# Patient Record
Sex: Male | Born: 1970 | Race: White | Hispanic: No | Marital: Married | Smoking: Current every day smoker
Health system: Southern US, Community
[De-identification: ages and names within clinical notes are randomized; demographics above are authoritative.]

## PROBLEM LIST (undated history)

## (undated) DIAGNOSIS — E785 Hyperlipidemia, unspecified: Secondary | ICD-10-CM

## (undated) DIAGNOSIS — Z720 Tobacco use: Secondary | ICD-10-CM

## (undated) DIAGNOSIS — I2510 Atherosclerotic heart disease of native coronary artery without angina pectoris: Secondary | ICD-10-CM

## (undated) DIAGNOSIS — G8929 Other chronic pain: Secondary | ICD-10-CM

## (undated) DIAGNOSIS — I255 Ischemic cardiomyopathy: Secondary | ICD-10-CM

## (undated) DIAGNOSIS — M1990 Unspecified osteoarthritis, unspecified site: Secondary | ICD-10-CM

## (undated) DIAGNOSIS — I739 Peripheral vascular disease, unspecified: Secondary | ICD-10-CM

## (undated) DIAGNOSIS — I10 Essential (primary) hypertension: Secondary | ICD-10-CM

## (undated) DIAGNOSIS — F319 Bipolar disorder, unspecified: Secondary | ICD-10-CM

## (undated) DIAGNOSIS — K279 Peptic ulcer, site unspecified, unspecified as acute or chronic, without hemorrhage or perforation: Secondary | ICD-10-CM

## (undated) DIAGNOSIS — F209 Schizophrenia, unspecified: Secondary | ICD-10-CM

## (undated) DIAGNOSIS — K219 Gastro-esophageal reflux disease without esophagitis: Secondary | ICD-10-CM

## (undated) DIAGNOSIS — M549 Dorsalgia, unspecified: Secondary | ICD-10-CM

## (undated) HISTORY — PX: HERNIA REPAIR: SHX51

## (undated) HISTORY — PX: TONSILLECTOMY: SUR1361

---

## 2008-06-25 ENCOUNTER — Ambulatory Visit: Payer: Self-pay | Admitting: Cardiology

## 2009-03-23 ENCOUNTER — Ambulatory Visit (HOSPITAL_COMMUNITY): Admission: RE | Admit: 2009-03-23 | Discharge: 2009-03-23 | Payer: Self-pay | Admitting: Neurosurgery

## 2009-07-12 ENCOUNTER — Other Ambulatory Visit: Payer: Self-pay | Admitting: Emergency Medicine

## 2009-07-13 ENCOUNTER — Inpatient Hospital Stay (HOSPITAL_COMMUNITY): Admission: RE | Admit: 2009-07-13 | Discharge: 2009-07-14 | Payer: Self-pay | Admitting: Psychiatry

## 2009-07-13 ENCOUNTER — Ambulatory Visit: Payer: Self-pay | Admitting: Psychiatry

## 2010-04-25 LAB — COMPREHENSIVE METABOLIC PANEL
AST: 22 U/L (ref 0–37)
Albumin: 3.1 g/dL — ABNORMAL LOW (ref 3.5–5.2)
BUN: 13 mg/dL (ref 6–23)
Calcium: 8.6 mg/dL (ref 8.4–10.5)
Creatinine, Ser: 1.27 mg/dL (ref 0.4–1.5)
GFR calc Af Amer: >60 mL/min (ref >60)
Glucose, Bld: 84 mg/dL (ref 70–99)
Total Bilirubin: 0.2 mg/dL — ABNORMAL LOW (ref 0.3–1.2)

## 2010-04-25 LAB — CBC
Hemoglobin: 13.9 g/dL (ref 13.0–17.0)
MCHC: 34.9 g/dL (ref 30.0–36.0)
MCV: 89.6 fL (ref 78.0–100.0)
Platelets: 189 10*3/uL (ref 150–400)
RBC: 4.45 MIL/uL (ref 4.22–5.81)
WBC: 5.1 10*3/uL (ref 4.0–10.5)

## 2010-04-25 LAB — DIFFERENTIAL
Eosinophils Relative: 3 % (ref 0–5)
Lymphocytes Relative: 42 % (ref 12–46)
Lymphs Abs: 2.2 10*3/uL (ref 0.7–4.0)
Monocytes Absolute: 0.5 10*3/uL (ref 0.1–1.0)
Monocytes Relative: 10 % (ref 3–12)

## 2010-04-25 LAB — RAPID URINE DRUG SCREEN, HOSP PERFORMED
Barbiturates: NOT DETECTED
Opiates: NOT DETECTED
Tetrahydrocannabinol: POSITIVE — AB

## 2013-02-16 ENCOUNTER — Emergency Department (HOSPITAL_COMMUNITY): Payer: Medicaid Other

## 2013-02-16 ENCOUNTER — Emergency Department (HOSPITAL_COMMUNITY)
Admission: EM | Admit: 2013-02-16 | Discharge: 2013-02-17 | Disposition: A | Discharge status: Home / Self Care | Payer: Medicaid Other | Attending: Emergency Medicine | Admitting: Emergency Medicine

## 2013-02-16 ENCOUNTER — Encounter (HOSPITAL_COMMUNITY): Payer: Self-pay | Admitting: Emergency Medicine

## 2013-02-16 DIAGNOSIS — F17200 Nicotine dependence, unspecified, uncomplicated: Secondary | ICD-10-CM | POA: Insufficient documentation

## 2013-02-16 DIAGNOSIS — I10 Essential (primary) hypertension: Secondary | ICD-10-CM | POA: Insufficient documentation

## 2013-02-16 DIAGNOSIS — R109 Unspecified abdominal pain: Secondary | ICD-10-CM

## 2013-02-16 DIAGNOSIS — R112 Nausea with vomiting, unspecified: Secondary | ICD-10-CM

## 2013-02-16 DIAGNOSIS — K279 Peptic ulcer, site unspecified, unspecified as acute or chronic, without hemorrhage or perforation: Secondary | ICD-10-CM

## 2013-02-16 DIAGNOSIS — Z8659 Personal history of other mental and behavioral disorders: Secondary | ICD-10-CM | POA: Insufficient documentation

## 2013-02-16 HISTORY — DX: Other chronic pain: M54.9

## 2013-02-16 HISTORY — DX: Other chronic pain: G89.29

## 2013-02-16 HISTORY — DX: Schizophrenia, unspecified: F20.9

## 2013-02-16 HISTORY — DX: Essential (primary) hypertension: I10

## 2013-02-16 HISTORY — DX: Bipolar disorder, unspecified: F31.9

## 2013-02-16 LAB — COMPREHENSIVE METABOLIC PANEL
ALBUMIN: 3.4 g/dL — AB (ref 3.5–5.2)
ALT: 32 U/L (ref 0–53)
AST: 21 U/L (ref 0–37)
Alkaline Phosphatase: 106 U/L (ref 39–117)
BILIRUBIN TOTAL: 0.3 mg/dL (ref 0.3–1.2)
BUN: 11 mg/dL (ref 6–23)
CALCIUM: 8.9 mg/dL (ref 8.4–10.5)
CHLORIDE: 103 meq/L (ref 96–112)
CO2: 24 meq/L (ref 19–32)
Creatinine, Ser: 1.08 mg/dL (ref 0.50–1.35)
GFR, EST NON AFRICAN AMERICAN: 84 mL/min — AB (ref >90)
Glucose, Bld: 114 mg/dL — ABNORMAL HIGH (ref 70–99)
Potassium: 3.9 mEq/L (ref 3.7–5.3)
SODIUM: 139 meq/L (ref 137–147)
TOTAL PROTEIN: 7 g/dL (ref 6.0–8.3)

## 2013-02-16 LAB — CBC WITH DIFFERENTIAL/PLATELET
BASOS ABS: 0 10*3/uL (ref 0.0–0.1)
BASOS PCT: 0 % (ref 0–1)
EOS ABS: 0.2 10*3/uL (ref 0.0–0.7)
EOS PCT: 2 % (ref 0–5)
HCT: 44.8 % (ref 39.0–52.0)
Hemoglobin: 15.4 g/dL (ref 13.0–17.0)
LYMPHS PCT: 13 % (ref 12–46)
Lymphs Abs: 1.7 10*3/uL (ref 0.7–4.0)
MCH: 31.3 pg (ref 26.0–34.0)
MCHC: 34.4 g/dL (ref 30.0–36.0)
MCV: 91.1 fL (ref 78.0–100.0)
MONO ABS: 0.7 10*3/uL (ref 0.1–1.0)
Monocytes Relative: 5 % (ref 3–12)
Neutro Abs: 10.3 10*3/uL — ABNORMAL HIGH (ref 1.7–7.7)
Neutrophils Relative %: 79 % — ABNORMAL HIGH (ref 43–77)
Platelets: 246 10*3/uL (ref 150–400)
RBC: 4.92 MIL/uL (ref 4.22–5.81)
RDW: 12.9 % (ref 11.5–15.5)
WBC: 12.9 10*3/uL — AB (ref 4.0–10.5)

## 2013-02-16 LAB — LIPASE, BLOOD: Lipase: 23 U/L (ref 11–59)

## 2013-02-16 MED ORDER — ONDANSETRON HCL 4 MG/2ML IJ SOLN
4.0000 mg | INTRAMUSCULAR | Status: DC | PRN
  Administered 2013-02-16: 4 mg via INTRAVENOUS
  Filled 2013-02-16: qty 2

## 2013-02-16 MED ORDER — SODIUM CHLORIDE 0.9 % IV BOLUS (SEPSIS)
500.0000 mL | Freq: Once | INTRAVENOUS | Status: AC
  Administered 2013-02-16: 500 mL via INTRAVENOUS

## 2013-02-16 MED ORDER — PANTOPRAZOLE SODIUM 40 MG IV SOLR
40.0000 mg | Freq: Once | INTRAVENOUS | Status: AC
  Administered 2013-02-16: 40 mg via INTRAVENOUS
  Filled 2013-02-16: qty 40

## 2013-02-16 MED ORDER — SODIUM CHLORIDE 0.9 % IV SOLN
INTRAVENOUS | Status: DC
  Administered 2013-02-16: 23:00:00 via INTRAVENOUS

## 2013-02-16 MED ORDER — FAMOTIDINE IN NACL 20-0.9 MG/50ML-% IV SOLN
20.0000 mg | Freq: Once | INTRAVENOUS | Status: AC
  Administered 2013-02-16: 20 mg via INTRAVENOUS
  Filled 2013-02-16: qty 50

## 2013-02-16 NOTE — ED Notes (Signed)
Patient complaining of abdominal pain and vomiting x 2-3 days. States vomiting "black looking" emesis today.

## 2013-02-16 NOTE — ED Provider Notes (Signed)
CSN: [id number]     Arrival date & time 02/16/13  2149 History   First MD Initiated Contact with Patient 02/16/13 2205     Chief Complaint  Patient presents with  . Abdominal Pain  . Emesis    HPI Pt was seen at 2205.  Per pt, c/o gradual onset and persistence of multiple intermittent episodes of N/V for the past 2 to 3 days. Has been associated with upper abd "pain." Describes the emesis as "black." Describes the abd pain as "aching." States he was evaluated at [facility], [place] ED today for same, "but they didn't do anything or tell me what I had wrong with me," so he came to [facility] ED tonight. Denies CP/SOB, no back pain, no fevers, no black or blood in stools, no red blood in emesis.     Past Medical History  Diagnosis Date  . Bipolar 1 disorder   . Schizophrenic disorder   . Hypertension   . Chronic back pain    Past Surgical History  Procedure Laterality Date  . Hernia repair      History  Substance Use Topics  . Smoking status: Current Every Day Smoker  . Smokeless tobacco: Not on file  . Alcohol Use: No    Review of Systems ROS: Statement: All systems negative except as marked or noted in the HPI; Constitutional: Negative for fever and chills. ; ; Eyes: Negative for eye pain, redness and discharge. ; ; ENMT: Negative for ear pain, hoarseness, nasal congestion, sinus pressure and sore throat. ; ; Cardiovascular: Negative for chest pain, palpitations, diaphoresis, dyspnea and peripheral edema. ; ; Respiratory: Negative for cough, wheezing and stridor. ; ; Gastrointestinal: +N/V, abd pain, "black" emesis. Negative for diarrhea, blood in stool, hematemesis, jaundice and rectal bleeding. . ; ; Genitourinary: Negative for dysuria, flank pain and hematuria. ; ; Musculoskeletal: Negative for back pain and neck pain. Negative for swelling and trauma.; ; Skin: Negative for pruritus, rash, abrasions, blisters, bruising and skin lesion.; ; Neuro: Negative for headache, lightheadedness and  neck stiffness. Negative for weakness, altered level of consciousness , altered mental status, extremity weakness, paresthesias, involuntary movement, seizure and syncope.      Allergies  Seroquel and Tylenol pm extra  Home Medications  No current outpatient prescriptions on file. BP 154/97  Pulse 119  Temp(Src) 97.5 F (36.4 C) (Oral)  Resp 20  Ht 5\' 7"  (1.702 m)  Wt 200 lb (90.719 kg)  BMI 31.32 kg/m2  SpO2 97% Physical Exam 2210: Physical examination:  Nursing notes reviewed; Vital signs and O2 SAT reviewed;  Constitutional: Well developed, Well nourished, Well hydrated, In no acute distress; Head:  Normocephalic, atraumatic; Eyes: EOMI, PERRL, No scleral icterus; ENMT: Mouth and pharynx normal, Mucous membranes moist; Neck: Supple, Full range of motion, No lymphadenopathy; Cardiovascular: Regular rate and rhythm, No murmur, rub, or gallop; Respiratory: Breath sounds clear & equal bilaterally, No wheezes. Speaking full sentences with ease, Normal respiratory effort/excursion; Chest: Nontender, Movement normal; Abdomen: Soft, +mild mid-epigastric tenderness to palp. No rebound or guarding. Nondistended, Normal bowel sounds. Rectal exam performed w/permission of pt and ED RN chaperone present.  Anal tone normal.  Non-tender, soft brown stool in rectal vault, heme neg.  No fissures, no external hemorrhoids, no palp masses.; Genitourinary: No CVA tenderness; Extremities: Pulses normal, No tenderness, No edema, No calf edema or asymmetry.; Neuro: AA&Ox3, Major CN grossly intact.  Speech clear. No gross focal motor or sensory deficits in extremities.; Skin: Color normal, Warm, Dry.  ED Course  Procedures  EKG Interpretation   None       MDM  MDM Reviewed: previous chart, nursing note and vitals Reviewed previous: labs Interpretation: labs and x-ray     Results for orders placed during the hospital encounter of 02/16/13  CBC WITH DIFFERENTIAL      Result Value Range   WBC 12.9  (*) 4.0 - 10.5 K/uL   RBC 4.92  4.22 - 5.81 MIL/uL   Hemoglobin 15.4  13.0 - 17.0 g/dL   HCT 04.5  40.9 - 81.1 %   MCV 91.1  78.0 - 100.0 fL   MCH 31.3  26.0 - 34.0 pg   MCHC 34.4  30.0 - 36.0 g/dL   RDW 91.4  78.2 - 95.6 %   Platelets 246  150 - 400 K/uL   Neutrophils Relative % 79 (*) 43 - 77 %   Neutro Abs 10.3 (*) 1.7 - 7.7 K/uL   Lymphocytes Relative 13  12 - 46 %   Lymphs Abs 1.7  0.7 - 4.0 K/uL   Monocytes Relative 5  3 - 12 %   Monocytes Absolute 0.7  0.1 - 1.0 K/uL   Eosinophils Relative 2  0 - 5 %   Eosinophils Absolute 0.2  0.0 - 0.7 K/uL   Basophils Relative 0  0 - 1 %   Basophils Absolute 0.0  0.0 - 0.1 K/uL  COMPREHENSIVE METABOLIC PANEL      Result Value Range   Sodium 139  137 - 147 mEq/L   Potassium 3.9  3.7 - 5.3 mEq/L   Chloride 103  96 - 112 mEq/L   CO2 24  19 - 32 mEq/L   Glucose, Bld 114 (*) 70 - 99 mg/dL   BUN 11  6 - 23 mg/dL   Creatinine, Ser 2.13  0.50 - 1.35 mg/dL   Calcium 8.9  8.4 - 08.6 mg/dL   Total Protein 7.0  6.0 - 8.3 g/dL   Albumin 3.4 (*) 3.5 - 5.2 g/dL   AST 21  0 - 37 U/L   ALT 32  0 - 53 U/L   Alkaline Phosphatase 106  39 - 117 U/L   Total Bilirubin 0.3  0.3 - 1.2 mg/dL   GFR calc non Af Amer 84 (*) >90 mL/min   GFR calc Af Amer >90  >90 mL/min  LIPASE, BLOOD      Result Value Range   Lipase 23  11 - 59 U/L   Dg Abd Acute W/chest 02/16/2013   CLINICAL DATA:  2-3 day history of abdominal pain and vomiting.  EXAM: ACUTE ABDOMEN SERIES (ABDOMEN 2 VIEW & CHEST 1 VIEW)  COMPARISON:  Two-view chest x-ray earlier same date [facility]. Acute abdomen series 09/19/2012 [facility].  FINDINGS: Bowel gas pattern unremarkable without evidence of obstruction or significant ileus. No evidence of free air or significant air-fluid levels on the erect image. Moderate stool burden. Phleboliths in the right side of the pelvis. No visible opaque urinary tract calculi. Visible psoas margins. Regional skeleton intact.   Suboptimal inspiration accounts for crowded bronchovascular markings diffusely and atelectasis in the bases, and accentuates the cardiac silhouette. Taking this into account, cardiomediastinal silhouette unremarkable. Lungs otherwise clear.  IMPRESSION: 1. No acute abdominal abnormality. 2. Suboptimal inspiration accounts for bibasilar atelectasis. No acute cardiopulmonary disease otherwise.   Electronically Signed   By: Hulan Saas M.D.   On: 02/16/2013 23:21    0015:  N/V x1 on arrival  to ED, dark emesis was heme positive. Stool in rectal vault was heme negative. IV pepcid and protonix given. IV zofran given for nausea. AXR without acute findings. H/H stable. Awaiting [facility] ED records from today for comparison. Will attempt PO fluid challenge. Sign out to Dr. Hyacinth MeekerMiller.    Laray AngerKathleen M Coumba Kellison, DO 02/17/13 0025

## 2013-02-17 ENCOUNTER — Emergency Department (HOSPITAL_COMMUNITY): Payer: Medicaid Other

## 2013-02-17 LAB — OCCULT BLOOD, POC DEVICE: Fecal Occult Bld: NEGATIVE

## 2013-02-17 MED ORDER — ONDANSETRON 4 MG PO TBDP: 4.0000 mg | ORAL_TABLET | Freq: Three times a day (TID) | ORAL | Status: DC | PRN

## 2013-02-17 MED ORDER — IOHEXOL 300 MG/ML  SOLN
50.0000 mL | Freq: Once | INTRAMUSCULAR | Status: AC | PRN
  Administered 2013-02-17: 50 mL via ORAL

## 2013-02-17 MED ORDER — PROMETHAZINE HCL 25 MG/ML IJ SOLN
25.0000 mg | Freq: Once | INTRAMUSCULAR | Status: AC
  Administered 2013-02-17: 25 mg via INTRAVENOUS
  Filled 2013-02-17: qty 1

## 2013-02-17 MED ORDER — RANITIDINE HCL 150 MG PO CAPS: 150.0000 mg | ORAL_CAPSULE | Freq: Two times a day (BID) | ORAL | Status: DC

## 2013-02-17 MED ORDER — IOHEXOL 300 MG/ML  SOLN
100.0000 mL | Freq: Once | INTRAMUSCULAR | Status: AC | PRN
  Administered 2013-02-17: 100 mL via INTRAVENOUS

## 2013-02-17 MED ORDER — MORPHINE SULFATE 4 MG/ML IJ SOLN
4.0000 mg | Freq: Once | INTRAMUSCULAR | Status: AC
  Administered 2013-02-17: 4 mg via INTRAVENOUS
  Filled 2013-02-17: qty 1

## 2013-02-17 MED ORDER — OMEPRAZOLE 20 MG PO CPDR: 20.0000 mg | DELAYED_RELEASE_CAPSULE | Freq: Every day | ORAL | Status: DC

## 2013-02-17 MED ORDER — SUCRALFATE 1 G PO TABS: 1.0000 g | ORAL_TABLET | Freq: Three times a day (TID) | ORAL | Status: DC

## 2013-02-17 NOTE — ED Provider Notes (Signed)
1:00 AM  Patient evaluated, change of shift, care accepted from Dr. Clarene DukeMcManus,  43 year old male, states that he has a history of peptic ulcer disease that was diagnosed clinically, he takes Zantac twice a day and avoid anti-inflammatory medications including aspirin. He also reports a distant history of blood clots both in his leg and his stomach, he is unsure of the circumstances surrounding this but notes that he was in the hospital for 2 weeks in [place] getting treatment for this. He no longer takes any anticoagulants. He has had several days of nausea and vomiting and has noticed a dark colored emesis, this appeared to be coffee grounds on arrival, the patient was given antiemetics and improved significantly and now has no nausea or vomiting. He has persistent epigastric pain and on my exam has tenderness in the epigastrium as well as mild less intense tenderness in the lower abdomen bilaterally. He has no guarding and no peritoneal signs. Laboratory workup shows a leukocytosis, LFTs and lipase are normal, CT scan of the abdomen and pelvis is pending to evaluate for other sources of this abdominal pain with a leukocytosis though it could just be peptic ulcer disease. At this time the patient appears hemodynamically stable and does not have a surgical abdomen.  1:30 AM  Medical records have now been accessed from the outside hospital. They have been faxed, I have reviewed the records which show that the patient was seen several times this week, initially for bronchitis type symptoms, given Zithromax, was having difficulty tolerating this medication because of vomiting. He was seen again earlier approximately 12 hours ago at that hospital and treated for nausea and vomiting.  Lab work at that time showed that he had a normal urinalysis, normal white blood cell count, normal metabolic panel without anion gap acidosis.  03:00 -   CT negative for acute findings - pt given phenergan for some recurrent  nausea - he has become very sleepy with the medications - gradually waking up - denies nausea or pain when asked.  Appears stable for d/c - will maximize antacid meds.  Records from OSH show that he has been taking a PPI, not zantac, will cover with PPI, H2 and carafate.  GI f/u - he states he doesn't have a GI specialist - states family doctor is Dr. Loney HeringBluth  6 AM, patient is awake, ambulatory, symptoms improved, home the following medications   Meds given in ED:    New Prescriptions   OMEPRAZOLE (PRILOSEC) 20 MG CAPSULE    Take 1 capsule (20 mg total) by mouth daily.   ONDANSETRON (ZOFRAN ODT) 4 MG DISINTEGRATING TABLET    Take 1 tablet (4 mg total) by mouth every 8 (eight) hours as needed for nausea.   RANITIDINE (ZANTAC) 150 MG CAPSULE    Take 1 capsule (150 mg total) by mouth 2 (two) times daily.   SUCRALFATE (CARAFATE) 1 G TABLET    Take 1 tablet (1 g total) by mouth 4 (four) times daily -  with meals and at bedtime.      Vida RollerBrian D Kenidy Crossland, MD 02/17/13 [phone]

## 2013-02-17 NOTE — Discharge Instructions (Signed)
Please call your doctor for a followup appointment within 24-48 hours. When you talk to your doctor please let them know that you were seen in the emergency department and have them acquire all of your records so that they can discuss the findings with you and formulate a treatment plan to fully care for your new and ongoing problems. ° °

## 2013-05-04 ENCOUNTER — Encounter (HOSPITAL_COMMUNITY): Payer: Self-pay | Admitting: Emergency Medicine

## 2013-05-04 ENCOUNTER — Emergency Department (HOSPITAL_COMMUNITY)
Admission: EM | Admit: 2013-05-04 | Discharge: 2013-05-04 | Disposition: A | Discharge status: Home / Self Care | Payer: Medicaid Other | Attending: Emergency Medicine | Admitting: Emergency Medicine

## 2013-05-04 DIAGNOSIS — F17200 Nicotine dependence, unspecified, uncomplicated: Secondary | ICD-10-CM | POA: Insufficient documentation

## 2013-05-04 DIAGNOSIS — S00251A Superficial foreign body of right eyelid and periocular area, initial encounter: Secondary | ICD-10-CM

## 2013-05-04 DIAGNOSIS — S0501XA Injury of conjunctiva and corneal abrasion without foreign body, right eye, initial encounter: Secondary | ICD-10-CM

## 2013-05-04 DIAGNOSIS — Y9289 Other specified places as the place of occurrence of the external cause: Secondary | ICD-10-CM | POA: Insufficient documentation

## 2013-05-04 DIAGNOSIS — IMO0002 Reserved for concepts with insufficient information to code with codable children: Secondary | ICD-10-CM | POA: Insufficient documentation

## 2013-05-04 DIAGNOSIS — I10 Essential (primary) hypertension: Secondary | ICD-10-CM | POA: Insufficient documentation

## 2013-05-04 DIAGNOSIS — G8929 Other chronic pain: Secondary | ICD-10-CM | POA: Insufficient documentation

## 2013-05-04 DIAGNOSIS — Y939 Activity, unspecified: Secondary | ICD-10-CM | POA: Insufficient documentation

## 2013-05-04 DIAGNOSIS — T1510XA Foreign body in conjunctival sac, unspecified eye, initial encounter: Secondary | ICD-10-CM | POA: Insufficient documentation

## 2013-05-04 DIAGNOSIS — S058X9A Other injuries of unspecified eye and orbit, initial encounter: Secondary | ICD-10-CM | POA: Insufficient documentation

## 2013-05-04 DIAGNOSIS — Z8659 Personal history of other mental and behavioral disorders: Secondary | ICD-10-CM | POA: Insufficient documentation

## 2013-05-04 DIAGNOSIS — Z79899 Other long term (current) drug therapy: Secondary | ICD-10-CM | POA: Insufficient documentation

## 2013-05-04 DIAGNOSIS — R51 Headache: Secondary | ICD-10-CM | POA: Insufficient documentation

## 2013-05-04 MED ORDER — HYDROCODONE-ACETAMINOPHEN 5-325 MG PO TABS: ORAL_TABLET | ORAL | Status: DC

## 2013-05-04 MED ORDER — FLUORESCEIN SODIUM 1 MG OP STRP
1.0000 | ORAL_STRIP | Freq: Once | OPHTHALMIC | Status: AC
  Administered 2013-05-04: 1 via OPHTHALMIC

## 2013-05-04 MED ORDER — TOBRAMYCIN 0.3 % OP SOLN
1.0000 [drp] | Freq: Once | OPHTHALMIC | Status: AC
  Administered 2013-05-04: 1 [drp] via OPHTHALMIC
  Filled 2013-05-04: qty 5

## 2013-05-04 MED ORDER — FLUORESCEIN SODIUM 1 MG OP STRP
ORAL_STRIP | OPHTHALMIC | Status: AC
  Administered 2013-05-04: 1 via OPHTHALMIC
  Filled 2013-05-04: qty 1

## 2013-05-04 MED ORDER — HYDROCODONE-ACETAMINOPHEN 5-325 MG PO TABS
1.0000 | ORAL_TABLET | Freq: Once | ORAL | Status: AC
  Administered 2013-05-04: 1 via ORAL
  Filled 2013-05-04: qty 1

## 2013-05-04 MED ORDER — TETRACAINE HCL 0.5 % OP SOLN
OPHTHALMIC | Status: AC
  Administered 2013-05-04: 4 [drp] via OPHTHALMIC
  Filled 2013-05-04: qty 2

## 2013-05-04 MED ORDER — KETOROLAC TROMETHAMINE 0.5 % OP SOLN
1.0000 [drp] | Freq: Once | OPHTHALMIC | Status: AC
  Administered 2013-05-04: 1 [drp] via OPHTHALMIC
  Filled 2013-05-04: qty 5

## 2013-05-04 MED ORDER — TETRACAINE HCL 0.5 % OP SOLN
4.0000 [drp] | Freq: Once | OPHTHALMIC | Status: AC
  Administered 2013-05-04: 4 [drp] via OPHTHALMIC

## 2013-05-04 NOTE — Discharge Instructions (Signed)
Corneal Abrasion The cornea is the clear covering at the front and center of the eye. When looking at the colored portion of the eye (iris), you are looking through the cornea. This very thin tissue is made up of many layers. The surface layer is a single layer of cells (corneal epithelium) and is one of the most sensitive tissues in the body. If a scratch or injury causes the corneal epithelium to come off, it is called a corneal abrasion. If the injury extends to the tissues below the epithelium, the condition is called a corneal ulcer. CAUSES   Scratches.  Trauma.  Foreign body in the eye. Some people have recurrences of abrasions in the area of the original injury even after it has healed (recurrent erosion syndrome). Recurrent erosion syndrome generally improves and goes away with time. SYMPTOMS   Eye pain.  Difficulty or inability to keep the injured eye open.  The eye becomes very sensitive to light.  Recurrent erosions tend to happen suddenly, first thing in the morning, usually after waking up and opening the eye. DIAGNOSIS  Your health care provider can diagnose a corneal abrasion during an eye exam. Dye is usually placed in the eye using a drop or a small paper strip moistened by your tears. When the eye is examined with a special light, the abrasion shows up clearly because of the dye. TREATMENT   Small abrasions may be treated with antibiotic drops or ointment alone.  Usually a pressure patch is specially applied. Pressure patches prevent the eye from blinking, allowing the corneal epithelium to heal. A pressure patch also reduces the amount of pain present in the eye during healing. Most corneal abrasions heal within 2 3 days with no effect on vision. If the abrasion becomes infected and spreads to the deeper tissues of the cornea, a corneal ulcer can result. This is serious because it can cause corneal scarring. Corneal scars interfere with light passing through the cornea  and cause a loss of vision in the involved eye. HOME CARE INSTRUCTIONS  Use medicine or ointment as directed. Only take over-the-counter or prescription medicines for pain, discomfort, or fever as directed by your health care provider.  Do not drive or operate machinery while your eye is patched. Your ability to judge distances is impaired.  If your health care provider has given you a follow-up appointment, it is very important to keep that appointment. Not keeping the appointment could result in a severe eye infection or permanent loss of vision. If there is any problem keeping the appointment, let your health care provider know. SEEK MEDICAL CARE IF:   You have pain, light sensitivity, and a scratchy feeling in one eye or both eyes.  Your pressure patch keeps loosening up, and you can blink your eye under the patch after treatment.  Any kind of discharge develops from the eye after treatment or if the lids stick together in the morning.  You have the same symptoms in the morning as you did with the original abrasion days, weeks, or months after the abrasion healed. MAKE SURE YOU:   Understand these instructions.  Will watch your condition.  Will get help right away if you are not doing well or get worse. Document Released: 01/21/2000 Document Revised: 11/13/2012 Document Reviewed: 09/30/2012 [facility] Patient Information 2014 [employer] [place].  Eye, Foreign Body A foreign body is an object that should not be there. The object could be near, on, or in the eye. HOME CARE If your  2014 [employer].  Eye, Foreign Body  A foreign body is an object that should not be there. The object could be near, on, or in the eye.  HOME CARE  If your doctor prescribes an eye patch:   Keep the eye patch on. Do this until you see your doctor again.   Do not remove the patch to put in medicine unless your doctor tells you.   Retape it as it was before:   When replacing the patch.   If the patch comes loose.   Do not drive or use machinery.   Only take medicine as told by your doctor.  If your doctor does not prescribe an eye patch:   Keep the eye closed as  much as possible.   Do not rub the eye.   Wear dark glasses in bright light.   Do not wear contact lenses until the eye feels normal, or as told by your doctor.   Wear protective eye covering, especially when using high speed tools.   Only take medicine as told by your doctor.  GET HELP RIGHT AWAY IF:    Your pain gets worse.   Your vision changes.   You have problems with the eye patch.   The injury gets larger.   There is fluid (discharge) coming from the eye.   You get puffiness (swelling) and soreness.   You have an oral temperature above 102 F (38.9 C), not controlled by medicine.   Your baby is older than 3 months with a rectal temperature of 102 F (38.9 C) or higher.   Your baby is 3 months old or younger with a rectal temperature of 100.4 F (38 C) or higher.  MAKE SURE YOU:    Understand these instructions.   Will watch your condition.   Will get help right away if you are not doing well or get worse.  Document Released: 07/13/2009 Document Revised: 04/17/2011 Document Reviewed: 07/13/2009  [employer] Patient Information 2014 [employer].

## 2013-05-04 NOTE — ED Notes (Signed)
Given pt eye pad and secured in place with paper tape per nursing communication order. nad noted. Pt tolerated well.

## 2013-05-04 NOTE — ED Notes (Signed)
Pt arrives with c/o of R eye pain and feeling as if eye is being "cut into" every time he blinks. Pain 8/10. Vision in R eye blurry. Pt states he was out in the woods on windy day. Washing out eye frequently, ineffective.

## 2013-05-04 NOTE — ED Provider Notes (Signed)
CSN: [id number]     Arrival date & time 05/04/13  [id number] History   This chart was scribed for non-physician practitioner Evolette Pendell L. Trisha Mangleriplett, PA-C working with Raeford RazorStephen Kohut, MD by Donne Anonayla Curran, ED Scribe. This patient was seen in room APA19/APA19 and the patient's care was started at 1017.   First MD Initiated Contact with Patient 05/04/13 1017     Chief Complaint  Patient presents with  . Eye Pain    The history is provided by the patient. No language interpreter was used.   HPI Comments: Don Fletcher is a 43 y.o. male who presents to the Emergency Department complaining of 1 day of a foreign body sensation to the center of his right upper eye described as a scratching sensation. He reports associated blurred vision and HA. He reports he was out int he woods on a windy day yesterday and it is possible that he got something in his eye, although he has not seen anything in it. He tried flushing his eye out with saline solution and water with no relief. He denies dizziness or any other symptoms. He does not wear glasses or contact lenses. He reports he is otherwise healthy and denies a hx of DM.   Past Medical History  Diagnosis Date  . Bipolar 1 disorder   . Schizophrenic disorder   . Hypertension   . Chronic back pain    Past Surgical History  Procedure Laterality Date  . Hernia repair     History reviewed. No pertinent family history. History  Substance Use Topics  . Smoking status: Current Every Day Smoker -- 1.00 packs/day    Types: Cigarettes  . Smokeless tobacco: Not on file  . Alcohol Use: No    Review of Systems  Constitutional: Negative for fever, chills and fatigue.  HENT: Negative for sore throat and trouble swallowing.   Eyes: Positive for pain, redness, itching and visual disturbance. Negative for photophobia and discharge.  Respiratory: Negative for cough and wheezing.   Cardiovascular: Negative for palpitations.  Gastrointestinal: Negative for nausea, vomiting  and blood in stool.  Genitourinary: Negative for dysuria, hematuria and flank pain.  Musculoskeletal: Negative for arthralgias, back pain, myalgias, neck pain and neck stiffness.  Skin: Negative for rash.  Neurological: Positive for headaches. Negative for dizziness, weakness and numbness.  Hematological: Does not bruise/bleed easily.  All other systems reviewed and are negative.      Allergies  Seroquel and Tylenol pm extra  Home Medications   Current Outpatient Rx  Name  Route  Sig  Dispense  Refill  . omeprazole (PRILOSEC) 20 MG capsule   Oral   Take 1 capsule (20 mg total) by mouth daily.   60 capsule   0   . ondansetron (ZOFRAN ODT) 4 MG disintegrating tablet   Oral   Take 1 tablet (4 mg total) by mouth every 8 (eight) hours as needed for nausea.   10 tablet   0   . ranitidine (ZANTAC) 150 MG capsule   Oral   Take 1 capsule (150 mg total) by mouth 2 (two) times daily.   60 capsule   0   . sucralfate (CARAFATE) 1 G tablet   Oral   Take 1 tablet (1 g total) by mouth 4 (four) times daily -  with meals and at bedtime.   60 tablet   0    BP 163/86  Pulse 84  Temp(Src) 97.8 F (36.6 C) (Oral)  Resp 18  Ht 5\' 8"  (  1.727 m)  Wt 190 lb (86.183 kg)  BMI 28.90 kg/m2  SpO2 98%  Physical Exam  Nursing note and vitals reviewed. Constitutional: He is oriented to person, place, and time. He appears well-developed and well-nourished. No distress.  HENT:  Head: Normocephalic and atraumatic.  Mouth/Throat: Oropharynx is clear and moist.  Eyes: EOM are normal. Pupils are equal, round, and reactive to light. Right eye exhibits no chemosis. Foreign body present in the right eye. Right conjunctiva is injected. Right conjunctiva has no hemorrhage.  Slit lamp exam:      The right eye shows corneal abrasion, foreign body and fluorescein uptake. The right eye shows no corneal ulcer.  Foreign body right upper eye lid.  Neck: Normal range of motion. Neck supple.   Cardiovascular: Normal rate, regular rhythm and normal heart sounds.   Pulmonary/Chest: Effort normal and breath sounds normal. No respiratory distress. He exhibits no tenderness.  Abdominal: Soft. He exhibits no distension. There is no tenderness.  Musculoskeletal: Normal range of motion. He exhibits no tenderness.  Lymphadenopathy:    He has no cervical adenopathy.  Neurological: He is alert and oriented to person, place, and time. He exhibits normal muscle tone. Coordination normal.  Skin: Skin is warm and dry.    ED Course  Procedures (including critical care time) DIAGNOSTIC STUDIES: Oxygen Saturation is 98% on RA, normal by my interpretation.    COORDINATION OF CARE: 10:18 AM Discussed treatment plan which includes fluorescein eye exam with pt at bedside and pt agreed to plan.   10:26 AM Fluorescein slit lamp eye exam performed. Patient has an embedded foreign body to the right upper eyelid that is removed by me with end of a cotton swab.    Labs Review Labs Reviewed - No data to display Imaging Review No results found.   EKG Interpretation None      MDM   Final diagnoses:  Foreign body of eyelid, right  Corneal abrasion, right   Visual Acuity EO Visual Acuity - Bilateral Distance: 20/40 ; R Distance: 20/30 ; L Distance: 20/25   Right eye is patched for comfort.  Pt advised to wear for 1-2 days.  Tobramycin and ketorolac drops dispensed.  Pt agrees to referral to Dr. Kennith Center for recheck.  Pt is feeling better and agrees to plan.    I personally performed the services described in this documentation, which was scribed in my presence. The recorded information has been reviewed and is accurate.    Lezly Rumpf L. Trisha Mangle, PA-C 05/05/13 2247

## 2013-05-04 NOTE — ED Notes (Signed)
Slit lamp, tetracaine, and fluorescein strip at bedside. PA aware.

## 2013-05-08 NOTE — ED Provider Notes (Signed)
Medical screening examination/treatment/procedure(s) were performed by non-physician practitioner and as supervising physician I was immediately available for consultation/collaboration.   EKG Interpretation None       Sean Malinowski, MD 05/08/13 0639 

## 2013-06-24 DIAGNOSIS — R079 Chest pain, unspecified: Secondary | ICD-10-CM | POA: Insufficient documentation

## 2013-07-19 ENCOUNTER — Emergency Department (HOSPITAL_COMMUNITY)
Admission: EM | Admit: 2013-07-19 | Discharge: 2013-07-19 | Disposition: A | Discharge status: Home / Self Care | Payer: Medicaid Other | Attending: Emergency Medicine | Admitting: Emergency Medicine

## 2013-07-19 ENCOUNTER — Encounter (HOSPITAL_COMMUNITY): Payer: Self-pay | Admitting: Emergency Medicine

## 2013-07-19 DIAGNOSIS — Z8659 Personal history of other mental and behavioral disorders: Secondary | ICD-10-CM | POA: Insufficient documentation

## 2013-07-19 DIAGNOSIS — F17200 Nicotine dependence, unspecified, uncomplicated: Secondary | ICD-10-CM | POA: Insufficient documentation

## 2013-07-19 DIAGNOSIS — M79672 Pain in left foot: Secondary | ICD-10-CM

## 2013-07-19 DIAGNOSIS — Z8669 Personal history of other diseases of the nervous system and sense organs: Secondary | ICD-10-CM | POA: Insufficient documentation

## 2013-07-19 DIAGNOSIS — I10 Essential (primary) hypertension: Secondary | ICD-10-CM | POA: Insufficient documentation

## 2013-07-19 DIAGNOSIS — M79609 Pain in unspecified limb: Secondary | ICD-10-CM | POA: Insufficient documentation

## 2013-07-19 DIAGNOSIS — IMO0001 Reserved for inherently not codable concepts without codable children: Secondary | ICD-10-CM | POA: Insufficient documentation

## 2013-07-19 DIAGNOSIS — Z79899 Other long term (current) drug therapy: Secondary | ICD-10-CM | POA: Insufficient documentation

## 2013-07-19 DIAGNOSIS — G8929 Other chronic pain: Secondary | ICD-10-CM | POA: Insufficient documentation

## 2013-07-19 DIAGNOSIS — R269 Unspecified abnormalities of gait and mobility: Secondary | ICD-10-CM | POA: Insufficient documentation

## 2013-07-19 HISTORY — DX: Peptic ulcer, site unspecified, unspecified as acute or chronic, without hemorrhage or perforation: K27.9

## 2013-07-19 NOTE — ED Provider Notes (Signed)
Pt tachycardia likely due to anxiety/pain, otherwise well appearing without other complaints  Joya Gaskinsonald W Meldon Hanzlik, MD 07/19/13 1645

## 2013-07-19 NOTE — ED Provider Notes (Signed)
CSN: [id number]     Arrival date & time 07/19/13  1454 History  This chart was scribed for Joya Gaskinsonald W Dontario Evetts, MD,  by Ashley JacobsBrittany Andrews, ED Scribe. The patient was seen in room APFT24/APFT24 and the patient's care was started at 3:10 PM.  First MD Initiated Contact with Patient 07/19/13 1509     Chief Complaint  Patient presents with  . Foot Pain      Patient is a 43 y.o. male presenting with lower extremity pain. The history is provided by the patient and medical records. No language interpreter was used.  Foot Pain   HPI Comments: Don Fletcher is a 43 y.o. male who presents to the Emergency Department complaining of constant, moderate, mid dorsal, left foot pain for the past 3-4 months and was much worse today. He reports the pain was unbearable with walking today while in a flea market. The pain is described as "deep". Pt is wearing flip flops. The pain is much worse with bearing weight or walking. Denies fever. Denies injury, fall, or trauma.  Pt was seen in [facility] but they were unable to find anything abnormal. Denies hx of plantar fascitis. Denies hx of DM.    Past Medical History  Diagnosis Date  . Bipolar 1 disorder   . Schizophrenic disorder   . Hypertension   . Chronic back pain   . Peptic ulcer disease    Past Surgical History  Procedure Laterality Date  . Hernia repair     History reviewed. No pertinent family history. History  Substance Use Topics  . Smoking status: Current Every Day Smoker -- 1.00 packs/day    Types: Cigarettes  . Smokeless tobacco: Not on file  . Alcohol Use: No    Review of Systems  Constitutional: Negative for fever.  Musculoskeletal: Positive for arthralgias, gait problem and myalgias.      Allergies  Seroquel and Tylenol pm extra  Home Medications   Prior to Admission medications   Medication Sig Start Date End Date Taking? Authorizing Provider  HYDROcodone-acetaminophen (NORCO/VICODIN) 5-325 MG per tablet Take one-two tabs  po q 4-6 hrs prn pain 05/04/13   Tammy L. Triplett, PA-C  omeprazole (PRILOSEC) 20 MG capsule Take 1 capsule (20 mg total) by mouth daily. 02/17/13   Vida RollerBrian D Miller, MD  ondansetron (ZOFRAN ODT) 4 MG disintegrating tablet Take 1 tablet (4 mg total) by mouth every 8 (eight) hours as needed for nausea. 02/17/13   Vida RollerBrian D Miller, MD  ranitidine (ZANTAC) 150 MG capsule Take 1 capsule (150 mg total) by mouth 2 (two) times daily. 02/17/13   Vida RollerBrian D Miller, MD  sucralfate (CARAFATE) 1 G tablet Take 1 tablet (1 g total) by mouth 4 (four) times daily -  with meals and at bedtime. 02/17/13   Vida RollerBrian D Miller, MD   BP 144/100  Pulse 124  Temp(Src) 97.8 F (36.6 C) (Oral)  Resp 18  Ht 5\' 8"  (1.727 m)  Wt 190 lb (86.183 kg)  BMI 28.90 kg/m2  SpO2 95% Physical Exam CONSTITUTIONAL: Well developed/well nourished HEAD: Normocephalic/atraumatic ENMT: Mucous membranes moist NECK: supple no meningeal signs CV: S1/S2 noted, no murmurs/rubs/gallops noted LUNGS: Lungs are clear to auscultation bilaterally, no apparent distress NEURO: Pt is awake/alert, moves all extremitiesx4 EXTREMITIES: pulses normal, full ROM, tenderness to heel of left foot, no bruising,erythema or puncture wound.  SKIN: warm, color normal PSYCH: no abnormalities of mood noted  ED Course  Procedures  DIAGNOSTIC STUDIES: Oxygen Saturation is 95% on room  air, normal by my interpretation.    COORDINATION OF CARE:  3:14 PM Discussed course of care with pt which includes post-op shoe. Pt understands and agrees.   Pt with foot pain for months, already had negative imagine at outside hospital No signs of infection etiology.  No puncture wounds noted.  There is no tenderness to dorsal aspect of foot.  Advised f/u with ortho  MDM   Final diagnoses:  Left foot pain    Nursing notes including past medical history and social history reviewed and considered in documentation   I personally performed the services described in this  documentation, which was scribed in my presence. The recorded information has been reviewed and is accurate.       Joya Gaskinsonald W Tychelle Purkey, MD 07/19/13 [phone]

## 2013-07-19 NOTE — ED Notes (Signed)
Pt c/o left heel pain; pt states he has been to his PCP and [facility] and has not received a diagnosis; pt states he is unable to put any pressure on his left heel

## 2013-12-16 ENCOUNTER — Encounter (HOSPITAL_COMMUNITY)
Admission: EM | Disposition: A | Discharge status: Home / Self Care | Payer: Medicaid Other | Source: Home / Self Care | Attending: Interventional Cardiology

## 2013-12-16 ENCOUNTER — Inpatient Hospital Stay (HOSPITAL_COMMUNITY)
Admission: EM | Admit: 2013-12-16 | Discharge: 2013-12-18 | DRG: 249 | Disposition: A | Discharge status: Home / Self Care | Payer: Medicaid Other | Attending: Interventional Cardiology | Admitting: Interventional Cardiology

## 2013-12-16 ENCOUNTER — Encounter (HOSPITAL_COMMUNITY): Payer: Self-pay | Admitting: *Deleted

## 2013-12-16 DIAGNOSIS — F17210 Nicotine dependence, cigarettes, uncomplicated: Secondary | ICD-10-CM | POA: Diagnosis present

## 2013-12-16 DIAGNOSIS — I213 ST elevation (STEMI) myocardial infarction of unspecified site: Secondary | ICD-10-CM | POA: Diagnosis present

## 2013-12-16 DIAGNOSIS — E785 Hyperlipidemia, unspecified: Secondary | ICD-10-CM | POA: Diagnosis present

## 2013-12-16 DIAGNOSIS — I2111 ST elevation (STEMI) myocardial infarction involving right coronary artery: Secondary | ICD-10-CM

## 2013-12-16 DIAGNOSIS — I2119 ST elevation (STEMI) myocardial infarction involving other coronary artery of inferior wall: Principal | ICD-10-CM | POA: Diagnosis present

## 2013-12-16 DIAGNOSIS — Z8249 Family history of ischemic heart disease and other diseases of the circulatory system: Secondary | ICD-10-CM | POA: Diagnosis not present

## 2013-12-16 DIAGNOSIS — F209 Schizophrenia, unspecified: Secondary | ICD-10-CM | POA: Diagnosis present

## 2013-12-16 DIAGNOSIS — I10 Essential (primary) hypertension: Secondary | ICD-10-CM | POA: Diagnosis present

## 2013-12-16 DIAGNOSIS — I2510 Atherosclerotic heart disease of native coronary artery without angina pectoris: Secondary | ICD-10-CM | POA: Diagnosis present

## 2013-12-16 DIAGNOSIS — F250 Schizoaffective disorder, bipolar type: Secondary | ICD-10-CM | POA: Diagnosis present

## 2013-12-16 DIAGNOSIS — Z9861 Coronary angioplasty status: Secondary | ICD-10-CM

## 2013-12-16 DIAGNOSIS — I2582 Chronic total occlusion of coronary artery: Secondary | ICD-10-CM | POA: Diagnosis present

## 2013-12-16 DIAGNOSIS — F319 Bipolar disorder, unspecified: Secondary | ICD-10-CM | POA: Diagnosis present

## 2013-12-16 DIAGNOSIS — Z720 Tobacco use: Secondary | ICD-10-CM | POA: Diagnosis present

## 2013-12-16 HISTORY — PX: LEFT HEART CATHETERIZATION WITH CORONARY ANGIOGRAM: SHX5451

## 2013-12-16 HISTORY — DX: Atherosclerotic heart disease of native coronary artery without angina pectoris: I25.10

## 2013-12-16 HISTORY — DX: Tobacco use: Z72.0

## 2013-12-16 HISTORY — DX: Hyperlipidemia, unspecified: E78.5

## 2013-12-16 HISTORY — PX: PERCUTANEOUS CORONARY STENT INTERVENTION (PCI-S): SHX5485

## 2013-12-16 LAB — CBC
HCT: 39.1 % (ref 39.0–52.0)
HEMATOCRIT: 42.6 % (ref 39.0–52.0)
HEMOGLOBIN: 13.1 g/dL (ref 13.0–17.0)
Hemoglobin: 14.8 g/dL (ref 13.0–17.0)
MCH: 30.8 pg (ref 26.0–34.0)
MCH: 30.5 pg (ref 26.0–34.0)
MCHC: 34.7 g/dL (ref 30.0–36.0)
MCHC: 33.5 g/dL (ref 30.0–36.0)
MCV: 88.6 fL (ref 78.0–100.0)
MCV: 91.1 fL (ref 78.0–100.0)
PLATELETS: 237 10*3/uL (ref 150–400)
Platelets: 248 10*3/uL (ref 150–400)
RBC: 4.81 MIL/uL (ref 4.22–5.81)
RBC: 4.29 MIL/uL (ref 4.22–5.81)
RDW: 12.7 % (ref 11.5–15.5)
RDW: 13 % (ref 11.5–15.5)
WBC: 9.8 10*3/uL (ref 4.0–10.5)
WBC: 9.5 10*3/uL (ref 4.0–10.5)

## 2013-12-16 LAB — BASIC METABOLIC PANEL
ANION GAP: 14 (ref 5–15)
BUN: 10 mg/dL (ref 6–23)
CALCIUM: 9.1 mg/dL (ref 8.4–10.5)
CO2: 24 mEq/L (ref 19–32)
Chloride: 101 mEq/L (ref 96–112)
Creatinine, Ser: 1.11 mg/dL (ref 0.50–1.35)
GFR, EST NON AFRICAN AMERICAN: 80 mL/min — AB (ref >90)
Glucose, Bld: 175 mg/dL — ABNORMAL HIGH (ref 70–99)
Potassium: 4.1 mEq/L (ref 3.7–5.3)
SODIUM: 139 meq/L (ref 137–147)

## 2013-12-16 LAB — DIFFERENTIAL
BASOS ABS: 0 10*3/uL (ref 0.0–0.1)
Basophils Relative: 0 % (ref 0–1)
Eosinophils Absolute: 0.3 10*3/uL (ref 0.0–0.7)
Eosinophils Relative: 3 % (ref 0–5)
LYMPHS PCT: 36 % (ref 12–46)
Lymphs Abs: 3.5 10*3/uL (ref 0.7–4.0)
MONO ABS: 0.8 10*3/uL (ref 0.1–1.0)
Monocytes Relative: 8 % (ref 3–12)
NEUTROS ABS: 5.2 10*3/uL (ref 1.7–7.7)
NEUTROS PCT: 53 % (ref 43–77)

## 2013-12-16 LAB — POCT I-STAT, CHEM 8
BUN: 8 mg/dL (ref 6–23)
CHLORIDE: 105 meq/L (ref 96–112)
Calcium, Ion: 1.17 mmol/L (ref 1.12–1.23)
Creatinine, Ser: 0.9 mg/dL (ref 0.50–1.35)
Glucose, Bld: 160 mg/dL — ABNORMAL HIGH (ref 70–99)
HCT: 41 % (ref 39.0–52.0)
Hemoglobin: 13.9 g/dL (ref 13.0–17.0)
Potassium: 3.1 mEq/L — ABNORMAL LOW (ref 3.7–5.3)
SODIUM: 140 meq/L (ref 137–147)
TCO2: 19 mmol/L (ref 0–100)

## 2013-12-16 LAB — POCT ACTIVATED CLOTTING TIME: Activated Clotting Time: 394 seconds

## 2013-12-16 LAB — APTT: APTT: 24 s (ref 24–37)

## 2013-12-16 LAB — PROTIME-INR
INR: 0.99 (ref 0.00–1.49)
Prothrombin Time: 13.2 seconds (ref 11.6–15.2)

## 2013-12-16 LAB — MRSA PCR SCREENING: MRSA by PCR: NEGATIVE

## 2013-12-16 LAB — TROPONIN I
Troponin I: >20 ng/mL (ref <0.30)
Troponin I: >20 ng/mL (ref <0.30)

## 2013-12-16 SURGERY — LEFT HEART CATHETERIZATION WITH CORONARY ANGIOGRAM
Anesthesia: LOCAL

## 2013-12-16 MED ORDER — METOPROLOL TARTRATE 12.5 MG HALF TABLET
12.5000 mg | ORAL_TABLET | Freq: Two times a day (BID) | ORAL | Status: DC
  Administered 2013-12-16 (×2): 12.5 mg via ORAL
  Filled 2013-12-16 (×4): qty 1

## 2013-12-16 MED ORDER — VERAPAMIL HCL 2.5 MG/ML IV SOLN
INTRAVENOUS | Status: AC
  Filled 2013-12-16: qty 2

## 2013-12-16 MED ORDER — ASPIRIN 81 MG PO CHEW
81.0000 mg | CHEWABLE_TABLET | Freq: Every day | ORAL | Status: DC
  Administered 2013-12-16 – 2013-12-17 (×2): 81 mg via ORAL
  Filled 2013-12-16 (×2): qty 1

## 2013-12-16 MED ORDER — HEPARIN (PORCINE) IN NACL 2-0.9 UNIT/ML-% IJ SOLN
INTRAMUSCULAR | Status: AC
Start: 2013-12-16 — End: 2013-12-16
  Filled 2013-12-16: qty 1500

## 2013-12-16 MED ORDER — HEPARIN SODIUM (PORCINE) 5000 UNIT/ML IJ SOLN
INTRAMUSCULAR | Status: AC
  Filled 2013-12-16: qty 1

## 2013-12-16 MED ORDER — HEPARIN SODIUM (PORCINE) 5000 UNIT/ML IJ SOLN
4000.0000 [IU] | Freq: Once | INTRAMUSCULAR | Status: AC
  Administered 2013-12-16: 4000 [IU] via INTRAVENOUS

## 2013-12-16 MED ORDER — DIAZEPAM 5 MG PO TABS
5.0000 mg | ORAL_TABLET | Freq: Two times a day (BID) | ORAL | Status: DC | PRN
  Administered 2013-12-17 (×2): 5 mg via ORAL
  Filled 2013-12-16 (×2): qty 1

## 2013-12-16 MED ORDER — ONDANSETRON HCL 4 MG/2ML IJ SOLN
4.0000 mg | Freq: Four times a day (QID) | INTRAMUSCULAR | Status: DC | PRN
  Administered 2013-12-16: 4 mg via INTRAVENOUS
  Filled 2013-12-16: qty 2

## 2013-12-16 MED ORDER — TICAGRELOR 90 MG PO TABS
ORAL_TABLET | ORAL | Status: AC
  Filled 2013-12-16: qty 2

## 2013-12-16 MED ORDER — LIDOCAINE HCL (PF) 1 % IJ SOLN
INTRAMUSCULAR | Status: AC
  Filled 2013-12-16: qty 30

## 2013-12-16 MED ORDER — HEPARIN SODIUM (PORCINE) 5000 UNIT/ML IJ SOLN
5000.0000 [IU] | Freq: Three times a day (TID) | INTRAMUSCULAR | Status: DC
  Administered 2013-12-16 – 2013-12-17 (×4): 5000 [IU] via SUBCUTANEOUS
  Filled 2013-12-16 (×9): qty 1

## 2013-12-16 MED ORDER — NICOTINE 21 MG/24HR TD PT24
21.0000 mg | MEDICATED_PATCH | Freq: Every day | TRANSDERMAL | Status: DC
  Administered 2013-12-16 – 2013-12-17 (×2): 21 mg via TRANSDERMAL
  Filled 2013-12-16 (×3): qty 1

## 2013-12-16 MED ORDER — MIDAZOLAM HCL 2 MG/2ML IJ SOLN
INTRAMUSCULAR | Status: AC
  Filled 2013-12-16: qty 2

## 2013-12-16 MED ORDER — NITROGLYCERIN 1 MG/10 ML FOR IR/CATH LAB
INTRA_ARTERIAL | Status: AC
  Filled 2013-12-16: qty 10

## 2013-12-16 MED ORDER — BIVALIRUDIN 250 MG IV SOLR
INTRAVENOUS | Status: AC
  Filled 2013-12-16: qty 250

## 2013-12-16 MED ORDER — TICAGRELOR 90 MG PO TABS
90.0000 mg | ORAL_TABLET | Freq: Two times a day (BID) | ORAL | Status: DC
  Administered 2013-12-16: 90 mg via ORAL
  Filled 2013-12-16 (×3): qty 1

## 2013-12-16 MED ORDER — ATORVASTATIN CALCIUM 80 MG PO TABS
80.0000 mg | ORAL_TABLET | Freq: Every day | ORAL | Status: DC
  Administered 2013-12-16 – 2013-12-17 (×2): 80 mg via ORAL
  Filled 2013-12-16 (×4): qty 1

## 2013-12-16 MED ORDER — SUCRALFATE 1 G PO TABS
1.0000 g | ORAL_TABLET | Freq: Three times a day (TID) | ORAL | Status: DC
  Administered 2013-12-16 – 2013-12-18 (×5): 1 g via ORAL
  Filled 2013-12-16 (×13): qty 1

## 2013-12-16 MED ORDER — TICAGRELOR 90 MG PO TABS
90.0000 mg | ORAL_TABLET | Freq: Two times a day (BID) | ORAL | Status: DC
  Filled 2013-12-16 (×2): qty 1

## 2013-12-16 MED ORDER — SODIUM CHLORIDE 0.9 % IV SOLN
1.0000 mL/kg/h | INTRAVENOUS | Status: AC
Start: 2013-12-16 — End: 2013-12-16
  Administered 2013-12-16: 1 mL/kg/h via INTRAVENOUS

## 2013-12-16 MED ORDER — ACETAMINOPHEN 325 MG PO TABS
650.0000 mg | ORAL_TABLET | ORAL | Status: DC | PRN
Start: 2013-12-16 — End: 2013-12-18
  Administered 2013-12-16: 650 mg via ORAL
  Filled 2013-12-16: qty 2

## 2013-12-16 MED ORDER — FAMOTIDINE 10 MG PO TABS
10.0000 mg | ORAL_TABLET | Freq: Two times a day (BID) | ORAL | Status: DC
  Administered 2013-12-16 – 2013-12-17 (×4): 10 mg via ORAL
  Filled 2013-12-16 (×6): qty 1

## 2013-12-16 NOTE — ED Notes (Signed)
Pt transported to cath lab.  

## 2013-12-16 NOTE — Plan of Care (Signed)
Problem: Consults Goal: Tobacco Cessation referral if indicated Outcome: Completed/Met Date Met:  12/16/13  Problem: Phase I Progression Outcomes Goal: Anginal pain relieved Outcome: Completed/Met Date Met:  12/16/13 Goal: Aspirin unless contraindicated Outcome: Completed/Met Date Met:  12/16/13 Goal: Voiding-avoid urinary catheter unless indicated Outcome: Completed/Met Date Met:  12/16/13 Goal: Hemodynamically stable Outcome: Completed/Met Date Met:  12/16/13 Goal: Vascular site scale level 0 - I Vascular Site Scale Level 0: No bruising/bleeding/hematoma Level I (Mild): Bruising/Ecchymosis, minimal bleeding/ooozing, palpable hematoma < 3 cm Level II (Moderate): Bleeding not affecting hemodynamic parameters, pseudoaneurysm, palpable hematoma > 3 cm Level III (Severe) Bleeding which affects hemodynamic parameters or retroperitoneal hemorrhage  Outcome: Completed/Met Date Met:  12/16/13

## 2013-12-16 NOTE — ED Notes (Signed)
Cardiology MD at bedside.

## 2013-12-16 NOTE — ED Provider Notes (Signed)
CSN: [id number]     Arrival date & time 12/16/13  0319 History   First MD Initiated Contact with Patient 12/16/13 [phone]     Chief Complaint  Patient presents with  . Code STEMI     (Consider location/radiation/quality/duration/timing/severity/associated sxs/prior Treatment) HPI 43 yo male presents to the ER via EMS as a code STEMI.  Pt reports about an hour ago he had onset of heavy chest pressure, nausea, diaphoresis.  EMS reports pt had gray color upon their arrival.  Pt received NTG x 1, dropped his pressure and then improved with 250 ns bolus.  Pt also received 325 mg aspirin.  Pt denies prior h/o heart problems.  History of HTN.  Strong family history of MI in 6940's among brothers, father.  Pt is a pack a day smoker.  Pt recently drove 19 hours straight, reports feeling wiped out.   Past Medical History  Diagnosis Date  . Bipolar 1 disorder   . Schizophrenic disorder   . Hypertension   . Chronic back pain   . Peptic ulcer disease    Past Surgical History  Procedure Laterality Date  . Hernia repair     No family history on file. History  Substance Use Topics  . Smoking status: Current Every Day Smoker -- 1.00 packs/day    Types: Cigarettes  . Smokeless tobacco: Not on file  . Alcohol Use: No    Review of Systems  All other systems reviewed and are negative.     Allergies  Seroquel and Tylenol pm extra  Home Medications   Prior to Admission medications   Medication Sig Start Date End Date Taking? Authorizing Provider  omeprazole (PRILOSEC) 20 MG capsule Take 1 capsule (20 mg total) by mouth daily. 02/17/13   Vida RollerBrian D Miller, MD  ranitidine (ZANTAC) 150 MG capsule Take 1 capsule (150 mg total) by mouth 2 (two) times daily. 02/17/13   Vida RollerBrian D Miller, MD  sucralfate (CARAFATE) 1 G tablet Take 1 tablet (1 g total) by mouth 4 (four) times daily -  with meals and at bedtime. 02/17/13   Vida RollerBrian D Miller, MD   There were no vitals taken for this visit. Physical Exam   Constitutional: He is oriented to person, place, and time. He appears well-developed and well-nourished. He appears distressed.  HENT:  Head: Normocephalic and atraumatic.  Nose: Nose normal.  Mouth/Throat: Oropharynx is clear and moist.  Eyes: Conjunctivae and EOM are normal. Pupils are equal, round, and reactive to light.  Neck: Normal range of motion. Neck supple. No JVD present. No tracheal deviation present. No thyromegaly present.  Cardiovascular: Normal rate, regular rhythm, normal heart sounds and intact distal pulses.  Exam reveals no gallop and no friction rub.   No murmur heard. Pulmonary/Chest: Effort normal and breath sounds normal. No stridor. No respiratory distress. He has no wheezes. He has no rales. He exhibits no tenderness.  Abdominal: Soft. Bowel sounds are normal. He exhibits no distension and no mass. There is no tenderness. There is no rebound and no guarding.  Musculoskeletal: Normal range of motion. He exhibits no edema or tenderness.  Lymphadenopathy:    He has no cervical adenopathy.  Neurological: He is alert and oriented to person, place, and time. He displays normal reflexes. He exhibits normal muscle tone. Coordination normal.  Skin: Skin is warm. No rash noted. He is diaphoretic. No erythema. No pallor.  Psychiatric: He has a normal mood and affect. His behavior is normal. Judgment and thought content normal.  Nursing note and vitals reviewed.  CRITICAL CARE Performed by: Olivia MackieTTER,Blondine Hottel M Total critical care time: 30 min Critical care time was exclusive of separately billable procedures and treating other patients. Critical care was necessary to treat or prevent imminent or life-threatening deterioration. Critical care was time spent personally by me on the following activities: development of treatment plan with patient and/or surrogate as well as nursing, discussions with consultants, evaluation of patient's response to treatment, examination of patient,  obtaining history from patient or surrogate, ordering and performing treatments and interventions, ordering and review of laboratory studies, ordering and review of radiographic studies, pulse oximetry and re-evaluation of patient's condition.   ED Course  Procedures (including critical care time) Labs Review Labs Reviewed - No data to display  Imaging Review No results found.   EKG Interpretation None      MDM   Final diagnoses:  ST elevation myocardial infarction (STEMI), unspecified artery    43 yo male with acute STEMI.  To go emergently to the cath lab.    Olivia Mackielga M Emanuella Nickle, MD 12/16/13 [phone]

## 2013-12-16 NOTE — Progress Notes (Signed)
Dr Herbie BaltimoreHarding aware of elevated troponin

## 2013-12-16 NOTE — Progress Notes (Signed)
   43 year old young man with family history of coronary disease as well as hypertension and hyperlipidemia and long-term smoker presented with an inferolateral MI found to have an occluded proximal RPL branch treated with a bare metal stent.  Subjective:  Resting comfortably. Joint is stable.  Objective:  Vital Signs in the last 24 hours: Temp:  [97.5 F (36.4 C)-98 F (36.7 C)] 98 F (36.7 C) (11/10 1200) Pulse Rate:  [72-89] 89 (11/10 1300) Resp:  [14-24] 18 (11/10 1300) BP: (93-142)/(59-96) 119/86 mmHg (11/10 1300) SpO2:  [96 %-100 %] 100 % (11/10 1300) Weight:  [198 lb 4.8 oz (89.948 kg)-200 lb (90.719 kg)] 198 lb 4.8 oz (89.948 kg) (11/10 0533)  Intake/Output from previous day: 11/09 0701 - 11/10 0700 In: 140.9 [I.V.:140.9] Out: 350 [Urine:350] Intake/Output from this shift: Total I/O In: 1026 [P.O.:480; I.V.:546] Out: 1100 [Urine:1100]  Physical Exam: General appearance: alert, cooperative, appears stated age and no distress Neck: no adenopathy, no carotid bruit, no JVD and supple, symmetrical, trachea midline Lungs: clear to auscultation bilaterally and normal percussion bilaterally Heart: regular rate and rhythm, S1, S2 normal, no murmur, click, rub or gallop Abdomen: soft, non-tender; bowel sounds normal; no masses,  no organomegaly Extremities: extremities normal, atraumatic, no cyanosis or edema and groin cath site C/D/I Pulses: 2+ and symmetric Neurologic: Grossly normal  Lab Results:  Recent Labs  12/16/13 0330 12/16/13 1040  WBC 9.8 9.5  HGB 14.8 13.1  PLT 248 237    Recent Labs  12/16/13 0330  NA 139  K 4.1  CL 101  CO2 24  GLUCOSE 175*  BUN 10  CREATININE 1.11    Recent Labs  12/16/13 0330 12/16/13 1040  TROPONINI <0.30 >20.00*   Hepatic Function Panel No results for input(s): PROT, ALBUMIN, AST, ALT, ALKPHOS, BILITOT, BILIDIR, IBILI in the last 72 hours. No results for input(s): CHOL in the last 72 hours. No results for input(s):  PROTIME in the last 72 hours.  Imaging:  Chest x-ray shows suboptimal inspiration with mild bibasilar atelectasis but no acute cardio pulmonary process.  Cardiac Studies:  Cardiac Cath: occluded RPL - treated with vision BMS 2.0 mm x 23 mm (2.25 mm); EF roughly 55%, LVEDP 17 mmHg.  Telemetry with occasional PVCs  Assessment/Plan:  Principal Problem:   Acute MI, inferior wall, initial episode of care Active Problems:   CAD S/P PCI:  2.0 x 23 vision bare metal stent (2.25 mm) RPL   Smoker   Essential hypertension   Hyperlipidemia with target LDL less than 70   Schizophrenic disorder   Bipolar 1 disorder   Family history of premature CAD    Status post bare metal stent PCI. Currently on aspirin plus Brilinta --> With BMS, Brilinta is not mandatory for the first year, however with ACS presentation, and antiplatelet agent would be recommended for at least one year.  Smoking cessation counseling provided - on NicoDerm patch  Statin added  Blood pressure stable on low-dose beta blocker. Would need to monitor pressures for 24 hours to determine if we can add an ARB or ACE inhibitor. He is only on low-dose beta blocker for now.  We need to get his home medication regimen to understand his antipsychotic and other psychotropic medications    LOS: 0 days    Dorlene Footman W 12/16/2013, 1:43 PM

## 2013-12-16 NOTE — ED Notes (Signed)
Cath lab ready. Cardiology MD aware

## 2013-12-16 NOTE — H&P (Signed)
Patient ID: Don Fletcher MRN: [id number], DOB/AGE: [birth year]   Admit date: 12/16/2013   Primary Physician: Ernestine ConradBLUTH, KIRK, MD Primary Cardiologist: None  Pt. Profile:  [name] smoker (1PPD) with bipolar disorder, HTN, HLD, "leg clots," GI bleed who presents with inferior STEMI.   Problem List  Past Medical History  Diagnosis Date  . Bipolar 1 disorder   . Schizophrenic disorder   . Hypertension   . Chronic back pain   . Peptic ulcer disease     Past Surgical History  Procedure Laterality Date  . Hernia repair       Allergies  Allergies  Allergen Reactions  . Seroquel [Quetiapine] Other (See Comments)    "makes my legs cramp"  . Tylenol Pm Extra [Diphenhydramine-Apap (Sleep)] Other (See Comments)    "makes my leg cramp"    HPI  [name] smoker (1PPD) with bipolar disorder, HTN, HLD, "leg clots," GI bleed who presents with inferior STEMI.   The patient states that this morning while playing a video game he developed severe SSCP with associated nausea and shortness of breath. 10/10 in severity. Had never experienced these symptoms before. EMS was called and he was found to have 1-32mm STE in II, III, and aVF with subtle elevation in V4-V6 and reciprocal depressions in aVL and V2. He was given 250cc NS, aspirin, and SL NTG en route. The cath lab was activated prior to arrival to the ED.  On arrival to the ED he was hemodynamically stable and continued to report 10/10 pain. ECG demonstrated persistent inferolateral STE and he was given 4,000u of heparin and emergently brought to the cath lab.  He was found to have a totally occluded distal RCA which was treated with a BMS.  V-gram demonstrated normal EF with HK of the basal inferior wall. He was loaded with ticagrelor in the lab.     Of note, he reports that he often forgets to take his medications. No upcoming surgeries. Not currently on anticoagulation. No history of stroke.   Home Medications The patient cannot remember his  medications. States he is on valium, an antihypertensive, and something for stomach ulcers.   Family History  The patient reports he has brothers and a father who have had MIs in their 2040s.   Social History  History   Social History  . Marital Status: Married    Spouse Name: N/A    Number of Children: N/A  . Years of Education: N/A   Occupational History  . Not on file.   Social History Main Topics  . Smoking status: Current Every Day Smoker -- 1.00 packs/day    Types: Cigarettes  . Smokeless tobacco: Not on file  . Alcohol Use: No  . Drug Use: Yes    Special: Marijuana  . Sexual Activity: Not on file   Other Topics Concern  . Not on file   Social History Narrative     Review of Systems General:  No chills, fever, night sweats or weight changes.  Cardiovascular:  See HPI Dermatological: No rash, lesions/masses Respiratory: No cough, dyspnea Urologic: No hematuria, dysuria Abdominal:   No vomiting, diarrhea, bright red blood per rectum, melena, or hematemesis. He does have a distant history of GIB ~ 3 years ago.  Neurologic:  No visual changes, wkns, changes in mental status. All other systems reviewed and are otherwise negative except as noted above.  Physical Exam  Blood pressure 138/77, pulse 87, temperature 97.8 F (36.6 C), temperature source Temporal, resp.  rate 16, height 5\' 8"  (1.727 m), weight 90.719 kg (200 lb), SpO2 100 %.  General: Moderate distress Psych: Normal affect. Neuro: Alert and oriented X 3. Moves all extremities spontaneously. HEENT: Normal  Neck: Supple without bruits or JVD. Lungs:  Resp regular and unlabored, CTA. Heart: RRR no s3, s4, or murmurs. Abdomen: Soft, non-tender, non-distended, BS + x 4.  Extremities: No clubbing, cyanosis or edema. DP/PT/Radials 2+ and equal bilaterally.  Labs  Troponin (Point of Care Test) No results for input(s): TROPIPOC in the last 72 hours. No results for input(s): CKTOTAL, CKMB, TROPONINI in the  last 72 hours. Lab Results  Component Value Date   WBC 12.9* 02/16/2013   HGB 15.4 02/16/2013   HCT 44.8 02/16/2013   MCV 91.1 02/16/2013   PLT 246 02/16/2013   No results for input(s): NA, K, CL, CO2, BUN, CREATININE, CALCIUM, PROT, BILITOT, ALKPHOS, ALT, AST, GLUCOSE in the last 168 hours.  Invalid input(s): LABALBU No results found for: CHOL, HDL, LDLCALC, TRIG No results found for: DDIMER   Radiology/Studies  No results found.  ECG  Inferolateral STE with reciprocal changes in aVL and V2.   ASSESSMENT AND PLAN  89M smoker (1PPD) with bipolar disorder, HTN, HLD, "leg clots," GI bleed who presents with inferior STEMI. He is s/p cardiac cath with BMS to distal RCA. V-gram demonstrated normal EF with HK of the basal inferior wall. He was loaded with ticagrelor in the lab.   1. Ticagrelor 90mg  BID 2. ASA 81mg  3. Metoprolol 12.5mg  BID 4. Atorvastatin 80mg   5. Nicotine patch 6. Smoking cessation counseling    Signed, Glori LuisFRIEDMAN, Quita Mcgrory, MD 12/16/2013, 3:35 AM

## 2013-12-16 NOTE — Care Management Note (Addendum)
    Page 1 of 1   12/17/2013     9:54:15 AM CARE MANAGEMENT NOTE 12/17/2013  Patient:  Tawni CarnesGRACE,Srinivas D   Account Number:  [id number]  Date Initiated:  12/16/2013  Documentation initiated by:  Junius CreamerWELL,DEBBIE  Subjective/Objective Assessment:   adm w stemi     Action/Plan:   lives w wife, pcp dr Kirtland Bouchardk bluth   Anticipated DC Date:     Anticipated DC Plan:  HOME/SELF CARE      DC Planning Services  CM consult  Medication Assistance      Choice offered to / List presented to:             Status of service:   Medicare Important Message given?   (If response is "NO", the following Medicare IM given date fields will be blank) Date Medicare IM given:   Medicare IM given by:   Date Additional Medicare IM given:   Additional Medicare IM given by:    Discharge Disposition:  HOME/SELF CARE  Per UR Regulation:  Reviewed for med. necessity/level of care/duration of stay  If discussed at Long Length of Stay Meetings, dates discussed:    Comments:  11/11  0953 debbie Emanuelle Hammerstrom rn.bsn gave pt 30day free effient card and pt has medicaid for meds so copay aound 3.00 per month.  11/10 1047a debbie Jeanet Lupe rn,bsn left pt 30day free brilinta card. pt has medicaid for meds so will have low copay of 3.00 per month.

## 2013-12-16 NOTE — Progress Notes (Signed)
eLink Physician-Brief Progress Note Patient Name: Don CarnesSteven D Fletcher DOB: 1970/04/24 MRN: [id number]   Date of Service  12/16/2013  HPI/Events of Note  [age] M PPD smoker/HTN/HLD presenting to ED with Code STEMI - inferior now s/p cath lab with bare metal stent to proximal RCA.  Patient is HD stable with sats of 97%  eICU Interventions  Plan: Care per Cardiology Will continue to monitor     Intervention Category Evaluation Type: New Patient Evaluation  DETERDING,ELIZABETH 12/16/2013, 5:14 AM

## 2013-12-16 NOTE — CV Procedure (Signed)
PROCEDURE:  Left heart catheterization with selective coronary angiography, left ventriculogram. Aspiration thrombectomy PLA; PCI PLA.  INDICATIONS:  Inferior STEMI  The risks, benefits, and details of the procedure were explained to the patient.  The patient verbalized understanding and wanted to proceed.  Emergency consent was obtained.  PROCEDURE TECHNIQUE:  After Xylocaine anesthesia, right radial access was attempted.  The wire could not be passed with two attempts. After Xylocaine anesthesia, a 14F sheath was placed in the right femoral artery with a single anterior needle wall stick.   Left coronary angiography was done using a Judkins L4 guide catheter.  Right coronary angiography was done using a Judkins R4 guide catheter.  Left ventriculography was done using a pigtail catheter. Angioseal was deployed for hemostasis.    CONTRAST:  Total of 145 cc.  COMPLICATIONS:  None.    HEMODYNAMICS:  Aortic pressure was 91/60; LV pressure was 91/9; LVEDP 17.  There was no gradient between the left ventricle and aorta.    ANGIOGRAPHIC DATA:   The left main coronary artery has mild disease.  The left anterior descending artery is a large vessel proximally. In the mid vessel, there is mild, diffuse disease. There are several medium-size diagonals which are widely patent.  The left circumflex artery is large vessel. There is mild disease in the proximal portion. The OM1 is a large vessel. There is mild to moderate disease in the mid vessel, up to 40%.  The right coronary artery is a large, dominant vessel. In the proximal portion, there is a moderate stenosis of up to 50%. The posterior descending artery is a large vessel and reaches the apex and is widely patent. The posterior lateral artery is occluded proximally, just past the ostium.  After opening the vessel of the balloon, it was noted that the disease extended into the mid posterior lateral artery. The posterior lateral artery was small  caliber.  LEFT VENTRICULOGRAM:  Left ventricular angiogram was done in the 30 RAO projection and revealed mild, basal inferior hypokinesis with normal systolic function with an estimated ejection fraction of 55%.  LVEDP was 17 mmHg.  PCI NARRATIVE: A JR4 guiding catheter used to engage the RCA. Angiomax was used for anticoagulation. ACT was used to check that the Angiomax was therapeutic. A pro-water wire was placed across the area disease in the posterior lateral artery. An aspiration catheter was advanced to the ostium of the posterior lateral artery. Aspiration thrombectomy was performed with some improvement in flow. A 2.0 x 12 balloon was then used to predilate patient. The vessel was fairly small in caliber. Since the patient admits to prior bleeding issues as well as frequently forgetting to take his medications, we opted for a bare-metal stent. A 2.0 x 23 vision bare metal stent was deployed. The proximalstent was postdilated  with a 2.25 by 12 noncompliant balloon. There was an excellent angiographic result of the proximal stent and the bifurcation with the posterior descending artery. There was a stepdown at the distal edge of the stent. TIMI-3 flow was restored. The patient's pain was significantly improved.  IMPRESSIONS:  1. Widely patent left main coronary artery. 2. Mild disease in the left anterior descending artery and its branches. 3. Mild disease in the left circumflex artery and its branches. 4. Moderate disease in the proximal right coronary artery.occluded posterior lateral artery which was the culprit for the patient's presentation. This was successfully treated with a 2.0 x 23 vision bare metal stent, postdilated to 2.25 mm  in diameter. 5. Normal left ventricular systolic function.  LVEDP 17 mmHg.  Ejection fraction 55%.  RECOMMENDATION:  Continue dual antiplatelet therapy for at least 30 days and likely longer. A bare-metal stent was chosen because the patient admits to  forgetting to take his medication fairly frequently. He also stated that his wife does not remember to take medicines as well. Continue aggressive secondary prevention. He'll be watched in the ICU. Continue Angiomax at the reduced rate until the bag runs out.  He needs to stop smoking.  Significant family h/o CAD.  Start beta blocker.   Start statin. Nicotine patch.  [place] for [place] to be compliant.

## 2013-12-16 NOTE — ED Notes (Signed)
Per EMS: pt coming from home with chest pain starting a hour ago. Pt reports mid sternal pain 10/10. Pt was watching TV when onset of pain occurred. Pt has family hx of MI's. Pt reported shortness of breath, diaphoretic, pale, weak, dizzy. A&Ox4, repirations equal and unlabored.

## 2013-12-17 ENCOUNTER — Encounter (HOSPITAL_COMMUNITY): Payer: Self-pay

## 2013-12-17 DIAGNOSIS — Z9861 Coronary angioplasty status: Secondary | ICD-10-CM

## 2013-12-17 DIAGNOSIS — F209 Schizophrenia, unspecified: Secondary | ICD-10-CM

## 2013-12-17 DIAGNOSIS — I2119 ST elevation (STEMI) myocardial infarction involving other coronary artery of inferior wall: Principal | ICD-10-CM

## 2013-12-17 LAB — BASIC METABOLIC PANEL
Anion gap: 13 (ref 5–15)
BUN: 7 mg/dL (ref 6–23)
CO2: 21 meq/L (ref 19–32)
Calcium: 8.6 mg/dL (ref 8.4–10.5)
Chloride: 108 mEq/L (ref 96–112)
Creatinine, Ser: 0.97 mg/dL (ref 0.50–1.35)
GFR calc Af Amer: >90 mL/min (ref >90)
GFR calc non Af Amer: >90 mL/min (ref >90)
GLUCOSE: 93 mg/dL (ref 70–99)
POTASSIUM: 4.2 meq/L (ref 3.7–5.3)
SODIUM: 142 meq/L (ref 137–147)

## 2013-12-17 MED ORDER — ATENOLOL 25 MG PO TABS
25.0000 mg | ORAL_TABLET | Freq: Every day | ORAL | Status: DC
  Administered 2013-12-17: 25 mg via ORAL
  Filled 2013-12-17 (×2): qty 1

## 2013-12-17 MED ORDER — PRASUGREL HCL 10 MG PO TABS
10.0000 mg | ORAL_TABLET | Freq: Every day | ORAL | Status: DC
  Administered 2013-12-17: 10 mg via ORAL
  Filled 2013-12-17 (×2): qty 1

## 2013-12-17 MED FILL — Sodium Chloride IV Soln 0.9%: INTRAVENOUS | Qty: 50 | Status: AC

## 2013-12-17 NOTE — Plan of Care (Signed)
Problem: Phase III Progression Outcomes Goal: Anginal pain absent Outcome: Completed/Met Date Met:  12/17/13 Goal: Up to chair & ambulate with assist (TID) Outcome: Completed/Met Date Met:  12/17/13 Goal: VS Stable with increased activity Outcome: Completed/Met Date Met:  12/17/13 Goal: Vascular site scale level 0 - I Vascular Site Scale Level 0: No bruising/bleeding/hematoma Level I (Mild): Bruising/Ecchymosis, minimal bleeding/ooozing, palpable hematoma < 3 cm Level II (Moderate): Bleeding not affecting hemodynamic parameters, pseudoaneurysm, palpable hematoma > 3 cm Level III (Severe) Bleeding which affects hemodynamic parameters or retroperitoneal hemorrhage  Outcome: Completed/Met Date Met:  12/17/13 Goal: Tolerating diet Outcome: Completed/Met Date Met:  12/17/13 Goal: Discharge plan remains appropriate-arrangements made Outcome: Completed/Met Date Met:  12/17/13

## 2013-12-17 NOTE — Plan of Care (Signed)
Problem: Phase III Progression Outcomes Goal: ACE I or ARB if EF < 40% Outcome: Not Applicable Date Met:  12/06/11  Problem: Discharge Progression Outcomes Goal: Discharge plan in place and appropriate Outcome: Completed/Met Date Met:  12/17/13 Goal: Anginal pain absent Outcome: Completed/Met Date Met:  12/17/13

## 2013-12-17 NOTE — Progress Notes (Signed)
CARDIAC REHAB PHASE I   PRE:  Rate/Rhythm: 78 SR    BP: sitting 133/81    SaO2:   MODE:  Ambulation: 420 ft   POST:  Rate/Rhythm: 89 SR    BP: sitting 138/83     SaO2:   Pt denied problems except for leg pain, which is normal for him. Sts he cannot walk far due to nerve damage. Limping by end of walk. Pt sts he mostly plays video games and watches tv. Ed completed reinforcing need for meds and smoking cessation. Pt is thinking of quitting smoking. Had 2 month success previously with patches. Long discussion of this and resources available. Pt is unable to ex due to nerve pain. [id number]  Elissa LovettReeve, Kai Calico WestfordKristan CES, ACSM 12/17/2013 2:24 PM

## 2013-12-17 NOTE — Progress Notes (Addendum)
43 year old young man with family history of coronary disease as well as hypertension and hyperlipidemia and long-term smoker presented with an inferolateral MI found to have an occluded proximal RPL branch treated with a bare metal stent.  Subjective:  Slept well with no further CP or dyspnea - leg & wrist hurt "a bit" He is getting anxious about going home - concerned about financial issues & ability to pay for gas in order for his wife to drive back & forth. Wants to go home   Objective:  Vital Signs in the last 24 hours: Temp:  [97.4 F (36.3 C)-98.1 F (36.7 C)] 97.8 F (36.6 C) (11/11 1130) Pulse Rate:  [71-90] 71 (11/11 1130) Resp:  [9-21] 20 (11/11 1130) BP: (88-132)/(44-86) 132/82 mmHg (11/11 1130) SpO2:  [94 %-100 %] 98 % (11/11 1130)  Intake/Output from previous day: 11/10 0701 - 11/11 0700 In: 1566 [P.O.:1020; I.V.:546] Out: 1651 [Urine:1650; Stool:1] Intake/Output from this shift:    Physical Exam: General appearance: alert, cooperative, appears stated age and no distress - anxious & stressed Neck: no adenopathy, no carotid bruit, no JVD and supple, symmetrical, trachea midline Lungs: CTAB and normal percussion bilaterally Heart: RRR (borderline brady), S1, S2 normal, no murmur, click, rub or gallop Abdomen: soft, non-tender; bowel sounds normal; no masses,  no organomegaly Extremities: extremities normal, atraumatic, no cyanosis or edema and groin cath site C/D/I Pulses: 2+ and symmetric Neurologic: Grossly normal  Lab Results:  Recent Labs  12/16/13 0330 12/16/13 0354 12/16/13 1040  WBC 9.8  --  9.5  HGB 14.8 13.9 13.1  PLT 248  --  237    Recent Labs  12/16/13 0330 12/16/13 0354 12/17/13 0652  NA 139 140 142  K 4.1 3.1* 4.2  CL 101 105 108  CO2 24  --  21  GLUCOSE 175* 160* 93  BUN 10 8 7   CREATININE 1.11 0.90 0.97    Recent Labs  12/16/13 1610 12/16/13 2135  TROPONINI >20.00* >20.00*   Hepatic Function Panel No results for  input(s): PROT, ALBUMIN, AST, ALT, ALKPHOS, BILITOT, BILIDIR, IBILI in the last 72 hours. No results for input(s): CHOL in the last 72 hours. No results for input(s): PROTIME in the last 72 hours.  Imaging:  Chest x-ray shows suboptimal inspiration with mild bibasilar atelectasis but no acute cardio pulmonary process.  Cardiac Studies:  Cardiac Cath: occluded RPL - treated with vision BMS 2.0 mm x 23 mm (2.25 mm); EF roughly 55%, LVEDP 17 mmHg.  Telemetry with occasional PVCs  Assessment/Plan:  Principal Problem:   Acute MI, inferior wall, initial episode of care Active Problems:   CAD S/P PCI:  2.0 x 23 vision bare metal stent (2.25 mm) RPL   Smoker   Essential hypertension   Hyperlipidemia with target LDL less than 70   Schizophrenic disorder   Bipolar 1 disorder   Family history of premature CAD    Status post bare metal stent PCI. Currently on aspirin  - convert to Effient--> With BMS, Effient is not mandatory for the first year , however with ACS presentation, and antiplatelet agent would be recommended for at least one year. (could change to Plavix if needed for $$ issues.  Will convert to Effient (he mentioned some "gasping" Sx & b/c more effective med assistance.  CM consulted for Med assistance .  Smoking cessation counseling provided - on NicoDerm patch  Statin added  Blood pressure stable on low-dose beta blocker. But no BP room for ARB/ACE-I  Has overnight Bradycardia with rates as low as 38 bpm = convert to Atenolol in AM for long acting coverage that wanes over night  Would need to monitor pressures for 24 hours to determine if we can add an ARB or ACE inhibitor. He is only on low-dose beta blocker for now. We need to get his home medication regimen to understand his antipsychotic and other psychotropic medications  PRN Ativan Will need SW consult to assist with possibly needing a ride home. I was able to convince him to stay today with plan for AM d/c  home.  Expected d/c order written.  Transfer to Tele today.    LOS: 1 day    HARDING,DAVID W 12/17/2013, 12:19 PM

## 2013-12-17 NOTE — Plan of Care (Signed)
Problem: Phase I Progression Outcomes Goal: Initial discharge plan identified Outcome: Completed/Met Date Met:  12/17/13 Goal: Other Phase I Outcomes/Goals Outcome: Not Applicable Date Met:  43/14/27  Problem: Phase II Progression Outcomes Goal: Anginal pain absent Outcome: Completed/Met Date Met:  12/17/13 Goal: Hemodynamically stable Outcome: Completed/Met Date Met:  12/17/13 Goal: Vascular site scale level 0 - I Vascular Site Scale Level 0: No bruising/bleeding/hematoma Level I (Mild): Bruising/Ecchymosis, minimal bleeding/ooozing, palpable hematoma < 3 cm Level II (Moderate): Bleeding not affecting hemodynamic parameters, pseudoaneurysm, palpable hematoma > 3 cm Level III (Severe) Bleeding which affects hemodynamic parameters or retroperitoneal hemorrhage  Outcome: Completed/Met Date Met:  12/17/13 Goal: Cardiac Rehab referral Outcome: Completed/Met Date Met:  12/17/13 Goal: Wean IV nitroglycerin to PO or topical Outcome: Completed/Met Date Met:  12/17/13 Goal: Discharge plan established Outcome: Completed/Met Date Met:  12/17/13 Goal: Tolerating diet Outcome: Completed/Met Date Met:  12/17/13 Goal: Other Phase II Outcomes/Goals Outcome: Not Applicable Date Met:  67/01/10

## 2013-12-18 ENCOUNTER — Telehealth: Payer: Self-pay | Admitting: Cardiology

## 2013-12-18 ENCOUNTER — Encounter (HOSPITAL_COMMUNITY): Payer: Self-pay | Admitting: Physician Assistant

## 2013-12-18 DIAGNOSIS — F319 Bipolar disorder, unspecified: Secondary | ICD-10-CM

## 2013-12-18 DIAGNOSIS — Z720 Tobacco use: Secondary | ICD-10-CM | POA: Diagnosis present

## 2013-12-18 DIAGNOSIS — E785 Hyperlipidemia, unspecified: Secondary | ICD-10-CM | POA: Diagnosis present

## 2013-12-18 DIAGNOSIS — I2111 ST elevation (STEMI) myocardial infarction involving right coronary artery: Secondary | ICD-10-CM

## 2013-12-18 DIAGNOSIS — I2510 Atherosclerotic heart disease of native coronary artery without angina pectoris: Secondary | ICD-10-CM | POA: Diagnosis present

## 2013-12-18 DIAGNOSIS — I10 Essential (primary) hypertension: Secondary | ICD-10-CM | POA: Diagnosis present

## 2013-12-18 LAB — BASIC METABOLIC PANEL
Anion gap: 12 (ref 5–15)
BUN: 7 mg/dL (ref 6–23)
CHLORIDE: 105 meq/L (ref 96–112)
CO2: 22 meq/L (ref 19–32)
Calcium: 8.9 mg/dL (ref 8.4–10.5)
Creatinine, Ser: 0.98 mg/dL (ref 0.50–1.35)
GFR calc Af Amer: >90 mL/min (ref >90)
GFR calc non Af Amer: >90 mL/min (ref >90)
GLUCOSE: 88 mg/dL (ref 70–99)
Potassium: 3.9 mEq/L (ref 3.7–5.3)
SODIUM: 139 meq/L (ref 137–147)

## 2013-12-18 LAB — TROPONIN I: TROPONIN I: 9.88 ng/mL — AB (ref <0.30)

## 2013-12-18 MED ORDER — ATORVASTATIN CALCIUM 80 MG PO TABS: 80.0000 mg | ORAL_TABLET | Freq: Every day | ORAL | Status: DC

## 2013-12-18 MED ORDER — ATENOLOL 25 MG PO TABS: 25.0000 mg | ORAL_TABLET | Freq: Every day | ORAL | Status: DC

## 2013-12-18 MED ORDER — NICOTINE 21 MG/24HR TD PT24: 21.0000 mg | MEDICATED_PATCH | Freq: Every day | TRANSDERMAL | Status: DC

## 2013-12-18 MED ORDER — ASPIRIN 81 MG PO CHEW: 81.0000 mg | CHEWABLE_TABLET | Freq: Every day | ORAL | Status: DC

## 2013-12-18 MED ORDER — PRASUGREL HCL 10 MG PO TABS: 10.0000 mg | ORAL_TABLET | Freq: Every day | ORAL | Status: DC

## 2013-12-18 NOTE — Progress Notes (Signed)
CRITICAL VALUE ALERT  Critical value received:  Troponin level-9.88  Date of notification:  12/18/13  Time of notification:  0924  Critical value read back:Yes.    Nurse who received alert:  Theodoro KalataLibby Melodi Happel  MD notified (1st page):  Carlean JewsKatie Thompson, [place]. Spoke with her in person on 2W. She is aware of results. Pt has been discharged.  Time of first page:  [phone]

## 2013-12-18 NOTE — Progress Notes (Signed)
Patient is being discharged home. Pt has been provided with discharge instructions. RN went over instructions with the patient and answered all questions that the patient had.

## 2013-12-18 NOTE — Telephone Encounter (Signed)
[facility] 12/18/13

## 2013-12-18 NOTE — Discharge Summary (Signed)
Discharge Summary   Patient ID: Don Fletcher MRN: [id number], DOB/AGE: September 01, 1970 43 y.o. Admit date: 12/16/2013 D/C date:     12/18/2013  Primary Cardiologist: Dr. Wyline MoodBranch ([place])  Principal Problem:   ST elevation myocardial infarction involving right coronary artery Active Problems:   Family history of premature CAD   Schizophrenic disorder   Bipolar 1 disorder   CAD (coronary artery disease)   Tobacco abuse   HLD (hyperlipidemia)   Hypertension    Admission Dates: 12/16/13-12/18/13 Discharge Diagnosis: inferolateral STEMI s/p BMS to proximal RPL  HPI: Don Fletcher is a 43 y.o. male with a history of tobacco abuse, bipolar disorder, schizophrenic disorder, HTN, HLD, PUD and no past cardiac history who [facility] with an inferolateral MI and was taken back for emergent cardiac catheterization.   He has a strong family history of CAD, but no prior cardiac history. The patient reported developing severe SSCP with associated nausea and shortness of breath while playing video games on the morning of admission. It was 10/10 in severity. Had never experienced these symptoms before. EMS was called and he was found to have 1-452mm STE in II, III, and aVF with subtle elevation in V4-V6 and reciprocal depressions in aVL and V2. He was given 250cc NS, aspirin, and SL NTG en route. The cath lab was activated prior to arrival to the ED.   Hospital Course  CAD/inferolateral STEMI- s/p BMS to proximal RPL. V-gram demonstrated normal EF with HK of the basal inferior wall. -- Troponin >20 -- He was initially placed on DAPT with ASA and Brilinta, but this was converted to Effient due to some SOB and possibly better coverage with insurance. -- With BMS, Effient is not mandatory for the first year; however, with ACS presentation an antiplatelet agent would be recommended for at least one year. (could change to Plavix if needed for $$ issues.) --  Continue ASA, Effient, atorvastatin 80mg  and atenolol 25mg  -- Blood pressure stable on low-dose beta blocker. But no BP room for ARB/ACE-I. This can be addressed at follow up appointment next week  Tobacco abuse- Smoking cessation counseling provided - provided NicoDerm patch  Bradycardia- had overnight bradycardia with rates as low as 38 bpm so he was converted to Atenolol in AM for long acting coverage that wanes over night  Psychiatric disorders- continue Valium -- Follow up with PCP and psychiatric specialists   The patient has had an uncomplicated hospital course and is recovering well. The radial catheter site is stable. He has been seen by Dr. Herbie BaltimoreHarding today and deemed ready for discharge home. All follow-up appointments have been scheduled. Smoking cessation was disscussed in length. Discharge medications are listed below.   Discharge Vitals: Blood pressure 122/78, pulse 74, temperature 98 F (36.7 C), temperature source Oral, resp. rate 18, height 5\' 8"  (1.727 m), weight 198 lb 4.8 oz (89.948 kg), SpO2 98 %.  Labs: Lab Results  Component Value Date   WBC 9.5 12/16/2013   HGB 13.1 12/16/2013   HCT 39.1 12/16/2013   MCV 91.1 12/16/2013   PLT 237 12/16/2013     Recent Labs Lab 12/18/13 0242  NA 139  K 3.9  CL 105  CO2 22  BUN 7  CREATININE 0.98  CALCIUM 8.9  GLUCOSE 88    Recent Labs  12/16/13 0330 12/16/13 1040 12/16/13 1610 12/16/13 2135  TROPONINI <0.30 >20.00* >20.00* >20.00*    Physical Exam: General appearance: alert,  cooperative, appears stated age and no distress - anxious & stressed Neck: no adenopathy, no carotid bruit, no JVD and supple, symmetrical, trachea midline Lungs: CTAB and normal percussion bilaterally Heart: RRR (borderline brady), S1, S2 normal, no murmur, click, rub or gallop Abdomen: soft, non-tender; bowel sounds normal; no masses, no organomegaly Extremities: extremities normal, atraumatic, no cyanosis or edema and groin cath site  C/D/I Pulses: 2+ and symmetric Neurologic: Grossly normal   Diagnostic Studies/Procedures     PROCEDURE: Left heart catheterization with selective coronary angiography, left ventriculogram. Aspiration thrombectomy PLA; PCI PLA. INDICATIONS: Inferior STEMI The risks, benefits, and details of the procedure were explained to the patient. The patient verbalized understanding and wanted to proceed. Emergency consent was obtained. PROCEDURE TECHNIQUE: After Xylocaine anesthesia, right radial access was attempted. The wire could not be passed with two attempts. After Xylocaine anesthesia, a 45F sheath was placed in the right femoral artery with a single anterior needle wall stick. Left coronary angiography was done using a Judkins L4 guide catheter. Right coronary angiography was done using a Judkins R4 guide catheter. Left ventriculography was done using a pigtail catheter. Angioseal was deployed for hemostasis.  CONTRAST: Total of 145 cc. COMPLICATIONS: None.  HEMODYNAMICS: Aortic pressure was 91/60; LV pressure was 91/9; LVEDP 17. There was no gradient between the left ventricle and aorta.  ANGIOGRAPHIC DATA: The left main coronary artery has mild disease. The left anterior descending artery is a large vessel proximally. In the mid vessel, there is mild, diffuse disease. There are several medium-size diagonals which are widely patent. The left circumflex artery is large vessel. There is mild disease in the proximal portion. The OM1 is a large vessel. There is mild to moderate disease in the mid vessel, up to 40%. The right coronary artery is a large, dominant vessel. In the proximal portion, there is a moderate stenosis of up to 50%. The posterior descending artery is a large vessel and reaches the apex and is widely patent. The posterior lateral artery is occluded proximally, just past the ostium. After opening the vessel of the balloon, it was noted that the disease extended into  the mid posterior lateral artery. The posterior lateral artery was small caliber. LEFT VENTRICULOGRAM: Left ventricular angiogram was done in the 30 RAO projection and revealed mild, basal inferior hypokinesis with normal systolic function with an estimated ejection fraction of 55%. LVEDP was 17 mmHg. PCI NARRATIVE: A JR4 guiding catheter used to engage the RCA. Angiomax was used for anticoagulation. ACT was used to check that the Angiomax was therapeutic. A pro-water wire was placed across the area disease in the posterior lateral artery. An aspiration catheter was advanced to the ostium of the posterior lateral artery. Aspiration thrombectomy was performed with some improvement in flow. A 2.0 x 12 balloon was then used to predilate patient. The vessel was fairly small in caliber. Since the patient admits to prior bleeding issues as well as frequently forgetting to take his medications, we opted for a bare-metal stent. A 2.0 x 23 vision bare metal stent was deployed. The proximalstent was postdilated with a 2.25 by 12 noncompliant balloon. There was an excellent angiographic result of the proximal stent and the bifurcation with the posterior descending artery. There was a stepdown at the distal edge of the stent. TIMI-3 flow was restored. The patient's pain was significantly improved. IMPRESSIONS: 1. Widely patent left main coronary artery. 2. Mild disease in the left anterior descending artery and its branches. 3. Mild disease in the  left circumflex artery and its branches. 4. Moderate disease in the proximal right coronary artery.occluded posterior lateral artery which was the culprit for the patient's presentation. This was successfully treated with a 2.0 x 23 vision bare metal stent, postdilated to 2.25 mm in diameter. 5. Normal left ventricular systolic function. LVEDP 17 mmHg. Ejection fraction 55%. RECOMMENDATION: Continue dual antiplatelet therapy for at least 30 days and likely longer. A  bare-metal stent was chosen because the patient admits to forgetting to take his medication fairly frequently. He also stated that his wife does not remember to take medicines as well. Continue aggressive secondary prevention. He'll be watched in the ICU. Continue Angiomax at the reduced rate until the bag runs out. He needs to stop smoking. Significant family h/o CAD. Start beta blocker. Start statin. Nicotine patch. [place] if [place] is easier for him to be compliant.   Discharge Medications     Medication List    STOP taking these medications        ibuprofen 800 MG tablet  Commonly known as:  ADVIL,MOTRIN     ranitidine 150 MG capsule  Commonly known as:  ZANTAC     sucralfate 1 G tablet  Commonly known as:  CARAFATE      TAKE these medications        aspirin 81 MG chewable tablet  Chew 1 tablet (81 mg total) by mouth daily.     atenolol 25 MG tablet  Commonly known as:  TENORMIN  Take 1 tablet (25 mg total) by mouth daily.     atorvastatin 80 MG tablet  Commonly known as:  LIPITOR  Take 1 tablet (80 mg total) by mouth daily at 6 PM.     diazepam 10 MG tablet  Commonly known as:  VALIUM  Take 10 mg by mouth 2 (two) times daily as needed for anxiety.     nicotine 21 mg/24hr patch  Commonly known as:  NICODERM CQ - dosed in mg/24 hours  Place 1 patch (21 mg total) onto the skin daily.     omeprazole 20 MG capsule  Commonly known as:  PRILOSEC  Take 1 capsule (20 mg total) by mouth daily.     prasugrel 10 MG Tabs tablet  Commonly known as:  EFFIENT  Take 1 tablet (10 mg total) by mouth daily.        Disposition   The patient will be discharged in stable condition to home.  Follow-up Information    Follow up with Antoine PocheBRANCH, JONATHAN, F, MD On 12/25/2013.   Specialty:  Cardiology   Why:  8:40am    Contact information:   [address] [place] [address] [phone]         Duration of Discharge Encounter:  Greater than 30 minutes including physician and PA time.  Byrd HesselbachSigned, Koni Kannan R PA-C 12/18/2013, 8:37 AM

## 2013-12-18 NOTE — Discharge Instructions (Signed)

## 2013-12-19 NOTE — Telephone Encounter (Signed)
Patient contacted regarding [facility] on 12/18/13.  Patient understands to follow up with provider Dr. Wyline MoodBranch on Thursday 12/25/13 at 8:40 am at [facility]. Patient does understand his discharge instructions. Patient does understand his medications and regimen. Patient does understand to bring all of his medications to this visit.  Patient asked if he could drive a vehicle. Nurse advised patient that he should discuss that with the doctor when he comes for his visit.

## 2013-12-25 ENCOUNTER — Ambulatory Visit (INDEPENDENT_AMBULATORY_CARE_PROVIDER_SITE_OTHER): Payer: Medicaid Other | Admitting: Cardiology

## 2013-12-25 ENCOUNTER — Encounter: Payer: Self-pay | Admitting: Cardiology

## 2013-12-25 VITALS — BP 109/74 | HR 76 | Ht 68.0 in | Wt 193.0 lb

## 2013-12-25 DIAGNOSIS — E785 Hyperlipidemia, unspecified: Secondary | ICD-10-CM

## 2013-12-25 DIAGNOSIS — Z9861 Coronary angioplasty status: Secondary | ICD-10-CM

## 2013-12-25 DIAGNOSIS — R0602 Shortness of breath: Secondary | ICD-10-CM

## 2013-12-25 DIAGNOSIS — I10 Essential (primary) hypertension: Secondary | ICD-10-CM

## 2013-12-25 DIAGNOSIS — I2510 Atherosclerotic heart disease of native coronary artery without angina pectoris: Secondary | ICD-10-CM

## 2013-12-25 MED ORDER — LISINOPRIL 5 MG PO TABS: 5.0000 mg | ORAL_TABLET | Freq: Every day | ORAL | Status: DC

## 2013-12-25 NOTE — Progress Notes (Signed)
Clinical Summary [name] is a 43 y.o.male seen today as a hospital follow up appointment, this is our first visit together.  1. CAD - pt admitted 12/16/13 with inferolateral STEMI, received BMS to proximal RPL. Full cath report below. LVEF 55% by LVgram, does not have an echo in our records.  - since discharge notes some generalized fatigue. Has had some SOB. No chest pain.  - on DAPT with ASA and effient, initially on brillinta but had some SOB and was changed while in the hospital  - had some noted bradycardia on beta blocker, kept on low dose  2. HTN - compliant with meds - does not check bp at home  3. Hyperlipidemia - on high dose statin, compliant  4. Tobacco abuse - down to 1/2 ppd, approx 25 years. Has nicotine patches at home but has not started yet  Past Medical History  Diagnosis Date  . Bipolar 1 disorder   . Schizophrenic disorder   . Hypertension   . Chronic back pain   . Peptic ulcer disease   . CAD (coronary artery disease)     a. 12/16/13 inferolat STEMI s/p BMS to RPL  . HLD (hyperlipidemia)   . Tobacco abuse      Allergies  Allergen Reactions  . Seroquel [Quetiapine] Other (See Comments)    "makes my legs cramp"  . Tylenol Pm Extra [Diphenhydramine-Apap (Sleep)] Other (See Comments)    "makes my leg cramp"     Current Outpatient Prescriptions  Medication Sig Dispense Refill  . aspirin 81 MG chewable tablet Chew 1 tablet (81 mg total) by mouth daily.    [name]. atenolol (TENORMIN) 25 MG tablet Take 1 tablet (25 mg total) by mouth daily. 30 tablet 11  . atorvastatin (LIPITOR) 80 MG tablet Take 1 tablet (80 mg total) by mouth daily at 6 PM. 30 tablet 11  . diazepam (VALIUM) 10 MG tablet Take 10 mg by mouth 2 (two) times daily as needed for anxiety.    . nicotine (NICODERM CQ - DOSED IN MG/24 HOURS) 21 mg/24hr patch Place 1 patch (21 mg total) onto the skin daily. 28 patch 12  . omeprazole (PRILOSEC) 20 MG capsule Take 1 capsule (20 mg total) by  mouth daily. (Patient taking differently: Take 40 mg by mouth daily. ) 60 capsule 0  . prasugrel (EFFIENT) 10 MG TABS tablet Take 1 tablet (10 mg total) by mouth daily. 30 tablet 11   No current facility-administered medications for this visit.     Past Surgical History  Procedure Laterality Date  . Hernia repair       Allergies  Allergen Reactions  . Seroquel [Quetiapine] Other (See Comments)    "makes my legs cramp"  . Tylenol Pm Extra [Diphenhydramine-Apap (Sleep)] Other (See Comments)    "makes my leg cramp"      No family history on file.   Social History [name] Cigarettes.  He has been smoking about 1.00 pack per day. He does not have any smokeless tobacco history on file. [name].   Review of Systems CONSTITUTIONAL: No weight loss, fever, chills, weakness or fatigue.  HEENT: Eyes: No visual loss, blurred vision, double vision or yellow sclerae.No hearing loss, sneezing, congestion, runny nose or sore throat.  SKIN: No rash or itching.  CARDIOVASCULAR: per HPI RESPIRATORY: No cough or sputum.  GASTROINTESTINAL: No anorexia, nausea, vomiting or diarrhea. No abdominal pain  or blood.  GENITOURINARY: No burning on urination, no polyuria NEUROLOGICAL: No headache, dizziness, syncope, paralysis, ataxia, numbness or tingling in the extremities. No change in bowel or bladder control.  MUSCULOSKELETAL: No muscle, back pain, joint pain or stiffness.  LYMPHATICS: No enlarged nodes. No history of splenectomy.  PSYCHIATRIC: No history of depression or anxiety.  ENDOCRINOLOGIC: No reports of sweating, cold or heat intolerance. No polyuria or polydipsia.  [name]   Physical Examination p 76 bp 109/74 Wt 193 lbs BMI 29 Gen: resting comfortably, no acute distress HEENT: no scleral icterus, pupils equal round and reactive, no palptable cervical adenopathy,  CV: RRR, no m/r/g, no JVD, no carotid bruits Resp:  Clear to auscultation bilaterally GI: abdomen is soft, non-tender, non-distended, normal bowel sounds, no hepatosplenomegaly MSK: extremities are warm, no edema.  Skin: warm, no rash Neuro:  no focal deficits Psych: appropriate affect   Diagnostic Studies 12/16/13 Cath HEMODYNAMICS: Aortic pressure was 91/60; LV pressure was 91/9; LVEDP 17. There was no gradient between the left ventricle and aorta.  ANGIOGRAPHIC DATA: The left main coronary artery has mild disease. The left anterior descending artery is a large vessel proximally. In the mid vessel, there is mild, diffuse disease. There are several medium-size diagonals which are widely patent. The left circumflex artery is large vessel. There is mild disease in the proximal portion. The OM1 is a large vessel. There is mild to moderate disease in the mid vessel, up to 40%. The right coronary artery is a large, dominant vessel. In the proximal portion, there is a moderate stenosis of up to 50%. The posterior descending artery is a large vessel and reaches the apex and is widely patent. The posterior lateral artery is occluded proximally, just past the ostium. After opening the vessel of the balloon, it was noted that the disease extended into the mid posterior lateral artery. The posterior lateral artery was small caliber. LEFT VENTRICULOGRAM: Left ventricular angiogram was done in the 30 RAO projection and revealed mild, basal inferior hypokinesis with normal systolic function with an estimated ejection fraction of 55%. LVEDP was 17 mmHg. PCI NARRATIVE: A JR4 guiding catheter used to engage the RCA. Angiomax was used for anticoagulation. ACT was used to check that the Angiomax was therapeutic. A pro-water wire was placed across the area disease in the posterior lateral artery. An aspiration catheter was advanced to the ostium of the posterior lateral artery. Aspiration thrombectomy was performed with some improvement in flow. A 2.0 x 12  balloon was then used to predilate patient. The vessel was fairly small in caliber. Since the patient admits to prior bleeding issues as well as frequently forgetting to take his medications, we opted for a bare-metal stent. A 2.0 x 23 vision bare metal stent was deployed. The proximalstent was postdilated with a 2.25 by 12 noncompliant balloon. There was an excellent angiographic result of the proximal stent and the bifurcation with the posterior descending artery. There was a stepdown at the distal edge of the stent. TIMI-3 flow was restored. The patient's pain was significantly improved. IMPRESSIONS: 1. Widely patent left main coronary artery. 2. Mild disease in the left anterior descending artery and its branches. 3. Mild disease in the left circumflex artery and its branches. 4. Moderate disease in the proximal right coronary artery.occluded posterior lateral artery which was the culprit for the patient's presentation. This was successfully treated with a 2.0 x 23 vision bare metal stent, postdilated to 2.25 mm in diameter. 5. Normal left ventricular systolic function.  LVEDP 17 mmHg. Ejection fraction 55%. RECOMMENDATION: Continue dual antiplatelet therapy for at least 30 days and likely longer. A bare-metal stent was chosen because the patient admits to forgetting to take his medication fairly frequently. He also stated that his wife does not remember to take medicines as well. Continue aggressive secondary prevention. He'll be watched in the ICU. Continue Angiomax at the reduced rate until the bag runs out. He needs to stop smoking. Significant family h/o CAD. Start beta blocker. Start statin. Nicotine patch. [place] if that is easier for him to be compliant.     Assessment and Plan  1. CAD - recent STEMI, received BMS to proximal RPL. Denies any recent symptoms - continue DAPT until 12/2014 - describes some SOB ever since MI, was changed from brillinta to  effient without any improvement. Given his long history of tobacco he may be having some bronchospasm from atenolol which was also newly started, will try holding and order PFTs - start lisionpril 5mg  daily in patient with known CAD, check BMET in 2 weeks - obtain echo s/p MI to establish function  2. HTN - at goal, med changes as described above  3. Hyperliidemia - continue high dose statin in setting of CAD and recent MI  4. Tobacco abuse - counseled on cesstation, advised to use nicotine patches he has at home - check PFTs due to long tobacco history and +SOB    F/u 6 weeks  Antoine PocheJonathan F. Branch, M.D.

## 2013-12-25 NOTE — Patient Instructions (Signed)
   Stop Atenolol  Begin Lisinopril 5mg  daily - new sent to pharm Continue all other medications.   Lab for BMET - due in 2 weeks, around 01/08/2014.   Your physician has requested that you have an echocardiogram. Echocardiography is a painless test that uses sound waves to create images of your heart. It provides your doctor with information about the size and shape of your heart and how well your heart's chambers and valves are working. This procedure takes approximately one hour. There are no restrictions for this procedure. Your physician has recommended that you have a pulmonary function test. Pulmonary Function Tests are a group of tests that measure how well air moves in and out of your lungs Office will contact with results via phone or letter.   Follow up in  6 weeks

## 2013-12-30 ENCOUNTER — Other Ambulatory Visit (INDEPENDENT_AMBULATORY_CARE_PROVIDER_SITE_OTHER): Payer: Medicaid Other

## 2013-12-30 ENCOUNTER — Other Ambulatory Visit: Payer: Self-pay

## 2013-12-30 DIAGNOSIS — Z9861 Coronary angioplasty status: Secondary | ICD-10-CM

## 2013-12-30 DIAGNOSIS — R0602 Shortness of breath: Secondary | ICD-10-CM

## 2013-12-30 DIAGNOSIS — I2510 Atherosclerotic heart disease of native coronary artery without angina pectoris: Secondary | ICD-10-CM

## 2014-01-05 ENCOUNTER — Ambulatory Visit (HOSPITAL_COMMUNITY)
Admission: RE | Admit: 2014-01-05 | Discharge: 2014-01-05 | Disposition: A | Discharge status: Home / Self Care | Payer: Medicaid Other | Source: Ambulatory Visit | Attending: Cardiology | Admitting: Cardiology

## 2014-01-05 DIAGNOSIS — R0602 Shortness of breath: Secondary | ICD-10-CM | POA: Diagnosis present

## 2014-01-05 DIAGNOSIS — F17210 Nicotine dependence, cigarettes, uncomplicated: Secondary | ICD-10-CM | POA: Diagnosis not present

## 2014-01-05 LAB — PULMONARY FUNCTION TEST
DL/VA % pred: 98 %
DL/VA: 4.48 ml/min/mmHg/L
DLCO cor % pred: 52 %
DLCO cor: 15.52 ml/min/mmHg
DLCO unc % pred: 52 %
DLCO unc: 15.52 ml/min/mmHg
FEF 25-75 PRE: 2.3 L/s
FEF 25-75 Post: 1.89 L/sec
FEF2575-%CHANGE-POST: -17 %
FEF2575-%Pred-Post: 51 %
FEF2575-%Pred-Pre: 62 %
FEV1-%CHANGE-POST: -4 %
FEV1-%PRED-PRE: 68 %
FEV1-%Pred-Post: 66 %
FEV1-POST: 2.57 L
FEV1-Pre: 2.68 L
FEV1FVC-%CHANGE-POST: 2 %
FEV1FVC-%Pred-Pre: 93 %
FEV6-%Change-Post: -10 %
FEV6-%PRED-PRE: 76 %
FEV6-%Pred-Post: 68 %
FEV6-PRE: 3.63 L
FEV6-Post: 3.25 L
FEV6FVC-%PRED-PRE: 103 %
FEV6FVC-%Pred-Post: 103 %
FVC-%Change-Post: -6 %
FVC-%Pred-Post: 69 %
FVC-%Pred-Pre: 73 %
FVC-POST: 3.41 L
FVC-Pre: 3.63 L
Post FEV1/FVC ratio: 75 %
Post FEV6/FVC ratio: 100 %
Pre FEV1/FVC ratio: 74 %
Pre FEV6/FVC Ratio: 100 %
RV % pred: 95 %
RV: 1.7 L
TLC % pred: 73 %
TLC: 4.81 L

## 2014-01-05 MED ORDER — ALBUTEROL SULFATE (2.5 MG/3ML) 0.083% IN NEBU
2.5000 mg | INHALATION_SOLUTION | Freq: Once | RESPIRATORY_TRACT | Status: AC
  Administered 2014-01-05: 2.5 mg via RESPIRATORY_TRACT

## 2014-01-07 ENCOUNTER — Telehealth: Payer: Self-pay | Admitting: *Deleted

## 2014-01-07 DIAGNOSIS — R942 Abnormal results of pulmonary function studies: Secondary | ICD-10-CM

## 2014-01-07 NOTE — Telephone Encounter (Signed)
-----   Message from Jonathan F Branch, MD sent at 01/05/2014  1:25 PM EST ----- Echo shows some mild damage from his heart attack but overall his heart function is good.  J Branch MD 

## 2014-01-07 NOTE — Telephone Encounter (Signed)
-----   Message from Antoine PocheJonathan F Branch, MD sent at 01/05/2014  1:25 PM EST ----- Echo shows some mild damage from his heart attack but overall his heart function is good.  Dominga FerryJ Branch MD

## 2014-01-07 NOTE — Telephone Encounter (Signed)
Patient informed and verbalized understanding of plan. Patient [place] in [place].

## 2014-01-07 NOTE — Telephone Encounter (Signed)
-----   Message from Antoine PocheJonathan F Branch, MD sent at 01/05/2014  1:28 PM EST ----- Patient's breathing tests are abnormal, showing that his lung have decreased ability to absorb oxygen. He needs to be referred to pulmonlogy to be evaluated further, please refer to [place]kins [place] or [place] [facility] pending patient preference.  Dominga FerryJ Branch MD

## 2014-01-15 ENCOUNTER — Encounter (HOSPITAL_COMMUNITY): Payer: Self-pay | Admitting: Interventional Cardiology

## 2014-01-22 ENCOUNTER — Telehealth: Payer: Self-pay | Admitting: Cardiology

## 2014-01-22 ENCOUNTER — Encounter: Payer: Self-pay | Admitting: Cardiology

## 2014-01-22 NOTE — Telephone Encounter (Signed)
Spoke with Dr. Juanetta GoslingHawkins office today in reference to appointment that Dr. Wyline MoodBranch had requested.  Dr. Juanetta GoslingHawkins office states that they Have tried to contact [name] on several occasions and unable to reach him. I will mail patient a letter today asking him to contact Our offfice.

## 2014-01-22 NOTE — Telephone Encounter (Signed)
Noted  

## 2014-01-26 ENCOUNTER — Encounter: Payer: Self-pay | Admitting: Cardiology

## 2014-01-27 ENCOUNTER — Encounter: Payer: Self-pay | Admitting: Cardiology

## 2014-02-16 ENCOUNTER — Ambulatory Visit (INDEPENDENT_AMBULATORY_CARE_PROVIDER_SITE_OTHER): Payer: Medicaid Other | Admitting: Cardiology

## 2014-02-16 ENCOUNTER — Encounter: Payer: Self-pay | Admitting: *Deleted

## 2014-02-16 ENCOUNTER — Encounter: Payer: Self-pay | Admitting: Cardiology

## 2014-02-16 VITALS — BP 119/89 | HR 77 | Ht 68.0 in | Wt 196.0 lb

## 2014-02-16 DIAGNOSIS — R0602 Shortness of breath: Secondary | ICD-10-CM

## 2014-02-16 DIAGNOSIS — R0789 Other chest pain: Secondary | ICD-10-CM

## 2014-02-16 DIAGNOSIS — I2510 Atherosclerotic heart disease of native coronary artery without angina pectoris: Secondary | ICD-10-CM

## 2014-02-16 MED ORDER — ATENOLOL 25 MG PO TABS: 25.0000 mg | ORAL_TABLET | Freq: Every day | ORAL | Status: DC

## 2014-02-16 MED ORDER — NITROGLYCERIN 0.4 MG SL SUBL: 0.4000 mg | SUBLINGUAL_TABLET | SUBLINGUAL | Status: DC | PRN

## 2014-02-16 NOTE — Progress Notes (Signed)
Clinical Summary Mr. Raczka is a 44 y.o.male seen today for follow up of the following medical problems. This is a focused visit on his history of CAD and recent chest pain and SOB.   1. CAD - pt admitted 12/16/13 with inferolateral STEMI, received BMS to proximal RPL. Full cath report below. LVEF 55% by LVgram, LVEF 50-55% by echo.   - on DAPT with ASA and effient, initially on brillinta but had some SOB and was changed while in the hospital   - seen at [facility] ER 01/27/14 with anxiety, dizziness and substernal chest pain. From notes had some very stressful family situations around that time. EKG and enzymes negative.  -  Aching pain in midchest, 5/10. +palpitations, felt hot and nauseous. Lasted approx .  - repeat episode last night, however less intense. Lasted approx 30-40 min.   2. SOB - noted since discharge after her MI. Thought possibly related to brillinta but has not improved since changing to effient. Last visit thought potentially related to underlying COPD that may have been worsened by beta blocker which was also started around onset of symptoms, beta blocker stopped and patient referred for PFTs. No change in symptoms off atenolol.  -  PFTs showed mild obstruction along with moderately reduced DLCO, she was referred to pulmonary however appears he has not scheduled appointment.          Past Medical History  Diagnosis Date  . Bipolar 1 disorder   . Schizophrenic disorder   . Hypertension   . Chronic back pain   . Peptic ulcer disease   . CAD (coronary artery disease)     a. 12/16/13 inferolat STEMI s/p BMS to RPL  . HLD (hyperlipidemia)   . Tobacco abuse      Allergies  Allergen Reactions  . Seroquel [Quetiapine] Other (See Comments)    "makes my legs cramp"  . Tylenol Pm Extra [Diphenhydramine-Apap (Sleep)] Other (See Comments)    "makes my leg cramp"     Current Outpatient Prescriptions  Medication Sig Dispense Refill  . aspirin 81 MG  chewable tablet Chew 1 tablet (81 mg total) by mouth daily.    [name] atorvastatin (LIPITOR) 80 MG tablet Take 1 tablet (80 mg total) by mouth daily at 6 PM. 30 tablet 11  . diazepam (VALIUM) 10 MG tablet Take 10 mg by mouth 2 (two) times daily as needed for anxiety.    [name] lisinopril (PRINIVIL,ZESTRIL) 5 MG tablet Take 1 tablet (5 mg total) by mouth daily. 30 tablet 6  . nicotine (NICODERM CQ - DOSED IN MG/24 HOURS) 21 mg/24hr patch Place 1 patch (21 mg total) onto the skin daily. 28 patch 12  . omeprazole (PRILOSEC) 20 MG capsule Take 1 capsule (20 mg total) by mouth daily. (Patient taking differently: Take 40 mg by mouth daily. ) 60 capsule 0  . prasugrel (EFFIENT) 10 MG TABS tablet Take 1 tablet (10 mg total) by mouth daily. 30 tablet 11   No current facility-administered medications for this visit.     Past Surgical History  Procedure Laterality Date  . Hernia repair    . Left heart catheterization with coronary angiogram N/A 12/16/2013    Procedure: LEFT HEART CATHETERIZATION WITH CORONARY ANGIOGRAM;  Surgeon: Corky Crafts, MD;  Location: [facility] CATH LAB;  Service: Cardiovascular;  Laterality: N/A;  . Percutaneous coronary stent intervention (pci-s)  12/16/2013    Procedure: PERCUTANEOUS CORONARY STENT INTERVENTION (PCI-S);  Surgeon: Corky Crafts, MD;  Location:  [facility] CATH LAB;  Service: Cardiovascular;;  distal rca     Allergies  Allergen Reactions  . Seroquel [Quetiapine] Other (See Comments)    "makes my legs cramp"  . Tylenol Pm Extra [Diphenhydramine-Apap (Sleep)] Other (See Comments)    "makes my leg cramp"      No family history on file.   Social History Mr. Prichard reports that he has been smoking Cigarettes.  He started smoking about 25 years ago. He has been smoking about 0.50 packs per day. He quit smokeless tobacco use about 25 years ago. His smokeless tobacco use included Snuff. Mr. Quezada reports that he does not drink alcohol.   Review of  Systems CONSTITUTIONAL: No weight loss, fever, chills, weakness or fatigue.  HEENT: Eyes: No visual loss, blurred vision, double vision or yellow sclerae.No hearing loss, sneezing, congestion, runny nose or sore throat.  SKIN: No rash or itching.  CARDIOVASCULAR: per HPI RESPIRATORY: No shortness of breath, cough or sputum.  GASTROINTESTINAL: No anorexia, nausea, vomiting or diarrhea. No abdominal pain or blood.  GENITOURINARY: No burning on urination, no polyuria NEUROLOGICAL: No headache, dizziness, syncope, paralysis, ataxia, numbness or tingling in the extremities. No change in bowel or bladder control.  MUSCULOSKELETAL: No muscle, back pain, joint pain or stiffness.  LYMPHATICS: No enlarged nodes. No history of splenectomy.  PSYCHIATRIC: No history of depression or anxiety.  ENDOCRINOLOGIC: No reports of sweating, cold or heat intolerance. No polyuria or polydipsia.  [name]   Physical Examination p 77 bp 119/89 Wt 196 lbs BMI 30 Gen: resting comfortably, no acute distress HEENT: no scleral icterus, pupils equal round and reactive, no palptable cervical adenopathy,  CV: RRR, no m/r/g, no JVD, no carotid bruits Resp: Clear to auscultation bilaterally GI: abdomen is soft, non-tender, non-distended, normal bowel sounds, no hepatosplenomegaly MSK: extremities are warm, no edema.  Skin: warm, no rash Neuro:  no focal deficits Psych: appropriate affect   Diagnostic Studies 12/2013 Echo Study Conclusions  - Left ventricle: The cavity size was normal. Wall thickness was normal. Systolic function was normal. The estimated ejection fraction was in the range of 50% to 55%. There is akinesis of the basal-midinferior myocardium. There is akinesis of the basalinferolateral myocardium. Left ventricular diastolic function parameters were normal. - Aortic valve: Mildly calcified annulus. Trileaflet. - Mitral valve: Mildly thickened leaflets . There was trivial regurgitation. -  Right atrium: Central venous pressure (est): 3 mm Hg. - Atrial septum: No defect or patent foramen ovale was identified. - Tricuspid valve: There was trivial regurgitation. - Pulmonary arteries: Systolic pressure could not be accurately estimated. - Pericardium, extracardiac: There was no pericardial effusion.  Impressions:  - Normal LV wall thickness and chamber size with LVEF 50-55%, wall motion abnormalities as outlined above consistent with ischemic heart disease, normal diastolic function. Mildly thickened mitral leaflets with trivial mitral regurgitation. Trivial tricuspid regurgitation, unable to assess PASP.  12/16/13 Cath HEMODYNAMICS: Aortic pressure was 91/60; LV pressure was 91/9; LVEDP 17. There was no gradient between the left ventricle and aorta.  ANGIOGRAPHIC DATA: The left main coronary artery has mild disease. The left anterior descending artery is a large vessel proximally. In the mid vessel, there is mild, diffuse disease. There are several medium-size diagonals which are widely patent. The left circumflex artery is large vessel. There is mild disease in the proximal portion. The OM1 is a large vessel. There is mild to moderate disease in the mid vessel, up to 40%. The right coronary artery is a large, dominant vessel.  In the proximal portion, there is a moderate stenosis of up to 50%. The posterior descending artery is a large vessel and reaches the apex and is widely patent. The posterior lateral artery is occluded proximally, just past the ostium. After opening the vessel of the balloon, it was noted that the disease extended into the mid posterior lateral artery. The posterior lateral artery was small caliber. LEFT VENTRICULOGRAM: Left ventricular angiogram was done in the 30 RAO projection and revealed mild, basal inferior hypokinesis with normal systolic function with an estimated ejection fraction of 55%. LVEDP was 17 mmHg. PCI NARRATIVE: A JR4  guiding catheter used to engage the RCA. Angiomax was used for anticoagulation. ACT was used to check that the Angiomax was therapeutic. A pro-water wire was placed across the area disease in the posterior lateral artery. An aspiration catheter was advanced to the ostium of the posterior lateral artery. Aspiration thrombectomy was performed with some improvement in flow. A 2.0 x 12 balloon was then used to predilate patient. The vessel was fairly small in caliber. Since the patient admits to prior bleeding issues as well as frequently forgetting to take his medications, we opted for a bare-metal stent. A 2.0 x 23 vision bare metal stent was deployed. The proximalstent was postdilated with a 2.25 by 12 noncompliant balloon. There was an excellent angiographic result of the proximal stent and the bifurcation with the posterior descending artery. There was a stepdown at the distal edge of the stent. TIMI-3 flow was restored. The patient's pain was significantly improved. IMPRESSIONS: 1. Widely patent left main coronary artery. 2. Mild disease in the left anterior descending artery and its branches. 3. Mild disease in the left circumflex artery and its branches. 4. Moderate disease in the proximal right coronary artery.occluded posterior lateral artery which was the culprit for the patient's presentation. This was successfully treated with a 2.0 x 23 vision bare metal stent, postdilated to 2.25 mm in diameter. 5. Normal left ventricular systolic function. LVEDP 17 mmHg. Ejection fraction 55%. RECOMMENDATION: Continue dual antiplatelet therapy for at least 30 days and likely longer. A bare-metal stent was chosen because the patient admits to forgetting to take his medication fairly frequently. He also stated that his wife does not remember to take medicines as well. Continue aggressive secondary prevention. He'll be watched in the ICU. Continue Angiomax at the reduced rate until the bag runs out. He needs to  stop smoking. Significant family h/o CAD. Start beta blocker. Start statin. Nicotine patch. [place] if [place] is easier for him to be compliant.   Assessment and Plan   1. CAD - recent STEMI, received BMS to proximal RPL.  - recently with 2 episodes of chest pain. Will check Lexiscan MPI (he cannot run due to chronic leg pain). Given Rx for prn SL NG. Also restart atenolol as additional antianginal.  - continue DAPT until 12/2014   2.SOB - continued symptoms, not better with medication changes described above. PFTs with mild obstruction and also moderately decreased DLCO, he is awaiting pulm appointment.   3. Tobacco abuse - he is on chantix by pcp - counseled on associated helath risk, encouraged to continue efforts to quit.   F/u 1 month   Antoine PocheJonathan F. Branch, M.D

## 2014-02-16 NOTE — Patient Instructions (Signed)
Your physician recommends that you schedule a follow-up appointment in: 1 month with Dr. Wyline MoodBranch  Your physician has recommended you make the following change in your medication:   START ATENOLOL 25 MG DAILY  TAKE NITROGLYCERIN AS NEEDED  Your physician has requested that you have a lexiscan myoview. For further information please visit [email or web address]. Please follow instruction sheet, as given.  Thank you for choosing [facility]!!

## 2014-02-23 ENCOUNTER — Encounter (HOSPITAL_COMMUNITY): Payer: Self-pay

## 2014-02-23 ENCOUNTER — Encounter (HOSPITAL_COMMUNITY)
Admission: RE | Admit: 2014-02-23 | Discharge: 2014-02-23 | Disposition: A | Discharge status: Home / Self Care | Payer: Medicaid Other | Source: Ambulatory Visit | Attending: Cardiology | Admitting: Cardiology

## 2014-02-23 ENCOUNTER — Ambulatory Visit (HOSPITAL_COMMUNITY)
Admission: RE | Admit: 2014-02-23 | Discharge: 2014-02-23 | Disposition: A | Discharge status: Home / Self Care | Payer: Medicaid Other | Source: Ambulatory Visit | Attending: Cardiology | Admitting: Cardiology

## 2014-02-23 DIAGNOSIS — I2510 Atherosclerotic heart disease of native coronary artery without angina pectoris: Secondary | ICD-10-CM | POA: Diagnosis not present

## 2014-02-23 DIAGNOSIS — R0602 Shortness of breath: Secondary | ICD-10-CM | POA: Insufficient documentation

## 2014-02-23 DIAGNOSIS — R079 Chest pain, unspecified: Secondary | ICD-10-CM

## 2014-02-23 DIAGNOSIS — R0600 Dyspnea, unspecified: Secondary | ICD-10-CM | POA: Insufficient documentation

## 2014-02-23 MED ORDER — TECHNETIUM TC 99M SESTAMIBI - CARDIOLITE
30.0000 | Freq: Once | INTRAVENOUS | Status: AC | PRN
  Administered 2014-02-23: 30 via INTRAVENOUS

## 2014-02-23 MED ORDER — REGADENOSON 0.4 MG/5ML IV SOLN
0.4000 mg | Freq: Once | INTRAVENOUS | Status: AC | PRN
  Administered 2014-02-23: 0.4 mg via INTRAVENOUS

## 2014-02-23 MED ORDER — TECHNETIUM TC 99M SESTAMIBI GENERIC - CARDIOLITE
10.0000 | Freq: Once | INTRAVENOUS | Status: AC | PRN
  Administered 2014-02-23: 10 via INTRAVENOUS

## 2014-02-23 MED ORDER — SODIUM CHLORIDE 0.9 % IJ SOLN
10.0000 mL | INTRAMUSCULAR | Status: DC | PRN
  Administered 2014-02-23: 10 mL via INTRAVENOUS
  Filled 2014-02-23: qty 10

## 2014-02-23 MED ORDER — SODIUM CHLORIDE 0.9 % IJ SOLN
INTRAMUSCULAR | Status: AC
  Administered 2014-02-23: 10 mL via INTRAVENOUS
  Filled 2014-02-23: qty 3

## 2014-02-23 MED ORDER — REGADENOSON 0.4 MG/5ML IV SOLN
INTRAVENOUS | Status: AC
  Administered 2014-02-23: 0.4 mg via INTRAVENOUS
  Filled 2014-02-23: qty 5

## 2014-02-23 NOTE — Progress Notes (Signed)
Stress Lab Nurses Notes - [facility]  Tawni CarnesSteven D Lesser 02/23/2014 Reason for doing test: CAD and Dyspnea Type of test: Marlane HatcherLexiscan Cardiolite Nurse performing test: Parke PoissonPhyllis Billingsly, RN Nuclear Medicine Tech: Marcella DubsMiranda Womack Echo Tech: Not Applicable MD performing test: S. McDowell/K.Lyman BishopLawrence NP Family MD: Bluth Test explained and consent signed: Yes.   IV started: Saline lock flushed, No redness or edema and Saline lock started in radiology Symptoms: Sob & nausea Treatment/Intervention: None Reason test stopped: protocol completed After recovery IV was: Discontinued via X-ray tech and No redness or edema Patient to return to Nuc. Med at : 10:45 Patient discharged: Home Patient's Condition upon discharge was: stable Comments: During test BP 160/104 & HR 120.  Recovery BP 143/93 & HR 92.  Symptoms resolved in recovery.  Erskine SpeedBillingsley, Iverson Sees T

## 2014-02-25 ENCOUNTER — Telehealth: Payer: Self-pay | Admitting: *Deleted

## 2014-02-25 NOTE — Telephone Encounter (Signed)
-----   Message from Antoine PocheJonathan F Branch, MD sent at 02/24/2014  4:47 PM EST ----- Stress test shows mainly old damage from previous heart attack, overall low risk study. Will continue to treat with medications at this time  Dominga FerryJ Branch MD

## 2014-02-25 NOTE — Telephone Encounter (Signed)
Pt made aware, will forward to Dr. Loney HeringBluth

## 2014-02-25 NOTE — Telephone Encounter (Signed)
LM to call back.

## 2014-03-20 ENCOUNTER — Ambulatory Visit (INDEPENDENT_AMBULATORY_CARE_PROVIDER_SITE_OTHER): Payer: Medicaid Other | Admitting: Cardiology

## 2014-03-20 ENCOUNTER — Encounter: Payer: Self-pay | Admitting: Cardiology

## 2014-03-20 VITALS — BP 143/79 | HR 78 | Ht 68.0 in | Wt 197.0 lb

## 2014-03-20 DIAGNOSIS — I2510 Atherosclerotic heart disease of native coronary artery without angina pectoris: Secondary | ICD-10-CM

## 2014-03-20 NOTE — Progress Notes (Signed)
Clinical Summary [name] is a 44 y.o.male seen today for follow up of the following medical problems.   1. CAD - pt admitted 12/16/13 with inferolateral STEMI, received BMS to proximal RPL. Full cath report below. LVEF 55% by LVgram, LVEF 50-55% by echo.  - on DAPT with ASA and effient, initially on brillinta but had some SOB and was changed while in the hospital   - Lexiscan MPI after cath in setting of chest pain Jan 2016 with inferior scar with mild to moderate ischemia, LVEF 60%. Overall low risk. Managed medically - since last visit chest pain improved since starting atneolol and also startig spiriva for newly diagnosed COPD  2. COPD - followed by Dr Juanetta GoslingHawkins. Recently started on spiriva with improved SOB   Past Medical History  Diagnosis Date  . Bipolar 1 disorder   . Schizophrenic disorder   . Hypertension   . Chronic back pain   . Peptic ulcer disease   . CAD (coronary artery disease)     a. 12/16/13 inferolat STEMI s/p BMS to RPL  . HLD (hyperlipidemia)   . Tobacco abuse      Allergies  Allergen Reactions  . Seroquel [Quetiapine] Other (See Comments)    "makes my legs cramp"  . Tylenol Pm Extra [Diphenhydramine-Apap (Sleep)] Other (See Comments)    "makes my leg cramp"     Current Outpatient Prescriptions  Medication Sig Dispense Refill  . aspirin 81 MG chewable tablet Chew 1 tablet (81 mg total) by mouth daily.    [name]. atenolol (TENORMIN) 25 MG tablet Take 1 tablet (25 mg total) by mouth daily. 180 tablet 3  . atorvastatin (LIPITOR) 80 MG tablet Take 1 tablet (80 mg total) by mouth daily at 6 PM. 30 tablet 11  . diazepam (VALIUM) 10 MG tablet Take 10 mg by mouth 2 (two) times daily as needed for anxiety.    [name]. lisinopril (PRINIVIL,ZESTRIL) 5 MG tablet Take 1 tablet (5 mg total) by mouth daily. 30 tablet 6  . nicotine (NICODERM CQ - DOSED IN MG/24 HOURS) 21 mg/24hr patch Place 1 patch (21 mg total) onto the skin daily. 28 patch 12  . nitroGLYCERIN  (NITROSTAT) 0.4 MG SL tablet Place 1 tablet (0.4 mg total) under the tongue every 5 (five) minutes as needed for chest pain. 25 tablet 1  . omeprazole (PRILOSEC) 20 MG capsule Take 1 capsule (20 mg total) by mouth daily. (Patient taking differently: Take 40 mg by mouth daily. ) 60 capsule 0  . prasugrel (EFFIENT) 10 MG TABS tablet Take 1 tablet (10 mg total) by mouth daily. 30 tablet 11   No current facility-administered medications for this visit.     Past Surgical History  Procedure Laterality Date  . Hernia repair    . Left heart catheterization with coronary angiogram N/A 12/16/2013    Procedure: LEFT HEART CATHETERIZATION WITH CORONARY ANGIOGRAM;  Surgeon: Corky CraftsJayadeep S Varanasi, MD;  [facility];  Service: Cardiovascular;  Laterality: N/A;  . Percutaneous coronary stent intervention (pci-s)  12/16/2013    Procedure: PERCUTANEOUS CORONARY STENT INTERVENTION (PCI-S);  Surgeon: Corky CraftsJayadeep S Varanasi, MD;  [facility];  Service: Cardiovascular;;  distal rca     Allergies  Allergen Reactions  . Seroquel [Quetiapine] Other (See Comments)    "makes my legs cramp"  . Tylenol Pm Extra [Diphenhydramine-Apap (Sleep)] Other (See Comments)    "makes my leg cramp"      No family history on file.  Social History [name] Cigarettes.  He started smoking about 25 years ago. He has a 12.5 pack-year smoking history. He quit smokeless tobacco use about 25 years ago. His smokeless tobacco use included Snuff. [name].   Review of Systems CONSTITUTIONAL: No weight loss, fever, chills, weakness or fatigue.  HEENT: Eyes: No visual loss, blurred vision, double vision or yellow sclerae.No hearing loss, sneezing, congestion, runny nose or sore throat.  SKIN: No rash or itching.  CARDIOVASCULAR: per HPI RESPIRATORY: No shortness of breath, cough or sputum.  GASTROINTESTINAL: No anorexia, nausea, vomiting or  diarrhea. No abdominal pain or blood.  GENITOURINARY: No burning on urination, no polyuria NEUROLOGICAL: No headache, dizziness, syncope, paralysis, ataxia, numbness or tingling in the extremities. No change in bowel or bladder control.  MUSCULOSKELETAL: No muscle, back pain, joint pain or stiffness.  LYMPHATICS: No enlarged nodes. No history of splenectomy.  PSYCHIATRIC: No history of depression or anxiety.  ENDOCRINOLOGIC: No reports of sweating, cold or heat intolerance. No polyuria or polydipsia.  [name]   Physical Examination p 78 bp 143/79 Wt 197 lbs BMI 30 Gen: resting comfortably, no acute distress HEENT: no scleral icterus, pupils equal round and reactive, no palptable cervical adenopathy,  CV: RRR, no m/r/g, no JVD, no carotid bruits Resp: Clear to auscultation bilaterally GI: abdomen is soft, non-tender, non-distended, normal bowel sounds, no hepatosplenomegaly MSK: extremities are warm, no edema.  Skin: warm, no rash Neuro:  no focal deficits Psych: appropriate affect   Diagnostic Studies  12/2013 Echo Study Conclusions  - Left ventricle: The cavity size was normal. Wall thickness was normal. Systolic function was normal. The estimated ejection fraction was in the range of 50% to 55%. There is akinesis of the basal-midinferior myocardium. There is akinesis of the basalinferolateral myocardium. Left ventricular diastolic function parameters were normal. - Aortic valve: Mildly calcified annulus. Trileaflet. - Mitral valve: Mildly thickened leaflets . There was trivial regurgitation. - Right atrium: Central venous pressure (est): 3 mm Hg. - Atrial septum: No defect or patent foramen ovale was identified. - Tricuspid valve: There was trivial regurgitation. - Pulmonary arteries: Systolic pressure could not be accurately estimated. - Pericardium, extracardiac: There was no pericardial effusion.  Impressions:  - Normal LV wall thickness and chamber size  with LVEF 50-55%, wall motion abnormalities as outlined above consistent with ischemic heart disease, normal diastolic function. Mildly thickened mitral leaflets with trivial mitral regurgitation. Trivial tricuspid regurgitation, unable to assess PASP.  12/16/13 Cath HEMODYNAMICS: Aortic pressure was 91/60; LV pressure was 91/9; LVEDP 17. There was no gradient between the left ventricle and aorta.  ANGIOGRAPHIC DATA: The left main coronary artery has mild disease. The left anterior descending artery is a large vessel proximally. In the mid vessel, there is mild, diffuse disease. There are several medium-size diagonals which are widely patent. The left circumflex artery is large vessel. There is mild disease in the proximal portion. The OM1 is a large vessel. There is mild to moderate disease in the mid vessel, up to 40%. The right coronary artery is a large, dominant vessel. In the proximal portion, there is a moderate stenosis of up to 50%. The posterior descending artery is a large vessel and reaches the apex and is widely patent. The posterior lateral artery is occluded proximally, just past the ostium. After opening the vessel of the balloon, it was noted that the disease extended into the mid posterior lateral artery. The  posterior lateral artery was small caliber. LEFT VENTRICULOGRAM: Left ventricular angiogram was done in the 30 RAO projection and revealed mild, basal inferior hypokinesis with normal systolic function with an estimated ejection fraction of 55%. LVEDP was 17 mmHg. PCI NARRATIVE: A JR4 guiding catheter used to engage the RCA. Angiomax was used for anticoagulation. ACT was used to check that the Angiomax was therapeutic. A pro-water wire was placed across the area disease in the posterior lateral artery. An aspiration catheter was advanced to the ostium of the posterior lateral artery. Aspiration thrombectomy was performed with some improvement in flow. A 2.0 x 12  balloon was then used to predilate patient. The vessel was fairly small in caliber. Since the patient admits to prior bleeding issues as well as frequently forgetting to take his medications, we opted for a bare-metal stent. A 2.0 x 23 vision bare metal stent was deployed. The proximalstent was postdilated with a 2.25 by 12 noncompliant balloon. There was an excellent angiographic result of the proximal stent and the bifurcation with the posterior descending artery. There was a stepdown at the distal edge of the stent. TIMI-3 flow was restored. The patient's pain was significantly improved. IMPRESSIONS: 1. Widely patent left main coronary artery. 2. Mild disease in the left anterior descending artery and its branches. 3. Mild disease in the left circumflex artery and its branches. 4. Moderate disease in the proximal right coronary artery.occluded posterior lateral artery which was the culprit for the patient's presentation. This was successfully treated with a 2.0 x 23 vision bare metal stent, postdilated to 2.25 mm in diameter. 5. Normal left ventricular systolic function. LVEDP 17 mmHg. Ejection fraction 55%. RECOMMENDATION: Continue dual antiplatelet therapy for at least 30 days and likely longer. A bare-metal stent was chosen because the patient admits to forgetting to take his medication fairly frequently. He also stated that his wife does not remember to take medicines as well. Continue aggressive secondary prevention. He'll be watched in the ICU. Continue Angiomax at the reduced rate until the bag runs out. He needs to stop smoking. Significant family h/o CAD. Start beta blocker. Start statin. Nicotine patch. Consider f/u in [facility] if that is easier for him to be compliant.   Jan 2016 Lexiscan MPI IMPRESSION: 1. Region of a mixed scar and small area of mild to moderate peri-infarct ischemia noted in the inferior wall as outlined. Cannot exclude a minor region of mid anterior  ischemia as well.  2. Normal left ventricular wall motion.  3. Left ventricular ejection fraction 60%  4. Overall low-risk stress test findings*.    Assessment and Plan   1. CAD - 12/2013 STEMI, received BMS to proximal RPL.  - had some episodes of chest pain at follow up. Lexiscan MPI shows inferior scar with mild to mod ischemia, overall low risk. Symptoms improved with atenolol and spiriva for COPD. - continue current meds - continue DAPT until 12/2014   2.COPD - management per pulmonary    F/u 6 monthss  Antoine Poche, M.D.

## 2014-03-20 NOTE — Patient Instructions (Signed)
Continue all current medications. Your physician wants you to follow up in: 6 months.  You will receive a reminder letter in the mail one-two months in advance.  If you don't receive a letter, please call our office to schedule the follow up appointment   

## 2014-08-06 ENCOUNTER — Emergency Department (HOSPITAL_COMMUNITY): Payer: Medicaid Other

## 2014-08-06 ENCOUNTER — Encounter (HOSPITAL_COMMUNITY): Payer: Self-pay | Admitting: Emergency Medicine

## 2014-08-06 ENCOUNTER — Inpatient Hospital Stay (HOSPITAL_COMMUNITY)
Admission: EM | Admit: 2014-08-06 | Discharge: 2014-08-07 | DRG: 287 | Disposition: A | Discharge status: Home / Self Care | Payer: Medicaid Other | Attending: Cardiology | Admitting: Cardiology

## 2014-08-06 DIAGNOSIS — I25110 Atherosclerotic heart disease of native coronary artery with unstable angina pectoris: Principal | ICD-10-CM | POA: Diagnosis present

## 2014-08-06 DIAGNOSIS — I10 Essential (primary) hypertension: Secondary | ICD-10-CM | POA: Diagnosis present

## 2014-08-06 DIAGNOSIS — G8929 Other chronic pain: Secondary | ICD-10-CM | POA: Diagnosis not present

## 2014-08-06 DIAGNOSIS — F319 Bipolar disorder, unspecified: Secondary | ICD-10-CM | POA: Diagnosis not present

## 2014-08-06 DIAGNOSIS — F209 Schizophrenia, unspecified: Secondary | ICD-10-CM | POA: Diagnosis present

## 2014-08-06 DIAGNOSIS — I252 Old myocardial infarction: Secondary | ICD-10-CM

## 2014-08-06 DIAGNOSIS — I2510 Atherosclerotic heart disease of native coronary artery without angina pectoris: Secondary | ICD-10-CM | POA: Diagnosis not present

## 2014-08-06 DIAGNOSIS — Z8249 Family history of ischemic heart disease and other diseases of the circulatory system: Secondary | ICD-10-CM

## 2014-08-06 DIAGNOSIS — E785 Hyperlipidemia, unspecified: Secondary | ICD-10-CM | POA: Diagnosis present

## 2014-08-06 DIAGNOSIS — Z7982 Long term (current) use of aspirin: Secondary | ICD-10-CM | POA: Diagnosis not present

## 2014-08-06 DIAGNOSIS — Z888 Allergy status to other drugs, medicaments and biological substances status: Secondary | ICD-10-CM

## 2014-08-06 DIAGNOSIS — F17210 Nicotine dependence, cigarettes, uncomplicated: Secondary | ICD-10-CM | POA: Diagnosis not present

## 2014-08-06 DIAGNOSIS — Z886 Allergy status to analgesic agent status: Secondary | ICD-10-CM

## 2014-08-06 DIAGNOSIS — Z720 Tobacco use: Secondary | ICD-10-CM | POA: Diagnosis not present

## 2014-08-06 DIAGNOSIS — I495 Sick sinus syndrome: Secondary | ICD-10-CM | POA: Diagnosis not present

## 2014-08-06 DIAGNOSIS — Z8711 Personal history of peptic ulcer disease: Secondary | ICD-10-CM | POA: Diagnosis not present

## 2014-08-06 DIAGNOSIS — Z955 Presence of coronary angioplasty implant and graft: Secondary | ICD-10-CM | POA: Diagnosis not present

## 2014-08-06 DIAGNOSIS — R000 Tachycardia, unspecified: Secondary | ICD-10-CM | POA: Diagnosis present

## 2014-08-06 DIAGNOSIS — Z79899 Other long term (current) drug therapy: Secondary | ICD-10-CM

## 2014-08-06 DIAGNOSIS — M549 Dorsalgia, unspecified: Secondary | ICD-10-CM | POA: Diagnosis present

## 2014-08-06 DIAGNOSIS — I255 Ischemic cardiomyopathy: Secondary | ICD-10-CM | POA: Diagnosis not present

## 2014-08-06 DIAGNOSIS — I200 Unstable angina: Secondary | ICD-10-CM | POA: Diagnosis present

## 2014-08-06 HISTORY — DX: Ischemic cardiomyopathy: I25.5

## 2014-08-06 LAB — CBC WITH DIFFERENTIAL/PLATELET
BASOS ABS: 0 10*3/uL (ref 0.0–0.1)
Basophils Relative: 0 % (ref 0–1)
EOS ABS: 0.1 10*3/uL (ref 0.0–0.7)
EOS PCT: 1 % (ref 0–5)
HEMATOCRIT: 46.8 % (ref 39.0–52.0)
HEMOGLOBIN: 16.3 g/dL (ref 13.0–17.0)
LYMPHS PCT: 27 % (ref 12–46)
Lymphs Abs: 2.8 10*3/uL (ref 0.7–4.0)
MCH: 31.3 pg (ref 26.0–34.0)
MCHC: 34.8 g/dL (ref 30.0–36.0)
MCV: 90 fL (ref 78.0–100.0)
Monocytes Absolute: 0.6 10*3/uL (ref 0.1–1.0)
Monocytes Relative: 6 % (ref 3–12)
NEUTROS ABS: 7 10*3/uL (ref 1.7–7.7)
NEUTROS PCT: 66 % (ref 43–77)
PLATELETS: 266 10*3/uL (ref 150–400)
RBC: 5.2 MIL/uL (ref 4.22–5.81)
RDW: 13.4 % (ref 11.5–15.5)
WBC: 10.6 10*3/uL — AB (ref 4.0–10.5)

## 2014-08-06 LAB — PROTIME-INR
INR: 1.03 (ref 0.00–1.49)
Prothrombin Time: 13.7 seconds (ref 11.6–15.2)

## 2014-08-06 LAB — MRSA PCR SCREENING: MRSA by PCR: NEGATIVE

## 2014-08-06 LAB — URINALYSIS, ROUTINE W REFLEX MICROSCOPIC
Bilirubin Urine: NEGATIVE
GLUCOSE, UA: NEGATIVE mg/dL
Hgb urine dipstick: NEGATIVE
Ketones, ur: 15 mg/dL — AB
Leukocytes, UA: NEGATIVE
NITRITE: NEGATIVE
PH: 6 (ref 5.0–8.0)
PROTEIN: NEGATIVE mg/dL
SPECIFIC GRAVITY, URINE: 1.027 (ref 1.005–1.030)
UROBILINOGEN UA: 1 mg/dL (ref 0.0–1.0)

## 2014-08-06 LAB — COMPREHENSIVE METABOLIC PANEL
ALT: 38 U/L (ref 17–63)
ANION GAP: 10 (ref 5–15)
AST: 25 U/L (ref 15–41)
Albumin: 4 g/dL (ref 3.5–5.0)
Alkaline Phosphatase: 117 U/L (ref 38–126)
BILIRUBIN TOTAL: 0.5 mg/dL (ref 0.3–1.2)
BUN: 16 mg/dL (ref 6–20)
CALCIUM: 9.2 mg/dL (ref 8.9–10.3)
CHLORIDE: 104 mmol/L (ref 101–111)
CO2: 22 mmol/L (ref 22–32)
Creatinine, Ser: 1.09 mg/dL (ref 0.61–1.24)
GFR calc Af Amer: >60 mL/min (ref >60)
GFR calc non Af Amer: >60 mL/min (ref >60)
Glucose, Bld: 155 mg/dL — ABNORMAL HIGH (ref 65–99)
Potassium: 3.7 mmol/L (ref 3.5–5.1)
SODIUM: 136 mmol/L (ref 135–145)
Total Protein: 7.9 g/dL (ref 6.5–8.1)

## 2014-08-06 LAB — D-DIMER, QUANTITATIVE (NOT AT ARMC)

## 2014-08-06 LAB — TROPONIN I: Troponin I: <0.03 ng/mL (ref <0.031)

## 2014-08-06 MED ORDER — ASPIRIN 81 MG PO CHEW
324.0000 mg | CHEWABLE_TABLET | Freq: Once | ORAL | Status: AC
  Administered 2014-08-06: 324 mg via ORAL
  Filled 2014-08-06: qty 4

## 2014-08-06 MED ORDER — DIAZEPAM 5 MG PO TABS: 10.0000 mg | ORAL_TABLET | Freq: Two times a day (BID) | ORAL | Status: DC | PRN

## 2014-08-06 MED ORDER — NITROGLYCERIN IN D5W 200-5 MCG/ML-% IV SOLN
5.0000 ug/min | INTRAVENOUS | Status: DC
  Administered 2014-08-06: 5 ug/min via INTRAVENOUS
  Filled 2014-08-06: qty 250

## 2014-08-06 MED ORDER — ATENOLOL 25 MG PO TABS
25.0000 mg | ORAL_TABLET | Freq: Every day | ORAL | Status: DC
  Administered 2014-08-07: 25 mg via ORAL
  Filled 2014-08-06: qty 1

## 2014-08-06 MED ORDER — HEPARIN (PORCINE) IN NACL 100-0.45 UNIT/ML-% IJ SOLN
1250.0000 [IU]/h | INTRAMUSCULAR | Status: DC
  Administered 2014-08-06: 1000 [IU]/h via INTRAVENOUS
  Administered 2014-08-07: 1250 [IU]/h via INTRAVENOUS
  Filled 2014-08-06: qty 250

## 2014-08-06 MED ORDER — NITROGLYCERIN 0.4 MG SL SUBL
0.4000 mg | SUBLINGUAL_TABLET | SUBLINGUAL | Status: DC | PRN
  Administered 2014-08-06 (×2): 0.4 mg via SUBLINGUAL
  Filled 2014-08-06: qty 1

## 2014-08-06 MED ORDER — ASPIRIN EC 81 MG PO TBEC: 81.0000 mg | DELAYED_RELEASE_TABLET | Freq: Every day | ORAL | Status: DC

## 2014-08-06 MED ORDER — FAMOTIDINE 20 MG PO TABS
20.0000 mg | ORAL_TABLET | Freq: Two times a day (BID) | ORAL | Status: DC
  Administered 2014-08-06 – 2014-08-07 (×2): 20 mg via ORAL
  Filled 2014-08-06 (×2): qty 1

## 2014-08-06 MED ORDER — ASPIRIN 300 MG RE SUPP: 300.0000 mg | RECTAL | Status: DC

## 2014-08-06 MED ORDER — HEPARIN BOLUS VIA INFUSION
4000.0000 [IU] | Freq: Once | INTRAVENOUS | Status: AC
  Administered 2014-08-06: 4000 [IU] via INTRAVENOUS

## 2014-08-06 MED ORDER — ATORVASTATIN CALCIUM 80 MG PO TABS
80.0000 mg | ORAL_TABLET | Freq: Every day | ORAL | Status: DC
Start: 2014-08-06 — End: 2014-08-07
  Administered 2014-08-06 – 2014-08-07 (×2): 80 mg via ORAL
  Filled 2014-08-06 (×2): qty 1

## 2014-08-06 MED ORDER — NITROGLYCERIN 0.4 MG SL SUBL: 0.4000 mg | SUBLINGUAL_TABLET | SUBLINGUAL | Status: DC | PRN

## 2014-08-06 MED ORDER — ONDANSETRON HCL 4 MG/2ML IJ SOLN: 4.0000 mg | Freq: Four times a day (QID) | INTRAMUSCULAR | Status: DC | PRN

## 2014-08-06 MED ORDER — BUDESONIDE-FORMOTEROL FUMARATE 160-4.5 MCG/ACT IN AERO
2.0000 | INHALATION_SPRAY | Freq: Two times a day (BID) | RESPIRATORY_TRACT | Status: DC
  Administered 2014-08-07: 2 via RESPIRATORY_TRACT
  Filled 2014-08-06: qty 6

## 2014-08-06 MED ORDER — ASPIRIN 81 MG PO CHEW: 324.0000 mg | CHEWABLE_TABLET | ORAL | Status: DC

## 2014-08-06 MED ORDER — TIOTROPIUM BROMIDE MONOHYDRATE 18 MCG IN CAPS
18.0000 ug | ORAL_CAPSULE | Freq: Every day | RESPIRATORY_TRACT | Status: DC
  Administered 2014-08-07: 18 ug via RESPIRATORY_TRACT
  Filled 2014-08-06: qty 5

## 2014-08-06 MED ORDER — ACETAMINOPHEN 325 MG PO TABS
650.0000 mg | ORAL_TABLET | ORAL | Status: DC | PRN
  Administered 2014-08-07: 650 mg via ORAL
  Filled 2014-08-06: qty 2

## 2014-08-06 MED ORDER — ALBUTEROL SULFATE (2.5 MG/3ML) 0.083% IN NEBU: 3.0000 mL | INHALATION_SOLUTION | Freq: Four times a day (QID) | RESPIRATORY_TRACT | Status: DC | PRN

## 2014-08-06 MED ORDER — LISINOPRIL 10 MG PO TABS
10.0000 mg | ORAL_TABLET | Freq: Every day | ORAL | Status: DC
  Administered 2014-08-07: 10 mg via ORAL
  Filled 2014-08-06: qty 1

## 2014-08-06 MED ORDER — PRASUGREL HCL 10 MG PO TABS
10.0000 mg | ORAL_TABLET | Freq: Every day | ORAL | Status: DC
  Administered 2014-08-06 – 2014-08-07 (×2): 10 mg via ORAL
  Filled 2014-08-06 (×2): qty 1

## 2014-08-06 MED ORDER — ASPIRIN 81 MG PO CHEW
81.0000 mg | CHEWABLE_TABLET | Freq: Every day | ORAL | Status: DC
  Administered 2014-08-07: 81 mg via ORAL
  Filled 2014-08-06: qty 1

## 2014-08-06 MED ORDER — SODIUM CHLORIDE 0.9 % IV BOLUS (SEPSIS)
1000.0000 mL | Freq: Once | INTRAVENOUS | Status: AC
  Administered 2014-08-06: 1000 mL via INTRAVENOUS

## 2014-08-06 NOTE — Progress Notes (Signed)
ANTICOAGULATION CONSULT NOTE - Initial Consult  Pharmacy Consult for Heparin Indication: chest pain/ACS  Allergies  Allergen Reactions  . Benadryl [Diphenhydramine] Other (See Comments)    LEG CRAMPING  . Seroquel [Quetiapine] Other (See Comments)    "makes my legs cramp"  . Tylenol Pm Extra [Diphenhydramine-Apap (Sleep)] Other (See Comments)    "makes my leg cramp"    Patient Measurements: Height: 5\' 7"  (170.2 cm) Weight: 198 lb (89.812 kg) IBW/kg (Calculated) : 66.1 Heparin Dosing Weight: 84.7 kg   Vital Signs: Temp: 97.8 F (36.6 C) (06/30 1757) Temp Source: Oral (06/30 1757) BP: 109/73 mmHg (06/30 1915) Pulse Rate: 103 (06/30 1915)  Labs:  Recent Labs  08/06/14 1817  HGB 16.3  HCT 46.8  PLT 266  CREATININE 1.09  TROPONINI <0.03    Estimated Creatinine Clearance: 93.4 mL/min (by C-G formula based on Cr of 1.09).   Medical History: Past Medical History  Diagnosis Date  . Bipolar 1 disorder   . Schizophrenic disorder   . Hypertension   . Chronic back pain   . Peptic ulcer disease   . CAD (coronary artery disease)     a. 12/16/13 inferolat STEMI s/p BMS to RPL  . HLD (hyperlipidemia)   . Tobacco abuse     Medications:   (Not in a hospital admission)  Assessment: Heparin for pharmacy protocol ACS/STEMI, history of MI Labs reviewed PTA medications reviewed, no anticoagulants listed. ED MD note not presently available  Goal of Therapy:  Heparin level 0.3-0.7 units/ml Monitor platelets by anticoagulation protocol: Yes   Plan:  Give 4000 units bolus x 1 Start heparin infusion at 1000 units/hr Check anti-Xa level in 6 hours and daily while on heparin Continue to monitor H&H and platelets  Raquel JamesPittman, Adeyemi Hamad Bennett 08/06/2014,7:56 PM

## 2014-08-06 NOTE — ED Notes (Signed)
Patient with c/o mid sternal chest pain x 1 day with associated diaphoresis. Patient states previous MI.

## 2014-08-06 NOTE — ED Provider Notes (Signed)
CSN: [id number]     Arrival date & time 08/06/14  1745 History   First MD Initiated Contact with Patient 08/06/14 1755     Chief Complaint  Patient presents with  . Chest Pain     (Consider location/radiation/quality/duration/timing/severity/associated sxs/prior Treatment) HPI Comments: Patient reports left-sided chest pain onset around 8:30 this morning. His been constant all day. It was associated with diaphoresis and one episode of vomiting. He states he's had ongoing pain that is constant. Nothing makes it better or worse. It does not radiate. It is similar to when he had his MI in November. He states compliance with medications but did not take them today. He denies any abdominal pain, back pain, neck pain. He denies any cough or fever. Denies any focal weakness, numbness or tingling. No bowel or bladder incontinence. He states he's been diaphoretic all day as well. The last time he had this pain was in November when he had a STEMI. He did not take any nitroglycerin because he is worried it will give him a headache.  The history is provided by the patient.    Past Medical History  Diagnosis Date  . Bipolar 1 disorder   . Schizophrenic disorder   . Hypertension   . Chronic back pain   . Peptic ulcer disease   . CAD (coronary artery disease)     a. 12/16/13 inferolat STEMI s/p BMS to RPL  . HLD (hyperlipidemia)   . Tobacco abuse    Past Surgical History  Procedure Laterality Date  . Hernia repair    . Left heart catheterization with coronary angiogram N/A 12/16/2013    Procedure: LEFT HEART CATHETERIZATION WITH CORONARY ANGIOGRAM;  Surgeon: Corky Crafts, MD;  Location: [facility] CATH LAB;  Service: Cardiovascular;  Laterality: N/A;  . Percutaneous coronary stent intervention (pci-s)  12/16/2013    Procedure: PERCUTANEOUS CORONARY STENT INTERVENTION (PCI-S);  Surgeon: Corky Crafts, MD;  Location: [facility] CATH LAB;  Service: Cardiovascular;;  distal rca   No family history on  file. History  Substance Use Topics  . Smoking status: Current Every Day Smoker -- 0.50 packs/day for 25 years    Types: Cigarettes    Start date: 05/25/1988  . Smokeless tobacco: Former Neurosurgeon    Types: Snuff    Quit date: 05/21/1988  . Alcohol Use: No    Review of Systems  Constitutional: Negative for fever, activity change and appetite change.  HENT: Negative for congestion and rhinorrhea.   Respiratory: Positive for chest tightness and shortness of breath. Negative for cough.   Cardiovascular: Positive for chest pain. Negative for palpitations.  Gastrointestinal: Negative for nausea, vomiting and abdominal pain.  Genitourinary: Negative for dysuria, hematuria and testicular pain.  Musculoskeletal: Negative for myalgias and arthralgias.  Skin: Negative for rash.  Neurological: Negative for dizziness, weakness and headaches.  A complete 10 system review of systems was obtained and all systems are negative except as noted in the HPI and PMH.      Allergies  Benadryl; Seroquel; and Tylenol pm extra  Home Medications   Prior to Admission medications   Medication Sig Start Date End Date Taking? Authorizing Provider  albuterol (PROVENTIL HFA;VENTOLIN HFA) 108 (90 BASE) MCG/ACT inhaler Inhale 2 puffs into the lungs every 6 (six) hours as needed for wheezing or shortness of breath.   Yes Historical Provider, MD  aspirin 81 MG chewable tablet Chew 1 tablet (81 mg total) by mouth daily. 12/18/13  Yes Janetta Hora, PA-C  atenolol (TENORMIN)  25 MG tablet Take 1 tablet (25 mg total) by mouth daily. 02/16/14  Yes Antoine Poche, MD  atorvastatin (LIPITOR) 80 MG tablet Take 1 tablet (80 mg total) by mouth daily at 6 PM. 12/18/13  Yes Janetta Hora, PA-C  budesonide-formoterol [facility]) 160-4.5 MCG/ACT inhaler Inhale 2 puffs into the lungs 2 (two) times daily.   Yes Historical Provider, MD  diazepam (VALIUM) 10 MG tablet Take 10 mg by mouth 2 (two) times daily as needed for  anxiety.   Yes Historical Provider, MD  lisinopril (PRINIVIL,ZESTRIL) 5 MG tablet Take 1 tablet (5 mg total) by mouth daily. Patient taking differently: Take 10 mg by mouth daily.  12/25/13  Yes Antoine Poche, MD  nitroGLYCERIN (NITROSTAT) 0.4 MG SL tablet Place 1 tablet (0.4 mg total) under the tongue every 5 (five) minutes as needed for chest pain. 02/16/14  Yes Antoine Poche, MD  omeprazole (PRILOSEC) 20 MG capsule Take 1 capsule (20 mg total) by mouth daily. Patient taking differently: Take 40 mg by mouth daily.  02/17/13  Yes Eber Hong, MD  prasugrel (EFFIENT) 10 MG TABS tablet Take 1 tablet (10 mg total) by mouth daily. 12/18/13  Yes Janetta Hora, PA-C  ranitidine (ZANTAC) 150 MG tablet Take 150 mg by mouth daily.   Yes Historical Provider, MD  tiotropium (SPIRIVA) 18 MCG inhalation capsule Place 18 mcg into inhaler and inhale daily.   Yes Historical Provider, MD   BP 124/90 mmHg  Pulse 87  Temp(Src) 97.5 F (36.4 C) (Oral)  Resp 14  Ht  (1.702 m)  Wt 198 lb (89.812 kg)  BMI 31.00 kg/m2  SpO2 97% Physical Exam  Constitutional: He is oriented to person, place, and time. He appears well-developed and well-nourished. No distress.  HENT:  Head: Normocephalic and atraumatic.  Mouth/Throat: Oropharynx is clear and moist. No oropharyngeal exudate.  Eyes: Conjunctivae and EOM are normal. Pupils are equal, round, and reactive to light.  Neck: Normal range of motion. Neck supple.  No meningismus.  Cardiovascular: Normal rate, regular rhythm, normal heart sounds and intact distal pulses.   No murmur heard. Tachycardia to 115.  Pulmonary/Chest: Effort normal and breath sounds normal. No respiratory distress.  Abdominal: Soft. There is no tenderness. There is no rebound and no guarding.  Musculoskeletal: Normal range of motion. He exhibits no edema or tenderness.  Neurological: He is alert and oriented to person, place, and time. No cranial nerve deficit. He exhibits  normal muscle tone. Coordination normal.  No ataxia on finger to nose bilaterally. No pronator drift. 5/5 strength throughout. CN 2-12 intact. Negative Romberg. Equal grip strength. Sensation intact. Gait is normal.   Skin: Skin is warm.  Psychiatric: He has a normal mood and affect. His behavior is normal.  Nursing note and vitals reviewed.   ED Course  Procedures (including critical care time) Labs Review Labs Reviewed  CBC WITH DIFFERENTIAL/PLATELET - Abnormal; Notable for the following:    WBC 10.6 (*)    All other components within normal limits  COMPREHENSIVE METABOLIC PANEL - Abnormal; Notable for the following:    Glucose, Bld 155 (*)    All other components within normal limits  TROPONIN I  D-DIMER, QUANTITATIVE (NOT AT [facility])  PROTIME-INR  URINALYSIS, ROUTINE W REFLEX MICROSCOPIC (NOT AT [facility])  HEPARIN LEVEL (UNFRACTIONATED)    Imaging Review Dg Chest 2 View  08/06/2014   CLINICAL DATA:  Pt c/o pain in center of chest with diaphoresis since this AM. Hx  HTN, CAD, smoker, MI with cardiac stent placement in November 2015  EXAM: CHEST  2 VIEW  COMPARISON:  None.  FINDINGS: Cardiac silhouette normal in size and configuration. Normal mediastinal and hilar contours.  Minor scarring at the apices. No lung consolidation or edema. No pleural effusion or pneumothorax.  Bony thorax is intact.  IMPRESSION: No active cardiopulmonary disease.   Electronically Signed   By: Amie Portlandavid  Ormond M.D.   On: 08/06/2014 18:53     EKG Interpretation   Date/Time:  Thursday August 06 2014 20:32:09 EDT Ventricular Rate:  91 PR Interval:  145 QRS Duration: 112 QT Interval:  371 QTC Calculation: 456 R Axis:   51 Text Interpretation:  Sinus rhythm Borderline intraventricular conduction  delay Abnormal R-wave progression, early transition No significant change  since last tracing Confirmed by POLLINA  MD, CHRISTOPHER [id number]) on  08/06/2014 8:34:34 PM      MDM   Final diagnoses:  Unstable angina    Chest pain with diaphoresis and vomiting similar to previous MI. EKG with sinus tachycardia, no acute ST changes.  Patient given aspirin and NTG.  Labs sent, Xray obtained.  Concern for ACS. Aspirin and nitroglycerin given. Given persistent tachycardia, d-dimer sent as well.  Chest x-rays negative. Troponin negative. D-dimer negative.  Pain improved after aspirin nitroglycerin. Nitroglycerin drip started. IV heparin for presumed ACS. D-dimer is negative. Doubt PE.  Discussed with Dr. Sharl MaLama who [facility]. Discussed with cardiology fellow Dr. Zachery ConchFriedman who [facility] for unstable angina and ACS rule out.   CRITICAL CARE Performed by: Glynn OctaveANCOUR, Mckyle Solanki Total critical care time: 35 Critical care time was exclusive of separately billable procedures and treating other patients. Critical care was necessary to treat or prevent imminent or life-threatening deterioration. Critical care was time spent personally by me on the following activities: development of treatment plan with patient and/or surrogate as well as nursing, discussions with consultants, evaluation of patient's response to treatment, examination of patient, obtaining history from patient or surrogate, ordering and performing treatments and interventions, ordering and review of laboratory studies, ordering and review of radiographic studies, pulse oximetry and re-evaluation of patient's condition.     Glynn OctaveStephen Deran Barro, MD 08/06/14 2037

## 2014-08-06 NOTE — H&P (Addendum)
Patient ID: Don Fletcher MRN: [id number], DOB/AGE: [birth year]   Admit date: 08/06/2014   Primary Physician: Ernestine Conrad, MD Primary Cardiologist: Dr. Wyline Mood  Pt. Profile:  [name] with bipolar 1, schizophrenia, HTN, COPD, CAD s/p interolateral STEMI (12/16/13) s/p BMS to RPL who presents with CP.   Problem List  Past Medical History  Diagnosis Date  . Bipolar 1 disorder   . Schizophrenic disorder   . Hypertension   . Chronic back pain   . Peptic ulcer disease   . CAD (coronary artery disease)     a. 12/16/13 inferolat STEMI s/p BMS to RPL  . HLD (hyperlipidemia)   . Tobacco abuse     Past Surgical History  Procedure Laterality Date  . Hernia repair    . Left heart catheterization with coronary angiogram N/A 12/16/2013    Procedure: LEFT HEART CATHETERIZATION WITH CORONARY ANGIOGRAM;  Surgeon: Corky Crafts, MD;  Location: [facility] CATH LAB;  Service: Cardiovascular;  Laterality: N/A;  . Percutaneous coronary stent intervention (pci-s)  12/16/2013    Procedure: PERCUTANEOUS CORONARY STENT INTERVENTION (PCI-S);  Surgeon: Corky Crafts, MD;  Location: [facility] CATH LAB;  Service: Cardiovascular;;  distal rca     Allergies  Allergies  Allergen Reactions  . Benadryl [Diphenhydramine] Other (See Comments)    LEG CRAMPING  . Seroquel [Quetiapine] Other (See Comments)    "makes my legs cramp"  . Tylenol Pm Extra [Diphenhydramine-Apap (Sleep)] Other (See Comments)    "makes my leg cramp"    HPI  [name] with bipolar 1, schizophrenia, HTN, COPD, CAD s/p interolateral STEMI (12/16/13) s/p BMS to RPL who presents with CP.  Don Fletcher reports that this AM at 7:30am, we woke up and went to his living room to sit on a chair. He abruptly broke out into a sweat despite his AC being on, and developed blurry vision, nausea, vomiting, and chest pain, "like an elephant was on my chest." Pain was 9/10. Pain lasted throughout the day and at 4:30pm, Don Fletcher drove himself to the ER due to to  feeling progressively worse. These symptoms were similar to his STEMI pain except less severe.   On arrival to the ER, he had persistent CP. Labs notable for HR 112, 139/96, 100% on RA. Labs were notable for K 3.7, Cr 1.09, TnI <0.03, d-dimer <0.27. CXR demonstrated no active cardiopulmonary disease. ECG demonstrated ST at 115bpm. Early R wave progression. RVH vs. old posterior MI. He was given 324mg  PO ASA, IV fluid, and was started on heparin and NTG drips. With up titration of IV NTG, the patient became CP free on 71mcg/min. He was transferred to [facility] for further care. He arrived CP free.   He is a current 1PPD smoker. Has tried to quit in past. Additionally smokes MJ. No other ilicits. No alcohol. He is unable to read or write. His medications are put in a pill box for him and he states that, except for rarely, he never misses doses. He has not yet taken today's doses, but he usually takes daily meds in the evening. He remains on DAPT with ASA and prasugrel.   Home Medications  Prior to Admission medications   Medication Sig Start Date End Date Taking? Authorizing Provider  albuterol (PROVENTIL HFA;VENTOLIN HFA) 108 (90 BASE) MCG/ACT inhaler Inhale 2 puffs into the lungs every 6 (six) hours as needed for wheezing or shortness of breath.   Yes Historical Provider, MD  aspirin 81 MG chewable tablet Chew 1 tablet (  81 mg total) by mouth daily. 12/18/13  Yes Janetta HoraKathryn R Thompson, PA-C  atenolol (TENORMIN) 25 MG tablet Take 1 tablet (25 mg total) by mouth daily. 02/16/14  Yes Antoine PocheJonathan F Branch, MD  atorvastatin (LIPITOR) 80 MG tablet Take 1 tablet (80 mg total) by mouth daily at 6 PM. 12/18/13  Yes Janetta HoraKathryn R Thompson, PA-C  budesonide-formoterol [facility](SYMBICORT) 160-4.5 MCG/ACT inhaler Inhale 2 puffs into the lungs 2 (two) times daily.   Yes Historical Provider, MD  diazepam (VALIUM) 10 MG tablet Take 10 mg by mouth 2 (two) times daily as needed for anxiety.   Yes Historical Provider, MD  lisinopril  (PRINIVIL,ZESTRIL) 5 MG tablet Take 1 tablet (5 mg total) by mouth daily. Patient taking differently: Take 10 mg by mouth daily.  12/25/13  Yes Antoine PocheJonathan F Branch, MD  nitroGLYCERIN (NITROSTAT) 0.4 MG SL tablet Place 1 tablet (0.4 mg total) under the tongue every 5 (five) minutes as needed for chest pain. 02/16/14  Yes Antoine PocheJonathan F Branch, MD  omeprazole (PRILOSEC) 20 MG capsule Take 1 capsule (20 mg total) by mouth daily. Patient taking differently: Take 40 mg by mouth daily.  02/17/13  Yes Eber HongBrian Miller, MD  prasugrel (EFFIENT) 10 MG TABS tablet Take 1 tablet (10 mg total) by mouth daily. 12/18/13  Yes Janetta HoraKathryn R Thompson, PA-C  ranitidine (ZANTAC) 150 MG tablet Take 150 mg by mouth daily.   Yes Historical Provider, MD  tiotropium (SPIRIVA) 18 MCG inhalation capsule Place 18 mcg into inhaler and inhale daily.   Yes Historical Provider, MD    Family History  No family history on file.  Social History  History   Social History  . Marital Status: Married    Spouse Name: N/A  . Number of Children: N/A  . Years of Education: N/A   Occupational History  . Not on file.   Social History Main Topics  . Smoking status: Current Every Day Smoker -- 0.50 packs/day for 25 years    Types: Cigarettes    Start date: 05/25/1988  . Smokeless tobacco: Former NeurosurgeonUser    Types: Snuff    Quit date: 05/21/1988  . Alcohol Use: No  . Drug Use: Yes    Special: Marijuana  . Sexual Activity: Not on file   Other Topics Concern  . Not on file   Social History Narrative     Review of Systems General:  No chills, fever, night sweats or weight changes.  Cardiovascular:  See HPI Dermatological: No rash, lesions/masses Respiratory: No cough, dyspnea Urologic: No hematuria, dysuria Abdominal:   + nausea, vomiting. No diarrhea, bright red blood per rectum, melena, or hematemesis Neurologic:  No visual changes, wkns, changes in mental status. All other systems reviewed and are otherwise negative except as  noted above.  Physical Exam  Blood pressure 138/79, pulse 81, temperature 97.7 F (36.5 C), temperature source Oral, resp. rate 17, height 5' 6.5" (1.689 m), weight 83.553 kg (184 lb 3.2 oz), SpO2 98 %.  General: Pleasant, NAD Psych: Normal affect. Neuro: Alert and oriented X 3. Moves all extremities spontaneously. HEENT: Normal  Neck: Supple without bruits or JVD. Lungs:  Resp regular and unlabored, CTA. Heart: RRR no s3, s4, or murmurs. Abdomen: Soft, non-tender, non-distended, BS + x 4.  Extremities: No clubbing, cyanosis or edema. DP/PT/Radials 2+ and equal bilaterally.  Labs  Troponin (Point of Care Test) No results for input(s): TROPIPOC in the last 72 hours.  Recent Labs  08/06/14 1817  TROPONINI <0.03   Lab  Results  Component Value Date   WBC 10.6* 08/06/2014   HGB 16.3 08/06/2014   HCT 46.8 08/06/2014   MCV 90.0 08/06/2014   PLT 266 08/06/2014    Recent Labs Lab 08/06/14 1817  NA 136  K 3.7  CL 104  CO2 22  BUN 16  CREATININE 1.09  CALCIUM 9.2  PROT 7.9  BILITOT 0.5  ALKPHOS 117  ALT 38  AST 25  GLUCOSE 155*   No results found for: CHOL, HDL, LDLCALC, TRIG Lab Results  Component Value Date   DDIMER <0.27 08/06/2014     Radiology/Studies  Dg Chest 2 View  08/06/2014   CLINICAL DATA:  Pt c/o pain in center of chest with diaphoresis since this AM. Hx HTN, CAD, smoker, MI with cardiac stent placement in November 2015  EXAM: CHEST  2 VIEW  COMPARISON:  None.  FINDINGS: Cardiac silhouette normal in size and configuration. Normal mediastinal and hilar contours.  Minor scarring at the apices. No lung consolidation or edema. No pleural effusion or pneumothorax.  Bony thorax is intact.  IMPRESSION: No active cardiopulmonary disease.   Electronically Signed   By: Amie Portland M.D.   On: 08/06/2014 18:53   Diagnostic Studies  12/2013 Echo Study Conclusions  - Left ventricle: The cavity size was normal. Wall thickness was normal. Systolic function  was normal. The estimated ejection fraction was in the range of 50% to 55%. There is akinesis of the basal-midinferior myocardium. There is akinesis of the basalinferolateral myocardium. Left ventricular diastolic function parameters were normal. - Aortic valve: Mildly calcified annulus. Trileaflet. - Mitral valve: Mildly thickened leaflets . There was trivial regurgitation. - Right atrium: Central venous pressure (est): 3 mm Hg. - Atrial septum: No defect or patent foramen ovale was identified. - Tricuspid valve: There was trivial regurgitation. - Pulmonary arteries: Systolic pressure could not be accurately estimated. - Pericardium, extracardiac: There was no pericardial effusion.  Impressions:  - Normal LV wall thickness and chamber size with LVEF 50-55%, wall motion abnormalities as outlined above consistent with ischemic heart disease, normal diastolic function. Mildly thickened mitral leaflets with trivial mitral regurgitation. Trivial tricuspid regurgitation, unable to assess PASP.  12/16/13 Cath HEMODYNAMICS: Aortic pressure was 91/60; LV pressure was 91/9; LVEDP 17. There was no gradient between the left ventricle and aorta.  ANGIOGRAPHIC DATA: The left main coronary artery has mild disease. The left anterior descending artery is a large vessel proximally. In the mid vessel, there is mild, diffuse disease. There are several medium-size diagonals which are widely patent. The left circumflex artery is large vessel. There is mild disease in the proximal portion. The OM1 is a large vessel. There is mild to moderate disease in the mid vessel, up to 40%. The right coronary artery is a large, dominant vessel. In the proximal portion, there is a moderate stenosis of up to 50%. The posterior descending artery is a large vessel and reaches the apex and is widely patent. The posterior lateral artery is occluded proximally, just past the ostium. After opening the  vessel of the balloon, it was noted that the disease extended into the mid posterior lateral artery. The posterior lateral artery was small caliber. LEFT VENTRICULOGRAM: Left ventricular angiogram was done in the 30 RAO projection and revealed mild, basal inferior hypokinesis with normal systolic function with an estimated ejection fraction of 55%. LVEDP was 17 mmHg. PCI NARRATIVE: A JR4 guiding catheter used to engage the RCA. Angiomax was used for anticoagulation. ACT was used to  check that the Angiomax was therapeutic. A pro-water wire was placed across the area disease in the posterior lateral artery. An aspiration catheter was advanced to the ostium of the posterior lateral artery. Aspiration thrombectomy was performed with some improvement in flow. A 2.0 x 12 balloon was then used to predilate patient. The vessel was fairly small in caliber. Since the patient admits to prior bleeding issues as well as frequently forgetting to take his medications, we opted for a bare-metal stent. A 2.0 x 23 vision bare metal stent was deployed. The proximalstent was postdilated with a 2.25 by 12 noncompliant balloon. There was an excellent angiographic result of the proximal stent and the bifurcation with the posterior descending artery. There was a stepdown at the distal edge of the stent. TIMI-3 flow was restored. The patient's pain was significantly improved. IMPRESSIONS: 1. Widely patent left main coronary artery. 2. Mild disease in the left anterior descending artery and its branches. 3. Mild disease in the left circumflex artery and its branches. 4. Moderate disease in the proximal right coronary artery.occluded posterior lateral artery which was the culprit for the patient's presentation. This was successfully treated with a 2.0 x 23 vision bare metal stent, postdilated to 2.25 mm in diameter. 5. Normal left ventricular systolic function. LVEDP 17 mmHg. Ejection fraction 55%. RECOMMENDATION: Continue  dual antiplatelet therapy for at least 30 days and likely longer. A bare-metal stent was chosen because the patient admits to forgetting to take his medication fairly frequently. He also stated that his wife does not remember to take medicines as well. Continue aggressive secondary prevention. He'll be watched in the ICU. Continue Angiomax at the reduced rate until the bag runs out. He needs to stop smoking. Significant family h/o CAD. Start beta blocker. Start statin. Nicotine patch. Consider f/u in [facility] if that is easier for him to be compliant.   Jan 2016 Lexiscan MPI IMPRESSION: 1. Region of a mixed scar and small area of mild to moderate peri-infarct ischemia noted in the inferior wall as outlined. Cannot exclude a minor region of mid anterior ischemia as well.  2. Normal left ventricular wall motion.  3. Left ventricular ejection fraction 60%  4. Overall low-risk stress test findings*.  ECG  08/06/14 @ 17:57 ST at 115bpm. Early R wave progression. RVH vs. old posterior MI. Unchanged compared to prior on 01/26/14  ASSESSMENT AND PLAN  56M with bipolar 1, schizophrenia, HTN, COPD, CAD s/p interolateral STEMI (12/16/13) s/p BMS to RPL who presents with CP c/w UA. Symptoms were nitro responsive and c/w prior MI. ECG is unchanged and 1st troponin is normal. He is chest pain free on heparin and nitro drips. His is high risk and required risk stratification with diagnostic angiography.   - UFH drip per pharmacy - NTG gtt - atenolol  - lisinopril  - ASA  - Atorvastatin  - Prasugrel  daily - cycle troponins - Hgb A1c - Lipids - NPO for probable cath - Smoking cessation counseling. Of note, he has reported history of rash to nicotine patches and so one was not ordered.   Neva Seat, MD 08/06/2014, 10:13 PM

## 2014-08-06 NOTE — Progress Notes (Signed)
Dr. Zachery ConchFriedman Cards on call paged to make aware that [facility] A[facility] Kilker [employer]

## 2014-08-07 ENCOUNTER — Encounter (HOSPITAL_COMMUNITY): Payer: Self-pay | Admitting: Cardiovascular Disease

## 2014-08-07 ENCOUNTER — Encounter (HOSPITAL_COMMUNITY)
Admission: EM | Disposition: A | Discharge status: Home / Self Care | Payer: Medicaid Other | Source: Home / Self Care | Attending: Cardiology

## 2014-08-07 DIAGNOSIS — I495 Sick sinus syndrome: Secondary | ICD-10-CM

## 2014-08-07 DIAGNOSIS — I10 Essential (primary) hypertension: Secondary | ICD-10-CM

## 2014-08-07 DIAGNOSIS — E785 Hyperlipidemia, unspecified: Secondary | ICD-10-CM

## 2014-08-07 HISTORY — PX: CARDIAC CATHETERIZATION: SHX172

## 2014-08-07 LAB — CBC
HCT: 42.1 % (ref 39.0–52.0)
HEMOGLOBIN: 14.1 g/dL (ref 13.0–17.0)
MCH: 30.3 pg (ref 26.0–34.0)
MCHC: 33.5 g/dL (ref 30.0–36.0)
MCV: 90.3 fL (ref 78.0–100.0)
Platelets: 226 10*3/uL (ref 150–400)
RBC: 4.66 MIL/uL (ref 4.22–5.81)
RDW: 13.8 % (ref 11.5–15.5)
WBC: 7.2 10*3/uL (ref 4.0–10.5)

## 2014-08-07 LAB — TROPONIN I
Troponin I: <0.03 ng/mL (ref <0.031)
Troponin I: <0.03 ng/mL (ref <0.031)

## 2014-08-07 LAB — LIPID PANEL
CHOL/HDL RATIO: 6.6 ratio
Cholesterol: 159 mg/dL (ref 0–200)
HDL: 24 mg/dL — ABNORMAL LOW (ref >40)
LDL Cholesterol: 114 mg/dL — ABNORMAL HIGH (ref 0–99)
TRIGLYCERIDES: 103 mg/dL (ref <150)
VLDL: 21 mg/dL (ref 0–40)

## 2014-08-07 LAB — BASIC METABOLIC PANEL
Anion gap: 4 — ABNORMAL LOW (ref 5–15)
BUN: 14 mg/dL (ref 6–20)
CO2: 25 mmol/L (ref 22–32)
CREATININE: 0.96 mg/dL (ref 0.61–1.24)
Calcium: 8.7 mg/dL — ABNORMAL LOW (ref 8.9–10.3)
Chloride: 110 mmol/L (ref 101–111)
Glucose, Bld: 113 mg/dL — ABNORMAL HIGH (ref 65–99)
Potassium: 4.4 mmol/L (ref 3.5–5.1)
SODIUM: 139 mmol/L (ref 135–145)

## 2014-08-07 LAB — PROTIME-INR
INR: 1.16 (ref 0.00–1.49)
Prothrombin Time: 15 seconds (ref 11.6–15.2)

## 2014-08-07 LAB — RAPID URINE DRUG SCREEN, HOSP PERFORMED
Amphetamines: NOT DETECTED
BARBITURATES: NOT DETECTED
BENZODIAZEPINES: NOT DETECTED
Cocaine: NOT DETECTED
Opiates: NOT DETECTED
Tetrahydrocannabinol: POSITIVE — AB

## 2014-08-07 LAB — HEPARIN LEVEL (UNFRACTIONATED): Heparin Unfractionated: 0.15 IU/mL — ABNORMAL LOW (ref 0.30–0.70)

## 2014-08-07 SURGERY — LEFT HEART CATH AND CORONARY ANGIOGRAPHY
Anesthesia: LOCAL

## 2014-08-07 MED ORDER — IOHEXOL 350 MG/ML SOLN
INTRAVENOUS | Status: DC | PRN
  Administered 2014-08-07: 65 mL via INTRA_ARTERIAL

## 2014-08-07 MED ORDER — LISINOPRIL 5 MG PO TABS: 10.0000 mg | ORAL_TABLET | Freq: Every day | ORAL | Status: DC

## 2014-08-07 MED ORDER — HEPARIN (PORCINE) IN NACL 2-0.9 UNIT/ML-% IJ SOLN
INTRAMUSCULAR | Status: AC
  Filled 2014-08-07: qty 1000

## 2014-08-07 MED ORDER — SODIUM CHLORIDE 0.9 % WEIGHT BASED INFUSION: 3.0000 mL/kg/h | INTRAVENOUS | Status: AC

## 2014-08-07 MED ORDER — HEPARIN SODIUM (PORCINE) 1000 UNIT/ML IJ SOLN
INTRAMUSCULAR | Status: DC | PRN
  Administered 2014-08-07: 4000 [IU] via INTRAVENOUS

## 2014-08-07 MED ORDER — LIDOCAINE HCL (PF) 1 % IJ SOLN
INTRAMUSCULAR | Status: AC
  Filled 2014-08-07: qty 30

## 2014-08-07 MED ORDER — LISINOPRIL 5 MG PO TABS: ORAL_TABLET | ORAL | Status: DC

## 2014-08-07 MED ORDER — HEPARIN SODIUM (PORCINE) 1000 UNIT/ML IJ SOLN
INTRAMUSCULAR | Status: AC
  Filled 2014-08-07: qty 1

## 2014-08-07 MED ORDER — MIDAZOLAM HCL 2 MG/2ML IJ SOLN
INTRAMUSCULAR | Status: DC | PRN
  Administered 2014-08-07: 2 mg via INTRAVENOUS

## 2014-08-07 MED ORDER — ASPIRIN 81 MG PO CHEW
81.0000 mg | CHEWABLE_TABLET | ORAL | Status: AC
  Administered 2014-08-07: 81 mg via ORAL
  Filled 2014-08-07: qty 1

## 2014-08-07 MED ORDER — SODIUM CHLORIDE 0.9 % IJ SOLN: 3.0000 mL | Freq: Two times a day (BID) | INTRAMUSCULAR | Status: DC

## 2014-08-07 MED ORDER — SODIUM CHLORIDE 0.9 % IV SOLN
INTRAVENOUS | Status: DC
  Administered 2014-08-07: 100 mL/h via INTRAVENOUS

## 2014-08-07 MED ORDER — VERAPAMIL HCL 2.5 MG/ML IV SOLN
INTRAVENOUS | Status: AC
  Filled 2014-08-07: qty 2

## 2014-08-07 MED ORDER — NITROGLYCERIN 1 MG/10 ML FOR IR/CATH LAB
INTRA_ARTERIAL | Status: AC
  Filled 2014-08-07: qty 10

## 2014-08-07 MED ORDER — SODIUM CHLORIDE 0.9 % IV SOLN: 250.0000 mL | INTRAVENOUS | Status: DC | PRN

## 2014-08-07 MED ORDER — SODIUM CHLORIDE 0.9 % IJ SOLN
INTRAMUSCULAR | Status: DC | PRN
  Administered 2014-08-07: 14:00:00 via INTRA_ARTERIAL

## 2014-08-07 MED ORDER — HEPARIN BOLUS VIA INFUSION
3000.0000 [IU] | Freq: Once | INTRAVENOUS | Status: AC
  Administered 2014-08-07: 3000 [IU] via INTRAVENOUS
  Filled 2014-08-07: qty 3000

## 2014-08-07 MED ORDER — SODIUM CHLORIDE 0.9 % IJ SOLN: 3.0000 mL | INTRAMUSCULAR | Status: DC | PRN

## 2014-08-07 MED ORDER — SODIUM CHLORIDE 0.9 % WEIGHT BASED INFUSION
1.0000 mL/kg/h | INTRAVENOUS | Status: DC
  Administered 2014-08-07: 1 mL/kg/h via INTRAVENOUS

## 2014-08-07 MED ORDER — MIDAZOLAM HCL 2 MG/2ML IJ SOLN
INTRAMUSCULAR | Status: AC
  Filled 2014-08-07: qty 2

## 2014-08-07 MED ORDER — FENTANYL CITRATE (PF) 100 MCG/2ML IJ SOLN
INTRAMUSCULAR | Status: AC
  Filled 2014-08-07: qty 2

## 2014-08-07 MED ORDER — LIDOCAINE HCL (PF) 1 % IJ SOLN
INTRAMUSCULAR | Status: DC | PRN
  Administered 2014-08-07: 5 mL via INTRADERMAL

## 2014-08-07 MED ORDER — OMEPRAZOLE 20 MG PO CPDR: 40.0000 mg | DELAYED_RELEASE_CAPSULE | Freq: Every day | ORAL | Status: DC

## 2014-08-07 MED ORDER — FENTANYL CITRATE (PF) 100 MCG/2ML IJ SOLN
INTRAMUSCULAR | Status: DC | PRN
  Administered 2014-08-07: 25 ug via INTRAVENOUS

## 2014-08-07 SURGICAL SUPPLY — 12 items
CATH INFINITI 5 FR JL3.5 (CATHETERS) IMPLANT
CATH INFINITI 5FR MULTPACK ANG (CATHETERS) ×2 IMPLANT
CATH INFINITI JR4 5F (CATHETERS) IMPLANT
DEVICE RAD COMP TR BAND LRG (VASCULAR PRODUCTS) ×2 IMPLANT
GLIDESHEATH SLEND SS 6F .021 (SHEATH) ×2 IMPLANT
KIT HEART LEFT (KITS) ×2 IMPLANT
PACK CARDIAC CATHETERIZATION (CUSTOM PROCEDURE TRAY) ×2 IMPLANT
SHEATH PINNACLE 5F 10CM (SHEATH) IMPLANT
SYR MEDRAD MARK V 150ML (SYRINGE) ×2 IMPLANT
TRANSDUCER W/STOPCOCK (MISCELLANEOUS) ×2 IMPLANT
TUBING CIL FLEX 10 FLL-RA (TUBING) ×2 IMPLANT
WIRE SAFE-T 1.5MM-J .035X260CM (WIRE) ×2 IMPLANT

## 2014-08-07 NOTE — Progress Notes (Signed)
Patient: Don CarnesSteven D Newport / Admit Date: 08/06/2014 / Date of Encounter: 08/07/2014, 8:18 AM   Subjective: Pain free overnight. Chest pain, sweating, vomiting was just like symptoms he had with STEMI in November. Troponins negative.  EKG nonspecific changes in lead III.  Objective: Telemetry: sinus tach last night; NSR since that time Physical Exam: Blood pressure 130/80, pulse 72, temperature 97.5 F (36.4 C), temperature source Oral, resp. rate 12, height 5' 6.5" (1.689 m), weight 184 lb 3.2 oz (83.553 kg), SpO2 96 %. General: Well developed, well nourished WM, in no acute distress. Head: Normocephalic, atraumatic, sclera non-icteric, no xanthomas, nares are without discharge. Neck: Negative for carotid bruits. JVP not elevated. Lungs: Diminished BS bilaterally to auscultation without wheezes, rales, or rhonchi. Breathing is unlabored. Heart: RRR S1 S2 without murmurs, rubs, or gallops.  Abdomen: Soft, non-tender, non-distended with normoactive bowel sounds. No rebound/guarding. Extremities: No clubbing or cyanosis. No edema. Distal pedal pulses are 2+ and equal bilaterally. Neuro: Alert and oriented X 3. Moves all extremities spontaneously. Psych:  Responds to questions appropriately with a normal affect.   Intake/Output Summary (Last 24 hours) at 08/07/14 0818 Last data filed at 08/07/14 0811  Gross per 24 hour  Intake 386.45 ml  Output    500 ml  Net -113.55 ml    Inpatient Medications:  . aspirin  81 mg Oral Daily  . atenolol  25 mg Oral Daily  . atorvastatin  80 mg Oral q1800  . budesonide-formoterol  2 puff Inhalation BID  . famotidine  20 mg Oral BID  . lisinopril  10 mg Oral Daily  . prasugrel  10 mg Oral Daily  . tiotropium  18 mcg Inhalation Daily   Infusions:  . heparin 1,250 Units/hr (08/07/14 0410)  . nitroGLYCERIN 15 mcg/min (08/06/14 2043)    Labs:  Recent Labs  08/06/14 1817 08/07/14 0507  NA 136 139  K 3.7 4.4  CL 104 110  CO2 22 25  GLUCOSE 155*  113*  BUN 16 14  CREATININE 1.09 0.96  CALCIUM 9.2 8.7*    Recent Labs  08/06/14 1817  AST 25  ALT 38  ALKPHOS 117  BILITOT 0.5  PROT 7.9  ALBUMIN 4.0    Recent Labs  08/06/14 1817 08/07/14 0501  WBC 10.6* 7.2  NEUTROABS 7.0  --   HGB 16.3 14.1  HCT 46.8 42.1  MCV 90.0 90.3  PLT 266 226    Recent Labs  08/06/14 1817 08/06/14 2323 08/07/14 0507  TROPONINI <0.03 <0.03 <0.03   Radiology/Studies:  Dg Chest 2 View  08/06/2014   CLINICAL DATA:  Pt c/o pain in center of chest with diaphoresis since this AM. Hx HTN, CAD, smoker, MI with cardiac stent placement in November 2015  EXAM: CHEST  2 VIEW  COMPARISON:  None.  FINDINGS: Cardiac silhouette normal in size and configuration. Normal mediastinal and hilar contours.  Minor scarring at the apices. No lung consolidation or edema. No pleural effusion or pneumothorax.  Bony thorax is intact.  IMPRESSION: No active cardiopulmonary disease.   Electronically Signed   By: Amie Portlandavid  Ormond M.D.   On: 08/06/2014 18:53     Assessment and Plan   1. Chest pain concerning for unstable angina with history of CAD s/p inferolateral STEMI 12/2013 s/p BMS to RPL - per fellow note, plan cath. Dr. SwazilandJordan also reviewed chart and has posted patient for procedure. Risks and benefits of cardiac catheterization have been discussed with the patient.  These include bleeding, infection,  kidney damage, stroke, heart attack, death.  The patient understands these risks and is willing to proceed. Contiue ASA, BB, statin, Effient, heparin. 2. Essential HTN - currently controlled.  3. Hyperlipidemia, uncontrolled - continue Lipitor. Consider addition of Zetia.  4. Ongoing tobacco abuse (longstanding - including snuff since age 36), THC - counseled regarding cessation.  5. Bipolar 1/schizophrenia - currently with normal affect. 6. Sinus tachycardia, transient on admission, normalized - question due to anxiety on admission. D-dimer negative. Resolved. Check UDS to  exclude other illicits. Check TSH.  Signed, Ronie Spies PA-C Pager: [phone]   Patient seen and examined and history reviewed. Agree with above findings and plan. Patient with known CAD s/p BMS of PL branch in November with acute MI. Yesterday developed profuse diaphoresis, N/V x 1 and chest pain similar to MI pain. Now pain free. Enzymes are negative. Ecg is normal. He reports compliance with meds. Symptoms concerning for USAP. Plan repeat cardiac cath today. Of note radial access attempted during acute event before but wire could not be passed.   Peter Swazil[place], MDFACC 08/07/2014 10:38 AM

## 2014-08-07 NOTE — Discharge Summary (Signed)
Discharge Summary   Patient ID: Don Fletcher MRN: [id number], DOB/AGE: May 24, 1970 44 y.o. Admit date: 08/06/2014 D/C date:     08/07/2014  Primary Care Provider: Ernestine ConradBLUTH, KIRK, MD Primary Cardiologist: Dr. Wyline [place]anch in [place]  Primary Discharge Diagnoses:  1. Chest pain with moderate risk of cardiac etiology 2. CAD s/p inferolateral STEMI 12/2013 s/p BMS to RPL - cath this admission showing occluded PLA stent of the right coronary, collateralized from the left coronary artery with diffuse nonobstructive CAD 2. Essential HTN 3. Hyperlipidemia 4. 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  5. Bipolar 1/schizophrenia 6. Sinus tachycardia, transient on admission, normalized, question due to anxiety on admission, d-dimer negative 7. Ischemic cardiomyopathy EF 45% by cath this admission  Secondary Discharge Diagnoses:  1. Chronic back pain 2. H/o peptic ulcer disease  Hospital Course: [name] is a 44 y/o M with history of bipolar 1, schizophrenia, HTN, COPD, CAD s/p interolateral STEMI (12/16/13) s/p BMS to RPL (EF 50-55% by echo at that time with +WMA) who presented to [facility] last night with CP.  Prior nuclear stress testing 02/2014 showed region of mixed scar and small area of mild to moderate peri-infarct ischemia noted in the inferior wall, cannot exclude a minor region of mid anterior ischemia as well, normal left ventricular wall motion, EF 60% - this was felt reflective of his prior MI.   Yesterday morning around 7:30 AM he went to his living room to sit on a chair. He abruptly broke out into a sweat despite his AC being on, and developed blurry vision, nausea, vomiting, and chest pain, "like an elephant was on my chest." Pain was 9/10. Pain lasted throughout the day and at 4:30pm, [name] drove himself to the ER due to to feeling progressively worse. These symptoms were similar to his STEMI pain except less severe. On  arrival to the ER, he had persistent CP. Labs notable for HR 112, 139/96, 100% on RA. Labs were notable for K 3.7, Cr 1.09, TnI <0.03, d-dimer <0.27. Initial WBC was 10.6 but he was afebrile. CMET showed elevated glucose at 155 but CBC was normal. CXR demonstrated no active cardiopulmonary disease. ECG demonstrated ST at 115bpm. Early R wave progression. RVH vs. old posterior MI. He was given 324mg  PO ASA, IV fluid, and was started on heparin and NTG drips. With uptitration of IV NTG, the patient became CP free on 115mcg/min. [facility]. He arrived CP free. He is a current 1PPD smoker and has tried to quit in past. Additionally smokes MJ. No other ilicits. No alcohol. He is unable to read or write. His medications are put in a pill box for him and he states that, except for rarely, he never misses doses. He remains on DAPT with ASA and prasugrel. He reported 2 changes to his med rec since the last OV with Dr. Wyline MoodBranch - he reported he is now taking 10mg  daily of lisinopril and 40mg  daily of omeprazole.  He remained chest pain free overnight. He requested to d/c nitro due to headache. Due to concern for symptoms of [place], cardiac cath was recommended. Troponins remained negative. (Of note radial access was attempted during prior acute event before but wire could not be passed.) Cardiac cath showed interim occlusion of the PLA stent of the right coronary, collateralized from the left coronary artery with diffuse nonobstructive CAD. He had mild segmental LV dysfunction with  inferior akinesis and LVEF 45%. Continued medical therapy was recommended. No med changes were made. F/u WBC was normal today. UDS +THC only. Lipid profile notable for HDL 24, LDL 114 on Lipitor . Dr. Swaziland elected to defer further adjustment of this to the patient's primary cardiologist as it's not clear exactly how compliant he was with the Lipitor. Can consider addition of Zetia as outpatient. He was advised to  f/u PCP for elevated blood sugar. He continues on PPI. Dr. Excell Seltzer has seen and examined the patient today and feels he is stable for discharge.    Discharge Vitals: Blood pressure 122/86, pulse 73, temperature 97.5 F (36.4 C), temperature source Oral, resp. rate 12, height 5' 6.5" (1.689 m), weight 184 lb 3.2 oz (83.553 kg), SpO2 98 %.  Labs: Lab Results  Component Value Date   WBC 7.2 08/07/2014   HGB 14.1 08/07/2014   HCT 42.1 08/07/2014   MCV 90.3 08/07/2014   PLT 226 08/07/2014    Recent Labs Lab 08/06/14 1817 08/07/14 0507  NA 136 139  K 3.7 4.4  CL 104 110  CO2 22 25  BUN 16 14  CREATININE 1.09 0.96  CALCIUM 9.2 8.7*  PROT 7.9  --   BILITOT 0.5  --   ALKPHOS 117  --   ALT 38  --   AST 25  --   GLUCOSE 155* 113*    Recent Labs  08/06/14 1817 08/06/14 2323 08/07/14 0507  TROPONINI <0.03 <0.03 <0.03   Lab Results  Component Value Date   CHOL 159 08/07/2014   HDL 24* 08/07/2014   LDLCALC 114* 08/07/2014   TRIG 103 08/07/2014   Lab Results  Component Value Date   DDIMER <0.27 08/06/2014    Diagnostic Studies/Procedures   Dg Chest 2 View  08/06/2014   CLINICAL DATA:  Pt c/o pain in center of chest with diaphoresis since this AM. Hx HTN, CAD, smoker, MI with cardiac stent placement in November 2015  EXAM: CHEST  2 VIEW  COMPARISON:  None.  FINDINGS: Cardiac silhouette normal in size and configuration. Normal mediastinal and hilar contours.  Minor scarring at the apices. No lung consolidation or edema. No pleural effusion or pneumothorax.  Bony thorax is intact.  IMPRESSION: No active cardiopulmonary disease.   Electronically Signed   By: Amie Portland M.D.   On: 08/06/2014 18:53   Cardiac catheterization this admission, please see full report and above for summary.   Discharge Medications   Current Discharge Medication List    CONTINUE these medications which have CHANGED   Details  lisinopril (PRINIVIL,ZESTRIL) 5 MG tablet Take  by mouth daily.  - med is not changed. This is what patient indicated he was taking prior to admission.    omeprazole (PRILOSEC) 20 MG capsule Take 2 capsules (40 mg total) by mouth daily. . - med is not changed. This is what patient indicated he was taking prior to admission.      CONTINUE these medications which have NOT CHANGED   Details  albuterol (PROVENTIL HFA;VENTOLIN HFA) 108 (90 BASE) MCG/ACT inhaler Inhale 2 puffs into the lungs every 6 (six) hours as needed for wheezing or shortness of breath.    aspirin 81 MG chewable tablet Chew 1 tablet (81 mg total) by mouth daily.    atenolol (TENORMIN) 25 MG tablet Take 1 tablet (25 mg total) by mouth daily.     atorvastatin (LIPITOR) 80 MG tablet Take 1 tablet (80 mg total) by mouth daily at  6 PM.     budesonide-formoterol (SYMBICORT) 160-4.5 MCG/ACT inhaler Inhale 2 puffs into the lungs 2 (two) times daily.    diazepam (VALIUM) 10 MG tablet Take 10 mg by mouth 2 (two) times daily as needed for anxiety.    nitroGLYCERIN (NITROSTAT) 0.4 MG SL tablet Place 1 tablet (0.4 mg total) under the tongue every 5 (five) minutes as needed for chest pain.     prasugrel (EFFIENT) 10 MG TABS tablet Take 1 tablet (10 mg total) by mouth daily.     ranitidine (ZANTAC) 150 MG tablet Take 150 mg by mouth daily.    tiotropium (SPIRIVA) 18 MCG inhalation capsule Place 18 mcg into inhaler and inhale daily.        Disposition   The patient will be discharged in stable condition to home. Discharge Instructions    Diet - low sodium heart healthy    Complete by:  As directed      Increase activity slowly    Complete by:  As directed   No driving for 2 days. No lifting over 5 lbs for 1 week. No sexual activity for 1 week. Keep procedure site clean & dry. If you notice increased pain, swelling, bleeding or pus, call/return!  You may shower, but no soaking baths/hot tubs/pools for 1 week.  BRING YOUR MEDICINES AND YOUR PILLBOX TO YOUR FOLLOW-UP APPOINTMENT.            Follow-up Information    Follow up with Dina Rich, MD.   Specialty:  Cardiology   Why:  09/10/14 at [facility] information:   [address] [place] [address] [phone]       Follow up with Ernestine Conrad, MD.   Specialty:  Family Medicine   Why:  Your blood sugar was elevated this admission. Please follow up with primary doctor for monitoring.    Contact information:   [address] [place] [address] [phone]         Duration of Discharge Encounter: Greater than 30 minutes including physician and PA time.  Maeola Harman, Lacora Folmer PA-C 08/07/2014, 3:03 PM

## 2014-08-07 NOTE — Progress Notes (Signed)
ANTICOAGULATION CONSULT NOTE - Follow Up Consult  Pharmacy Consult for heparin Indication: chest pain/ACS   Labs:  Recent Labs  08/06/14 1817 08/06/14 2323 08/07/14 0230  HGB 16.3  --   --   HCT 46.8  --   --   PLT 266  --   --   LABPROT 13.7  --   --   INR 1.03  --   --   HEPARINUNFRC  --   --  0.15*  CREATININE 1.09  --   --   TROPONINI <0.03 <0.03  --      Assessment: 44yo male subtherapeutic on heparin with initial dosing for CP.  Goal of Therapy:  Heparin level 0.3-0.7 units/ml   Plan:  Will rebolus with heparin 3000 units and increase gtt by 3 units/kg/hr to 1250 units/hr and check level in 6hr.  Vernard GamblesVeronda Louvinia Cumbo, PharmD, BCPS  08/07/2014,3:47 AM

## 2014-08-07 NOTE — Interval H&P Note (Signed)
History and Physical Interval Note:  08/07/2014 1:47 PM  Tawni CarnesSteven D Ching  has presented today for surgery, with the diagnosis of cp  The various methods of treatment have been discussed with the patient and family. After consideration of risks, benefits and other options for treatment, the patient has consented to  Procedure(s): Left Heart Cath and Coronary Angiography (N/A) as a surgical intervention .  The patient's history has been reviewed, patient examined, no change in status, stable for surgery.  I have reviewed the patient's chart and labs.  Questions were answered to the patient's satisfaction.    Cath Lab Visit (complete for each Cath Lab visit)  Clinical Evaluation Leading to the Procedure:   ACS: Yes.    Non-ACS:    Anginal Classification: CCS IV  Anti-ischemic medical therapy: Minimal Therapy (1 class of medications)  Non-Invasive Test Results: No non-invasive testing performed  Prior CABG: No previous CABG       Tonny Bollmanooper, Adrick Kestler

## 2014-08-07 NOTE — Progress Notes (Signed)
UR Completed Retina Bernardy Graves-Bigelow, RN,BSN [phone]  

## 2014-08-07 NOTE — Care Management Note (Signed)
Case Management Note  Patient Details  Name: Don CarnesSteven D Fletcher MRN: [id number] Date of Birth: 1970-03-11  Subjective/Objective: Pt admitted for cp- transfer from [facility]. Initiated on IV heparin and nitro gtt. Plan for cardiac cath today.                    Action/Plan: No needs identified by CM at this time.    Expected Discharge Date:  08/07/14               Expected Discharge Plan:  Home/Self Care  In-House Referral:     Discharge planning Services  CM Consult  Post Acute Care Choice:    Choice offered to:     DME Arranged:    DME Agency:     HH Arranged:    HH Agency:     Status of Service:  Completed, signed off  Medicare Important Message Given:    Date Medicare IM Given:    Medicare IM give by:    Date Additional Medicare IM Given:    Additional Medicare Important Message give by:     If discussed at Long Length of Stay Meetings, dates discussed:    Additional Comments:  Gala LewandowskyGraves-Bigelow, Karisma Meiser Kaye, RN 08/07/2014, 12:01 PM

## 2014-08-07 NOTE — H&P (View-Only) (Signed)
Patient: Don CarnesSteven D Newport / Admit Date: 08/06/2014 / Date of Encounter: 08/07/2014, 8:18 AM   Subjective: Pain free overnight. Chest pain, sweating, vomiting was just like symptoms he had with STEMI in November. Troponins negative.  EKG nonspecific changes in lead III.  Objective: Telemetry: sinus tach last night; NSR since that time Physical Exam: Blood pressure 130/80, pulse 72, temperature 97.5 F (36.4 C), temperature source Oral, resp. rate 12, height 5' 6.5" (1.689 m), weight 184 lb 3.2 oz (83.553 kg), SpO2 96 %. General: Well developed, well nourished WM, in no acute distress. Head: Normocephalic, atraumatic, sclera non-icteric, no xanthomas, nares are without discharge. Neck: Negative for carotid bruits. JVP not elevated. Lungs: Diminished BS bilaterally to auscultation without wheezes, rales, or rhonchi. Breathing is unlabored. Heart: RRR S1 S2 without murmurs, rubs, or gallops.  Abdomen: Soft, non-tender, non-distended with normoactive bowel sounds. No rebound/guarding. Extremities: No clubbing or cyanosis. No edema. Distal pedal pulses are 2+ and equal bilaterally. Neuro: Alert and oriented X 3. Moves all extremities spontaneously. Psych:  Responds to questions appropriately with a normal affect.   Intake/Output Summary (Last 24 hours) at 08/07/14 0818 Last data filed at 08/07/14 0811  Gross per 24 hour  Intake 386.45 ml  Output    500 ml  Net -113.55 ml    Inpatient Medications:  . aspirin  81 mg Oral Daily  . atenolol  25 mg Oral Daily  . atorvastatin  80 mg Oral q1800  . budesonide-formoterol  2 puff Inhalation BID  . famotidine  20 mg Oral BID  . lisinopril  10 mg Oral Daily  . prasugrel  10 mg Oral Daily  . tiotropium  18 mcg Inhalation Daily   Infusions:  . heparin 1,250 Units/hr (08/07/14 0410)  . nitroGLYCERIN 15 mcg/min (08/06/14 2043)    Labs:  Recent Labs  08/06/14 1817 08/07/14 0507  NA 136 139  K 3.7 4.4  CL 104 110  CO2 22 25  GLUCOSE 155*  113*  BUN 16 14  CREATININE 1.09 0.96  CALCIUM 9.2 8.7*    Recent Labs  08/06/14 1817  AST 25  ALT 38  ALKPHOS 117  BILITOT 0.5  PROT 7.9  ALBUMIN 4.0    Recent Labs  08/06/14 1817 08/07/14 0501  WBC 10.6* 7.2  NEUTROABS 7.0  --   HGB 16.3 14.1  HCT 46.8 42.1  MCV 90.0 90.3  PLT 266 226    Recent Labs  08/06/14 1817 08/06/14 2323 08/07/14 0507  TROPONINI <0.03 <0.03 <0.03   Radiology/Studies:  Dg Chest 2 View  08/06/2014   CLINICAL DATA:  Pt c/o pain in center of chest with diaphoresis since this AM. Hx HTN, CAD, smoker, MI with cardiac stent placement in November 2015  EXAM: CHEST  2 VIEW  COMPARISON:  None.  FINDINGS: Cardiac silhouette normal in size and configuration. Normal mediastinal and hilar contours.  Minor scarring at the apices. No lung consolidation or edema. No pleural effusion or pneumothorax.  Bony thorax is intact.  IMPRESSION: No active cardiopulmonary disease.   Electronically Signed   By: Amie Portlandavid  Ormond M.D.   On: 08/06/2014 18:53     Assessment and Plan   1. Chest pain concerning for unstable angina with history of CAD s/p inferolateral STEMI 12/2013 s/p BMS to RPL - per fellow note, plan cath. Dr. SwazilandJordan also reviewed chart and has posted patient for procedure. Risks and benefits of cardiac catheterization have been discussed with the patient.  These include bleeding, infection,  kidney damage, stroke, heart attack, death.  The patient understands these risks and is willing to proceed. Contiue ASA, BB, statin, Effient, heparin. 2. Essential HTN - currently controlled.  3. Hyperlipidemia, uncontrolled - continue Lipitor. Consider addition of Zetia.  4. Ongoing tobacco abuse (longstanding - including snuff since age 6), THC - counseled regarding cessation.  5. Bipolar 1/schizophrenia - currently with normal affect. 6. Sinus tachycardia, transient on admission, normalized - question due to anxiety on admission. D-dimer negative. Resolved. Check UDS to  exclude other illicits. Check TSH.  Signed, Dayna Dunn PA-C Pager: [phone]   Patient seen and examined and history reviewed. Agree with above findings and plan. Patient with known CAD s/p BMS of PL branch in November with acute MI. Yesterday developed profuse diaphoresis, N/V x 1 and chest pain similar to MI pain. Now pain free. Enzymes are negative. Ecg is normal. He reports compliance with meds. Symptoms concerning for USAP. Plan repeat cardiac cath today. Of note radial access attempted during acute event before but wire could not be passed.   Eulalie Speights, MDFACC 08/07/2014 10:38 AM    

## 2014-08-07 NOTE — Progress Notes (Signed)
ANTICOAGULATION CONSULT NOTE - Follow Up Consult  Pharmacy Consult for heparin Indication: chest pain/ACS   Labs:  Recent Labs  08/06/14 1817 08/06/14 2323 08/07/14 0230 08/07/14 0501 08/07/14 0507  HGB 16.3  --   --  14.1  --   HCT 46.8  --   --  42.1  --   PLT 266  --   --  226  --   LABPROT 13.7  --   --   --  15.0  INR 1.03  --   --   --  1.16  HEPARINUNFRC  --   --  0.15*  --   --   CREATININE 1.09  --   --   --  0.96  TROPONINI <0.03 <0.03  --   --  <0.03     Assessment: 44yo male subtherapeutic on heparin with initial dosing for CP.  HL ordered for 10am, patient now refusing lab draws according to nursing. Will continue heparin at current rate as unlikely to be supratherapeutic given previous rate and change this am. Follow up after cath.  Goal of Therapy:  Heparin level 0.3-0.7 units/ml   Plan:  Continue heparin at current rate Follow up after cath  Sheppard CoilFrank Wilson PharmD., BCPS Clinical Pharmacist Pager [phone] 08/07/2014 12:36 PM

## 2014-08-08 LAB — HEMOGLOBIN A1C
Hgb A1c MFr Bld: 5.8 % — ABNORMAL HIGH (ref 4.8–5.6)
MEAN PLASMA GLUCOSE: 120 mg/dL

## 2014-08-12 MED FILL — Heparin Sodium (Porcine) 2 Unit/ML in Sodium Chloride 0.9%: INTRAMUSCULAR | Qty: 1000 | Status: AC

## 2014-09-10 ENCOUNTER — Encounter: Payer: Self-pay | Admitting: Cardiology

## 2014-09-10 ENCOUNTER — Ambulatory Visit (INDEPENDENT_AMBULATORY_CARE_PROVIDER_SITE_OTHER): Payer: Medicaid Other | Admitting: Cardiology

## 2014-09-10 VITALS — BP 137/87 | HR 78 | Ht 68.0 in | Wt 188.0 lb

## 2014-09-10 DIAGNOSIS — I2510 Atherosclerotic heart disease of native coronary artery without angina pectoris: Secondary | ICD-10-CM | POA: Diagnosis not present

## 2014-09-10 NOTE — Progress Notes (Signed)
Patient ID: Don Fletcher, male   DOB: 11-13-1970, 44 y.o.   MRN: [id number]     Clinical Summary Don Fletcher is a 44 y.o.male seen today for follow up of the following medical problems.   1. CAD - pt admitted 12/16/13 with inferolateral STEMI, received BMS to proximal RPL. Full cath report below. LVEF 55% by LVgram, LVEF 50-55% by echo.  - on DAPT with ASA and effient, initially on brillinta but had some SOB and was changed while in the hospital  - Lexiscan MPI after cath in setting of chest pain Jan 2016 with inferior scar with mild to moderate ischemia, LVEF 60%. Overall low risk. Managed medically  - admit 07/2014 with chest pain, cath showed occluded in the PLA with left to right collateralization, managed medically. - denies any recent chest pain.   2. COPD - followed by Don Fletcher.    Past Medical History  Diagnosis Date  . Bipolar 1 disorder   . Schizophrenic disorder   . Hypertension   . Chronic back pain   . Peptic ulcer disease   . CAD (coronary artery disease)     a. 12/16/13 inferolat STEMI s/p BMS to RPL. b. Cath 08/2014: interim occlusion of the PLA stent of the RCA, collateralized from the left coronary artery with diffuse nonobstructive CAD.  [name] HLD (hyperlipidemia)   . Tobacco abuse   . Ischemic cardiomyopathy     a. 12/2013: EF 50-55% by echo with +WMA. b. EF 45% by cath in 08/2014.     Allergies  Allergen Reactions  . Benadryl [Diphenhydramine] Other (See Comments)    LEG CRAMPING  . Seroquel [Quetiapine] Other (See Comments)    "makes my legs cramp"  . Tylenol Pm Extra [Diphenhydramine-Apap (Sleep)] Other (See Comments)    "makes my leg cramp"     Current Outpatient Prescriptions  Medication Sig Dispense Refill  . albuterol (PROVENTIL HFA;VENTOLIN HFA) 108 (90 BASE) MCG/ACT inhaler Inhale 2 puffs into the lungs every 6 (six) hours as needed for wheezing or shortness of breath.    [name] aspirin 81 MG chewable tablet Chew 1 tablet (81 mg total) by mouth  daily.    [name] atenolol (TENORMIN) 25 MG tablet Take 1 tablet (25 mg total) by mouth daily. 180 tablet 3  . atorvastatin (LIPITOR) 80 MG tablet Take 1 tablet (80 mg total) by mouth daily at 6 PM. 30 tablet 11  . budesonide-formoterol (SYMBICORT) 160-4.5 MCG/ACT inhaler Inhale 2 puffs into the lungs 2 (two) times daily.    . diazepam (VALIUM) 10 MG tablet Take 10 mg by mouth 2 (two) times daily as needed for anxiety.    [name] lisinopril (PRINIVIL,ZESTRIL) 5 MG tablet Take 10mg  by mouth daily.    . nitroGLYCERIN (NITROSTAT) 0.4 MG SL tablet Place 1 tablet (0.4 mg total) under the tongue every 5 (five) minutes as needed for chest pain. 25 tablet 1  . omeprazole (PRILOSEC) 20 MG capsule Take 2 capsules (40 mg total) by mouth daily.    . prasugrel (EFFIENT) 10 MG TABS tablet Take 1 tablet (10 mg total) by mouth daily. 30 tablet 11  . ranitidine (ZANTAC) 150 MG tablet Take 150 mg by mouth daily.    [name] tiotropium (SPIRIVA) 18 MCG inhalation capsule Place 18 mcg into inhaler and inhale daily.     No current facility-administered medications for this visit.     Past Surgical History  Procedure Laterality Date  . Hernia repair    . Left heart catheterization  with coronary angiogram N/A 12/16/2013    Procedure: LEFT HEART CATHETERIZATION WITH CORONARY ANGIOGRAM;  Surgeon: Don Fletcher;  Location: [facility] CATH LAB;  Service: Cardiovascular;  Laterality: N/A;  . Percutaneous coronary stent intervention (pci-s)  12/16/2013    Procedure: PERCUTANEOUS CORONARY STENT INTERVENTION (PCI-S);  Surgeon: Don Fletcher;  Location: [facility] CATH LAB;  Service: Cardiovascular;;  distal rca  . Cardiac catheterization N/A 08/07/2014    Procedure: Left Heart Cath and Coronary Angiography;  Surgeon: Don Fletcher;  Location: [facility] INVASIVE CV LAB;  Service: Cardiovascular;  Laterality: N/A;     Allergies  Allergen Reactions  . Benadryl [Diphenhydramine] Other (See Comments)    LEG CRAMPING  . Seroquel  [Quetiapine] Other (See Comments)    "makes my legs cramp"  . Tylenol Pm Extra [Diphenhydramine-Apap (Sleep)] Other (See Comments)    "makes my leg cramp"      No family history on file.   Social History Don Fletcher reports that he has been smoking Cigarettes.  He started smoking about 26 years ago. He has a 12.5 pack-year smoking history. He quit smokeless tobacco use about 26 years ago. His smokeless tobacco use included Snuff. Don Fletcher reports that he does not drink alcohol.   Review of Systems CONSTITUTIONAL: No weight loss, fever, chills, weakness or fatigue.  HEENT: Eyes: No visual loss, blurred vision, double vision or yellow sclerae.No hearing loss, sneezing, congestion, runny nose or sore throat.  SKIN: No rash or itching.  CARDIOVASCULAR: per HPI  RESPIRATORY: No shortness of breath, cough or sputum.  GASTROINTESTINAL: No anorexia, nausea, vomiting or diarrhea. No abdominal pain or blood.  GENITOURINARY: No burning on urination, no polyuria NEUROLOGICAL: No headache, dizziness, syncope, paralysis, ataxia, numbness or tingling in the extremities. No change in bowel or bladder control.  MUSCULOSKELETAL: No muscle, back pain, joint pain or stiffness.  LYMPHATICS: No enlarged nodes. No history of splenectomy.  PSYCHIATRIC: No history of depression or anxiety.  ENDOCRINOLOGIC: No reports of sweating, cold or heat intolerance. No polyuria or polydipsia.  [name]   Physical Examination Filed Vitals:   09/10/14 1323  BP: 137/87  Pulse: 78   Filed Vitals:   09/10/14 1323  Height: 5\' 8"  (1.727 m)  Weight: 188 lb (85.276 kg)    Gen: resting comfortably, no acute distress HEENT: no scleral icterus, pupils equal round and reactive, no palptable cervical adenopathy,  CV: RRR, no m/r/g, no jvd Resp: Clear to auscultation bilaterally GI: abdomen is soft, non-tender, non-distended, normal bowel sounds, no hepatosplenomegaly MSK: extremities are warm, no edema.  Skin: warm, no  rash Neuro:  no focal deficits Psych: appropriate affect   Diagnostic Studies 12/2013 Echo Study Conclusions  - Left ventricle: The cavity size was normal. Wall thickness was normal. Systolic function was normal. The estimated ejection fraction was in the range of 50% to 55%. There is akinesis of the basal-midinferior myocardium. There is akinesis of the basalinferolateral myocardium. Left ventricular diastolic function parameters were normal. - Aortic valve: Mildly calcified annulus. Trileaflet. - Mitral valve: Mildly thickened leaflets . There was trivial regurgitation. - Right atrium: Central venous pressure (est): 3 mm Hg. - Atrial septum: No defect or patent foramen ovale was identified. - Tricuspid valve: There was trivial regurgitation. - Pulmonary arteries: Systolic pressure could not be accurately estimated. - Pericardium, extracardiac: There was no pericardial effusion.  Impressions:  - Normal LV wall thickness and chamber size with LVEF 50-55%, wall motion abnormalities as outlined above consistent with ischemic heart  disease, normal diastolic function. Mildly thickened mitral leaflets with trivial mitral regurgitation. Trivial tricuspid regurgitation, unable to assess PASP.  12/16/13 Cath HEMODYNAMICS: Aortic pressure was 91/60; LV pressure was 91/9; LVEDP 17. There was no gradient between the left ventricle and aorta.  ANGIOGRAPHIC DATA: The left main coronary artery has mild disease. The left anterior descending artery is a large vessel proximally. In the mid vessel, there is mild, diffuse disease. There are several medium-size diagonals which are widely patent. The left circumflex artery is large vessel. There is mild disease in the proximal portion. The OM1 is a large vessel. There is mild to moderate disease in the mid vessel, up to 40%. The right coronary artery is a large, dominant vessel. In the proximal portion, there is a  moderate stenosis of up to 50%. The posterior descending artery is a large vessel and reaches the apex and is widely patent. The posterior lateral artery is occluded proximally, just past the ostium. After opening the vessel of the balloon, it was noted that the disease extended into the mid posterior lateral artery. The posterior lateral artery was small caliber. LEFT VENTRICULOGRAM: Left ventricular angiogram was done in the 30 RAO projection and revealed mild, basal inferior hypokinesis with normal systolic function with an estimated ejection fraction of 55%. LVEDP was 17 mmHg. PCI NARRATIVE: A JR4 guiding catheter used to engage the RCA. Angiomax was used for anticoagulation. ACT was used to check that the Angiomax was therapeutic. A pro-water wire was placed across the area disease in the posterior lateral artery. An aspiration catheter was advanced to the ostium of the posterior lateral artery. Aspiration thrombectomy was performed with some improvement in flow. A 2.0 x 12 balloon was then used to predilate patient. The vessel was fairly small in caliber. Since the patient admits to prior bleeding issues as well as frequently forgetting to take his medications, we opted for a bare-metal stent. A 2.0 x 23 vision bare metal stent was deployed. The proximalstent was postdilated with a 2.25 by 12 noncompliant balloon. There was an excellent angiographic result of the proximal stent and the bifurcation with the posterior descending artery. There was a stepdown at the distal edge of the stent. TIMI-3 flow was restored. The patient's pain was significantly improved. IMPRESSIONS:  Widely patent left main coronary artery.  Mild disease in the left anterior descending artery and its branches.  Mild disease in the left circumflex artery and its branches.  Moderate disease in the proximal right coronary artery.occluded posterior lateral artery which was the culprit for the patient's presentation. This was  successfully treated with a 2.0 x 23 vision bare metal stent, postdilated to 2.25 mm in diameter.  Normal left ventricular systolic function. LVEDP 17 mmHg. Ejection fraction 55%. RECOMMENDATION: Continue dual antiplatelet therapy for at least 30 days and likely longer. A bare-metal stent was chosen because the patient admits to forgetting to take his medication fairly frequently. He also stated that his wife does not remember to take medicines as well. Continue aggressive secondary prevention. He'll be watched in the ICU. Continue Angiomax at the reduced rate until the bag runs out. He needs to stop smoking. Significant family h/o CAD. Start beta blocker. Start statin. Nicotine patch. Consider f/u in [facility] if that is easier for him to be compliant.   Jan 2016 Lexiscan MPI IMPRESSION: 1. Region of a mixed scar and small area of mild to moderate peri-infarct ischemia noted in the inferior wall as outlined. Cannot exclude a minor  region of mid anterior ischemia as well.  2. Normal left ventricular wall motion.  3. Left ventricular ejection fraction 60%  4. Overall low-risk stress test findings*.  08/2014 cath FINAL CONCLUSIONS:  SINGLE VESSEL CAD WITH OCCLUSION OF THE PLA STENT IN THE RIGHT CORONARY, COLLATERALIZED FROM THE LEFT CORONARY ARTERY  DIFFUSE NONOBSTRUCTIVE CAD  MILD SEGMENTAL LV DYSFUNCTION WITH INFERIOR AKINESIS AND ESTIMATED LVEF 45%  RECOMMENDATIONS:  MEDICAL THERAPY  OK FOR DISCHARGE HOME TODAY  Assessment and Plan  1. CAD -repeat cath show occluded RPL stent, vessel filled by collaterals and managed medically. No recent chest pain - continue current meds   F/u 4 months      Antoine Poche, M.D

## 2014-09-10 NOTE — Patient Instructions (Signed)
Continue all current medications. Your physician wants you to follow up in:  4 months.  You will receive a reminder letter in the mail one-two months in advance.  If you don't receive a letter, please call our office to schedule the follow up appointment   

## 2014-09-17 ENCOUNTER — Ambulatory Visit: Payer: Medicaid Other | Admitting: Cardiology

## 2014-10-16 ENCOUNTER — Telehealth: Payer: Self-pay | Admitting: Cardiology

## 2014-10-16 MED ORDER — PRASUGREL HCL 10 MG PO TABS: 10.0000 mg | ORAL_TABLET | Freq: Every day | ORAL | Status: DC

## 2014-10-16 NOTE — Telephone Encounter (Signed)
Spoke with patient and he said that he lost his effient. Nurse advised patient that samples of effient would be left up front for pick up.

## 2014-10-16 NOTE — Telephone Encounter (Signed)
Patient lost medication he just got filled.  Pharmacy told him it would be over $400 to get another bottle. He can not afford to do this and was wanting to know if we had samples to give him for the month

## 2014-11-05 DIAGNOSIS — Z8711 Personal history of peptic ulcer disease: Secondary | ICD-10-CM | POA: Insufficient documentation

## 2014-11-05 DIAGNOSIS — F319 Bipolar disorder, unspecified: Secondary | ICD-10-CM | POA: Insufficient documentation

## 2014-11-05 DIAGNOSIS — Z720 Tobacco use: Secondary | ICD-10-CM | POA: Insufficient documentation

## 2014-11-05 DIAGNOSIS — Z9889 Other specified postprocedural states: Secondary | ICD-10-CM | POA: Insufficient documentation

## 2014-11-05 DIAGNOSIS — R1030 Lower abdominal pain, unspecified: Secondary | ICD-10-CM | POA: Insufficient documentation

## 2014-11-05 DIAGNOSIS — R339 Retention of urine, unspecified: Secondary | ICD-10-CM | POA: Diagnosis present

## 2014-11-05 DIAGNOSIS — M549 Dorsalgia, unspecified: Secondary | ICD-10-CM | POA: Diagnosis not present

## 2014-11-05 DIAGNOSIS — E785 Hyperlipidemia, unspecified: Secondary | ICD-10-CM | POA: Diagnosis not present

## 2014-11-05 DIAGNOSIS — Z79899 Other long term (current) drug therapy: Secondary | ICD-10-CM | POA: Diagnosis not present

## 2014-11-05 DIAGNOSIS — I10 Essential (primary) hypertension: Secondary | ICD-10-CM | POA: Diagnosis not present

## 2014-11-05 DIAGNOSIS — Z7982 Long term (current) use of aspirin: Secondary | ICD-10-CM | POA: Insufficient documentation

## 2014-11-05 DIAGNOSIS — R1110 Vomiting, unspecified: Secondary | ICD-10-CM | POA: Diagnosis not present

## 2014-11-05 DIAGNOSIS — Z7951 Long term (current) use of inhaled steroids: Secondary | ICD-10-CM | POA: Insufficient documentation

## 2014-11-05 DIAGNOSIS — Z955 Presence of coronary angioplasty implant and graft: Secondary | ICD-10-CM | POA: Insufficient documentation

## 2014-11-05 DIAGNOSIS — I2510 Atherosclerotic heart disease of native coronary artery without angina pectoris: Secondary | ICD-10-CM | POA: Diagnosis not present

## 2014-11-05 DIAGNOSIS — R3919 Other difficulties with micturition: Secondary | ICD-10-CM | POA: Insufficient documentation

## 2014-11-05 DIAGNOSIS — R140 Abdominal distension (gaseous): Secondary | ICD-10-CM | POA: Insufficient documentation

## 2014-11-05 DIAGNOSIS — G8929 Other chronic pain: Secondary | ICD-10-CM | POA: Insufficient documentation

## 2014-11-06 ENCOUNTER — Encounter (HOSPITAL_COMMUNITY): Payer: Self-pay

## 2014-11-06 ENCOUNTER — Emergency Department (HOSPITAL_COMMUNITY)
Admission: EM | Admit: 2014-11-06 | Discharge: 2014-11-06 | Disposition: A | Discharge status: Home / Self Care | Payer: Medicaid Other | Attending: Emergency Medicine | Admitting: Emergency Medicine

## 2014-11-06 DIAGNOSIS — R339 Retention of urine, unspecified: Secondary | ICD-10-CM

## 2014-11-06 LAB — URINALYSIS, ROUTINE W REFLEX MICROSCOPIC
BILIRUBIN URINE: NEGATIVE
GLUCOSE, UA: NEGATIVE mg/dL
HGB URINE DIPSTICK: NEGATIVE
Ketones, ur: NEGATIVE mg/dL
Leukocytes, UA: NEGATIVE
Nitrite: NEGATIVE
PROTEIN: NEGATIVE mg/dL
Specific Gravity, Urine: 1.01 (ref 1.005–1.030)
UROBILINOGEN UA: 0.2 mg/dL (ref 0.0–1.0)
pH: 7 (ref 5.0–8.0)

## 2014-11-06 LAB — BASIC METABOLIC PANEL
Anion gap: 8 (ref 5–15)
BUN: 13 mg/dL (ref 6–20)
CO2: 26 mmol/L (ref 22–32)
CREATININE: 1.14 mg/dL (ref 0.61–1.24)
Calcium: 8.5 mg/dL — ABNORMAL LOW (ref 8.9–10.3)
Chloride: 102 mmol/L (ref 101–111)
GFR calc Af Amer: >60 mL/min (ref >60)
Glucose, Bld: 93 mg/dL (ref 65–99)
Potassium: 3.8 mmol/L (ref 3.5–5.1)
Sodium: 136 mmol/L (ref 135–145)

## 2014-11-06 NOTE — Discharge Instructions (Signed)
Your difficulty urinating is most likely from your suboxone. Contact your pain management doctor to discuss this medication, you will probably have to have it changed. Call [facility] to be rechecked next week.  Return to the ED if the bag stops draining urine.

## 2014-11-06 NOTE — ED Notes (Signed)
Bladder scan performed, results showed 87ml in bladder, pt reports that he had urinated prior to arrival in er but is was only a small amount,

## 2014-11-06 NOTE — ED Notes (Signed)
Pt states he has been having difficulty urinating for 3 days, states he has to "push the urine out"   Pt states he saw Dr Loney Hering yesterday and had some tests done but does not know the results.

## 2014-11-06 NOTE — ED Notes (Signed)
Foley changed to leg bag,

## 2014-11-06 NOTE — ED Notes (Signed)
Dr.Knapp at bedside  

## 2014-11-06 NOTE — ED Provider Notes (Signed)
CSN: [id number]     Arrival date & time 11/05/14  2356 History  By signing my name below, I, Tanda Rockers, attest that this documentation has been prepared under the direction and in the presence of Devoria Albe, MD. Started at 12:50 AM Electronically Signed: Tanda Rockers, ED Scribe. 11/06/2014. 12:59 AM.  Chief Complaint  Patient presents with  . Urinary Retention   The history is provided by the patient. No language interpreter was used.     HPI Comments: Don Fletcher is a 44 y.o. male who presents to the Emergency Department complaining of difficulty urinating x 3-4 days. He states he has to strain to urinate and notes only a small amount of urine coming out. When pt strains it causes lower abdominal pain. Pt also complains of abdominal bloating and vomiting 2 days ago with 2 episodes. Pt was seen by PCP, Dr. Loney Hering, yesterday and had blood work done but has not gotten the results yet. Denies hematuria, fever, chills, or any other associated symptoms. Pt has never had symptoms like this in the past. No recent surgery. Pt recently started taking Suboxone 2 weeks ago for his chronic back pain. States they changed his dose recently.  Pt is current every day smoker who smokes 0.5 ppd.   PCP Dr Loney Hering Pain Management Dr Wyline Beady   Past Medical History  Diagnosis Date  . Bipolar 1 disorder   . Schizophrenic disorder   . Hypertension   . Chronic back pain   . Peptic ulcer disease   . CAD (coronary artery disease)     a. 12/16/13 inferolat STEMI s/p BMS to RPL. b. Cath 08/2014: interim occlusion of the PLA stent of the RCA, collateralized from the left coronary artery with diffuse nonobstructive CAD.  [name] HLD (hyperlipidemia)   . Tobacco abuse   . Ischemic cardiomyopathy     a. 12/2013: EF 50-55% by echo with +WMA. b. EF 45% by cath in 08/2014.   Past Surgical History  Procedure Laterality Date  . Hernia repair    . Left heart catheterization with coronary angiogram N/A 12/16/2013     Procedure: LEFT HEART CATHETERIZATION WITH CORONARY ANGIOGRAM;  Surgeon: Corky Crafts, MD;  Location: [facility] CATH LAB;  Service: Cardiovascular;  Laterality: N/A;  . Percutaneous coronary stent intervention (pci-s)  12/16/2013    Procedure: PERCUTANEOUS CORONARY STENT INTERVENTION (PCI-S);  Surgeon: Corky Crafts, MD;  Location: [facility] CATH LAB;  Service: Cardiovascular;;  distal rca  . Cardiac catheterization N/A 08/07/2014    Procedure: Left Heart Cath and Coronary Angiography;  Surgeon: Tonny Bollman, MD;  Location: [facility] INVASIVE CV LAB;  Service: Cardiovascular;  Laterality: N/A;   No family history on file. Social History  Substance Use Topics  . Smoking status: Current Every Day Smoker -- 0.50 packs/day for 25 years    Types: Cigarettes    Start date: 05/25/1988  . Smokeless tobacco: Former Neurosurgeon    Types: Snuff    Quit date: 05/21/1988  . Alcohol Use: No  on disability because he can't read or write  Review of Systems  Constitutional: Negative for fever.  Gastrointestinal: Positive for vomiting, abdominal pain and abdominal distention.  Genitourinary: Positive for decreased urine volume and difficulty urinating. Negative for hematuria.  All other systems reviewed and are negative.     Allergies  Benadryl; Seroquel; and Tylenol pm extra  Home Medications   Prior to Admission medications   Medication Sig Start Date End Date Taking? Authorizing Provider  amitriptyline (ELAVIL)  25 MG tablet Take 25 mg by mouth at bedtime.   Yes Historical Provider, MD  buprenorphine-naloxone (SUBOXONE) 2-0.5 MG SUBL SL tablet Place 1 tablet under the tongue daily.   Yes Historical Provider, MD  DULoxetine (CYMBALTA) 60 MG capsule Take 60 mg by mouth daily.   Yes Historical Provider, MD  albuterol (PROVENTIL HFA;VENTOLIN HFA) 108 (90 BASE) MCG/ACT inhaler Inhale 2 puffs into the lungs every 6 (six) hours as needed for wheezing or shortness of breath.    Historical Provider, MD  aspirin 81 MG  chewable tablet Chew 1 tablet (81 mg total) by mouth daily. 12/18/13   Janetta Hora, PA-C  atenolol (TENORMIN) 25 MG tablet Take 1 tablet (25 mg total) by mouth daily. 02/16/14   Antoine Poche, MD  atorvastatin (LIPITOR) 80 MG tablet Take 1 tablet (80 mg total) by mouth daily at 6 PM. 12/18/13   Janetta Hora, PA-C  budesonide-formoterol [employer]) 160-4.5 MCG/ACT inhaler Inhale 2 puffs into the lungs 2 (two) times daily.    Historical Provider, MD  diazepam (VALIUM) 10 MG tablet Take 10 mg by mouth every 8 (eight) hours as needed for anxiety.     Historical Provider, MD  lisinopril (PRINIVIL,ZESTRIL) 5 MG tablet Take  by mouth daily. Patient taking differently: 10 mg daily. Take  by mouth daily. 08/07/14   Dayna N Dunn, PA-C  nitroGLYCERIN (NITROSTAT) 0.4 MG SL tablet Place 1 tablet (0.4 mg total) under the tongue every 5 (five) minutes as needed for chest pain. 02/16/14   Antoine Poche, MD  omeprazole (PRILOSEC) 20 MG capsule Take 2 capsules (40 mg total) by mouth daily. 08/07/14   Dayna N Dunn, PA-C  prasugrel (EFFIENT) 10 MG TABS tablet Take 1 tablet (10 mg total) by mouth daily. 10/16/14   Antoine Poche, MD  ranitidine (ZANTAC) 150 MG tablet Take 150 mg by mouth daily.    Historical Provider, MD  tiotropium (SPIRIVA) 18 MCG inhalation capsule Place 18 mcg into inhaler and inhale daily.    Historical Provider, MD   Triage Vitals: BP 161/92 mmHg  Pulse 92  Temp(Src) 97.7 F (36.5 C) (Oral)  Resp 16  Ht  (1.676 m)  Wt 180 lb (81.647 kg)  BMI 29.07 kg/m2  SpO2 100%   Vital signs normal    Physical Exam  Constitutional: He is oriented to person, place, and time. He appears well-developed and well-nourished.  Non-toxic appearance. He does not appear ill. No distress.  HENT:  Head: Normocephalic and atraumatic.  Right Ear: External ear normal.  Left Ear: External ear normal.  Nose: Nose normal. No mucosal edema or rhinorrhea.  Mouth/Throat: Oropharynx is  clear and moist and mucous membranes are normal. No dental abscesses or uvula swelling.  Eyes: Conjunctivae and EOM are normal. Pupils are equal, round, and reactive to light.  Neck: Normal range of motion and full passive range of motion without pain. Neck supple.  Cardiovascular: Normal rate, regular rhythm and normal heart sounds.  Exam reveals no gallop and no friction rub.   No murmur heard. Pulmonary/Chest: Effort normal and breath sounds normal. No respiratory distress. He has no wheezes. He has no rhonchi. He has no rales. He exhibits no tenderness and no crepitus.  Abdominal: Soft. Normal appearance and bowel sounds are normal. He exhibits distension. There is tenderness. There is no rebound and no guarding.  Tender diffusely in lower abdomen Mild distension  Musculoskeletal: Normal range of motion. He exhibits no edema or tenderness.  Moves all extremities well.   Neurological: He is alert and oriented to person, place, and time. He has normal strength. No cranial nerve deficit.  Skin: Skin is warm, dry and intact. No rash noted. No erythema. No pallor.  Psychiatric: He has a normal mood and affect. His speech is normal and behavior is normal. His mood appears not anxious.  Nursing note and vitals reviewed.   ED Course  Procedures (including critical care time)  DIAGNOSTIC STUDIES: Oxygen Saturation is 100% on RA, normal by my interpretation.    COORDINATION OF CARE: 12:58 AM-Discussed treatment plan which includes in and out cath with pt at bedside and pt agreed to plan.   Bladder scan was done and only showed 87 mL in the bladder. However patient's lower abdomen appeared to be distended. Foley catheter was inserted and he had 400 mL of urine. Reviewing Suboxone shows urinary retention is a common side effect of the medication. This is the most likely etiology for his urinary retention. He will be referred back to his pain management doctor and to a urologist.  Labs  Review Results for orders placed or performed during the hospital encounter of 11/06/14  Urinalysis, Routine w reflex microscopic (not at [facility])  Result Value Ref Range   Color, Urine YELLOW YELLOW   APPearance CLEAR CLEAR   Specific Gravity, Urine 1.010 1.005 - 1.030   pH 7.0 5.0 - 8.0   Glucose, UA NEGATIVE NEGATIVE mg/dL   Hgb urine dipstick NEGATIVE NEGATIVE   Bilirubin Urine NEGATIVE NEGATIVE   Ketones, ur NEGATIVE NEGATIVE mg/dL   Protein, ur NEGATIVE NEGATIVE mg/dL   Urobilinogen, UA 0.2 0.0 - 1.0 mg/dL   Nitrite NEGATIVE NEGATIVE   Leukocytes, UA NEGATIVE NEGATIVE  Basic metabolic panel  Result Value Ref Range   Sodium 136 135 - 145 mmol/L   Potassium 3.8 3.5 - 5.1 mmol/L   Chloride 102 101 - 111 mmol/L   CO2 26 22 - 32 mmol/L   Glucose, Bld 93 65 - 99 mg/dL   BUN 13 6 - 20 mg/dL   Creatinine, Ser 1.61 0.61 - 1.24 mg/dL   Calcium 8.5 (L) 8.9 - 10.3 mg/dL   GFR calc non Af Amer >60 >60 mL/min   GFR calc Af Amer >60 >60 mL/min   Anion gap 8 5 - 15   Laboratory interpretation all normal     Imaging Review No results found.   EKG Interpretation None      MDM   Final diagnoses:  Urinary retention    Plan discharged with foley catheter  Devoria Albe, MD, FACEP   I personally performed the services described in this documentation, which was scribed in my presence. The recorded information has been reviewed and considered.  Devoria Albe, MD, Concha Pyo, MD 11/06/14 [id number]

## 2014-11-08 ENCOUNTER — Encounter (HOSPITAL_COMMUNITY): Payer: Self-pay | Admitting: Cardiology

## 2014-11-08 ENCOUNTER — Emergency Department (HOSPITAL_COMMUNITY)
Admission: EM | Admit: 2014-11-08 | Discharge: 2014-11-08 | Disposition: A | Discharge status: Home / Self Care | Payer: Medicaid Other | Attending: Emergency Medicine | Admitting: Emergency Medicine

## 2014-11-08 ENCOUNTER — Emergency Department (HOSPITAL_COMMUNITY): Payer: Medicaid Other

## 2014-11-08 DIAGNOSIS — I10 Essential (primary) hypertension: Secondary | ICD-10-CM | POA: Insufficient documentation

## 2014-11-08 DIAGNOSIS — I2510 Atherosclerotic heart disease of native coronary artery without angina pectoris: Secondary | ICD-10-CM | POA: Insufficient documentation

## 2014-11-08 DIAGNOSIS — E785 Hyperlipidemia, unspecified: Secondary | ICD-10-CM | POA: Diagnosis not present

## 2014-11-08 DIAGNOSIS — Z7982 Long term (current) use of aspirin: Secondary | ICD-10-CM | POA: Insufficient documentation

## 2014-11-08 DIAGNOSIS — Z7902 Long term (current) use of antithrombotics/antiplatelets: Secondary | ICD-10-CM | POA: Insufficient documentation

## 2014-11-08 DIAGNOSIS — R05 Cough: Secondary | ICD-10-CM | POA: Insufficient documentation

## 2014-11-08 DIAGNOSIS — Z7951 Long term (current) use of inhaled steroids: Secondary | ICD-10-CM | POA: Insufficient documentation

## 2014-11-08 DIAGNOSIS — Z720 Tobacco use: Secondary | ICD-10-CM | POA: Diagnosis not present

## 2014-11-08 DIAGNOSIS — Z8711 Personal history of peptic ulcer disease: Secondary | ICD-10-CM | POA: Diagnosis not present

## 2014-11-08 DIAGNOSIS — R109 Unspecified abdominal pain: Secondary | ICD-10-CM

## 2014-11-08 DIAGNOSIS — R1012 Left upper quadrant pain: Secondary | ICD-10-CM | POA: Insufficient documentation

## 2014-11-08 DIAGNOSIS — R1084 Generalized abdominal pain: Secondary | ICD-10-CM | POA: Diagnosis present

## 2014-11-08 DIAGNOSIS — M545 Low back pain: Secondary | ICD-10-CM | POA: Insufficient documentation

## 2014-11-08 DIAGNOSIS — K5900 Constipation, unspecified: Secondary | ICD-10-CM | POA: Insufficient documentation

## 2014-11-08 DIAGNOSIS — G8929 Other chronic pain: Secondary | ICD-10-CM | POA: Diagnosis not present

## 2014-11-08 DIAGNOSIS — Z79899 Other long term (current) drug therapy: Secondary | ICD-10-CM | POA: Insufficient documentation

## 2014-11-08 DIAGNOSIS — R1031 Right lower quadrant pain: Secondary | ICD-10-CM | POA: Diagnosis not present

## 2014-11-08 DIAGNOSIS — Z9889 Other specified postprocedural states: Secondary | ICD-10-CM | POA: Diagnosis not present

## 2014-11-08 DIAGNOSIS — R52 Pain, unspecified: Secondary | ICD-10-CM

## 2014-11-08 DIAGNOSIS — F319 Bipolar disorder, unspecified: Secondary | ICD-10-CM | POA: Insufficient documentation

## 2014-11-08 LAB — COMPREHENSIVE METABOLIC PANEL
ALT: 38 U/L (ref 17–63)
ANION GAP: 7 (ref 5–15)
AST: 28 U/L (ref 15–41)
Albumin: 3.4 g/dL — ABNORMAL LOW (ref 3.5–5.0)
Alkaline Phosphatase: 122 U/L (ref 38–126)
BILIRUBIN TOTAL: 0.3 mg/dL (ref 0.3–1.2)
BUN: 9 mg/dL (ref 6–20)
CHLORIDE: 104 mmol/L (ref 101–111)
CO2: 26 mmol/L (ref 22–32)
Calcium: 8.3 mg/dL — ABNORMAL LOW (ref 8.9–10.3)
Creatinine, Ser: 1.06 mg/dL (ref 0.61–1.24)
GFR calc non Af Amer: >60 mL/min (ref >60)
Glucose, Bld: 113 mg/dL — ABNORMAL HIGH (ref 65–99)
POTASSIUM: 4 mmol/L (ref 3.5–5.1)
Sodium: 137 mmol/L (ref 135–145)
Total Protein: 7.1 g/dL (ref 6.5–8.1)

## 2014-11-08 LAB — URINALYSIS, ROUTINE W REFLEX MICROSCOPIC
Bilirubin Urine: NEGATIVE
GLUCOSE, UA: NEGATIVE mg/dL
Ketones, ur: NEGATIVE mg/dL
Leukocytes, UA: NEGATIVE
Nitrite: NEGATIVE
PH: 6 (ref 5.0–8.0)
Protein, ur: NEGATIVE mg/dL
Specific Gravity, Urine: 1.025 (ref 1.005–1.030)
Urobilinogen, UA: 1 mg/dL (ref 0.0–1.0)

## 2014-11-08 LAB — URINE MICROSCOPIC-ADD ON

## 2014-11-08 LAB — CBC WITH DIFFERENTIAL/PLATELET
BASOS ABS: 0.1 10*3/uL (ref 0.0–0.1)
Basophils Relative: 1 %
EOS PCT: 5 %
Eosinophils Absolute: 0.4 10*3/uL (ref 0.0–0.7)
HEMATOCRIT: 44.9 % (ref 39.0–52.0)
Hemoglobin: 15.8 g/dL (ref 13.0–17.0)
LYMPHS PCT: 37 %
Lymphs Abs: 3 10*3/uL (ref 0.7–4.0)
MCH: 32.8 pg (ref 26.0–34.0)
MCHC: 35.2 g/dL (ref 30.0–36.0)
MCV: 93.3 fL (ref 78.0–100.0)
MONO ABS: 0.6 10*3/uL (ref 0.1–1.0)
MONOS PCT: 8 %
NEUTROS ABS: 4 10*3/uL (ref 1.7–7.7)
Neutrophils Relative %: 49 %
PLATELETS: 208 10*3/uL (ref 150–400)
RBC: 4.81 MIL/uL (ref 4.22–5.81)
RDW: 13.4 % (ref 11.5–15.5)
WBC: 8 10*3/uL (ref 4.0–10.5)

## 2014-11-08 MED ORDER — SODIUM CHLORIDE 0.9 % IV BOLUS (SEPSIS)
1000.0000 mL | Freq: Once | INTRAVENOUS | Status: AC
  Administered 2014-11-08: 1000 mL via INTRAVENOUS

## 2014-11-08 MED ORDER — KETOROLAC TROMETHAMINE 30 MG/ML IJ SOLN
30.0000 mg | Freq: Once | INTRAMUSCULAR | Status: AC
  Administered 2014-11-08: 30 mg via INTRAVENOUS
  Filled 2014-11-08: qty 1

## 2014-11-08 NOTE — ED Notes (Addendum)
Pt reports he was in the ED 2 days ago and had foley catheter placed because he was on Subaxone and it caused urinary retention. Pt reports he hasn't had a BM in 3-4 days. Pt denies fever and chills. Pt started having bilateral sharp flank pain that radiates around to the lower abdomen last night.

## 2014-11-08 NOTE — ED Notes (Signed)
MD at bedside. 

## 2014-11-08 NOTE — ED Provider Notes (Signed)
CSN: [id number]     Arrival date & time 11/08/14  1042 History  By signing my name below, I, Murriel Hopper, attest that this documentation has been prepared under the direction and in the presence of Bethann Berkshire, MD. Electronically Signed: Murriel Hopper, ED Scribe. 11/08/2014. 11:13 AM.    Chief Complaint  Patient presents with  . Abdominal Pain     Patient is a 44 y.o. male presenting with abdominal pain. The history is provided by the patient. No language interpreter was used.  Abdominal Pain Pain location:  Generalized Pain severity:  Moderate Onset quality:  Gradual Duration:  4 days Timing:  Constant Progression:  Worsening Chronicity:  New Associated symptoms: constipation and cough   Associated symptoms: no chest pain, no chills, no diarrhea, no fatigue, no fever and no hematuria    HPI Comments: Don Fletcher is a 44 y.o. male who presents to the Emergency Department complaining of constant generalized abdominal pain with associated bilateral lower back pain and constipation that has been present for 3-4 days. Pt states he was seen here two days ago and had a foley catheter placed. Pt states his appetite is normal. Pt also reports having a mild cough over the past couple of days, but denies chills, wheezing, or fever.     Past Medical History  Diagnosis Date  . Bipolar 1 disorder (HCC)   . Schizophrenic disorder (HCC)   . Hypertension   . Chronic back pain   . Peptic ulcer disease   . CAD (coronary artery disease)     a. 12/16/13 inferolat STEMI s/p BMS to RPL. b. Cath 08/2014: interim occlusion of the PLA stent of the RCA, collateralized from the left coronary artery with diffuse nonobstructive CAD.  [name] HLD (hyperlipidemia)   . Tobacco abuse   . Ischemic cardiomyopathy     a. 12/2013: EF 50-55% by echo with +WMA. b. EF 45% by cath in 08/2014.   Past Surgical History  Procedure Laterality Date  . Hernia repair    . Left heart catheterization with coronary angiogram  N/A 12/16/2013    Procedure: LEFT HEART CATHETERIZATION WITH CORONARY ANGIOGRAM;  Surgeon: Corky Crafts, MD;  Location: [facility] CATH LAB;  Service: Cardiovascular;  Laterality: N/A;  . Percutaneous coronary stent intervention (pci-s)  12/16/2013    Procedure: PERCUTANEOUS CORONARY STENT INTERVENTION (PCI-S);  Surgeon: Corky Crafts, MD;  Location: [facility] CATH LAB;  Service: Cardiovascular;;  distal rca  . Cardiac catheterization N/A 08/07/2014    Procedure: Left Heart Cath and Coronary Angiography;  Surgeon: Tonny Bollman, MD;  Location: [facility] INVASIVE CV LAB;  Service: Cardiovascular;  Laterality: N/A;   History reviewed. No pertinent family history. Social History  Substance Use Topics  . Smoking status: Current Every Day Smoker -- 0.50 packs/day for 25 years    Types: Cigarettes    Start date: 05/25/1988  . Smokeless tobacco: Former Neurosurgeon    Types: Snuff    Quit date: 05/21/1988  . Alcohol Use: No    Review of Systems  Constitutional: Negative for fever, chills, appetite change and fatigue.  HENT: Negative for congestion, ear discharge and sinus pressure.   Eyes: Negative for discharge.  Respiratory: Positive for cough. Negative for wheezing.   Cardiovascular: Negative for chest pain.  Gastrointestinal: Positive for abdominal pain and constipation. Negative for diarrhea.  Genitourinary: Negative for frequency and hematuria.  Musculoskeletal: Positive for back pain.  Skin: Negative for rash.  Neurological: Negative for seizures and headaches.  Psychiatric/Behavioral: Negative  for hallucinations.      Allergies  Benadryl; Seroquel; and Tylenol pm extra  Home Medications   Prior to Admission medications   Medication Sig Start Date End Date Taking? Authorizing Provider  albuterol (PROVENTIL HFA;VENTOLIN HFA) 108 (90 BASE) MCG/ACT inhaler Inhale 2 puffs into the lungs every 6 (six) hours as needed for wheezing or shortness of breath.    Historical Provider, MD  amitriptyline  (ELAVIL) 25 MG tablet Take 25 mg by mouth at bedtime.    Historical Provider, MD  aspirin 81 MG chewable tablet Chew 1 tablet (81 mg total) by mouth daily. 12/18/13   Janetta Hora, PA-C  atenolol (TENORMIN) 25 MG tablet Take 1 tablet (25 mg total) by mouth daily. 02/16/14   Antoine Poche, MD  atorvastatin (LIPITOR) 80 MG tablet Take 1 tablet (80 mg total) by mouth daily at 6 PM. 12/18/13   Janetta Hora, PA-C  budesonide-formoterol [facility]) 160-4.5 MCG/ACT inhaler Inhale 2 puffs into the lungs 2 (two) times daily.    Historical Provider, MD  buprenorphine-naloxone (SUBOXONE) 2-0.5 MG SUBL SL tablet Place 1 tablet under the tongue daily.    Historical Provider, MD  diazepam (VALIUM) 10 MG tablet Take 10 mg by mouth every 8 (eight) hours as needed for anxiety.     Historical Provider, MD  DULoxetine (CYMBALTA) 60 MG capsule Take 60 mg by mouth daily.    Historical Provider, MD  lisinopril (PRINIVIL,ZESTRIL) 5 MG tablet Take  by mouth daily. Patient taking differently: 10 mg daily. Take  by mouth daily. 08/07/14   Dayna N Dunn, PA-C  nitroGLYCERIN (NITROSTAT) 0.4 MG SL tablet Place 1 tablet (0.4 mg total) under the tongue every 5 (five) minutes as needed for chest pain. 02/16/14   Antoine Poche, MD  omeprazole (PRILOSEC) 20 MG capsule Take 2 capsules (40 mg total) by mouth daily. 08/07/14   Dayna N Dunn, PA-C  prasugrel (EFFIENT) 10 MG TABS tablet Take 1 tablet (10 mg total) by mouth daily. 10/16/14   Antoine Poche, MD  ranitidine (ZANTAC) 150 MG tablet Take 150 mg by mouth daily.    Historical Provider, MD  tiotropium (SPIRIVA) 18 MCG inhalation capsule Place 18 mcg into inhaler and inhale daily.    Historical Provider, MD   BP 138/99 mmHg  Pulse 114  Temp(Src) 97.6 F (36.4 C) (Oral)  Resp 18  Ht  (1.676 m)  Wt 180 lb (81.647 kg)  BMI 29.07 kg/m2  SpO2 98% Physical Exam  Constitutional: He is oriented to person, place, and time. He appears well-developed.   HENT:  Head: Normocephalic.  Eyes: Conjunctivae and EOM are normal. No scleral icterus.  Neck: Neck supple. No thyromegaly present.  Cardiovascular: Normal rate and regular rhythm.  Exam reveals no gallop and no friction rub.   No murmur heard. Pulmonary/Chest: No stridor. He has no wheezes. He has no rales. He exhibits no tenderness.  Abdominal: He exhibits no distension. There is tenderness. There is no rebound.  Mild to moderate left and right lower quadrant tenderness  Musculoskeletal: Normal range of motion. He exhibits no edema.  Lymphadenopathy:    He has no cervical adenopathy.  Neurological: He is oriented to person, place, and time. He exhibits normal muscle tone. Coordination normal.  Skin: No rash noted. No erythema.  Psychiatric: He has a normal mood and affect. His behavior is normal.    ED Course  Procedures (including critical care time)  DIAGNOSTIC STUDIES: Oxygen Saturation is 98% on  room air, normal by my interpretation.    COORDINATION OF CARE: 11:08 AM Discussed treatment plan with pt at bedside and pt agreed to plan.   Labs Review Labs Reviewed  URINALYSIS, ROUTINE W REFLEX MICROSCOPIC (NOT AT [facility])    Imaging Review No results found. I have personally reviewed and evaluated these images and lab results as part of my medical decision-making.   EKG Interpretation None      MDM   Final diagnoses:  None     Abdominal pain labs unremarkable CT scan shows mild constipation. Will have patient take a laxative and follow-up with her family doctor this week.The chart was scribed for me under my direct supervision.  I personally performed the history, physical, and medical decision making and all procedures in the evaluation of this patient.Bethann Berkshire, MD 11/09/14 1255

## 2014-11-08 NOTE — Discharge Instructions (Signed)
Use a laxative like milk of magnesia and follow up with your md next week

## 2014-11-08 NOTE — ED Notes (Signed)
Seen here 2 days ago and had a foley catheter placed.  Abdominal pain since last night.  States he has not had a bm in 3-4days.

## 2014-11-18 ENCOUNTER — Ambulatory Visit (INDEPENDENT_AMBULATORY_CARE_PROVIDER_SITE_OTHER): Payer: Medicaid Other | Admitting: Urology

## 2014-11-18 DIAGNOSIS — R339 Retention of urine, unspecified: Secondary | ICD-10-CM

## 2014-11-18 DIAGNOSIS — R109 Unspecified abdominal pain: Secondary | ICD-10-CM

## 2014-12-23 ENCOUNTER — Ambulatory Visit (INDEPENDENT_AMBULATORY_CARE_PROVIDER_SITE_OTHER): Payer: Medicaid Other | Admitting: Urology

## 2014-12-23 DIAGNOSIS — R350 Frequency of micturition: Secondary | ICD-10-CM | POA: Diagnosis not present

## 2015-01-06 ENCOUNTER — Other Ambulatory Visit: Payer: Self-pay | Admitting: Physician Assistant

## 2015-01-14 ENCOUNTER — Encounter: Payer: Self-pay | Admitting: Cardiology

## 2015-01-14 ENCOUNTER — Ambulatory Visit (INDEPENDENT_AMBULATORY_CARE_PROVIDER_SITE_OTHER): Payer: Medicaid Other | Admitting: Cardiology

## 2015-01-14 VITALS — BP 122/76 | HR 77 | Ht 68.0 in | Wt 195.4 lb

## 2015-01-14 DIAGNOSIS — I2510 Atherosclerotic heart disease of native coronary artery without angina pectoris: Secondary | ICD-10-CM | POA: Diagnosis not present

## 2015-01-14 NOTE — Patient Instructions (Signed)
Your physician wants you to follow-up in: 6 MONTHS WITH DR. BRANCH You will receive a reminder letter in the mail two months in advance. If you don't receive a letter, please call our office to schedule the follow-up appointment.  Your physician has recommended you make the following change in your medication:   STOP EFFIENT  Thank you for choosing [facility]!!

## 2015-01-14 NOTE — Progress Notes (Signed)
Patient ID: JERMANE BRAYBOY, male   DOB: Jun 14, 1970, 44 y.o.   MRN: [id number]     Clinical Summary Mr. Ciavarella is a 44 y.o.male seen today for follow up of the following medical problems.   1. CAD - pt admitted 12/16/13 with inferolateral STEMI, received BMS to proximal RPL. Full cath report below. LVEF 55% by LVgram, LVEF 50-55% by echo.  - on DAPT with ASA and effient, initially on brillinta but had some SOB and was changed while in the hospital  - Lexiscan MPI after cath in setting of chest pain Jan 2016 with inferior scar with mild to moderate ischemia, LVEF 60%. Overall low risk. Managed medically - admit 07/2014 with chest pain, cath showed occluded in the PLA with left to right collateralization, managed medically. - denies any recent chest pain.   - denies any chest pain. Stable SOB  2. COPD - followed by Dr Juanetta Gosling.   3. Chronic back pain - followed in pain clinic     Past Medical History  Diagnosis Date  . Bipolar 1 disorder (HCC)   . Schizophrenic disorder (HCC)   . Hypertension   . Chronic back pain   . Peptic ulcer disease   . CAD (coronary artery disease)     a. 12/16/13 inferolat STEMI s/p BMS to RPL. b. Cath 08/2014: interim occlusion of the PLA stent of the RCA, collateralized from the left coronary artery with diffuse nonobstructive CAD.  [name] HLD (hyperlipidemia)   . Tobacco abuse   . Ischemic cardiomyopathy     a. 12/2013: EF 50-55% by echo with +WMA. b. EF 45% by cath in 08/2014.     Allergies  Allergen Reactions  . Benadryl [Diphenhydramine] Other (See Comments)    LEG CRAMPING  . Seroquel [Quetiapine] Other (See Comments)    "makes my legs cramp"  . Tylenol Pm Extra [Diphenhydramine-Apap (Sleep)] Other (See Comments)    "makes my leg cramp"     Current Outpatient Prescriptions  Medication Sig Dispense Refill  . albuterol (PROVENTIL HFA;VENTOLIN HFA) 108 (90 BASE) MCG/ACT inhaler Inhale 2 puffs into the lungs every 6 (six) hours as needed for  wheezing or shortness of breath.    [name] amitriptyline (ELAVIL) 25 MG tablet Take 25 mg by mouth at bedtime.    [name] aspirin 81 MG chewable tablet Chew 1 tablet (81 mg total) by mouth daily. (Patient not taking: Reported on 11/08/2014)    . atenolol (TENORMIN) 25 MG tablet Take 1 tablet (25 mg total) by mouth daily. 180 tablet 3  . atorvastatin (LIPITOR) 80 MG tablet TAKE 1 TABLET BY MOUTH EVERY DAY AT 6PM 30 tablet 11  . budesonide-formoterol (SYMBICORT) 160-4.5 MCG/ACT inhaler Inhale 2 puffs into the lungs 2 (two) times daily.    . diazepam (VALIUM) 10 MG tablet Take 10 mg by mouth every 8 (eight) hours as needed for anxiety.     . DULoxetine (CYMBALTA) 60 MG capsule Take 60 mg by mouth daily.    [name] EFFIENT 10 MG TABS tablet TAKE 1 TABLET BY MOUTH EVERY DAY 30 tablet 11  . lisinopril (PRINIVIL,ZESTRIL) 5 MG tablet Take  by mouth daily. (Patient taking differently: 10 mg daily. Take  by mouth daily.)    . nitroGLYCERIN (NITROSTAT) 0.4 MG SL tablet Place 1 tablet (0.4 mg total) under the tongue every 5 (five) minutes as needed for chest pain. 25 tablet 1  . omeprazole (PRILOSEC) 20 MG capsule Take 2 capsules (40 mg total) by mouth daily.    [name]  prasugrel (EFFIENT) 10 MG TABS tablet Take 1 tablet (10 mg total) by mouth daily. 30 tablet 0  . ranitidine (ZANTAC) 150 MG tablet Take 150 mg by mouth daily.    [name] tiotropium (SPIRIVA) 18 MCG inhalation capsule Place 18 mcg into inhaler and inhale daily.     No current facility-administered medications for this visit.     Past Surgical History  Procedure Laterality Date  . Hernia repair    . Left heart catheterization with coronary angiogram N/A 12/16/2013    Procedure: LEFT HEART CATHETERIZATION WITH CORONARY ANGIOGRAM;  Surgeon: Corky Crafts, MD;  Location: [facility] CATH LAB;  Service: Cardiovascular;  Laterality: N/A;  . Percutaneous coronary stent intervention (pci-s)  12/16/2013    Procedure: PERCUTANEOUS CORONARY STENT INTERVENTION (PCI-S);   Surgeon: Corky Crafts, MD;  Location: [facility] CATH LAB;  Service: Cardiovascular;;  distal rca  . Cardiac catheterization N/A 08/07/2014    Procedure: Left Heart Cath and Coronary Angiography;  Surgeon: Tonny Bollman, MD;  Location: [facility] INVASIVE CV LAB;  Service: Cardiovascular;  Laterality: N/A;     Allergies  Allergen Reactions  . Benadryl [Diphenhydramine] Other (See Comments)    LEG CRAMPING  . Seroquel [Quetiapine] Other (See Comments)    "makes my legs cramp"  . Tylenol Pm Extra [Diphenhydramine-Apap (Sleep)] Other (See Comments)    "makes my leg cramp"      No family history on file.   Social History Mr. Kann reports that he has been smoking Cigarettes.  He started smoking about 26 years ago. He has a 12.5 pack-year smoking history. He quit smokeless tobacco use about 26 years ago. His smokeless tobacco use included Snuff. Mr. Peggs reports that he does not drink alcohol.   Review of Systems CONSTITUTIONAL: No weight loss, fever, chills, weakness or fatigue.  HEENT: Eyes: No visual loss, blurred vision, double vision or yellow sclerae.No hearing loss, sneezing, congestion, runny nose or sore throat.  SKIN: No rash or itching.  CARDIOVASCULAR: per hpi RESPIRATORY: No shortness of breath, cough or sputum.  GASTROINTESTINAL: No anorexia, nausea, vomiting or diarrhea. No abdominal pain or blood.  GENITOURINARY: No burning on urination, no polyuria NEUROLOGICAL: No headache, dizziness, syncope, paralysis, ataxia, numbness or tingling in the extremities. No change in bowel or bladder control.  MUSCULOSKELETAL: No muscle, back pain, joint pain or stiffness.  LYMPHATICS: No enlarged nodes. No history of splenectomy.  PSYCHIATRIC: No history of depression or anxiety.  ENDOCRINOLOGIC: No reports of sweating, cold or heat intolerance. No polyuria or polydipsia.  [name]   Physical Examination Filed Vitals:   01/14/15 1454  BP: 122/76  Pulse: 77   Filed Vitals:   01/14/15 1454    Height:  (1.727 m)  Weight: 195 lb 6.4 oz (88.633 kg)    Gen: resting comfortably, no acute distress HEENT: no scleral icterus, pupils equal round and reactive, no palptable cervical adenopathy,  CV: RRR, no m/r/g, no jvd Resp: Clear to auscultation bilaterally GI: abdomen is soft, non-tender, non-distended, normal bowel sounds, no hepatosplenomegaly MSK: extremities are warm, no edema.  Skin: warm, no rash Neuro:  no focal deficits Psych: appropriate affect   Diagnostic Studies 12/2013 Echo Study Conclusions  - Left ventricle: The cavity size was normal. Wall thickness was normal. Systolic function was normal. The estimated ejection fraction was in the range of 50% to 55%. There is akinesis of the basal-midinferior myocardium. There is akinesis of the basalinferolateral myocardium. Left ventricular diastolic function parameters were normal. - Aortic valve: Mildly  calcified annulus. Trileaflet. - Mitral valve: Mildly thickened leaflets . There was trivial regurgitation. - Right atrium: Central venous pressure (est): 3 mm Hg. - Atrial septum: No defect or patent foramen ovale was identified. - Tricuspid valve: There was trivial regurgitation. - Pulmonary arteries: Systolic pressure could not be accurately estimated. - Pericardium, extracardiac: There was no pericardial effusion.  Impressions:  - Normal LV wall thickness and chamber size with LVEF 50-55%, wall motion abnormalities as outlined above consistent with ischemic heart disease, normal diastolic function. Mildly thickened mitral leaflets with trivial mitral regurgitation. Trivial tricuspid regurgitation, unable to assess PASP.  12/16/13 Cath HEMODYNAMICS: Aortic pressure was 91/60; LV pressure was 91/9; LVEDP 17. There was no gradient between the left ventricle and aorta.  ANGIOGRAPHIC DATA: The left main coronary artery has mild disease. The left anterior descending artery is a  large vessel proximally. In the mid vessel, there is mild, diffuse disease. There are several medium-size diagonals which are widely patent. The left circumflex artery is large vessel. There is mild disease in the proximal portion. The OM1 is a large vessel. There is mild to moderate disease in the mid vessel, up to 40%. The right coronary artery is a large, dominant vessel. In the proximal portion, there is a moderate stenosis of up to 50%. The posterior descending artery is a large vessel and reaches the apex and is widely patent. The posterior lateral artery is occluded proximally, just past the ostium. After opening the vessel of the balloon, it was noted that the disease extended into the mid posterior lateral artery. The posterior lateral artery was small caliber. LEFT VENTRICULOGRAM: Left ventricular angiogram was done in the 30 RAO projection and revealed mild, basal inferior hypokinesis with normal systolic function with an estimated ejection fraction of 55%. LVEDP was 17 mmHg. PCI NARRATIVE: A JR4 guiding catheter used to engage the RCA. Angiomax was used for anticoagulation. ACT was used to check that the Angiomax was therapeutic. A pro-water wire was placed across the area disease in the posterior lateral artery. An aspiration catheter was advanced to the ostium of the posterior lateral artery. Aspiration thrombectomy was performed with some improvement in flow. A 2.0 x 12 balloon was then used to predilate patient. The vessel was fairly small in caliber. Since the patient admits to prior bleeding issues as well as frequently forgetting to take his medications, we opted for a bare-metal stent. A 2.0 x 23 vision bare metal stent was deployed. The proximalstent was postdilated with a 2.25 by 12 noncompliant balloon. There was an excellent angiographic result of the proximal stent and the bifurcation with the posterior descending artery. There was a stepdown at the distal edge of the stent. TIMI-3  flow was restored. The patient's pain was significantly improved. IMPRESSIONS:  Widely patent left main coronary artery.  Mild disease in the left anterior descending artery and its branches.  Mild disease in the left circumflex artery and its branches.  Moderate disease in the proximal right coronary artery.occluded posterior lateral artery which was the culprit for the patient's presentation. This was successfully treated with a 2.0 x 23 vision bare metal stent, postdilated to 2.25 mm in diameter.  Normal left ventricular systolic function. LVEDP 17 mmHg. Ejection fraction 55%. RECOMMENDATION: Continue dual antiplatelet therapy for at least 30 days and likely longer. A bare-metal stent was chosen because the patient admits to forgetting to take his medication fairly frequently. He also stated that his wife does not remember to take medicines as well.  Continue aggressive secondary prevention. He'll be watched in the ICU. Continue Angiomax at the reduced rate until the bag runs out. He needs to stop smoking. Significant family h/o CAD. Start beta blocker. Start statin. Nicotine patch. Consider f/u in [facility] if that is easier for him to be compliant.   Jan 2016 Lexiscan MPI IMPRESSION: 1. Region of a mixed scar and small area of mild to moderate peri-infarct ischemia noted in the inferior wall as outlined. Cannot exclude a minor region of mid anterior ischemia as well.  2. Normal left ventricular wall motion.  3. Left ventricular ejection fraction 60%  4. Overall low-risk stress test findings*.  08/2014 cath FINAL CONCLUSIONS:  SINGLE VESSEL CAD WITH OCCLUSION OF THE PLA STENT IN THE RIGHT CORONARY, COLLATERALIZED FROM THE LEFT CORONARY ARTERY  DIFFUSE NONOBSTRUCTIVE CAD  MILD SEGMENTAL LV DYSFUNCTION WITH INFERIOR AKINESIS AND ESTIMATED LVEF 45%  RECOMMENDATIONS:  MEDICAL THERAPY  OK FOR DISCHARGE HOME TODAY    Assessment and Plan  1. CAD -no   Current symptoms - will d/c effient as he as completed a year        Antoine Poche, M.D.

## 2015-03-08 ENCOUNTER — Other Ambulatory Visit: Payer: Self-pay | Admitting: Cardiology

## 2016-05-16 ENCOUNTER — Telehealth: Payer: Self-pay | Admitting: Cardiology

## 2016-05-16 NOTE — Progress Notes (Signed)
Discussed patient with [facility] ER staff. 46 yo male history of prior STEMI with BMS to RPL. Repeat cath showed occlusion of this stent, with left collaterals managed medically. Presents with whats described as typical chest pain symptoms. EKG by verbal report nonacute, troponin 0.01 then 0.02. Given his history of CAD and description of symptoms I have recommended he be started on anticoagulation, and arranged for transfer to [facility] for additional medical management and perhaps cath pending evaluation.   Dina Rich MD

## 2016-05-19 ENCOUNTER — Ambulatory Visit (HOSPITAL_COMMUNITY): Admit: 2016-05-19 | Payer: Medicaid Other | Admitting: Cardiovascular Disease

## 2016-05-19 ENCOUNTER — Encounter (HOSPITAL_COMMUNITY): Payer: Self-pay | Admitting: Cardiology

## 2016-05-19 ENCOUNTER — Encounter (HOSPITAL_COMMUNITY)
Admission: RE | Discharge status: Against Medical Advice | Payer: Self-pay | Source: Home / Self Care | Attending: Cardiovascular Disease

## 2016-05-19 ENCOUNTER — Inpatient Hospital Stay (HOSPITAL_COMMUNITY): Payer: Medicaid Other

## 2016-05-19 ENCOUNTER — Inpatient Hospital Stay (HOSPITAL_COMMUNITY)
Admission: RE | Admit: 2016-05-19 | Discharge: 2016-05-21 | DRG: 247 | Discharge status: Against Medical Advice | Payer: Medicaid Other | Attending: Cardiovascular Disease | Admitting: Cardiovascular Disease

## 2016-05-19 DIAGNOSIS — I252 Old myocardial infarction: Secondary | ICD-10-CM | POA: Diagnosis not present

## 2016-05-19 DIAGNOSIS — T82855D Stenosis of coronary artery stent, subsequent encounter: Secondary | ICD-10-CM

## 2016-05-19 DIAGNOSIS — I2583 Coronary atherosclerosis due to lipid rich plaque: Secondary | ICD-10-CM | POA: Diagnosis present

## 2016-05-19 DIAGNOSIS — J449 Chronic obstructive pulmonary disease, unspecified: Secondary | ICD-10-CM | POA: Diagnosis present

## 2016-05-19 DIAGNOSIS — I255 Ischemic cardiomyopathy: Secondary | ICD-10-CM

## 2016-05-19 DIAGNOSIS — I213 ST elevation (STEMI) myocardial infarction of unspecified site: Secondary | ICD-10-CM | POA: Insufficient documentation

## 2016-05-19 DIAGNOSIS — F319 Bipolar disorder, unspecified: Secondary | ICD-10-CM | POA: Diagnosis present

## 2016-05-19 DIAGNOSIS — Z720 Tobacco use: Secondary | ICD-10-CM | POA: Diagnosis present

## 2016-05-19 DIAGNOSIS — Z886 Allergy status to analgesic agent status: Secondary | ICD-10-CM | POA: Diagnosis not present

## 2016-05-19 DIAGNOSIS — F17210 Nicotine dependence, cigarettes, uncomplicated: Secondary | ICD-10-CM | POA: Diagnosis present

## 2016-05-19 DIAGNOSIS — I2510 Atherosclerotic heart disease of native coronary artery without angina pectoris: Secondary | ICD-10-CM | POA: Diagnosis present

## 2016-05-19 DIAGNOSIS — I10 Essential (primary) hypertension: Secondary | ICD-10-CM | POA: Diagnosis present

## 2016-05-19 DIAGNOSIS — F209 Schizophrenia, unspecified: Secondary | ICD-10-CM | POA: Diagnosis present

## 2016-05-19 DIAGNOSIS — I25110 Atherosclerotic heart disease of native coronary artery with unstable angina pectoris: Secondary | ICD-10-CM | POA: Diagnosis not present

## 2016-05-19 DIAGNOSIS — Z8711 Personal history of peptic ulcer disease: Secondary | ICD-10-CM

## 2016-05-19 DIAGNOSIS — E785 Hyperlipidemia, unspecified: Secondary | ICD-10-CM | POA: Diagnosis present

## 2016-05-19 DIAGNOSIS — Z888 Allergy status to other drugs, medicaments and biological substances status: Secondary | ICD-10-CM | POA: Diagnosis not present

## 2016-05-19 DIAGNOSIS — E782 Mixed hyperlipidemia: Secondary | ICD-10-CM | POA: Diagnosis not present

## 2016-05-19 DIAGNOSIS — I2121 ST elevation (STEMI) myocardial infarction involving left circumflex coronary artery: Principal | ICD-10-CM | POA: Diagnosis present

## 2016-05-19 DIAGNOSIS — Z8249 Family history of ischemic heart disease and other diseases of the circulatory system: Secondary | ICD-10-CM | POA: Diagnosis not present

## 2016-05-19 HISTORY — PX: LEFT HEART CATH AND CORONARY ANGIOGRAPHY: CATH118249

## 2016-05-19 HISTORY — PX: CORONARY STENT INTERVENTION: CATH118234

## 2016-05-19 HISTORY — DX: Ischemic cardiomyopathy: I25.5

## 2016-05-19 LAB — TROPONIN I
Troponin I: <0.03 ng/mL (ref <0.03)
Troponin I: 2.63 ng/mL (ref <0.03)

## 2016-05-19 LAB — POCT I-STAT, CHEM 8
BUN: 6 mg/dL (ref 6–20)
CHLORIDE: 105 mmol/L (ref 101–111)
Calcium, Ion: 1.19 mmol/L (ref 1.15–1.40)
Creatinine, Ser: 0.8 mg/dL (ref 0.61–1.24)
GLUCOSE: 118 mg/dL — AB (ref 65–99)
HCT: 48 % (ref 39.0–52.0)
Hemoglobin: 16.3 g/dL (ref 13.0–17.0)
Potassium: 3.8 mmol/L (ref 3.5–5.1)
Sodium: 140 mmol/L (ref 135–145)
TCO2: 24 mmol/L (ref 0–100)

## 2016-05-19 LAB — CBC WITH DIFFERENTIAL/PLATELET
BASOS PCT: 0 %
Basophils Absolute: 0.1 10*3/uL (ref 0.0–0.1)
EOS ABS: 0.1 10*3/uL (ref 0.0–0.7)
EOS PCT: 1 %
HCT: 45.5 % (ref 39.0–52.0)
Hemoglobin: 16 g/dL (ref 13.0–17.0)
Lymphocytes Relative: 11 %
Lymphs Abs: 1.7 10*3/uL (ref 0.7–4.0)
MCH: 31.6 pg (ref 26.0–34.0)
MCHC: 35.2 g/dL (ref 30.0–36.0)
MCV: 89.9 fL (ref 78.0–100.0)
MONO ABS: 0.9 10*3/uL (ref 0.1–1.0)
MONOS PCT: 5 %
Neutro Abs: 13 10*3/uL — ABNORMAL HIGH (ref 1.7–7.7)
Neutrophils Relative %: 83 %
Platelets: 241 10*3/uL (ref 150–400)
RBC: 5.06 MIL/uL (ref 4.22–5.81)
RDW: 13.1 % (ref 11.5–15.5)
WBC: 15.8 10*3/uL — ABNORMAL HIGH (ref 4.0–10.5)

## 2016-05-19 LAB — CBC
HCT: 46.7 % (ref 39.0–52.0)
Hemoglobin: 16.4 g/dL (ref 13.0–17.0)
MCH: 31.5 pg (ref 26.0–34.0)
MCHC: 35.1 g/dL (ref 30.0–36.0)
MCV: 89.6 fL (ref 78.0–100.0)
PLATELETS: 250 10*3/uL (ref 150–400)
RBC: 5.21 MIL/uL (ref 4.22–5.81)
RDW: 13.2 % (ref 11.5–15.5)
WBC: 14 10*3/uL — AB (ref 4.0–10.5)

## 2016-05-19 LAB — HEPATIC FUNCTION PANEL
ALK PHOS: 95 U/L (ref 38–126)
ALT: 30 U/L (ref 17–63)
AST: 41 U/L (ref 15–41)
Albumin: 3.5 g/dL (ref 3.5–5.0)
BILIRUBIN DIRECT: 0.3 mg/dL (ref 0.1–0.5)
BILIRUBIN INDIRECT: 0.3 mg/dL (ref 0.3–0.9)
BILIRUBIN TOTAL: 0.6 mg/dL (ref 0.3–1.2)
TOTAL PROTEIN: 6.5 g/dL (ref 6.5–8.1)

## 2016-05-19 LAB — LIPID PANEL
CHOL/HDL RATIO: 9.1 ratio
CHOLESTEROL: 237 mg/dL — AB (ref 0–200)
HDL: 26 mg/dL — AB (ref >40)
LDL Cholesterol: 158 mg/dL — ABNORMAL HIGH (ref 0–99)
Triglycerides: 264 mg/dL — ABNORMAL HIGH (ref <150)
VLDL: 53 mg/dL — AB (ref 0–40)

## 2016-05-19 LAB — COMPREHENSIVE METABOLIC PANEL
ALBUMIN: 3.4 g/dL — AB (ref 3.5–5.0)
ALT: 30 U/L (ref 17–63)
ANION GAP: 10 (ref 5–15)
AST: 28 U/L (ref 15–41)
Alkaline Phosphatase: 94 U/L (ref 38–126)
BILIRUBIN TOTAL: 0.3 mg/dL (ref 0.3–1.2)
BUN: 5 mg/dL — AB (ref 6–20)
CHLORIDE: 107 mmol/L (ref 101–111)
CO2: 21 mmol/L — ABNORMAL LOW (ref 22–32)
Calcium: 8.8 mg/dL — ABNORMAL LOW (ref 8.9–10.3)
Creatinine, Ser: 0.98 mg/dL (ref 0.61–1.24)
GFR calc Af Amer: >60 mL/min (ref >60)
GFR calc non Af Amer: >60 mL/min (ref >60)
GLUCOSE: 117 mg/dL — AB (ref 65–99)
POTASSIUM: 3.6 mmol/L (ref 3.5–5.1)
Sodium: 138 mmol/L (ref 135–145)
TOTAL PROTEIN: 6.4 g/dL — AB (ref 6.5–8.1)

## 2016-05-19 LAB — POCT ACTIVATED CLOTTING TIME: ACTIVATED CLOTTING TIME: 439 s

## 2016-05-19 LAB — PROTIME-INR
INR: 1.04
INR: 4.4 — AB
PROTHROMBIN TIME: 13.6 s (ref 11.4–15.2)
PROTHROMBIN TIME: 41.9 s — AB (ref 11.4–15.2)

## 2016-05-19 LAB — MRSA PCR SCREENING: MRSA BY PCR: NEGATIVE

## 2016-05-19 LAB — APTT: aPTT: 29 seconds (ref 24–36)

## 2016-05-19 LAB — T4, FREE: FREE T4: 1.08 ng/dL (ref 0.61–1.12)

## 2016-05-19 LAB — MAGNESIUM: Magnesium: 1.8 mg/dL (ref 1.7–2.4)

## 2016-05-19 SURGERY — LEFT HEART CATH AND CORONARY ANGIOGRAPHY
Anesthesia: LOCAL

## 2016-05-19 MED ORDER — MORPHINE SULFATE (PF) 4 MG/ML IV SOLN: 2.0000 mg | INTRAVENOUS | Status: DC | PRN

## 2016-05-19 MED ORDER — METOPROLOL TARTRATE 25 MG PO TABS
25.0000 mg | ORAL_TABLET | Freq: Two times a day (BID) | ORAL | Status: DC
  Administered 2016-05-19 – 2016-05-21 (×4): 25 mg via ORAL
  Filled 2016-05-19 (×4): qty 1

## 2016-05-19 MED ORDER — ALPRAZOLAM 0.25 MG PO TABS: 0.2500 mg | ORAL_TABLET | Freq: Two times a day (BID) | ORAL | Status: DC | PRN

## 2016-05-19 MED ORDER — ATORVASTATIN CALCIUM 80 MG PO TABS
80.0000 mg | ORAL_TABLET | Freq: Every day | ORAL | Status: DC
  Administered 2016-05-20: 80 mg via ORAL
  Filled 2016-05-19: qty 1

## 2016-05-19 MED ORDER — SODIUM CHLORIDE 0.9% FLUSH: 3.0000 mL | INTRAVENOUS | Status: DC | PRN

## 2016-05-19 MED ORDER — ATORVASTATIN CALCIUM 80 MG PO TABS: 80.0000 mg | ORAL_TABLET | Freq: Every day | ORAL | Status: DC

## 2016-05-19 MED ORDER — HEPARIN (PORCINE) IN NACL 2-0.9 UNIT/ML-% IJ SOLN
INTRAMUSCULAR | Status: DC | PRN
  Administered 2016-05-19: 1000 mL

## 2016-05-19 MED ORDER — IOPAMIDOL (ISOVUE-370) INJECTION 76%
INTRAVENOUS | Status: DC | PRN
  Administered 2016-05-19: 200 mL via INTRA_ARTERIAL

## 2016-05-19 MED ORDER — SODIUM CHLORIDE 0.9 % IV SOLN
INTRAVENOUS | Status: AC
  Administered 2016-05-20: 05:00:00 via INTRAVENOUS

## 2016-05-19 MED ORDER — AMIODARONE HCL 150 MG/3ML IV SOLN
INTRAVENOUS | Status: AC
  Filled 2016-05-19: qty 6

## 2016-05-19 MED ORDER — TICAGRELOR 90 MG PO TABS
90.0000 mg | ORAL_TABLET | Freq: Two times a day (BID) | ORAL | Status: DC
  Administered 2016-05-20 – 2016-05-21 (×3): 90 mg via ORAL
  Filled 2016-05-19 (×3): qty 1

## 2016-05-19 MED ORDER — LABETALOL HCL 5 MG/ML IV SOLN: 10.0000 mg | INTRAVENOUS | Status: AC | PRN

## 2016-05-19 MED ORDER — NITROGLYCERIN 0.4 MG SL SUBL: 0.4000 mg | SUBLINGUAL_TABLET | SUBLINGUAL | Status: DC | PRN

## 2016-05-19 MED ORDER — BIVALIRUDIN 250 MG IV SOLR
INTRAVENOUS | Status: AC
  Filled 2016-05-19: qty 250

## 2016-05-19 MED ORDER — BIVALIRUDIN BOLUS VIA INFUSION - CUPID
INTRAVENOUS | Status: DC | PRN
  Administered 2016-05-19: 66.75 mg via INTRAVENOUS

## 2016-05-19 MED ORDER — SODIUM CHLORIDE 0.9 % IV SOLN: 250.0000 mL | INTRAVENOUS | Status: DC | PRN

## 2016-05-19 MED ORDER — SODIUM CHLORIDE 0.9% FLUSH
3.0000 mL | Freq: Two times a day (BID) | INTRAVENOUS | Status: DC
  Administered 2016-05-20: 3 mL via INTRAVENOUS

## 2016-05-19 MED ORDER — NITROGLYCERIN 1 MG/10 ML FOR IR/CATH LAB
INTRA_ARTERIAL | Status: AC
  Filled 2016-05-19: qty 10

## 2016-05-19 MED ORDER — LIDOCAINE HCL (PF) 1 % IJ SOLN
INTRAMUSCULAR | Status: DC | PRN
  Administered 2016-05-19: 2 mL

## 2016-05-19 MED ORDER — SODIUM CHLORIDE 0.9 % IV SOLN
INTRAVENOUS | Status: DC | PRN
  Administered 2016-05-19: 250 mL via INTRAVENOUS

## 2016-05-19 MED ORDER — SODIUM CHLORIDE 0.9 % IV SOLN
INTRAVENOUS | Status: DC | PRN
  Administered 2016-05-19: 1.75 mg/kg/h via INTRAVENOUS

## 2016-05-19 MED ORDER — ACETAMINOPHEN 325 MG PO TABS: 650.0000 mg | ORAL_TABLET | ORAL | Status: DC | PRN

## 2016-05-19 MED ORDER — ASPIRIN 81 MG PO CHEW
81.0000 mg | CHEWABLE_TABLET | Freq: Every day | ORAL | Status: DC
  Administered 2016-05-19 – 2016-05-21 (×3): 81 mg via ORAL
  Filled 2016-05-19 (×3): qty 1

## 2016-05-19 MED ORDER — VERAPAMIL HCL 2.5 MG/ML IV SOLN
INTRAVENOUS | Status: AC
  Filled 2016-05-19: qty 2

## 2016-05-19 MED ORDER — TICAGRELOR 90 MG PO TABS
ORAL_TABLET | ORAL | Status: DC | PRN
  Administered 2016-05-19: 180 mg via ORAL

## 2016-05-19 MED ORDER — HEPARIN (PORCINE) IN NACL 2-0.9 UNIT/ML-% IJ SOLN
INTRAMUSCULAR | Status: AC
  Filled 2016-05-19: qty 1000

## 2016-05-19 MED ORDER — SODIUM CHLORIDE 0.9 % IV SOLN
1.7500 mg/kg/h | INTRAVENOUS | Status: AC
  Administered 2016-05-19 (×2): 1.75 mg/kg/h via INTRAVENOUS
  Filled 2016-05-19 (×2): qty 250

## 2016-05-19 MED ORDER — TICAGRELOR 90 MG PO TABS
ORAL_TABLET | ORAL | Status: AC
  Filled 2016-05-19: qty 2

## 2016-05-19 MED ORDER — ASPIRIN EC 81 MG PO TBEC: 81.0000 mg | DELAYED_RELEASE_TABLET | Freq: Every day | ORAL | Status: DC

## 2016-05-19 MED ORDER — IOPAMIDOL (ISOVUE-370) INJECTION 76%
INTRAVENOUS | Status: AC
  Filled 2016-05-19: qty 50

## 2016-05-19 MED ORDER — AMIODARONE HCL 150 MG/3ML IV SOLN
INTRAVENOUS | Status: DC | PRN
  Administered 2016-05-19: 300 mg via INTRAVENOUS

## 2016-05-19 MED ORDER — LIDOCAINE HCL (PF) 1 % IJ SOLN
INTRAMUSCULAR | Status: AC
  Filled 2016-05-19: qty 30

## 2016-05-19 MED ORDER — HYDRALAZINE HCL 20 MG/ML IJ SOLN: 5.0000 mg | INTRAMUSCULAR | Status: AC | PRN

## 2016-05-19 MED ORDER — HEPARIN SODIUM (PORCINE) 1000 UNIT/ML IJ SOLN
INTRAMUSCULAR | Status: AC
  Filled 2016-05-19: qty 1

## 2016-05-19 MED ORDER — NITROGLYCERIN 1 MG/10 ML FOR IR/CATH LAB
INTRA_ARTERIAL | Status: DC | PRN
  Administered 2016-05-19: 200 ug via INTRACORONARY

## 2016-05-19 MED ORDER — TICAGRELOR 90 MG PO TABS: 90.0000 mg | ORAL_TABLET | Freq: Two times a day (BID) | ORAL | Status: DC

## 2016-05-19 MED ORDER — NICOTINE 14 MG/24HR TD PT24
14.0000 mg | MEDICATED_PATCH | Freq: Every day | TRANSDERMAL | Status: DC
  Administered 2016-05-19 – 2016-05-20 (×2): 14 mg via TRANSDERMAL
  Filled 2016-05-19 (×3): qty 1

## 2016-05-19 MED ORDER — IOPAMIDOL (ISOVUE-370) INJECTION 76%
INTRAVENOUS | Status: AC
  Filled 2016-05-19: qty 125

## 2016-05-19 MED ORDER — ONDANSETRON HCL 4 MG/2ML IJ SOLN: 4.0000 mg | Freq: Four times a day (QID) | INTRAMUSCULAR | Status: DC | PRN

## 2016-05-19 MED ORDER — SODIUM CHLORIDE 0.9% FLUSH: 3.0000 mL | Freq: Two times a day (BID) | INTRAVENOUS | Status: DC

## 2016-05-19 MED ORDER — VERAPAMIL HCL 2.5 MG/ML IV SOLN
INTRA_ARTERIAL | Status: DC | PRN
  Administered 2016-05-19: 10 mL via INTRA_ARTERIAL

## 2016-05-19 MED ORDER — HEPARIN SODIUM (PORCINE) 1000 UNIT/ML IJ SOLN
INTRAMUSCULAR | Status: DC | PRN
  Administered 2016-05-19: 4000 [IU] via INTRAVENOUS

## 2016-05-19 MED ORDER — ONDANSETRON HCL 4 MG/2ML IJ SOLN
4.0000 mg | Freq: Four times a day (QID) | INTRAMUSCULAR | Status: DC | PRN
  Administered 2016-05-20: 4 mg via INTRAVENOUS
  Filled 2016-05-19: qty 2

## 2016-05-19 MED ORDER — ZOLPIDEM TARTRATE 5 MG PO TABS: 5.0000 mg | ORAL_TABLET | Freq: Every evening | ORAL | Status: DC | PRN

## 2016-05-19 SURGICAL SUPPLY — 23 items
BALLN MOZEC 2.0X12 (BALLOONS) ×2
BALLOON MOZEC 2.0X12 (BALLOONS) ×1 IMPLANT
CATH INFINITI 5 FR JL3.5 (CATHETERS) ×2 IMPLANT
CATH INFINITI 5FR ANG PIGTAIL (CATHETERS) ×2 IMPLANT
CATH OPTITORQUE TIG 4.0 5F (CATHETERS) ×2 IMPLANT
CATH VISTA GUIDE 6FR XB3.5 (CATHETERS) ×2 IMPLANT
DEVICE RAD COMP TR BAND LRG (VASCULAR PRODUCTS) ×2 IMPLANT
ELECT DEFIB PAD ADLT CADENCE (PAD) ×2 IMPLANT
GLIDESHEATH SLEND A-KIT 6F 22G (SHEATH) ×2 IMPLANT
GUIDEWIRE INQWIRE 1.5J.035X260 (WIRE) ×1 IMPLANT
INQWIRE 1.5J .035X260CM (WIRE) ×2
KIT ENCORE 26 ADVANTAGE (KITS) ×2 IMPLANT
KIT HEART LEFT (KITS) ×2 IMPLANT
PACK CARDIAC CATHETERIZATION (CUSTOM PROCEDURE TRAY) ×2 IMPLANT
SHEATH PINNACLE 6F 10CM (SHEATH) ×2 IMPLANT
STENT SYNERGY DES 2.75X12 (Permanent Stent) ×2 IMPLANT
STENT SYNERGY DES 3X16 (Permanent Stent) ×2 IMPLANT
SYR MEDRAD MARK V 150ML (SYRINGE) ×2 IMPLANT
TRANSDUCER W/STOPCOCK (MISCELLANEOUS) ×2 IMPLANT
TUBING CIL FLEX 10 FLL-RA (TUBING) ×2 IMPLANT
WIRE ASAHI PROWATER 180CM (WIRE) ×2 IMPLANT
WIRE EMERALD 3MM-J .035X150CM (WIRE) ×2 IMPLANT
WIRE HI TORQ VERSACORE-J 145CM (WIRE) ×2 IMPLANT

## 2016-05-19 NOTE — H&P (Signed)
Don Fletcher is an 46 y.o. male.    Primary Cardiologist:Dr. Wyline Mood   PCP:  Ernestine Conrad, MD  Chief Complaint: chest pain, STEMI  HPI: 46 yo m with hx of STEMI in 12/16/2013 with BMS to RPLA.  Recath in 2016 with single vessel CAD with occlusion of the PLA stetn in there RCA, collateralized from Left coronary artery, diffuse non obst CAD.  EF 45%, medical therapy.   Pt now presents with STEMI, inf wall, st elevation, 2-3 days ago pt had chest paina nd was seen at [facility],  They contacted Dr. Wyline Mood and troponin of 0.01 and 0.02 and gave directions to transfer to [facility]-  but pt did not like the care and left - he did well at home until today and he developed significant mid sternal chest pain around 4 pm while watching TV,  stood up and became nauseated, diaphoretic and SOB also pale per his wife.  EMS was called and + ST elevation.  STEMI called, pt has rec'd 4 baby ASA, 4 SL NTG and 4 mg MSO4 but on arrival still with chest pain..  Dr. Allyson Sabal has seen and examined and taking to the cath lab emergently.    Pt has not been taking any medication, he cannot afford.  Pt does not read or write and does not understand the medications.  He does smoke 1 ppd down from 3ppd in 2015.     Past Medical History:  Diagnosis Date  . Bipolar 1 disorder (HCC)   . CAD (coronary artery disease)    a. 12/16/13 inferolat STEMI s/p BMS to RPL. b. Cath 08/2014: interim occlusion of the PLA stent of the RCA, collateralized from the left coronary artery with diffuse nonobstructive CAD.  [name] Chronic back pain   . HLD (hyperlipidemia)   . Hypertension   . Ischemic cardiomyopathy    a. 12/2013: EF 50-55% by echo with +WMA. b. EF 45% by cath in 08/2014.  [name] Peptic ulcer disease   . Schizophrenic disorder (HCC)   . Tobacco abuse     Past Surgical History:  Procedure Laterality Date  . CARDIAC CATHETERIZATION N/A 08/07/2014   Procedure: Left Heart Cath and Coronary Angiography;  Surgeon: Tonny Bollman, MD;  Location: [facility] INVASIVE CV LAB;  Service: Cardiovascular;  Laterality: N/A;  . HERNIA REPAIR    . LEFT HEART CATHETERIZATION WITH CORONARY ANGIOGRAM N/A 12/16/2013   Procedure: LEFT HEART CATHETERIZATION WITH CORONARY ANGIOGRAM;  Surgeon: Corky Crafts, MD;  Location: [facility] CATH LAB;  Service: Cardiovascular;  Laterality: N/A;  . PERCUTANEOUS CORONARY STENT INTERVENTION (PCI-S)  12/16/2013   Procedure: PERCUTANEOUS CORONARY STENT INTERVENTION (PCI-S);  Surgeon: Corky Crafts, MD;  Location: [facility] CATH LAB;  Service: Cardiovascular;;  distal rca    Family History  Problem Relation Age of Onset  . Heart attack Father 6  . Heart attack Brother 68   Social History:  reports that he has been smoking Cigarettes.  He started smoking about 28 years ago. He has a 12.50 pack-year smoking history. He quit smokeless tobacco use about 28 years ago. His smokeless tobacco use included Snuff. He reports that he uses drugs, including Marijuana. He reports that he does not drink alcohol.  Allergies:  Allergies  Allergen Reactions  . Benadryl [Diphenhydramine] Other (See Comments)    LEG CRAMPING  . Seroquel [Quetiapine] Other (See Comments)    "makes my legs cramp"  . Tylenol Pm Extra [Diphenhydramine-Apap (Sleep)]  Other (See Comments)    "makes my leg cramp"   OUTPATIENT MEDICATIONS: none  No results found for this or any previous visit (from the past 48 hour(s)). No results found.  ROS: General:no colds or fevers, no weight changes Skin:no rashes or ulcers HEENT:no blurred vision, no congestion CV:see HPI PUL:see HPI GI:no diarrhea constipation or melena, no indigestion GU:no hematuria, no dysuria MS:no joint pain, no claudication Neuro:no syncope, no lightheadedness Endo:no diabetes, no thyroid disease Unable to afford medications  VS: BP 126/77 P 110 R 20  PE: General:Pleasant affect, having pain Skin:Warm and dry, brisk capillary refill HEENT:normocephalic, sclera  clear, mucus membranes moist Neck:supple, no JVD, no bruits  Heart:S1S2 RRR without murmur, gallup, rub or click Lungs:clear without rales, rhonchi, or wheezes QMV:HQIO, non tender, + BS, do not palpate liver spleen or masses Ext:no lower ext edema, 2+ pedal pulses, 2+ radial pulses Neuro:alert and oriented X 3, MAE, follows commands, + facial symmetry    Assessment/Plan STEMI  Inf wall to cath laba emergently.   CAD with previous STEMI of inf. Wall in 2015 with BMS to PLA, re cath in 2016 with occluded stent.    HLD add high dose statin  BIpolar disease   Tobacco use will use nicoderm patch   COPD has been followed by Dr. Cassandria Santee Nurse Practitioner Certified [facility] Pager [phone] or after 5pm or weekends call [phone] 05/19/2016, 6:12 PM  Agree with note written by Don Fletcher RNP  46 y/o MWM with H/O HTN, HLD, tob and prior CAD. S/P BMS implanted by Dr Eldridge Dace 2015 in PLA branch . Relook by Dr Excell Seltzer 2016 revealed occluded stent with L----> R collat. Med Rx was recommended. Pt stopped taking all meds 3 months ago. Developed CP at 4 pm this afternoon. EMS was called. EKG showed IL ST elevation. He was transported to [facility] for cath and possible intervention.    Don Fletcher 05/19/2016 7:30 PM

## 2016-05-20 DIAGNOSIS — I25110 Atherosclerotic heart disease of native coronary artery with unstable angina pectoris: Secondary | ICD-10-CM

## 2016-05-20 DIAGNOSIS — E782 Mixed hyperlipidemia: Secondary | ICD-10-CM

## 2016-05-20 DIAGNOSIS — Z720 Tobacco use: Secondary | ICD-10-CM

## 2016-05-20 LAB — LIPID PANEL
CHOLESTEROL: 224 mg/dL — AB (ref 0–200)
HDL: 24 mg/dL — ABNORMAL LOW (ref >40)
LDL CALC: 158 mg/dL — AB (ref 0–99)
TRIGLYCERIDES: 210 mg/dL — AB (ref <150)
Total CHOL/HDL Ratio: 9.3 RATIO
VLDL: 42 mg/dL — ABNORMAL HIGH (ref 0–40)

## 2016-05-20 LAB — BASIC METABOLIC PANEL
Anion gap: 8 (ref 5–15)
BUN: 7 mg/dL (ref 6–20)
CALCIUM: 9 mg/dL (ref 8.9–10.3)
CHLORIDE: 108 mmol/L (ref 101–111)
CO2: 22 mmol/L (ref 22–32)
CREATININE: 0.94 mg/dL (ref 0.61–1.24)
GFR calc non Af Amer: >60 mL/min (ref >60)
Glucose, Bld: 104 mg/dL — ABNORMAL HIGH (ref 65–99)
Potassium: 4.2 mmol/L (ref 3.5–5.1)
SODIUM: 138 mmol/L (ref 135–145)

## 2016-05-20 LAB — CBC
HCT: 47 % (ref 39.0–52.0)
Hemoglobin: 16.2 g/dL (ref 13.0–17.0)
MCH: 31.2 pg (ref 26.0–34.0)
MCHC: 34.5 g/dL (ref 30.0–36.0)
MCV: 90.4 fL (ref 78.0–100.0)
PLATELETS: 220 10*3/uL (ref 150–400)
RBC: 5.2 MIL/uL (ref 4.22–5.81)
RDW: 13.3 % (ref 11.5–15.5)
WBC: 10.3 10*3/uL (ref 4.0–10.5)

## 2016-05-20 LAB — TROPONIN I
TROPONIN I: 14.9 ng/mL — AB (ref <0.03)
TROPONIN I: 20.69 ng/mL — AB (ref <0.03)
TROPONIN I: 13.25 ng/mL — AB (ref <0.03)

## 2016-05-20 LAB — HEMOGLOBIN A1C
HEMOGLOBIN A1C: 5.5 % (ref 4.8–5.6)
HEMOGLOBIN A1C: 5.4 % (ref 4.8–5.6)
Mean Plasma Glucose: 111 mg/dL
Mean Plasma Glucose: 108 mg/dL

## 2016-05-20 LAB — TSH: TSH: 2.779 u[IU]/mL (ref 0.350–4.500)

## 2016-05-20 LAB — HIV ANTIBODY (ROUTINE TESTING W REFLEX): HIV SCREEN 4TH GENERATION: NONREACTIVE

## 2016-05-20 MED ORDER — ALUM & MAG HYDROXIDE-SIMETH 200-200-20 MG/5ML PO SUSP
30.0000 mL | ORAL | Status: DC | PRN
  Administered 2016-05-20: 30 mL via ORAL
  Filled 2016-05-20: qty 30

## 2016-05-20 NOTE — Progress Notes (Signed)
CARDIAC REHAB PHASE I    [phone] Patient refusing to ambulate. MI discharge education completed and handouts reviewed however patient only able to rely on verbal instruction. Patient stated he is unable to read or write. Encouraged patient to have wife read all education when she comes later today so that primary RN can reinforce any education. Patient also states he will not be able to afford mediations. Spoke with Child psychotherapist and it is determined with medicaid patients "life saving medications will be free" and his other medications will be 4.00/month. Patient says he is not interested in quitting smoking. He also states he is bipolar and after his last MI 4 years ago he just threw all his medications in the trash. He does not drink alcohol. Medication compliance was strongly reinforced. Patient has allowed RN to place phase 2 cardiac referral to [facility] however he states he will not attend. He has been married for 4 years and his wife is also disabled. I have personally spoke with primary RN about all the patients social issues. Patient was encouraged to ambulate later today.  Zowie Lundahl English PayneRN, BSN 05/20/2016 12:39 PM

## 2016-05-20 NOTE — Progress Notes (Signed)
Pt seemed very agitated and stated, "I'm ready to leave this place now.  I don't like the food and I don't like anything here".  Pt was advised that the best thing for him was to stay and wait for the Cardiology MD in am so he could d/c him home and be able to get any Prescriptions needed as well as d/c instructions/paperwork.  Food was offered to him but he did not want anything.  Oncoming RN made aware that pt may be wishing to leave AMA.

## 2016-05-20 NOTE — Progress Notes (Signed)
Progress Note  Patient Name: Don Fletcher Date of Encounter: 05/20/2016  Primary Cardiologist: Branch  Subjective   46 yo with known CAD  Presented with Inf. STEMI,   Has occluded RCA with collateral filling from the LCx.  Presented yesterday with occluded LCX which was stented.  Smoker  Feeling better.  No ectopy   Inpatient Medications    Scheduled Meds: . aspirin  81 mg Oral Daily  . atorvastatin  80 mg Oral q1800  . metoprolol tartrate  25 mg Oral BID  . nicotine  14 mg Transdermal Daily  . sodium chloride flush  3 mL Intravenous Q12H  . sodium chloride flush  3 mL Intravenous Q12H  . ticagrelor  90 mg Oral BID   Continuous Infusions:  PRN Meds: sodium chloride, sodium chloride, acetaminophen, ALPRAZolam, alum & mag hydroxide-simeth, morphine injection, nitroGLYCERIN, ondansetron (ZOFRAN) IV, sodium chloride flush, sodium chloride flush, zolpidem   Vital Signs    Vitals:   05/20/16 0500 05/20/16 0600 05/20/16 0700 05/20/16 0742  BP: 111/67 (!) 156/99 (!) 124/91   Pulse: 64 74 65   Resp: 11 (!) 9 14   Temp:    97.5 F (36.4 C)  TempSrc:    Oral  SpO2: 96% 97% 96%   Weight: 185 lb 6.5 oz (84.1 kg)     Height:        Intake/Output Summary (Last 24 hours) at 05/20/16 0751 Last data filed at 05/20/16 0700  Gross per 24 hour  Intake          1258.95 ml  Output             1025 ml  Net           233.95 ml   Filed Weights   05/19/16 1855 05/20/16 0500  Weight: 180 lb 12.4 oz (82 kg) 185 lb 6.5 oz (84.1 kg)    Telemetry    NSR  - Personally Reviewed  ECG    NSR  - Personally Reviewed  Physical Exam   GEN: No acute distress.   No pain  Neck: No JVD, supple  Cardiac: RRR, no murmurs,  Respiratory: Clear to auscultation bilaterally. GI: Soft, nontender, non-distended  MS:  right radial cath site looks great  Neuro:  Nonfocal ,   Psych: affect is normal    Labs    Chemistry Recent Labs Lab 05/19/16 1800 05/19/16 1801 05/19/16 1900  NA  138 140  --   K 3.6 3.8  --   CL 107 105  --   CO2 21*  --   --   GLUCOSE 117* 118*  --   BUN 5* 6  --   CREATININE 0.98 0.80  --   CALCIUM 8.8*  --   --   PROT 6.4*  --  6.5  ALBUMIN 3.4*  --  3.5  AST 28  --  41  ALT 30  --  30  ALKPHOS 94  --  95  BILITOT 0.3  --  0.6  GFRNONAA >60  --   --   GFRAA >60  --   --   ANIONGAP 10  --   --      Hematology Recent Labs Lab 05/19/16 1800 05/19/16 1801 05/19/16 1900 05/20/16 0643  WBC 14.0*  --  15.8* 10.3  RBC 5.21  --  5.06 5.20  HGB 16.4 16.3 16.0 16.2  HCT 46.7 48.0 45.5 47.0  MCV 89.6  --  89.9 90.4  MCH  31.5  --  31.6 31.2  MCHC 35.1  --  35.2 34.5  RDW 13.2  --  13.1 13.3  PLT 250  --  241 220    Cardiac Enzymes Recent Labs Lab 05/19/16 1800 05/19/16 1900 05/20/16 0013  TROPONINI <0.03 2.63* 20.69*   No results for input(s): TROPIPOC in the last 168 hours.   BNPNo results for input(s): BNP, PROBNP in the last 168 hours.   DDimer No results for input(s): DDIMER in the last 168 hours.   Radiology    Portable Chest X-ray 1 View  Result Date: 05/19/2016 CLINICAL DATA:  Acute onset of myocardial infarction. Initial encounter. EXAM: PORTABLE CHEST 1 VIEW COMPARISON:  Chest radiograph performed 05/16/2016 FINDINGS: The lungs are well-aerated and clear. There is no evidence of focal opacification, pleural effusion or pneumothorax. The cardiomediastinal silhouette is within normal limits. No acute osseous abnormalities are seen. IMPRESSION: No acute cardiopulmonary process seen. Electronically Signed   By: Roanna Raider M.D.   On: 05/19/2016 21:31    Cardiac Studies     Patient Profile     46 y.o. male with CAD , ongoing cigarette smoking   Assessment & Plan    1. CAD / STEMI:   Due to an occluded LCX that supplied collaterals to an occluded RCA Feeling well. Has not ambulated yet. No arrhymias. Encouraged cigarette smoking cessation .  On ASA, Brilinta, metoprolol 25 BID,  Transfer to [facility].  2.   Hyperlipidemia :  LDL is 158 .  Was not taking his statin.   Has been started on Atorvastatin 80 mg     Signed, Kristeen Miss, MD  05/20/2016, 7:51 AM

## 2016-05-21 ENCOUNTER — Inpatient Hospital Stay (HOSPITAL_COMMUNITY): Payer: Medicaid Other

## 2016-05-21 NOTE — Progress Notes (Addendum)
Pt leaving AMA. Had attempted to convince pt to wait but is very adamant about going home. He states "  I can't afford the medications anyway", even after he was offered Brilinta assistance program, had spoken with him about his activities on his right arm, site of his cardiac cath access. To limit carrying greater than 5 lbs or to work on manually ( change spark plugs per pt) on his car prior to MD clearance. Pt had stated " I will talk to my doctor and see if he can help me. AMA form signed. Charge nurse had also spoken with pt about above.  Had also spoken to him if he cannot afford any of his medication to at least try to get baby aspirin and take it daily. Marvis Repress NP updated  Of above

## 2016-05-21 NOTE — Progress Notes (Signed)
Patient does not want 2d echo. Stated he would have one done when he saw Dr. Wyline Mood in the office. Informed patient if he did echo now Dr.Branch would already have it when he saw him. Still did not want test.   Jeryl Columbia, RDCS

## 2016-05-22 ENCOUNTER — Encounter (HOSPITAL_COMMUNITY): Payer: Self-pay | Admitting: Cardiovascular Disease

## 2016-05-22 ENCOUNTER — Telehealth: Payer: Self-pay | Admitting: Cardiology

## 2016-05-22 NOTE — Telephone Encounter (Signed)
Don Fletcher called the [facility] stating that he checked himself out of [facility] on  05/21/16.  States that he was not given any medications.  Please advise.

## 2016-05-22 NOTE — Telephone Encounter (Signed)
Scheduled patient an appointment with Dr. Wyline Mood for 05/24/16 at 10:20 in [place]

## 2016-05-22 NOTE — Telephone Encounter (Signed)
Patient stated he had a heart attack last week. [facility]. Patient stated he is bipolar and does not like people touching him. Patient stated that being in the hospital for 3 days was enough so he decided to sign himself out. Patient stated he does not want this to happen again, so would like to know what Dr. Wyline Mood recommends as far as medication. Best contact number is [phone]. Routing to Dr. Wyline Mood

## 2016-05-22 NOTE — Discharge Summary (Signed)
Discharge Summary    Patient ID: Don Fletcher,  MRN: [id number], DOB/AGE: July 08, 1970 46 y.o.  Admit date: 05/19/2016 Discharge date: 05/22/2016  Primary Care Provider: Ernestine Fletcher Primary Cardiologist: Don Ferry, MD   Discharge Diagnoses    Principal Problem:   Acute ST elevation myocardial infarction (STEMI) involving left circumflex coronary artery without development of Q waves (HCC) Active Problems:   CAD (coronary artery disease)   Ischemic cardiomyopathy   Bipolar 1 disorder (HCC)   HLD (hyperlipidemia)   Hypertension   Family history of premature CAD   Tobacco abuse  Allergies Allergies  Allergen Reactions  . Suboxone [Buprenorphine Hcl-Naloxone Hcl]     Caused kidney failure  . Benadryl [Diphenhydramine] Other (See Comments)    LEG CRAMPING  . Seroquel [Quetiapine] Other (See Comments)    "makes my legs cramp"  . Tylenol Pm Extra [Diphenhydramine-Apap (Sleep)] Other (See Comments)    "makes my leg cramp"    Diagnostic Studies/Procedures    Cardiac Catheterization and Percutaneous Coronary Intervention 4.13.2018  Dominance: Right  Left Circumflex  Mid Cx to Dist Cx lesion, 100% stenosed.  Angioplasty: 2.75 x 12 mm Synergy DES was successfully placed.  There is no residual stenosis post intervention.  Right Coronary Artery  Prox RCA lesion, 30% stenosed.  Right Posterior Descending Artery  Ost RPDA to RPDA lesion, 75% stenosed.  Right Posterior Atrioventricular Branch  Post Atrio filled by collaterals from Mid LAD.  Post Atrio lesion, 100% stenosed. The lesion was previously treated.  _____________   History of Present Illness     46 y/o ? with a h/o CAD s/p prior STEMI and BMS to the RPLA in 12/2013, HTN, HL, ICM (EF 45%), bipolar d/o, and schizophrenia.  He was in his usual state of health until about 2-3 days prior to admission, when he developed chest pain and presented to [facility]. Troponin was mildly elevated and recommendation was made  for transfer to [facility], however pt ended up leaving the ER AMA.  On 4/13 he had recurrent chest pain associated with nausea, dyspnea, and diaphoresis.  EMS was called and he was noted to have ST elevation.  A code STEMI was called and he was taken to [facility] for emergent evaluation.  Hospital Course     Consultants: None   Patient underwent emergent diagnostic catheterization revealing a total occlusion of the mid left circumflex.  He was known to have occlusion of the previously placed RPLA stent.  The LCX was successfully treated with a 2.75 x 12 mm synergy DES.  He tolerated the procedure well and was subsequently transferred to the coronary intensive care unit, where he eventually peaked his troponin at 20.69.  He was maintained on asa, statin,  blocker, and brilinta therapy.  He had no further c/p and was transferred out to the floor on 4/14.  Unfortunately, on the morning of 4/15, [name] left AMA prior to being seen by our team.  He told nursing staff that he did not want any prescriptions or information for medication assistance because he wasn't planning on getting his medications related to financial concerns.  We will have our office reach out to him. _____________  Discharge Vitals Blood pressure (!) 146/75, pulse 75, temperature 97.9 F (36.6 C), temperature source Oral, resp. rate 16, height  (1.727 m), weight 183 lb 11.2 oz (83.3 kg), SpO2 100 %.  Filed Weights   05/19/16 1855 05/20/16 0500 05/21/16 0500  Weight: 180 lb 12.4  oz (82 kg) 185 lb 6.5 oz (84.1 kg) 183 lb 11.2 oz (83.3 kg)    Labs & Radiologic Studies    CBC  Recent Labs  05/19/16 1900 05/20/16 0643  WBC 15.8* 10.3  NEUTROABS 13.0*  --   HGB 16.0 16.2  HCT 45.5 47.0  MCV 89.9 90.4  PLT 241 220   Basic Metabolic Panel  Recent Labs  05/19/16 1800 05/19/16 1801 05/19/16 1900 05/20/16 0643  NA 138 140  --  138  K 3.6 3.8  --  4.2  CL 107 105  --  108  CO2 21*  --   --  22  GLUCOSE 117* 118*   --  104*  BUN 5* 6  --  7  CREATININE 0.98 0.80  --  0.94  CALCIUM 8.8*  --   --  9.0  MG  --   --  1.8  --    Liver Function Tests  Recent Labs  05/19/16 1800 05/19/16 1900  AST 28 41  ALT 30 30  ALKPHOS 94 95  BILITOT 0.3 0.6  PROT 6.4* 6.5  ALBUMIN 3.4* 3.5   Cardiac Enzymes  Recent Labs  05/19/16 1900 05/20/16 0013 05/20/16 0643  TROPONINI 2.63* 20.69* 13.25*  14.90*   Hemoglobin A1C  Recent Labs  05/19/16 1900  HGBA1C 5.4   Fasting Lipid Panel  Recent Labs  05/20/16 0643  CHOL 224*  HDL 24*  LDLCALC 158*  TRIG 210*  CHOLHDL 9.3   Thyroid Function Tests  Recent Labs  05/19/16 1900  TSH 2.779   _____________  Portable Chest X-ray 1 View  Result Date: 05/19/2016 CLINICAL DATA:  Acute onset of myocardial infarction. Initial encounter. EXAM: PORTABLE CHEST 1 VIEW COMPARISON:  Chest radiograph performed 05/16/2016 FINDINGS: The lungs are well-aerated and clear. There is no evidence of focal opacification, pleural effusion or pneumothorax. The cardiomediastinal silhouette is within normal limits. No acute osseous abnormalities are seen. IMPRESSION: No acute cardiopulmonary process seen. Electronically Signed   By: Roanna Raider M.D.   On: 05/19/2016 21:31   Disposition   Pt is being discharged home today in good condition.  Follow-up Plans & Appointments     Discharge Instructions    Amb Referral to Cardiac Rehabilitation    Complete by:  As directed    Diagnosis:   Coronary Stents STEMI        Discharge Medications   Pt left without receiving prescriptions.    Outstanding Labs/Studies   none  Duration of Discharge Encounter   Greater than 30 minutes including physician time.  Signed, Nicolasa Ducking NP 05/22/2016, 9:40 AM  Attending Note:   The patient was seen and examined.  Agree with assessment and plan as noted above.  Changes made to the above note as needed.  Patient seen and independently examined with Ward Givens, AP .   We discussed all aspects of the encounter. I agree with the assessment and plan as stated above.  1. CAD :  Pt has done well s/p stenting. Stable for DC     I have spent a total of 40 minutes with patient reviewing hospital  notes , telemetry, EKGs, labs and examining patient as well as establishing an assessment and plan that was discussed with the patient. > 50% of time was spent in direct patient care.  See progress note from same day .   Vesta Mixer, Montez Hageman., MD, [facility] 05/24/2016, 1:32 PM [address]  [address] Office -  [phone] Pager [phone]

## 2016-05-23 MED ORDER — CLOPIDOGREL BISULFATE 75 MG PO TABS: 75.0000 mg | ORAL_TABLET | Freq: Every day | ORAL | 3 refills | Status: DC

## 2016-05-23 NOTE — Telephone Encounter (Signed)
Change appt time to 120pm. He at a minimum should be on aspirin  daily which is over the counter, needs prescription for plavix  daily that he must start today.    Dina Rich MD

## 2016-05-23 NOTE — Telephone Encounter (Signed)
Patient notified. New orders sent to pharmacy

## 2016-06-01 ENCOUNTER — Encounter: Payer: Medicaid Other | Admitting: Cardiology

## 2016-06-19 ENCOUNTER — Ambulatory Visit (INDEPENDENT_AMBULATORY_CARE_PROVIDER_SITE_OTHER): Payer: Medicaid Other | Admitting: Cardiology

## 2016-06-19 ENCOUNTER — Encounter: Payer: Self-pay | Admitting: Cardiology

## 2016-06-19 VITALS — BP 148/89 | HR 78 | Ht 67.0 in | Wt 181.8 lb

## 2016-06-19 DIAGNOSIS — I10 Essential (primary) hypertension: Secondary | ICD-10-CM | POA: Diagnosis not present

## 2016-06-19 DIAGNOSIS — I2510 Atherosclerotic heart disease of native coronary artery without angina pectoris: Secondary | ICD-10-CM | POA: Diagnosis not present

## 2016-06-19 DIAGNOSIS — E782 Mixed hyperlipidemia: Secondary | ICD-10-CM | POA: Diagnosis not present

## 2016-06-19 MED ORDER — LISINOPRIL 10 MG PO TABS: 10.0000 mg | ORAL_TABLET | Freq: Every day | ORAL | 1 refills | Status: DC

## 2016-06-19 MED ORDER — ATORVASTATIN CALCIUM 80 MG PO TABS: 80.0000 mg | ORAL_TABLET | Freq: Every day | ORAL | 1 refills | Status: DC

## 2016-06-19 NOTE — Progress Notes (Signed)
Clinical Summary Don Fletcher is a 46 y.o.male seen today for follow up of the following medical problems.   1. CAD - pt admitted 12/16/13 with inferolateral STEMI, received BMS to proximal RPL. Full cath report below. LVEF 55% by LVgram, LVEF 50-55% by echo.  - on DAPT with ASA and effient, initially on brillinta but had some SOB and was changed while in the hospital  - Lexiscan MPI after cath in setting of chest pain Jan 2016 with inferior scar with mild to moderate ischemia, LVEF 60%. Overall low risk. Managed medically - admit 07/2014 with chest pain, cath showed occluded in the PLA with left to right collateralization, managed medically.  -Admit 05/2016 with STEMI, received DES to LCX. - patient left AMA shortly after procedure - denies any chest pain. No SOB or DOE   2. COPD - followed by Dr Juanetta Gosling.    3. Abdominal tenderness - pain comes and goes LUQ.  - worst with palpation  4. Depression - follow by psychiatry Past Medical History:  Diagnosis Date  . Bipolar 1 disorder (HCC)   . CAD (coronary artery disease)    a. 12/16/13 inferolat STEMI s/p BMS to RPL. b. Cath 08/2014: interim occlusion of the PLA stent of the RCA, collateralized from the left coronary artery with diffuse nonobstructive CAD;  c. STEMI/PCI: LM nl, LAD nl, LCX 125m/d (2.75x12 synergy DES), RCA 30p, RPDA 75ost/p, RPAV 100 w/ L->R collats.  . Chronic back pain   . HLD (hyperlipidemia)   . Hypertension   . Ischemic cardiomyopathy    a. 12/2013: EF 50-55% by echo with +WMA. b. EF 45% by cath in 08/2014.  . Ischemic cardiomyopathy 05/19/2016  . Peptic ulcer disease   . Schizophrenic disorder (HCC)   . Tobacco abuse      Allergies  Allergen Reactions  . Suboxone [Buprenorphine Hcl-Naloxone Hcl]     Caused kidney failure  . Benadryl [Diphenhydramine] Other (See Comments)    LEG CRAMPING  . Seroquel [Quetiapine] Other (See Comments)    "makes my legs cramp"  . Tylenol Pm Extra  [Diphenhydramine-Apap (Sleep)] Other (See Comments)    "makes my leg cramp"     Current Outpatient Prescriptions  Medication Sig Dispense Refill  . clopidogrel (PLAVIX) 75 MG tablet Take 1 tablet (75 mg total) by mouth daily. 90 tablet 3   No current facility-administered medications for this visit.      Past Surgical History:  Procedure Laterality Date  . CARDIAC CATHETERIZATION N/A 08/07/2014   Procedure: Left Heart Cath and Coronary Angiography;  Surgeon: Tonny Bollman, MD;  Location: [facility] INVASIVE CV LAB;  Service: Cardiovascular;  Laterality: N/A;  . CORONARY STENT INTERVENTION N/A 05/19/2016   Procedure: Coronary Stent Intervention;  Surgeon: Runell Gess, MD;  Location: [facility] INVASIVE CV LAB;  Service: Cardiovascular;  Laterality: N/A;  . HERNIA REPAIR    . LEFT HEART CATH AND CORONARY ANGIOGRAPHY N/A 05/19/2016   Procedure: Left Heart Cath and Coronary Angiography;  Surgeon: Runell Gess, MD;  Location: [facility] INVASIVE CV LAB;  Service: Cardiovascular;  Laterality: N/A;  . LEFT HEART CATHETERIZATION WITH CORONARY ANGIOGRAM N/A 12/16/2013   Procedure: LEFT HEART CATHETERIZATION WITH CORONARY ANGIOGRAM;  Surgeon: Corky Crafts, MD;  Location: [facility] CATH LAB;  Service: Cardiovascular;  Laterality: N/A;  . PERCUTANEOUS CORONARY STENT INTERVENTION (PCI-S)  12/16/2013   Procedure: PERCUTANEOUS CORONARY STENT INTERVENTION (PCI-S);  Surgeon: Corky Crafts, MD;  Location: [facility] CATH LAB;  Service: Cardiovascular;;  distal  rca     Allergies  Allergen Reactions  . Suboxone [Buprenorphine Hcl-Naloxone Hcl]     Caused kidney failure  . Benadryl [Diphenhydramine] Other (See Comments)    LEG CRAMPING  . Seroquel [Quetiapine] Other (See Comments)    "makes my legs cramp"  . Tylenol Pm Extra [Diphenhydramine-Apap (Sleep)] Other (See Comments)    "makes my leg cramp"      Family History  Problem Relation Age of Onset  . Heart attack Father 37  . Heart attack Brother 18      Social History Don Fletcher reports that he has been smoking Cigarettes.  He started smoking about 28 years ago. He has a 12.50 pack-year smoking history. He quit smokeless tobacco use about 28 years ago. His smokeless tobacco use included Snuff. Don Fletcher reports that he does not drink alcohol.   Review of Systems CONSTITUTIONAL: No weight loss, fever, chills, weakness or fatigue.  HEENT: Eyes: No visual loss, blurred vision, double vision or yellow sclerae.No hearing loss, sneezing, congestion, runny nose or sore throat.  SKIN: No rash or itching.  CARDIOVASCULAR: per hpi RESPIRATORY: No shortness of breath, cough or sputum.  GASTROINTESTINAL: No anorexia, nausea, vomiting or diarrhea. No abdominal pain or blood.  GENITOURINARY: No burning on urination, no polyuria NEUROLOGICAL: No headache, dizziness, syncope, paralysis, ataxia, numbness or tingling in the extremities. No change in bowel or bladder control.  MUSCULOSKELETAL: No muscle, back pain, joint pain or stiffness.  LYMPHATICS: No enlarged nodes. No history of splenectomy.  PSYCHIATRIC: No history of depression or anxiety.  ENDOCRINOLOGIC: No reports of sweating, cold or heat intolerance. No polyuria or polydipsia.  [name]   Physical Examination Vitals:   06/19/16 1540  BP: (!) 148/89  Pulse: 78   Vitals:   06/19/16 1540  Weight: 181 lb 12.8 oz (82.5 kg)  Height: 5\' 7"  (1.702 m)    Gen: resting comfortably, no acute distress HEENT: no scleral icterus, pupils equal round and reactive, no palptable cervical adenopathy,  CV: RRR, no m/r/g, no jvd Resp: Clear to auscultation bilaterally GI: abdomen is soft, LUQ mildly tender to palpation non-distended, normal bowel sounds, no hepatosplenomegaly MSK: extremities are warm, no edema.  Skin: warm, no rash Neuro:  no focal deficits Psych: appropriate affect   Diagnostic Studies  12/2013 Echo Study Conclusions  - Left ventricle: The cavity size was normal. Wall  thickness was normal. Systolic function was normal. The estimated ejection fraction was in the range of 50% to 55%. There is akinesis of the basal-midinferior myocardium. There is akinesis of the basalinferolateral myocardium. Left ventricular diastolic function parameters were normal. - Aortic valve: Mildly calcified annulus. Trileaflet. - Mitral valve: Mildly thickened leaflets . There was trivial regurgitation. - Right atrium: Central venous pressure (est): 3 mm Hg. - Atrial septum: No defect or patent foramen ovale was identified. - Tricuspid valve: There was trivial regurgitation. - Pulmonary arteries: Systolic pressure could not be accurately estimated. - Pericardium, extracardiac: There was no pericardial effusion.  Impressions:  - Normal LV wall thickness and chamber size with LVEF 50-55%, wall motion abnormalities as outlined above consistent with ischemic heart disease, normal diastolic function. Mildly thickened mitral leaflets with trivial mitral regurgitation. Trivial tricuspid regurgitation, unable to assess PASP.  12/16/13 Cath HEMODYNAMICS: Aortic pressure was 91/60; LV pressure was 91/9; LVEDP 17. There was no gradient between the left ventricle and aorta.  ANGIOGRAPHIC DATA: The left main coronary artery has mild disease. The left anterior descending artery is a large vessel proximally.  In the mid vessel, there is mild, diffuse disease. There are several medium-size diagonals which are widely patent. The left circumflex artery is large vessel. There is mild disease in the proximal portion. The OM1 is a large vessel. There is mild to moderate disease in the mid vessel, up to 40%. The right coronary artery is a large, dominant vessel. In the proximal portion, there is a moderate stenosis of up to 50%. The posterior descending artery is a large vessel and reaches the apex and is widely patent. The posterior lateral artery is occluded  proximally, just past the ostium. After opening the vessel of the balloon, it was noted that the disease extended into the mid posterior lateral artery. The posterior lateral artery was small caliber. LEFT VENTRICULOGRAM: Left ventricular angiogram was done in the 30 RAO projection and revealed mild, basal inferior hypokinesis with normal systolic function with an estimated ejection fraction of 55%. LVEDP was 17 mmHg. PCI NARRATIVE: A JR4 guiding catheter used to engage the RCA. Angiomax was used for anticoagulation. ACT was used to check that the Angiomax was therapeutic. A pro-water wire was placed across the area disease in the posterior lateral artery. An aspiration catheter was advanced to the ostium of the posterior lateral artery. Aspiration thrombectomy was performed with some improvement in flow. A 2.0 x 12 balloon was then used to predilate patient. The vessel was fairly small in caliber. Since the patient admits to prior bleeding issues as well as frequently forgetting to take his medications, we opted for a bare-metal stent. A 2.0 x 23 vision bare metal stent was deployed. The proximalstent was postdilated with a 2.25 by 12 noncompliant balloon. There was an excellent angiographic result of the proximal stent and the bifurcation with the posterior descending artery. There was a stepdown at the distal edge of the stent. TIMI-3 flow was restored. The patient's pain was significantly improved. IMPRESSIONS:  Widely patent left main coronary artery.  Mild disease in the left anterior descending artery and its branches.  Mild disease in the left circumflex artery and its branches.  Moderate disease in the proximal right coronary artery.occluded posterior lateral artery which was the culprit for the patient's presentation. This was successfully treated with a 2.0 x 23 vision bare metal stent, postdilated to 2.25 mm in diameter.  Normal left ventricular systolic function. LVEDP 17 mmHg.  Ejection fraction 55%. RECOMMENDATION: Continue dual antiplatelet therapy for at least 30 days and likely longer. A bare-metal stent was chosen because the patient admits to forgetting to take his medication fairly frequently. He also stated that his wife does not remember to take medicines as well. Continue aggressive secondary prevention. He'll be watched in the ICU. Continue Angiomax at the reduced rate until the bag runs out. He needs to stop smoking. Significant family h/o CAD. Start beta blocker. Start statin. Nicotine patch. Consider f/u in [facility] if that is easier for him to be compliant.   Jan 2016 Lexiscan MPI IMPRESSION: 1. Region of a mixed scar and small area of mild to moderate peri-infarct ischemia noted in the inferior wall as outlined. Cannot exclude a minor region of mid anterior ischemia as well.  2. Normal left ventricular wall motion.  3. Left ventricular ejection fraction 60%  4. Overall low-risk stress test findings*.  08/2014 cath FINAL CONCLUSIONS:  SINGLE VESSEL CAD WITH OCCLUSION OF THE PLA STENT IN THE RIGHT CORONARY, COLLATERALIZED FROM THE LEFT CORONARY ARTERY  DIFFUSE NONOBSTRUCTIVE CAD  MILD SEGMENTAL LV DYSFUNCTION WITH INFERIOR  AKINESIS AND ESTIMATED LVEF 45%  RECOMMENDATIONS:  MEDICAL THERAPY  OK FOR DISCHARGE HOME TODAY   05/2016 cath  Prox RCA lesion, 30 %stenosed.  Post Atrio lesion, 100 %stenosed.  Ost RPDA to RPDA lesion, 75 %stenosed.  Mid Cx to Dist Cx lesion, 100 %stenosed.  Post intervention, there is a 0% residual stenosis.  A stent was successfully placed.  There is moderate to severe left ventricular systolic dysfunction.  LV end diastolic pressure is mildly elevated.  The left ventricular ejection fraction is 35-45% by visual estimate.   Assessment and Plan   1. CAD -restart Atorva 80 and lisinopril 10mg  in setting of CAD and recent STEMI - continue DAPT  2. Abdominal pain - mild to  moderate. Discussed possible CT, patient prefers to monitor at this time  3. HTN - restart lisinopril 10mg  daily  4. Hyperlipidemia - restart statin    Antoine PocheJonathan F. Bandy Honaker, M.D.

## 2016-06-19 NOTE — Patient Instructions (Signed)
Your physician recommends that you schedule a follow-up appointment in: [facility]  Your physician has recommended you make the following change in your medication:   START LISINOPRIL 10 MG DAILY  START LIPITOR 80 MG DAILY  Thank you for choosing [facility]!!

## 2016-06-24 IMAGING — DX DG CHEST 2V
2 series · 2 of 2 positions shown · non-contrast
Comparison: None.

CLINICAL DATA: Pt c/o pain in center of chest with diaphoresis
since this AM. Hx HTN, CAD, smoker, MI with cardiac stent placement
in December 2013

EXAM:
CHEST  2 VIEW

[chest pa]
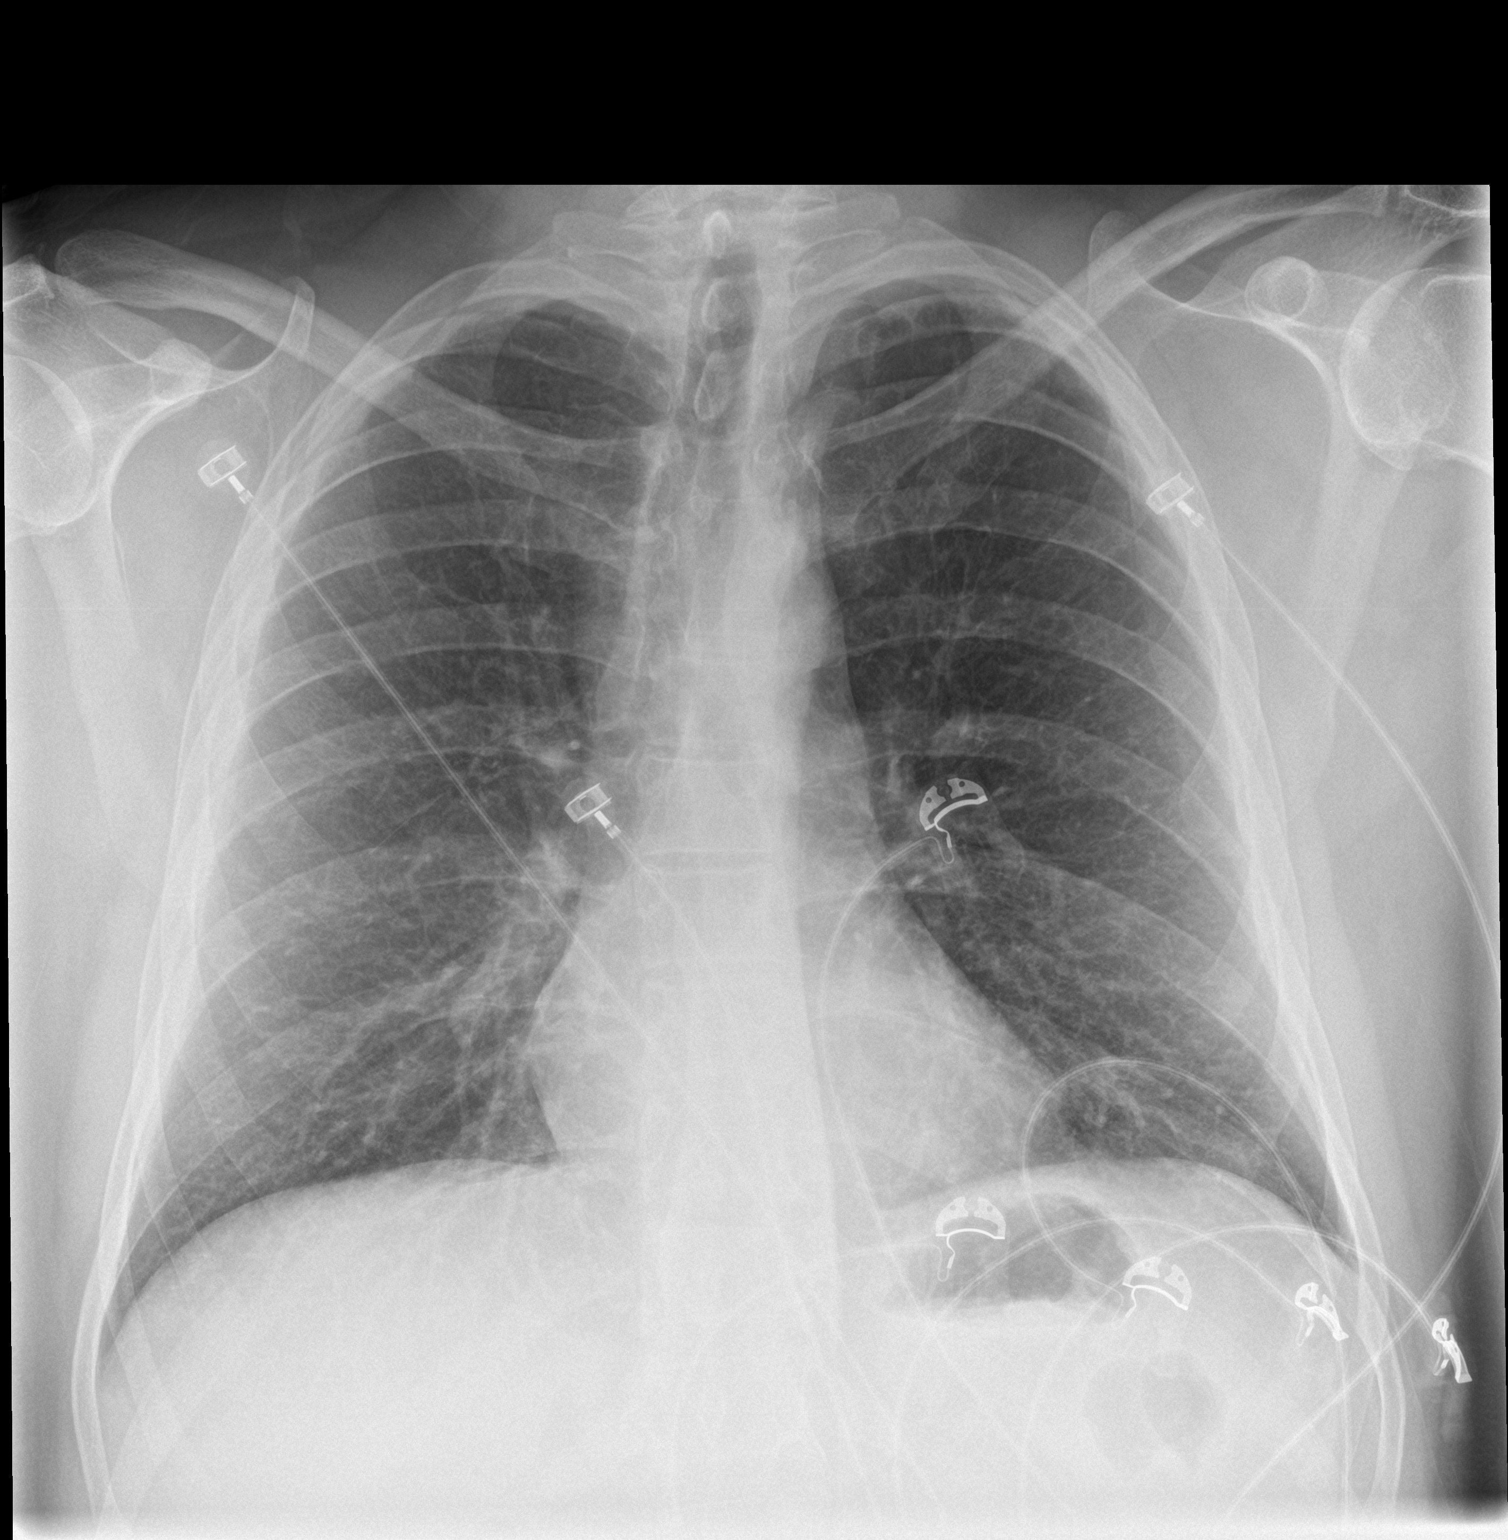

[chest lat]
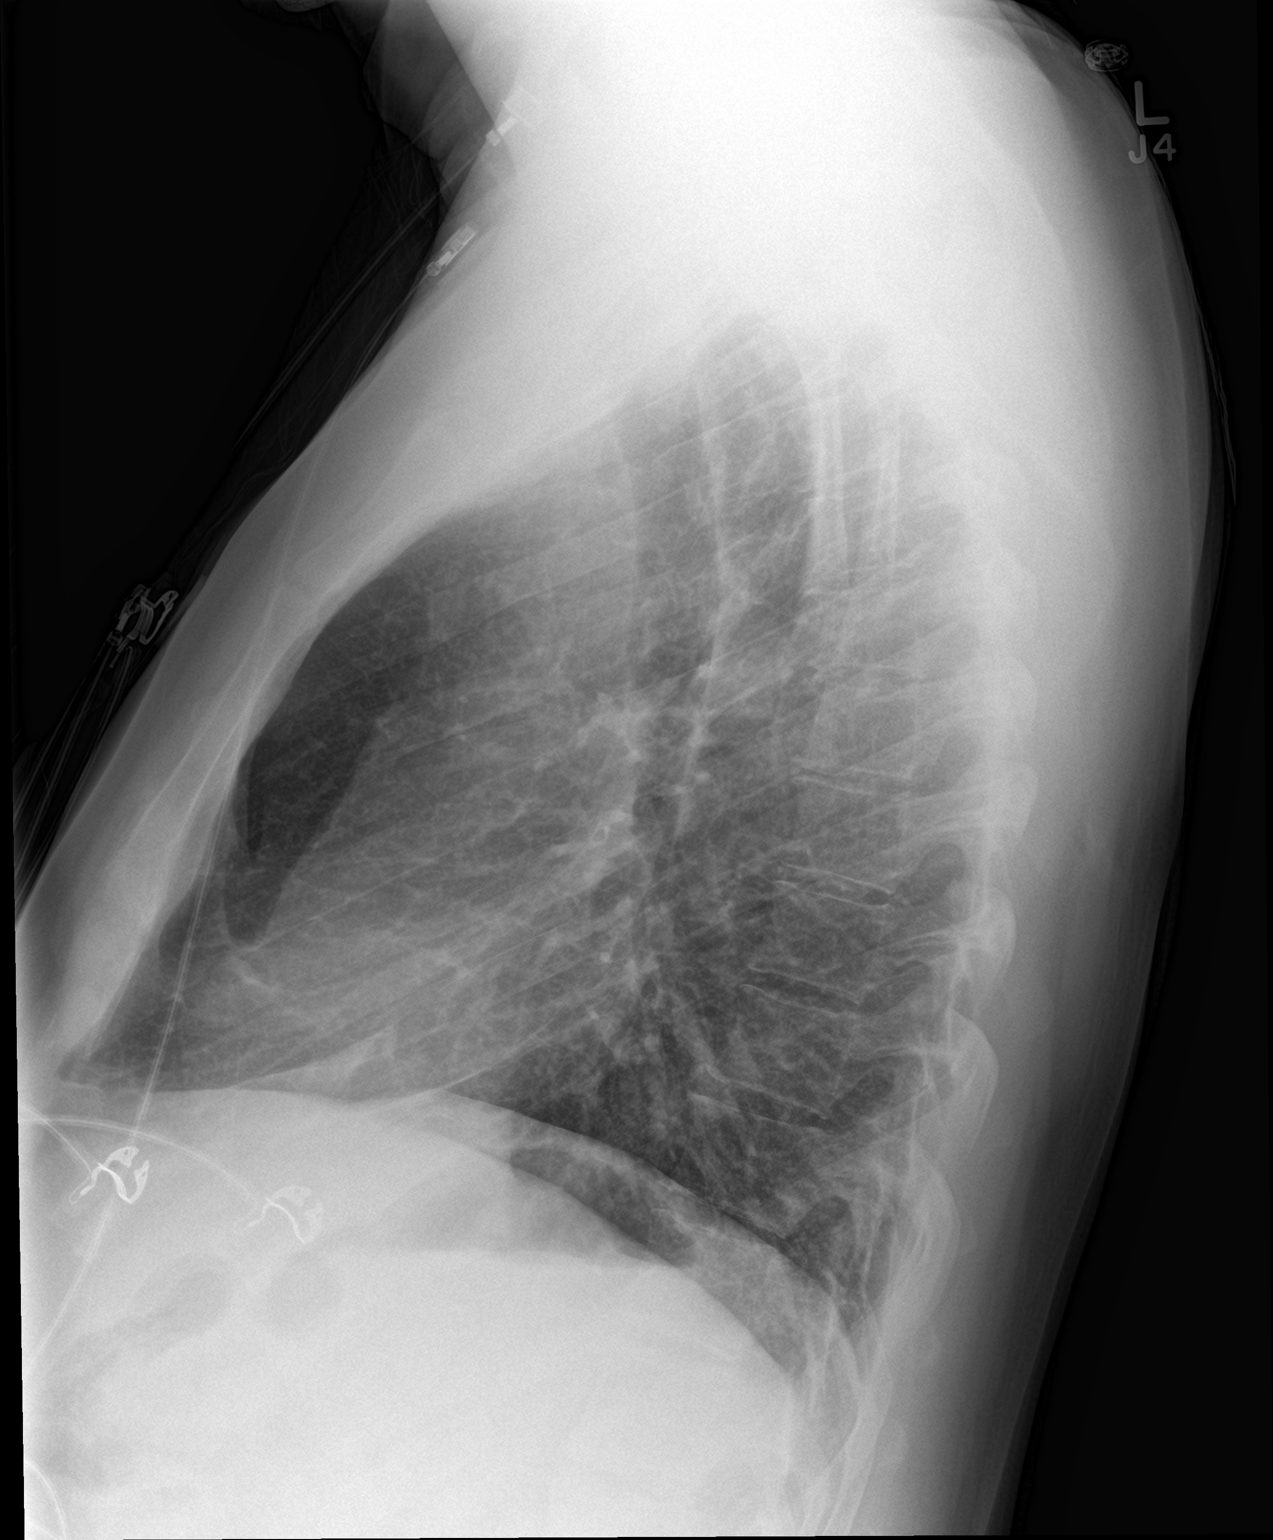

[2 of 2 positions shown; findings below may reference images not displayed]

FINDINGS: Cardiac silhouette normal in size and configuration. Normal
mediastinal and hilar contours.

Minor scarring at the apices. No lung consolidation or edema. No
pleural effusion or pneumothorax.

Bony thorax is intact.
IMPRESSION: No active cardiopulmonary disease.

## 2016-07-30 ENCOUNTER — Emergency Department (HOSPITAL_COMMUNITY): Payer: Medicaid Other

## 2016-07-30 ENCOUNTER — Emergency Department (HOSPITAL_COMMUNITY)
Admission: EM | Admit: 2016-07-30 | Discharge: 2016-07-30 | Disposition: A | Discharge status: Home / Self Care | Payer: Medicaid Other | Attending: Emergency Medicine | Admitting: Emergency Medicine

## 2016-07-30 ENCOUNTER — Encounter (HOSPITAL_COMMUNITY): Payer: Self-pay | Admitting: Emergency Medicine

## 2016-07-30 DIAGNOSIS — R0602 Shortness of breath: Secondary | ICD-10-CM | POA: Insufficient documentation

## 2016-07-30 DIAGNOSIS — R42 Dizziness and giddiness: Secondary | ICD-10-CM | POA: Diagnosis not present

## 2016-07-30 DIAGNOSIS — Z5321 Procedure and treatment not carried out due to patient leaving prior to being seen by health care provider: Secondary | ICD-10-CM | POA: Insufficient documentation

## 2016-07-30 DIAGNOSIS — R072 Precordial pain: Secondary | ICD-10-CM | POA: Insufficient documentation

## 2016-07-30 NOTE — ED Triage Notes (Signed)
Patient c/o mid-sternal, non-radiating chest pain. Per patient shortness of breath, nausea, diaphoretic, dry heaving, and dizziness. Per patient chest pain started 3 days ago and has progressively getting worse. Per patient hx of MI x2 with 3 stents.

## 2016-07-30 NOTE — ED Notes (Signed)
This RN was told by NT, Lauren that lab had called pt earlier to draw his blood and he was not in the waiting area.

## 2016-07-30 NOTE — ED Notes (Signed)
Called to triage x 2 with no answer  

## 2016-09-28 ENCOUNTER — Ambulatory Visit (INDEPENDENT_AMBULATORY_CARE_PROVIDER_SITE_OTHER): Payer: Medicaid Other | Admitting: Cardiology

## 2016-09-28 ENCOUNTER — Encounter: Payer: Self-pay | Admitting: Cardiology

## 2016-09-28 VITALS — BP 142/100 | HR 94 | Ht 67.0 in | Wt 174.4 lb

## 2016-09-28 DIAGNOSIS — E782 Mixed hyperlipidemia: Secondary | ICD-10-CM | POA: Diagnosis not present

## 2016-09-28 DIAGNOSIS — I2510 Atherosclerotic heart disease of native coronary artery without angina pectoris: Secondary | ICD-10-CM | POA: Diagnosis not present

## 2016-09-28 DIAGNOSIS — I10 Essential (primary) hypertension: Secondary | ICD-10-CM

## 2016-09-28 MED ORDER — METOPROLOL SUCCINATE ER 50 MG PO TB24: 50.0000 mg | ORAL_TABLET | Freq: Every day | ORAL | 1 refills | Status: DC

## 2016-09-28 NOTE — Progress Notes (Signed)
Clinical Summary Don Fletcher is a 46 y.o.male seen today for follow up of the following medical problems.   1. CAD - pt admitted 12/16/13 with inferolateral STEMI, received BMS to proximal RPL. Full cath report below. LVEF 55% by LVgram, LVEF 50-55% by echo.  - on DAPT with ASA and effient, initially on brillinta but had some SOB and was changed while in the hospital  - Lexiscan MPI after cath in setting of chest pain Jan 2016 with inferior scar with mild to moderate ischemia, LVEF 60%. Overall low risk. Managed medically - admit 07/2014 with chest pain, cath showed occluded in the PLA with left to right collateralization, managed medically.  -Admit 05/2016 with STEMI, received DES to LCX. - patient left AMA shortly after procedure - denies any chest pain. No SOB or DOE   - no recent chest pain. No SOB or DOE - compliant with meds, though has a hard time taking his AM dose of coreg.   2. COPD - followed by Don Fletcher.   3. Depression - follow by psychiatry   Past Medical History:  Diagnosis Date  . Bipolar 1 disorder (HCC)   . CAD (coronary artery disease)    a. 12/16/13 inferolat STEMI s/p BMS to RPL. b. Cath 08/2014: interim occlusion of the PLA stent of the RCA, collateralized from the left coronary artery with diffuse nonobstructive CAD;  c. STEMI/PCI: LM nl, LAD nl, LCX 133m/d (2.75x12 synergy DES), RCA 30p, RPDA 75ost/p, RPAV 100 w/ L->R collats.  . Chronic back pain   . HLD (hyperlipidemia)   . Hypertension   . Ischemic cardiomyopathy    a. 12/2013: EF 50-55% by echo with +WMA. b. EF 45% by cath in 08/2014.  . Ischemic cardiomyopathy 05/19/2016  . Peptic ulcer disease   . Schizophrenic disorder (HCC)   . Tobacco abuse      Allergies  Allergen Reactions  . Suboxone [Buprenorphine Hcl-Naloxone Hcl]     Caused kidney failure  . Benadryl [Diphenhydramine] Other (See Comments)    LEG CRAMPING  . Seroquel [Quetiapine] Other (See Comments)    "makes my legs  cramp"  . Tylenol Pm Extra [Diphenhydramine-Apap (Sleep)] Other (See Comments)    "makes my leg cramp"     Current Outpatient Prescriptions  Medication Sig Dispense Refill  . aspirin EC 81 MG tablet Take 81 mg by mouth daily.    [name] atorvastatin (LIPITOR) 80 MG tablet Take 1 tablet (80 mg total) by mouth daily. 90 tablet 1  . carvedilol (COREG) 6.25 MG tablet Take 6.25 mg by mouth 2 (two) times daily with a meal.    . clopidogrel (PLAVIX) 75 MG tablet Take 1 tablet (75 mg total) by mouth daily. 90 tablet 3  . lisinopril (PRINIVIL,ZESTRIL) 10 MG tablet Take 1 tablet (10 mg total) by mouth daily. 90 tablet 1   No current facility-administered medications for this visit.      Past Surgical History:  Procedure Laterality Date  . CARDIAC CATHETERIZATION N/A 08/07/2014   Procedure: Left Heart Cath and Coronary Angiography;  Surgeon: Don Bollman, MD;  Location: [facility] INVASIVE CV LAB;  Service: Cardiovascular;  Laterality: N/A;  . CORONARY STENT INTERVENTION N/A 05/19/2016   Procedure: Coronary Stent Intervention;  Surgeon: Don Gess, MD;  Location: [facility] INVASIVE CV LAB;  Service: Cardiovascular;  Laterality: N/A;  . HERNIA REPAIR    . LEFT HEART CATH AND CORONARY ANGIOGRAPHY N/A 05/19/2016   Procedure: Left Heart Cath and Coronary Angiography;  Surgeon: Don Gess, MD;  Location: [facility] INVASIVE CV LAB;  Service: Cardiovascular;  Laterality: N/A;  . LEFT HEART CATHETERIZATION WITH CORONARY ANGIOGRAM N/A 12/16/2013   Procedure: LEFT HEART CATHETERIZATION WITH CORONARY ANGIOGRAM;  Surgeon: Don Crafts, MD;  Location: [facility] CATH LAB;  Service: Cardiovascular;  Laterality: N/A;  . PERCUTANEOUS CORONARY STENT INTERVENTION (PCI-S)  12/16/2013   Procedure: PERCUTANEOUS CORONARY STENT INTERVENTION (PCI-S);  Surgeon: Don Crafts, MD;  Location: [facility] CATH LAB;  Service: Cardiovascular;;  distal rca     Allergies  Allergen Reactions  . Suboxone [Buprenorphine Hcl-Naloxone Hcl]      Caused kidney failure  . Benadryl [Diphenhydramine] Other (See Comments)    LEG CRAMPING  . Seroquel [Quetiapine] Other (See Comments)    "makes my legs cramp"  . Tylenol Pm Extra [Diphenhydramine-Apap (Sleep)] Other (See Comments)    "makes my leg cramp"      Family History  Problem Relation Age of Onset  . Heart attack Father 91  . Heart attack Brother 55     Social History [name] reports that he has been smoking Cigarettes.  He started smoking about 28 years ago. He has a 25.00 pack-year smoking history. He quit smokeless tobacco use about 28 years ago. His smokeless tobacco use included Snuff. [name] reports that he does not drink alcohol.   Review of Systems CONSTITUTIONAL: No weight loss, fever, chills, weakness or fatigue.  HEENT: Eyes: No visual loss, blurred vision, double vision or yellow sclerae.No hearing loss, sneezing, congestion, runny nose or sore throat.  SKIN: No rash or itching.  CARDIOVASCULAR: per hpi RESPIRATORY: per hpi GASTROINTESTINAL: No anorexia, nausea, vomiting or diarrhea. No abdominal pain or blood.  GENITOURINARY: No burning on urination, no polyuria NEUROLOGICAL: No headache, dizziness, syncope, paralysis, ataxia, numbness or tingling in the extremities. No change in bowel or bladder control.  MUSCULOSKELETAL: No muscle, back pain, joint pain or stiffness.  LYMPHATICS: No enlarged nodes. No history of splenectomy.  PSYCHIATRIC: No history of depression or anxiety.  ENDOCRINOLOGIC: No reports of sweating, cold or heat intolerance. No polyuria or polydipsia.  [name]   Physical Examination Vitals:   09/28/16 1450  BP: (!) 142/100  Pulse: 94  SpO2: 96%   Vitals:   09/28/16 1450  Weight: 174 lb 6.4 oz (79.1 kg)  Height: 5\' 7"  (1.702 m)    Gen: resting comfortably, no acute distress HEENT: no scleral icterus, pupils equal round and reactive, no palptable cervical adenopathy,  CV: RRR, no m/r/g, no jvd Resp: Clear to auscultation  bilaterally GI: abdomen is soft, non-tender, non-distended, normal bowel sounds, no hepatosplenomegaly MSK: extremities are warm, no edema.  Skin: warm, no rash Neuro:  no focal deficits Psych: appropriate affect   Diagnostic Studies 12/2013 Echo Study Conclusions  - Left ventricle: The cavity size was normal. Wall thickness was normal. Systolic function was normal. The estimated ejection fraction was in the range of 50% to 55%. There is akinesis of the basal-midinferior myocardium. There is akinesis of the basalinferolateral myocardium. Left ventricular diastolic function parameters were normal. - Aortic valve: Mildly calcified annulus. Trileaflet. - Mitral valve: Mildly thickened leaflets . There was trivial regurgitation. - Right atrium: Central venous pressure (est): 3 mm Hg. - Atrial septum: No defect or patent foramen ovale was identified. - Tricuspid valve: There was trivial regurgitation. - Pulmonary arteries: Systolic pressure could not be accurately estimated. - Pericardium, extracardiac: There was no pericardial effusion.  Impressions:  - Normal LV wall thickness and chamber  size with LVEF 50-55%, wall motion abnormalities as outlined above consistent with ischemic heart disease, normal diastolic function. Mildly thickened mitral leaflets with trivial mitral regurgitation. Trivial tricuspid regurgitation, unable to assess PASP.  12/16/13 Cath HEMODYNAMICS: Aortic pressure was 91/60; LV pressure was 91/9; LVEDP 17. There was no gradient between the left ventricle and aorta.  ANGIOGRAPHIC DATA: The left main coronary artery has mild disease. The left anterior descending artery is a large vessel proximally. In the mid vessel, there is mild, diffuse disease. There are several medium-size diagonals which are widely patent. The left circumflex artery is large vessel. There is mild disease in the proximal portion. The OM1 is a large vessel.  There is mild to moderate disease in the mid vessel, up to 40%. The right coronary artery is a large, dominant vessel. In the proximal portion, there is a moderate stenosis of up to 50%. The posterior descending artery is a large vessel and reaches the apex and is widely patent. The posterior lateral artery is occluded proximally, just past the ostium. After opening the vessel of the balloon, it was noted that the disease extended into the mid posterior lateral artery. The posterior lateral artery was small caliber. LEFT VENTRICULOGRAM: Left ventricular angiogram was done in the 30 RAO projection and revealed mild, basal inferior hypokinesis with normal systolic function with an estimated ejection fraction of 55%. LVEDP was 17 mmHg. PCI NARRATIVE: A JR4 guiding catheter used to engage the RCA. Angiomax was used for anticoagulation. ACT was used to check that the Angiomax was therapeutic. A pro-water wire was placed across the area disease in the posterior lateral artery. An aspiration catheter was advanced to the ostium of the posterior lateral artery. Aspiration thrombectomy was performed with some improvement in flow. A 2.0 x 12 balloon was then used to predilate patient. The vessel was fairly small in caliber. Since the patient admits to prior bleeding issues as well as frequently forgetting to take his medications, we opted for a bare-metal stent. A 2.0 x 23 vision bare metal stent was deployed. The proximalstent was postdilated with a 2.25 by 12 noncompliant balloon. There was an excellent angiographic result of the proximal stent and the bifurcation with the posterior descending artery. There was a stepdown at the distal edge of the stent. TIMI-3 flow was restored. The patient's pain was significantly improved. IMPRESSIONS:  Widely patent left main coronary artery.  Mild disease in the left anterior descending artery and its branches.  Mild disease in the left circumflex artery and its  branches.  Moderate disease in the proximal right coronary artery.occluded posterior lateral artery which was the culprit for the patient's presentation. This was successfully treated with a 2.0 x 23 vision bare metal stent, postdilated to 2.25 mm in diameter.  Normal left ventricular systolic function. LVEDP 17 mmHg. Ejection fraction 55%. RECOMMENDATION: Continue dual antiplatelet therapy for at least 30 days and likely longer. A bare-metal stent was chosen because the patient admits to forgetting to take his medication fairly frequently. He also stated that his wife does not remember to take medicines as well. Continue aggressive secondary prevention. He'll be watched in the ICU. Continue Angiomax at the reduced rate until the bag runs out. He needs to stop smoking. Significant family h/o CAD. Start beta blocker. Start statin. Nicotine patch. Consider f/u in [facility] if that is easier for him to be compliant.   Jan 2016 Lexiscan MPI IMPRESSION: 1. Region of a mixed scar and small area of mild to  moderate peri-infarct ischemia noted in the inferior wall as outlined. Cannot exclude a minor region of mid anterior ischemia as well.  2. Normal left ventricular wall motion.  3. Left ventricular ejection fraction 60%  4. Overall low-risk stress test findings*.  08/2014 cath FINAL CONCLUSIONS:  SINGLE VESSEL CAD WITH OCCLUSION OF THE PLA STENT IN THE RIGHT CORONARY, COLLATERALIZED FROM THE LEFT CORONARY ARTERY  DIFFUSE NONOBSTRUCTIVE CAD  MILD SEGMENTAL LV DYSFUNCTION WITH INFERIOR AKINESIS AND ESTIMATED LVEF 45%  RECOMMENDATIONS:  MEDICAL THERAPY  OK FOR DISCHARGE HOME TODAY   05/2016 cath  Prox RCA lesion, 30 %stenosed.  Post Atrio lesion, 100 %stenosed.  Ost RPDA to RPDA lesion, 75 %stenosed.  Mid Cx to Dist Cx lesion, 100 %stenosed.  Post intervention, there is a 0% residual stenosis.  A stent was successfully placed.  There is moderate to  severe left ventricular systolic dysfunction.  LV end diastolic pressure is mildly elevated.  The left ventricular ejection fraction is 35-45% by visual estimate.     Assessment and Plan   1. CAD - continue DAPT for at least 1 year s/p STEMI - difficulty taking bid beta blocker, we will change him to TOprol XL 50mg  dialy, stop coreg  2. HTN - elevated in clnic however has not taken meds yet today, continue to monitor  3. Hyperlipidemia -continue staitn     Antoine Poche, M.D.,

## 2016-09-28 NOTE — Patient Instructions (Signed)
Medication Instructions:  Your physician has recommended you make the following change in your medication:  Stop Coreg Begin Toprol xl 50 mg daily Please continue all other medications as prescribed  Labwork: none  Testing/Procedures: none  Follow-Up: Your physician wants you to follow-up in: 6 MONTHS WITH DR. BRANCH. You will receive a reminder letter in the mail two months in advance. If you don't receive a letter, please call our office to schedule the follow-up appointment.   Any Other Special Instructions Will Be Listed Below (If Applicable).  If you need a refill on your cardiac medications before your next appointment, please call your pharmacy.

## 2017-02-14 ENCOUNTER — Telehealth (INDEPENDENT_AMBULATORY_CARE_PROVIDER_SITE_OTHER): Payer: Self-pay | Admitting: *Deleted

## 2017-02-14 ENCOUNTER — Ambulatory Visit (INDEPENDENT_AMBULATORY_CARE_PROVIDER_SITE_OTHER): Payer: Medicaid Other | Admitting: Internal Medicine

## 2017-02-14 ENCOUNTER — Encounter (INDEPENDENT_AMBULATORY_CARE_PROVIDER_SITE_OTHER): Payer: Self-pay

## 2017-02-14 ENCOUNTER — Encounter (INDEPENDENT_AMBULATORY_CARE_PROVIDER_SITE_OTHER): Payer: Self-pay | Admitting: *Deleted

## 2017-02-14 ENCOUNTER — Encounter (INDEPENDENT_AMBULATORY_CARE_PROVIDER_SITE_OTHER): Payer: Self-pay | Admitting: Internal Medicine

## 2017-02-14 VITALS — BP 140/90 | HR 80 | Temp 98.0°F | Ht 66.0 in | Wt 175.5 lb

## 2017-02-14 DIAGNOSIS — Z800 Family history of malignant neoplasm of digestive organs: Secondary | ICD-10-CM | POA: Diagnosis not present

## 2017-02-14 MED ORDER — PEG-KCL-NACL-NASULF-NA ASC-C 140 G PO SOLR: 1.0000 | Freq: Once | ORAL | 0 refills | Status: AC

## 2017-02-14 NOTE — Progress Notes (Deleted)
   Subjective:    Patient ID: Don CarnesSteven D Fletcher, male    DOB: Aug 27, 1970, 47 y.o.   MRN: [id number]  HPI Referred by Dr. Felecia ShellingFanta for diarrhea. States about 2 weeks ago he became constiipated. He had a large,hard stool and then he began to have diarrhea.     Review of Systems     Objective:   Physical Exam        Assessment & Plan:

## 2017-02-14 NOTE — Telephone Encounter (Signed)
Patient is scheduled for colonoscopy 03/09/17 -- he needs to stop ASA 2 days & Plavix 5 days before -- please advise if ok

## 2017-02-14 NOTE — Progress Notes (Deleted)
Patient ID: Don CarnesSteven D Fletcher, male   DOB: [birth year], 47 y.o.   MRN: [id number]

## 2017-02-14 NOTE — Telephone Encounter (Signed)
Patient needs plenvu 

## 2017-02-14 NOTE — Patient Instructions (Signed)
Colonoscopy. The risks of bleeding, perforation and infection were reviewed with patient.  

## 2017-02-14 NOTE — Progress Notes (Signed)
Subjective:    Patient ID: Don Fletcher, male    DOB: August 23, 1970, 47 y.o.   MRN: [id number]  HPI Referred by Dr. Felecia Shelling for constipation/diarrhea. Apparently patient became constipated about 2 weeks. Had not had a BM in several days. States he had to strain to have the BM. After having the BM, he had diarrhea for several hours.  He says now his stools are formed. No problems with defecation. Appetite has remained good. No weight loss. There is a family hx of colon cancer in a brother (71s). and a mother. Father had lung cancer.      Review of Systems Past Medical History:  Diagnosis Date  . Bipolar 1 disorder (HCC)   . CAD (coronary artery disease)    a. 12/16/13 inferolat STEMI s/p BMS to RPL. b. Cath 08/2014: interim occlusion of the PLA stent of the RCA, collateralized from the left coronary artery with diffuse nonobstructive CAD;  c. STEMI/PCI: LM nl, LAD nl, LCX 148m/d (2.75x12 synergy DES), RCA 30p, RPDA 75ost/p, RPAV 100 w/ L->R collats.  . Chronic back pain   . HLD (hyperlipidemia)   . Hypertension   . Ischemic cardiomyopathy    a. 12/2013: EF 50-55% by echo with +WMA. b. EF 45% by cath in 08/2014.  . Ischemic cardiomyopathy 05/19/2016  . Peptic ulcer disease   . Schizophrenic disorder (HCC)   . Tobacco abuse     Past Surgical History:  Procedure Laterality Date  . CARDIAC CATHETERIZATION N/A 08/07/2014   Procedure: Left Heart Cath and Coronary Angiography;  Surgeon: Tonny Bollman, MD;  Location: [facility] INVASIVE CV LAB;  Service: Cardiovascular;  Laterality: N/A;  . CORONARY STENT INTERVENTION N/A 05/19/2016   Procedure: Coronary Stent Intervention;  Surgeon: Runell Gess, MD;  Location: [facility] INVASIVE CV LAB;  Service: Cardiovascular;  Laterality: N/A;  . HERNIA REPAIR    . LEFT HEART CATH AND CORONARY ANGIOGRAPHY N/A 05/19/2016   Procedure: Left Heart Cath and Coronary Angiography;  Surgeon: Runell Gess, MD;  Location: [facility] INVASIVE CV LAB;  Service: Cardiovascular;   Laterality: N/A;  . LEFT HEART CATHETERIZATION WITH CORONARY ANGIOGRAM N/A 12/16/2013   Procedure: LEFT HEART CATHETERIZATION WITH CORONARY ANGIOGRAM;  Surgeon: Corky Crafts, MD;  Location: [facility] CATH LAB;  Service: Cardiovascular;  Laterality: N/A;  . PERCUTANEOUS CORONARY STENT INTERVENTION (PCI-S)  12/16/2013   Procedure: PERCUTANEOUS CORONARY STENT INTERVENTION (PCI-S);  Surgeon: Corky Crafts, MD;  Location: [facility] CATH LAB;  Service: Cardiovascular;;  distal rca    Allergies  Allergen Reactions  . Suboxone [Buprenorphine Hcl-Naloxone Hcl]     Caused kidney failure  . Benadryl [Diphenhydramine] Other (See Comments)    LEG CRAMPING  . Seroquel [Quetiapine] Other (See Comments)    "makes my legs cramp"  . Tylenol Pm Extra [Diphenhydramine-Apap (Sleep)] Other (See Comments)    "makes my leg cramp"    Current Outpatient Medications on File Prior to Visit  Medication Sig Dispense Refill  . aspirin EC 81 MG tablet Take 81 mg by mouth daily.    [name] atorvastatin (LIPITOR) 80 MG tablet Take 1 tablet (80 mg total) by mouth daily. 90 tablet 1  . clopidogrel (PLAVIX) 75 MG tablet Take 1 tablet (75 mg total) by mouth daily. 90 tablet 3  . lisinopril (PRINIVIL,ZESTRIL) 10 MG tablet Take 1 tablet (10 mg total) by mouth daily. 90 tablet 1  . metoprolol succinate (TOPROL-XL) 50 MG 24 hr tablet Take 50 mg by mouth daily. Take with or immediately  following a meal.    . metoprolol succinate (TOPROL-XL) 50 MG 24 hr tablet Take 1 tablet (50 mg total) by mouth daily. Take with or immediately following a meal. 90 tablet 1   No current facility-administered medications on file prior to visit.         Objective:   Physical Exam Blood pressure 140/90, pulse 80, temperature 98 F (36.7 C), height 5\' 6"  (1.676 m), weight 175 lb 8 oz (79.6 kg). Alert and oriented. Skin warm and dry. Oral mucosa is moist.   . Sclera anicteric, conjunctivae is pink. Thyroid not enlarged. No cervical lymphadenopathy.  Lungs clear. Heart regular rate and rhythm.  Abdomen is soft. Bowel sounds are positive. No hepatomegaly. No abdominal masses felt. No tenderness.  No edema to lower extremities.           Assessment & Plan:  Constipation.Try stool softener as needed. Family hx of colon cancer. Colonoscopy. The risks of bleeding, perforation and infection were reviewed with patient.

## 2017-02-15 NOTE — Telephone Encounter (Signed)
How urgent is the colonoscopy? Last stent 05/2016, has not been quite a year on asiprin and plavix. Would be best to hold off until 05/2017 for procedure if possible, if urgent then there is data it is reasonably safe to hold at this time if we have to   Dominga FerryJ Deray Dawes MD

## 2017-02-15 NOTE — Telephone Encounter (Signed)
This is not urgent.

## 2017-02-15 NOTE — Telephone Encounter (Signed)
Terri, please advise Dr Wyline MoodBranch on how urgent colonoscopy is, so he can make determination about Plavix & ASA

## 2017-02-16 NOTE — Telephone Encounter (Signed)
Left message for patient to give me a call to reschedule out at least end of April ot 1st part of May

## 2017-02-20 ENCOUNTER — Encounter (INDEPENDENT_AMBULATORY_CARE_PROVIDER_SITE_OTHER): Payer: Self-pay | Admitting: *Deleted

## 2017-02-20 NOTE — Telephone Encounter (Signed)
TCS resch'd to 06/06/17, patient aware

## 2017-05-16 ENCOUNTER — Telehealth (INDEPENDENT_AMBULATORY_CARE_PROVIDER_SITE_OTHER): Payer: Self-pay | Admitting: *Deleted

## 2017-05-16 NOTE — Telephone Encounter (Signed)
Forwarded to Dr. Branch.

## 2017-05-16 NOTE — Telephone Encounter (Signed)
Pt scheduled 5/28 - made aware that he could hold plavix and ASA as needed

## 2017-05-16 NOTE — Telephone Encounter (Signed)
Ok to hold plavix and aspirin as needed. Staci this patient is overdue for f/u with me can we schedule him   J Rudi Knippenberg MD

## 2017-05-16 NOTE — Telephone Encounter (Signed)
Patient is scheduled for colonoscopy 06/06/17 -- needs to stop Plavix 5 days & ASA 2 days prior -- please advise if ok to stop

## 2017-06-06 ENCOUNTER — Other Ambulatory Visit: Payer: Self-pay

## 2017-06-06 ENCOUNTER — Encounter (HOSPITAL_COMMUNITY)
Admission: RE | Disposition: A | Discharge status: Home / Self Care | Payer: Self-pay | Source: Ambulatory Visit | Attending: Internal Medicine

## 2017-06-06 ENCOUNTER — Ambulatory Visit (HOSPITAL_COMMUNITY)
Admission: RE | Admit: 2017-06-06 | Discharge: 2017-06-06 | Disposition: A | Discharge status: Home / Self Care | Payer: Medicaid Other | Source: Ambulatory Visit | Attending: Internal Medicine | Admitting: Internal Medicine

## 2017-06-06 ENCOUNTER — Encounter (HOSPITAL_COMMUNITY): Payer: Self-pay

## 2017-06-06 DIAGNOSIS — Z800 Family history of malignant neoplasm of digestive organs: Secondary | ICD-10-CM | POA: Diagnosis not present

## 2017-06-06 DIAGNOSIS — G8929 Other chronic pain: Secondary | ICD-10-CM | POA: Insufficient documentation

## 2017-06-06 DIAGNOSIS — Z79899 Other long term (current) drug therapy: Secondary | ICD-10-CM | POA: Insufficient documentation

## 2017-06-06 DIAGNOSIS — K644 Residual hemorrhoidal skin tags: Secondary | ICD-10-CM | POA: Diagnosis not present

## 2017-06-06 DIAGNOSIS — I2510 Atherosclerotic heart disease of native coronary artery without angina pectoris: Secondary | ICD-10-CM | POA: Insufficient documentation

## 2017-06-06 DIAGNOSIS — F319 Bipolar disorder, unspecified: Secondary | ICD-10-CM | POA: Insufficient documentation

## 2017-06-06 DIAGNOSIS — Z7902 Long term (current) use of antithrombotics/antiplatelets: Secondary | ICD-10-CM | POA: Diagnosis not present

## 2017-06-06 DIAGNOSIS — Z886 Allergy status to analgesic agent status: Secondary | ICD-10-CM | POA: Diagnosis not present

## 2017-06-06 DIAGNOSIS — Z888 Allergy status to other drugs, medicaments and biological substances status: Secondary | ICD-10-CM | POA: Insufficient documentation

## 2017-06-06 DIAGNOSIS — Z8711 Personal history of peptic ulcer disease: Secondary | ICD-10-CM | POA: Insufficient documentation

## 2017-06-06 DIAGNOSIS — I255 Ischemic cardiomyopathy: Secondary | ICD-10-CM | POA: Diagnosis not present

## 2017-06-06 DIAGNOSIS — F17210 Nicotine dependence, cigarettes, uncomplicated: Secondary | ICD-10-CM | POA: Diagnosis not present

## 2017-06-06 DIAGNOSIS — E785 Hyperlipidemia, unspecified: Secondary | ICD-10-CM | POA: Insufficient documentation

## 2017-06-06 DIAGNOSIS — F209 Schizophrenia, unspecified: Secondary | ICD-10-CM | POA: Diagnosis not present

## 2017-06-06 DIAGNOSIS — Z7982 Long term (current) use of aspirin: Secondary | ICD-10-CM | POA: Insufficient documentation

## 2017-06-06 DIAGNOSIS — K648 Other hemorrhoids: Secondary | ICD-10-CM | POA: Diagnosis not present

## 2017-06-06 DIAGNOSIS — Z955 Presence of coronary angioplasty implant and graft: Secondary | ICD-10-CM | POA: Diagnosis not present

## 2017-06-06 DIAGNOSIS — I10 Essential (primary) hypertension: Secondary | ICD-10-CM | POA: Insufficient documentation

## 2017-06-06 DIAGNOSIS — Z8249 Family history of ischemic heart disease and other diseases of the circulatory system: Secondary | ICD-10-CM | POA: Insufficient documentation

## 2017-06-06 DIAGNOSIS — M549 Dorsalgia, unspecified: Secondary | ICD-10-CM | POA: Insufficient documentation

## 2017-06-06 DIAGNOSIS — Z1211 Encounter for screening for malignant neoplasm of colon: Secondary | ICD-10-CM

## 2017-06-06 DIAGNOSIS — I252 Old myocardial infarction: Secondary | ICD-10-CM | POA: Insufficient documentation

## 2017-06-06 HISTORY — PX: COLONOSCOPY: SHX5424

## 2017-06-06 SURGERY — COLONOSCOPY
Anesthesia: Moderate Sedation

## 2017-06-06 MED ORDER — MEPERIDINE HCL 50 MG/ML IJ SOLN
INTRAMUSCULAR | Status: DC | PRN
  Administered 2017-06-06 (×4): 25 mg via INTRAVENOUS

## 2017-06-06 MED ORDER — MIDAZOLAM HCL 5 MG/5ML IJ SOLN
INTRAMUSCULAR | Status: AC
  Filled 2017-06-06: qty 5

## 2017-06-06 MED ORDER — MIDAZOLAM HCL 5 MG/5ML IJ SOLN
INTRAMUSCULAR | Status: AC
  Filled 2017-06-06: qty 10

## 2017-06-06 MED ORDER — MEPERIDINE HCL 50 MG/ML IJ SOLN
INTRAMUSCULAR | Status: AC
  Filled 2017-06-06: qty 1

## 2017-06-06 MED ORDER — MIDAZOLAM HCL 5 MG/5ML IJ SOLN
INTRAMUSCULAR | Status: DC | PRN
  Administered 2017-06-06 (×2): 3 mg via INTRAVENOUS
  Administered 2017-06-06 (×2): 2 mg via INTRAVENOUS
  Administered 2017-06-06: 1 mg via INTRAVENOUS
  Administered 2017-06-06 (×2): 2 mg via INTRAVENOUS

## 2017-06-06 MED ORDER — STERILE WATER FOR IRRIGATION IR SOLN
Status: DC | PRN
  Administered 2017-06-06: 15 mL

## 2017-06-06 MED ORDER — SODIUM CHLORIDE 0.9 % IV SOLN
INTRAVENOUS | Status: DC
  Administered 2017-06-06: 10:00:00 via INTRAVENOUS

## 2017-06-06 NOTE — H&P (Signed)
Don Fletcher is an 47 y.o. male.   Chief Complaint: Patient is here for colonoscopy. HPI: Patient is a 47 year old Caucasian male who is here for high risk screening colonoscopy.  His mother had surgery for colon carcinoma at age 47.  She is doing fine 3 years later.  His brother age 91 was recently diagnosed with rectal adenocarcinoma and finished chemoradiation therapy.  Patient has irregular bowel movements.  He is felt to have IBS.  He denies abdominal pain or rectal bleeding.  He has been off Aspirin and clopidogrel for 1 week.  Past Medical History:  Diagnosis Date  . Bipolar 1 disorder (HCC)   . CAD (coronary artery disease)    a. 12/16/13 inferolat STEMI s/p BMS to RPL. b. Cath 08/2014: interim occlusion of the PLA stent of the RCA, collateralized from the left coronary artery with diffuse nonobstructive CAD;  c. STEMI/PCI: LM nl, LAD nl, LCX 138m/d (2.75x12 synergy DES), RCA 30p, RPDA 75ost/p, RPAV 100 w/ L->R collats.  . Chronic back pain   . HLD (hyperlipidemia)   . Hypertension   . Ischemic cardiomyopathy    a. 12/2013: EF 50-55% by echo with +WMA. b. EF 45% by cath in 08/2014.  . Ischemic cardiomyopathy 05/19/2016  . Peptic ulcer disease   . Schizophrenic disorder (HCC)   . Tobacco abuse     Past Surgical History:  Procedure Laterality Date  . CARDIAC CATHETERIZATION N/A 08/07/2014   Procedure: Left Heart Cath and Coronary Angiography;  Surgeon: Tonny Bollman, MD;  Location: [facility] INVASIVE CV LAB;  Service: Cardiovascular;  Laterality: N/A;  . CORONARY STENT INTERVENTION N/A 05/19/2016   Procedure: Coronary Stent Intervention;  Surgeon: Runell Gess, MD;  Location: [facility] INVASIVE CV LAB;  Service: Cardiovascular;  Laterality: N/A;  . HERNIA REPAIR    . LEFT HEART CATH AND CORONARY ANGIOGRAPHY N/A 05/19/2016   Procedure: Left Heart Cath and Coronary Angiography;  Surgeon: Runell Gess, MD;  Location: [facility] INVASIVE CV LAB;  Service: Cardiovascular;  Laterality: N/A;  . LEFT HEART  CATHETERIZATION WITH CORONARY ANGIOGRAM N/A 12/16/2013   Procedure: LEFT HEART CATHETERIZATION WITH CORONARY ANGIOGRAM;  Surgeon: Corky Crafts, MD;  Location: [facility] CATH LAB;  Service: Cardiovascular;  Laterality: N/A;  . PERCUTANEOUS CORONARY STENT INTERVENTION (PCI-S)  12/16/2013   Procedure: PERCUTANEOUS CORONARY STENT INTERVENTION (PCI-S);  Surgeon: Corky Crafts, MD;  Location: [facility] CATH LAB;  Service: Cardiovascular;;  distal rca    Family History  Problem Relation Age of Onset  . Heart attack Father 60  . Heart attack Brother 23   Social History:  reports that he has been smoking cigarettes.  He started smoking about 29 years ago. He has a 25.00 pack-year smoking history. He quit smokeless tobacco use about 29 years ago. His smokeless tobacco use included snuff. He reports that he has current or past drug history. Drug: Marijuana. He reports that he does not drink alcohol.  Allergies:  Allergies  Allergen Reactions  . Suboxone [Buprenorphine Hcl-Naloxone Hcl] Other (See Comments)    Caused kidney failure  . Benadryl [Diphenhydramine] Other (See Comments)    LEG CRAMPING  . Seroquel [Quetiapine] Other (See Comments)    "makes my legs cramp"  . Tylenol Pm Extra [Diphenhydramine-Apap (Sleep)] Other (See Comments)    "makes my leg cramp"    Medications Prior to Admission  Medication Sig Dispense Refill  . aspirin EC 81 MG tablet Take 81 mg by mouth daily.    [name] atorvastatin (LIPITOR) 80  MG tablet Take 1 tablet (80 mg total) by mouth daily. 90 tablet 1  . carvedilol (COREG) 6.25 MG tablet Take 6.25 mg by mouth 2 (two) times daily with a meal.    . clopidogrel (PLAVIX) 75 MG tablet Take 1 tablet (75 mg total) by mouth daily. 90 tablet 3  . lisinopril (PRINIVIL,ZESTRIL) 10 MG tablet Take 1 tablet (10 mg total) by mouth daily. (Patient not taking: Reported on 05/29/2017) 90 tablet 1  . metoprolol succinate (TOPROL-XL) 50 MG 24 hr tablet Take 1 tablet (50 mg total) by mouth  daily. Take with or immediately following a meal. (Patient not taking: Reported on 05/29/2017) 90 tablet 1    No results found for this or any previous visit (from the past 48 hour(s)). No results found.  ROS  Blood pressure (!) 151/85, pulse 85, temperature 98 F (36.7 C), temperature source Oral, resp. rate 12, SpO2 97 %. Physical Exam  Constitutional: He appears well-developed and well-nourished.  HENT:  Mouth/Throat: Oropharynx is clear and moist.  Eyes: Conjunctivae are normal. No scleral icterus.  Neck: No thyromegaly present.  Cardiovascular: Normal rate, regular rhythm and normal heart sounds.  No murmur heard. Respiratory: Effort normal and breath sounds normal.  GI:  Abdomen is full.  Scar in both inguinal areas from previous hernia repair.  Abdomen is soft and nontender without organomegaly or masses.  Musculoskeletal: He exhibits no edema.  Lymphadenopathy:    He has no cervical adenopathy.  Neurological: He is alert.  Skin: Skin is warm and dry.     Assessment/Plan High risk screening colonoscopy.  Lionel December, MD 06/06/2017, 10:23 AM

## 2017-06-06 NOTE — Op Note (Signed)
[facility] Patient Name: Don Fletcher Procedure Date: 06/06/2017 10:14 AM MRN: [id number] Date of Birth: December 12, 1970 Attending MD: Lionel December , MD CSN: [id number] Age: 47 Admit Type: Outpatient Procedure:                Colonoscopy Indications:              Screening in patient at increased risk: Colorectal                            cancer in mother 29 or older, Screening in patient                            at increased risk: Colorectal cancer in brother                            before age 35 Providers:                Lionel December, MD, Buel Ream. Thomasena Edis RN, RN, Edythe Clarity, Technician Referring MD:             Avon Gully, MD Medicines:                Meperidine 100 mg IV, Midazolam 15 mg IV Complications:            No immediate complications. Estimated Blood Loss:     Estimated blood loss: none. Procedure:                Pre-Anesthesia Assessment:                           - Prior to the procedure, a History and Physical                            was performed, and patient medications and                            allergies were reviewed. The patient's tolerance of                            previous anesthesia was also reviewed. The risks                            and benefits of the procedure and the sedation                            options and risks were discussed with the patient.                            All questions were answered, and informed consent                            was obtained. Prior Anticoagulants: The patient  last took aspirin 7 days and Plavix (clopidogrel) 7                            days prior to the procedure. ASA Grade Assessment:                            III - A patient with severe systemic disease. After                            reviewing the risks and benefits, the patient was                            deemed in satisfactory condition to undergo the       procedure.                           After obtaining informed consent, the colonoscope                            was passed under direct vision. Throughout the                            procedure, the patient's blood pressure, pulse, and                            oxygen saturations were monitored continuously. The                            EC-[id number] ([id number]) scope was introduced through                            the anus and advanced to the the cecum, identified                            by appendiceal orifice and ileocecal valve. The                            colonoscopy was performed without difficulty. The                            patient tolerated the procedure well. The quality                            of the bowel preparation was good. The ileocecal                            valve, appendiceal orifice, and rectum were                            photographed. Scope In: 10:36:53 AM Scope Out: 10:54:46 AM Scope Withdrawal Time: 0 hours 7 minutes 20 seconds  Total Procedure Duration: 0 hours 17 minutes 53 seconds  Findings:      The perianal and digital rectal examinations were normal.  The colon (entire examined portion) appeared normal.      External and internal hemorrhoids were found during retroflexion. The       hemorrhoids were medium-sized. Impression:               - The entire examined colon is normal.                           - External and internal hemorrhoids.                           - No specimens collected. Moderate Sedation:      Moderate (conscious) sedation was administered by the endoscopy nurse       and supervised by the endoscopist. The following parameters were       monitored: oxygen saturation, heart rate, blood pressure, CO2       capnography and response to care. Total physician intraservice time was       28 minutes. Recommendation:           - Patient has a contact number available for                            emergencies. The  signs and symptoms of potential                            delayed complications were discussed with the                            patient. Return to normal activities tomorrow.                            Written discharge instructions were provided to the                            patient.                           - Resume previous diet today.                           - Continue present medications.                           - Resume aspirin today and Plavix (clopidogrel)                            today at prior doses.                           - Repeat colonoscopy in 5 years for screening                            purposes. Procedure Code(s):        --- Professional ---                           [phone], Colonoscopy, flexible; diagnostic, including  collection of specimen(s) by brushing or washing,                            when performed (separate procedure)                           G0500, Moderate sedation services provided by the                            same physician or other qualified health care                            professional performing a gastrointestinal                            endoscopic service that sedation supports,                            requiring the presence of an independent trained                            observer to assist in the monitoring of the                            patient's level of consciousness and physiological                            status; initial 15 minutes of intra-service time;                            patient age 64 years or older (additional time may                            be reported with [id number], as appropriate)                           [phone], Moderate sedation services provided by the                            same physician or other qualified health care                            professional performing the diagnostic or                            therapeutic service that the  sedation supports,                            requiring the presence of an independent trained                            observer to assist in the monitoring of the  patient's level of consciousness and physiological                            status; each additional 15 minutes intraservice                            time (List separately in addition to code for                            primary service) Diagnosis Code(s):        --- Professional ---                           K64.8, Other hemorrhoids                           Z80.0, Family history of malignant neoplasm of                            digestive organs CPT copyright 2017 American [employer]. All rights reserved. The codes documented in this report are preliminary and upon coder review may  be revised to meet current compliance requirements. Lionel December, MD Lionel December, MD 06/06/2017 11:04:12 AM This report has been signed electronically. Number of Addenda: 0

## 2017-06-06 NOTE — Discharge Instructions (Signed)
Resume usual medications including aspirin and clopidogrel at usual dose. Resume usual diet. No driving for 24 hours. Next high risk screening colonoscopy in 5 years.   Colonoscopy, Adult, Care After This sheet gives you information about how to care for yourself after your procedure. Your doctor may also give you more specific instructions. If you have problems or questions, call your doctor. Follow these instructions at home: General instructions   For the first 24 hours after the procedure: ? Do not drive or use machinery. ? Do not sign important documents. ? Do not drink alcohol. ? Do your daily activities more slowly than normal. ? Eat foods that are soft and easy to digest. ? Rest often.  Take over-the-counter or prescription medicines only as told by your doctor.  It is up to you to get the results of your procedure. Ask your doctor, or the department performing the procedure, when your results will be ready. To help cramping and bloating:  Try walking around.  Put heat on your belly (abdomen) as told by your doctor. Use a heat source that your doctor recommends, such as a moist heat pack or a heating pad. ? Put a towel between your skin and the heat source. ? Leave the heat on for 20-30 minutes. ? Remove the heat if your skin turns bright red. This is especially important if you cannot feel pain, heat, or cold. You can get burned. Eating and drinking  Drink enough fluid to keep your pee (urine) clear or pale yellow.  Return to your normal diet as told by your doctor. Avoid heavy or fried foods that are hard to digest.  Avoid drinking alcohol for as long as told by your doctor. Contact a doctor if:  You have blood in your poop (stool) 2-3 days after the procedure. Get help right away if:  You have more than a small amount of blood in your poop.  You see large clumps of tissue (blood clots) in your poop.  Your belly is swollen.  You feel sick to your stomach  (nauseous).  You throw up (vomit).  You have a fever.  You have belly pain that gets worse, and medicine does not help your pain. This information is not intended to replace advice given to you by your health care provider. Make sure you discuss any questions you have with your health care provider. Document Released: 02/25/2010 Document Revised: 10/18/2015 Document Reviewed: 10/18/2015 Elsevier Interactive Patient Education  2017 [employer]  Hemorrhoids Hemorrhoids are swollen veins in and around the rectum or anus. There are two types of hemorrhoids:  Internal hemorrhoids. These occur in the veins that are just inside the rectum. They may poke through to the outside and become irritated and painful.  External hemorrhoids. These occur in the veins that are outside of the anus and can be felt as a painful swelling or hard lump near the anus.  Most hemorrhoids do not cause serious problems, and they can be managed with home treatments such as diet and lifestyle changes. If home treatments do not help your symptoms, procedures can be done to shrink or remove the hemorrhoids. What are the causes? This condition is caused by increased pressure in the anal area. This pressure may result from various things, including:  Constipation.  Straining to have a bowel movement.  Diarrhea.  Pregnancy.  Obesity.  Sitting for long periods of time.  Heavy lifting or other activity that causes you to strain.  Anal sex.  What  are the signs or symptoms? Symptoms of this condition include:  Pain.  Anal itching or irritation.  Rectal bleeding.  Leakage of stool (feces).  Anal swelling.  One or more lumps around the anus.  How is this diagnosed? This condition can often be diagnosed through a visual exam. Other exams or tests may also be done, such as:  Examination of the rectal area with a gloved hand (digital rectal exam).  Examination of the anal canal using a small tube  (anoscope).  A blood test, if you have lost a significant amount of blood.  A test to look inside the colon (sigmoidoscopy or colonoscopy).  How is this treated? This condition can usually be treated at home. However, various procedures may be done if dietary changes, lifestyle changes, and other home treatments do not help your symptoms. These procedures can help make the hemorrhoids smaller or remove them completely. Some of these procedures involve surgery, and others do not. Common procedures include:  Rubber band ligation. Rubber bands are placed at the base of the hemorrhoids to cut off the blood supply to them.  Sclerotherapy. Medicine is injected into the hemorrhoids to shrink them.  Infrared coagulation. A type of light energy is used to get rid of the hemorrhoids.  Hemorrhoidectomy surgery. The hemorrhoids are surgically removed, and the veins that supply them are tied off.  Stapled hemorrhoidopexy surgery. A circular stapling device is used to remove the hemorrhoids and use staples to cut off the blood supply to them.  Follow these instructions at home: Eating and drinking  Eat foods that have a lot of fiber in them, such as whole grains, beans, nuts, fruits, and vegetables. Ask your health care provider about taking products that have added fiber (fiber supplements).  Drink enough fluid to keep your urine clear or pale yellow. Managing pain and swelling  Take warm sitz baths for 20 minutes, 3-4 times a day to ease pain and discomfort.  If directed, apply ice to the affected area. Using ice packs between sitz baths may be helpful. ? Put ice in a plastic bag. ? Place a towel between your skin and the bag. ? Leave the ice on for 20 minutes, 2-3 times a day. General instructions  Take over-the-counter and prescription medicines only as told by your health care provider.  Use medicated creams or suppositories as told.  Exercise regularly.  Go to the bathroom when you  have the urge to have a bowel movement. Do not wait.  Avoid straining to have bowel movements.  Keep the anal area dry and clean. Use wet toilet paper or moist towelettes after a bowel movement.  Do not sit on the toilet for long periods of time. This increases blood pooling and pain. Contact a health care provider if:  You have increasing pain and swelling that are not controlled by treatment or medicine.  You have uncontrolled bleeding.  You have difficulty having a bowel movement, or you are unable to have a bowel movement.  You have pain or inflammation outside the area of the hemorrhoids. This information is not intended to replace advice given to you by your health care provider. Make sure you discuss any questions you have with your health care provider. Document Released: 01/21/2000 Document Revised: 06/23/2015 Document Reviewed: 10/07/2014 Elsevier Interactive Patient Education  [employer].

## 2017-06-12 ENCOUNTER — Encounter (HOSPITAL_COMMUNITY): Payer: Self-pay | Admitting: Internal Medicine

## 2017-07-03 ENCOUNTER — Encounter: Payer: Self-pay | Admitting: Cardiology

## 2017-07-03 ENCOUNTER — Other Ambulatory Visit: Payer: Self-pay

## 2017-07-03 ENCOUNTER — Ambulatory Visit: Payer: Medicaid Other | Admitting: Cardiology

## 2017-07-03 VITALS — BP 158/105 | HR 84 | Ht 67.0 in | Wt 179.0 lb

## 2017-07-03 DIAGNOSIS — M79604 Pain in right leg: Secondary | ICD-10-CM | POA: Diagnosis not present

## 2017-07-03 DIAGNOSIS — I10 Essential (primary) hypertension: Secondary | ICD-10-CM

## 2017-07-03 DIAGNOSIS — M79605 Pain in left leg: Secondary | ICD-10-CM

## 2017-07-03 DIAGNOSIS — I2510 Atherosclerotic heart disease of native coronary artery without angina pectoris: Secondary | ICD-10-CM

## 2017-07-03 DIAGNOSIS — R42 Dizziness and giddiness: Secondary | ICD-10-CM | POA: Diagnosis not present

## 2017-07-03 DIAGNOSIS — I5022 Chronic systolic (congestive) heart failure: Secondary | ICD-10-CM

## 2017-07-03 MED ORDER — MECLIZINE HCL 25 MG PO TABS: 25.0000 mg | ORAL_TABLET | Freq: Three times a day (TID) | ORAL | 0 refills | Status: DC | PRN

## 2017-07-03 NOTE — Patient Instructions (Signed)
Your physician wants you to follow-up in: 6 MONTHS WITH DR [facility] You will receive a reminder letter in the mail two months in advance. If you don't receive a letter, please call our office to schedule the follow-up appointment.  Your physician has recommended you make the following change in your medication:   TAKE MECLIZINE 25 MG EVERY 8 HOURS AS NEEDED FOR DIZZINESS  PLEASE CALL us WITH YOUR CURRENT MEDICATIONS  Your physician has requested that you have an echocardiogram. Echocardiography is a painless test that uses sound waves to create images of your heart. It provides your doctor with information about the size and shape of your heart and how well your heart's chambers and valves are working. This procedure takes approximately one hour. There are no restrictions for this procedure.  Your physician has requested that you have an ankle brachial index (ABI). During this test an ultrasound and blood pressure cuff are used to evaluate the arteries that supply the arms and legs with blood. Allow thirty minutes for this exam. There are no restrictions or special instructions.

## 2017-07-03 NOTE — Addendum Note (Signed)
Addended by: Burman Nieves T on: 07/03/2017 12:09 PM   Modules accepted: Orders

## 2017-07-03 NOTE — Progress Notes (Signed)
Clinical Summary Don Fletcher is a 47 y.o.male seen today for follow up of the following medical problems.   1. CAD - pt admitted 12/16/13 with inferolateral STEMI, received BMS to proximal RPL. Full cath report below. LVEF 55% by LVgram, LVEF 50-55% by echo.  - on DAPT with ASA and effient, initially on brillinta but had some SOB and was changed while in the hospital  - Lexiscan MPI after cath in setting of chest pain Jan 2016 with inferior scar with mild to moderate ischemia, LVEF 60%. Overall low risk. Managed medically - admit 07/2014 with chest pain, cath showed occlusion in the PLA with left to right collateralization, managed medically.  -Admit 05/2016 with STEMI, received DES to LCX. LVgram 35-45% at taht time.  - patient left AMA shortly after procedure, did not have echo done.     - no recent chest pain. No SOB - only taking 3 meds, he is unsure which ones at home.    2. COPD - followed by Dr Don Fletcher.   3. Depression - follow by psychiatry   4. Vertigo/Dizziness - dizzienss with head position changes. Can occur with closing eyes and turning head to side. Can have some issues with sinus congestion.  - also can occur with standing. - drinks gallon of tea daily, nothing else.    5. Leg pains - can have some cramping with walking x 1 block bilateral caves.   6. HTN - has not taken meds yet today.    Past Medical History:  Diagnosis Date  . Bipolar 1 disorder (HCC)   . CAD (coronary artery disease)    a. 12/16/13 inferolat STEMI s/p BMS to RPL. b. Cath 08/2014: interim occlusion of the PLA stent of the RCA, collateralized from the left coronary artery with diffuse nonobstructive CAD;  c. STEMI/PCI: LM nl, LAD nl, LCX 116m/d (2.75x12 synergy DES), RCA 30p, RPDA 75ost/p, RPAV 100 w/ L->R collats.  . Chronic back pain   . HLD (hyperlipidemia)   . Hypertension   . Ischemic cardiomyopathy    a. 12/2013: EF 50-55% by echo with +WMA. b. EF 45% by cath in  08/2014.  . Ischemic cardiomyopathy 05/19/2016  . Peptic ulcer disease   . Schizophrenic disorder (HCC)   . Tobacco abuse      Allergies  Allergen Reactions  . Suboxone [Buprenorphine Hcl-Naloxone Hcl] Other (See Comments)    Caused kidney failure  . Benadryl [Diphenhydramine] Other (See Comments)    LEG CRAMPING  . Seroquel [Quetiapine] Other (See Comments)    "makes my legs cramp"  . Tylenol Pm Extra [Diphenhydramine-Apap (Sleep)] Other (See Comments)    "makes my leg cramp"     Current Outpatient Medications  Medication Sig Dispense Refill  . aspirin EC 81 MG tablet Take 81 mg by mouth daily.    [name] atorvastatin (LIPITOR) 80 MG tablet Take 1 tablet (80 mg total) by mouth daily. 90 tablet 1  . carvedilol (COREG) 6.25 MG tablet Take 6.25 mg by mouth 2 (two) times daily with a meal.    . clopidogrel (PLAVIX) 75 MG tablet Take 1 tablet (75 mg total) by mouth daily. 90 tablet 3  . lisinopril (PRINIVIL,ZESTRIL) 10 MG tablet Take 1 tablet (10 mg total) by mouth daily. (Patient not taking: Reported on 05/29/2017) 90 tablet 1  . metoprolol succinate (TOPROL-XL) 50 MG 24 hr tablet Take 1 tablet (50 mg total) by mouth daily. Take with or immediately following a meal. (Patient not taking: Reported  on 05/29/2017) 90 tablet 1   No current facility-administered medications for this visit.      Past Surgical History:  Procedure Laterality Date  . CARDIAC CATHETERIZATION N/A 08/07/2014   Procedure: Left Heart Cath and Coronary Angiography;  Surgeon: Don Bollman, MD;  Location: [facility] INVASIVE CV LAB;  Service: Cardiovascular;  Laterality: N/A;  . COLONOSCOPY N/A 06/06/2017   Procedure: COLONOSCOPY;  Surgeon: Don Hippo, MD;  Location: [facility] ENDO SUITE;  Service: Endoscopy;  Laterality: N/A;  10:50  . CORONARY STENT INTERVENTION N/A 05/19/2016   Procedure: Coronary Stent Intervention;  Surgeon: Don Gess, MD;  Location: [facility] INVASIVE CV LAB;  Service: Cardiovascular;  Laterality: N/A;  .  HERNIA REPAIR    . LEFT HEART CATH AND CORONARY ANGIOGRAPHY N/A 05/19/2016   Procedure: Left Heart Cath and Coronary Angiography;  Surgeon: Don Gess, MD;  Location: [facility] INVASIVE CV LAB;  Service: Cardiovascular;  Laterality: N/A;  . LEFT HEART CATHETERIZATION WITH CORONARY ANGIOGRAM N/A 12/16/2013   Procedure: LEFT HEART CATHETERIZATION WITH CORONARY ANGIOGRAM;  Surgeon: Don Crafts, MD;  Location: [facility] CATH LAB;  Service: Cardiovascular;  Laterality: N/A;  . PERCUTANEOUS CORONARY STENT INTERVENTION (PCI-S)  12/16/2013   Procedure: PERCUTANEOUS CORONARY STENT INTERVENTION (PCI-S);  Surgeon: Don Crafts, MD;  Location: [facility] CATH LAB;  Service: Cardiovascular;;  distal rca     Allergies  Allergen Reactions  . Suboxone [Buprenorphine Hcl-Naloxone Hcl] Other (See Comments)    Caused kidney failure  . Benadryl [Diphenhydramine] Other (See Comments)    LEG CRAMPING  . Seroquel [Quetiapine] Other (See Comments)    "makes my legs cramp"  . Tylenol Pm Extra [Diphenhydramine-Apap (Sleep)] Other (See Comments)    "makes my leg cramp"      Family History  Problem Relation Age of Onset  . Heart attack Father 62  . Heart attack Brother 2     Social History Don Fletcher reports that he has been smoking cigarettes.  He started smoking about 29 years ago. He has a 25.00 pack-year smoking history. He quit smokeless tobacco use about 29 years ago. His smokeless tobacco use included snuff. Don Fletcher reports that he does not drink alcohol.   Review of Systems CONSTITUTIONAL: No weight loss, fever, chills, weakness or fatigue.  HEENT: Eyes: No visual loss, blurred vision, double vision or yellow sclerae.No hearing loss, sneezing, congestion, runny nose or sore throat.  SKIN: No rash or itching.  CARDIOVASCULAR: per hpi RESPIRATORY: No shortness of breath, cough or sputum.  GASTROINTESTINAL: No anorexia, nausea, vomiting or diarrhea. No abdominal pain or blood.  GENITOURINARY: No  burning on urination, no polyuria NEUROLOGICAL: No headache, dizziness, syncope, paralysis, ataxia, numbness or tingling in the extremities. No change in bowel or bladder control.  MUSCULOSKELETAL: leg pains LYMPHATICS: No enlarged nodes. No history of splenectomy.  PSYCHIATRIC: No history of depression or anxiety.  ENDOCRINOLOGIC: No reports of sweating, cold or heat intolerance. No polyuria or polydipsia.  [name]   Physical Examination Vitals:   07/03/17 1028  BP: (!) 158/105  Pulse: 84  SpO2: 97%   Vitals:   07/03/17 1028  Weight: 179 lb (81.2 kg)  Height:  (1.702 m)    Gen: resting comfortably, no acute distress HEENT: no scleral icterus, pupils equal round and reactive, no palptable cervical adenopathy,  CV: RRR, no m/r/g, no jvd. 1+ bilateral DP pulses, 2+ bilateral PT pulses Resp: Clear to auscultation bilaterally GI: abdomen is soft, non-tender, non-distended, normal bowel sounds, no hepatosplenomegaly MSK:  extremities are warm, no edema.  Skin: warm, no rash Neuro:  no focal deficits Psych: appropriate affect   Diagnostic Studies 12/2013 Echo Study Conclusions  - Left ventricle: The cavity size was normal. Wall thickness was normal. Systolic function was normal. The estimated ejection fraction was in the range of 50% to 55%. There is akinesis of the basal-midinferior myocardium. There is akinesis of the basalinferolateral myocardium. Left ventricular diastolic function parameters were normal. - Aortic valve: Mildly calcified annulus. Trileaflet. - Mitral valve: Mildly thickened leaflets . There was trivial regurgitation. - Right atrium: Central venous pressure (est): 3 mm Hg. - Atrial septum: No defect or patent foramen ovale was identified. - Tricuspid valve: There was trivial regurgitation. - Pulmonary arteries: Systolic pressure could not be accurately estimated. - Pericardium, extracardiac: There was no pericardial  effusion.  Impressions:  - Normal LV wall thickness and chamber size with LVEF 50-55%, wall motion abnormalities as outlined above consistent with ischemic heart disease, normal diastolic function. Mildly thickened mitral leaflets with trivial mitral regurgitation. Trivial tricuspid regurgitation, unable to assess PASP.  12/16/13 Cath HEMODYNAMICS: Aortic pressure was 91/60; LV pressure was 91/9; LVEDP 17. There was no gradient between the left ventricle and aorta.  ANGIOGRAPHIC DATA: The left main coronary artery has mild disease. The left anterior descending artery is a large vessel proximally. In the mid vessel, there is mild, diffuse disease. There are several medium-size diagonals which are widely patent. The left circumflex artery is large vessel. There is mild disease in the proximal portion. The OM1 is a large vessel. There is mild to moderate disease in the mid vessel, up to 40%. The right coronary artery is a large, dominant vessel. In the proximal portion, there is a moderate stenosis of up to 50%. The posterior descending artery is a large vessel and reaches the apex and is widely patent. The posterior lateral artery is occluded proximally, just past the ostium. After opening the vessel of the balloon, it was noted that the disease extended into the mid posterior lateral artery. The posterior lateral artery was small caliber. LEFT VENTRICULOGRAM: Left ventricular angiogram was done in the 30 RAO projection and revealed mild, basal inferior hypokinesis with normal systolic function with an estimated ejection fraction of 55%. LVEDP was 17 mmHg. PCI NARRATIVE: A JR4 guiding catheter used to engage the RCA. Angiomax was used for anticoagulation. ACT was used to check that the Angiomax was therapeutic. A pro-water wire was placed across the area disease in the posterior lateral artery. An aspiration catheter was advanced to the ostium of the posterior lateral artery.  Aspiration thrombectomy was performed with some improvement in flow. A 2.0 x 12 balloon was then used to predilate patient. The vessel was fairly small in caliber. Since the patient admits to prior bleeding issues as well as frequently forgetting to take his medications, we opted for a bare-metal stent. A 2.0 x 23 vision bare metal stent was deployed. The proximalstent was postdilated with a 2.25 by 12 noncompliant balloon. There was an excellent angiographic result of the proximal stent and the bifurcation with the posterior descending artery. There was a stepdown at the distal edge of the stent. TIMI-3 flow was restored. The patient's pain was significantly improved. IMPRESSIONS:  Widely patent left main coronary artery.  Mild disease in the left anterior descending artery and its branches.  Mild disease in the left circumflex artery and its branches.  Moderate disease in the proximal right coronary artery.occluded posterior lateral artery which was the  culprit for the patient's presentation. This was successfully treated with a 2.0 x 23 vision bare metal stent, postdilated to 2.25 mm in diameter.  Normal left ventricular systolic function. LVEDP 17 mmHg. Ejection fraction 55%. RECOMMENDATION: Continue dual antiplatelet therapy for at least 30 days and likely longer. A bare-metal stent was chosen because the patient admits to forgetting to take his medication fairly frequently. He also stated that his wife does not remember to take medicines as well. Continue aggressive secondary prevention. He'll be watched in the ICU. Continue Angiomax at the reduced rate until the bag runs out. He needs to stop smoking. Significant family h/o CAD. Start beta blocker. Start statin. Nicotine patch. [facility] if that is easier for him to be compliant.   Jan 2016 Lexiscan MPI IMPRESSION: 1. Region of a mixed scar and small area of mild to moderate peri-infarct ischemia noted in  the inferior wall as outlined. Cannot exclude a minor region of mid anterior ischemia as well.  2. Normal left ventricular wall motion.  3. Left ventricular ejection fraction 60%  4. Overall low-risk stress test findings*.  08/2014 cath FINAL CONCLUSIONS:  SINGLE VESSEL CAD WITH OCCLUSION OF THE PLA STENT IN THE RIGHT CORONARY, COLLATERALIZED FROM THE LEFT CORONARY ARTERY  DIFFUSE NONOBSTRUCTIVE CAD  MILD SEGMENTAL LV DYSFUNCTION WITH INFERIOR AKINESIS AND ESTIMATED LVEF 45%  RECOMMENDATIONS:  MEDICAL THERAPY  OK FOR DISCHARGE HOME TODAY   05/2016 cath  Prox RCA lesion, 30 %stenosed.  Post Atrio lesion, 100 %stenosed.  Ost RPDA to RPDA lesion, 75 %stenosed.  Mid Cx to Dist Cx lesion, 100 %stenosed.  Post intervention, there is a 0% residual stenosis.  A stent was successfully placed.  There is moderate to severe left ventricular systolic dysfunction.  LV end diastolic pressure is mildly elevated.  The left ventricular ejection fraction is 35-45% by visual estimate.         Assessment and Plan   1. CAD/Chronic systolic HF - no recent symptoms - has completed 1 year of DAPT, now off plavix - continue current meds. Obtain echo, had decreased LVEF by LVgram at time of MI. He left AMA prior to having echo done. - encouraged medication complance, he is to call us with exactly the list of meds he has been taking at home. Only taking 3 pills, should be a total of 4 (asa, atorvastatin, prinzide, Toprol) - EKG today in clinic shows SR, no ischemic changes  2. HTN - elevated but has not taken meds yet today, also may not be on all his meds at home - he is to call and update [place] on his home pills.   3. Hyperlipidemia -he will continue statin  4. Dizziness - symptoms mixed, suggesting vertigo but also possibly some orthostasis - orthostatics today are negative. Poor oral hydration, encouraged increased water intake and less caffeinated beverages -  given Rx for meclizine  q8hrs prn dizziness  5. Leg pains - mixed symptoms, likely some lower back issues but also possible PAD. Decreased pulses on exam, history of CAD - obtain ABIs         Antoine Poche, M.D.

## 2017-07-04 ENCOUNTER — Other Ambulatory Visit: Payer: Self-pay | Admitting: Cardiology

## 2017-07-04 DIAGNOSIS — I739 Peripheral vascular disease, unspecified: Secondary | ICD-10-CM

## 2017-07-06 ENCOUNTER — Other Ambulatory Visit: Payer: Self-pay | Admitting: Cardiology

## 2017-07-10 ENCOUNTER — Telehealth: Payer: Self-pay | Admitting: *Deleted

## 2017-07-10 MED ORDER — ATORVASTATIN CALCIUM 80 MG PO TABS: 80.0000 mg | ORAL_TABLET | Freq: Every day | ORAL | 1 refills | Status: DC

## 2017-07-10 NOTE — Telephone Encounter (Signed)
Pt aware and requesting rx [facility]. Medication sent to pharmacy.

## 2017-07-10 NOTE — Telephone Encounter (Signed)
-----   Message from Antoine PocheJonathan F Branch, MD sent at 07/10/2017  2:14 PM EDT ----- He should be on atorvastatin 80mg  daily  Dominga FerryJ Branch MD ----- Message ----- From: Albertine PatriciaAshworth, Virgel Haro T, CMA Sent: 07/03/2017  12:04 PM To: Antoine PocheJonathan F Branch, MD  I updated this pt medications - he not taking plavix or metoprolol taking ASA carvedilol 6.25 mg bid and lisinopril - hctz 20/12.5 mg daily   Bohden Dung

## 2017-07-19 ENCOUNTER — Ambulatory Visit (INDEPENDENT_AMBULATORY_CARE_PROVIDER_SITE_OTHER): Payer: Medicaid Other

## 2017-07-19 ENCOUNTER — Other Ambulatory Visit: Payer: Self-pay

## 2017-07-19 DIAGNOSIS — I5022 Chronic systolic (congestive) heart failure: Secondary | ICD-10-CM | POA: Diagnosis not present

## 2017-07-19 DIAGNOSIS — I739 Peripheral vascular disease, unspecified: Secondary | ICD-10-CM | POA: Diagnosis not present

## 2017-08-01 ENCOUNTER — Telehealth: Payer: Self-pay | Admitting: *Deleted

## 2017-08-01 NOTE — Telephone Encounter (Signed)
Pt aware and voiced understanding - will f/u with pcp

## 2017-08-01 NOTE — Telephone Encounter (Signed)
-----   Message from Antoine PocheJonathan F Branch, MD sent at 07/30/2017  2:57 PM EDT ----- Leg tests show normal circulation, his symptoms are not related to any blockages in the leg arteries. Continue to f/u with pcp for further evaluation   Dominga FerryJ Branch MD

## 2017-12-28 ENCOUNTER — Encounter (HOSPITAL_COMMUNITY): Payer: Self-pay | Admitting: *Deleted

## 2017-12-28 ENCOUNTER — Other Ambulatory Visit: Payer: Self-pay

## 2017-12-28 ENCOUNTER — Emergency Department (HOSPITAL_COMMUNITY)
Admission: EM | Admit: 2017-12-28 | Discharge: 2017-12-29 | Disposition: A | Discharge status: Home / Self Care | Payer: Medicaid Other | Attending: Emergency Medicine | Admitting: Emergency Medicine

## 2017-12-28 DIAGNOSIS — Y9389 Activity, other specified: Secondary | ICD-10-CM | POA: Diagnosis not present

## 2017-12-28 DIAGNOSIS — Z7982 Long term (current) use of aspirin: Secondary | ICD-10-CM | POA: Diagnosis not present

## 2017-12-28 DIAGNOSIS — S00251A Superficial foreign body of right eyelid and periocular area, initial encounter: Secondary | ICD-10-CM | POA: Insufficient documentation

## 2017-12-28 DIAGNOSIS — Z79899 Other long term (current) drug therapy: Secondary | ICD-10-CM | POA: Insufficient documentation

## 2017-12-28 DIAGNOSIS — I10 Essential (primary) hypertension: Secondary | ICD-10-CM | POA: Insufficient documentation

## 2017-12-28 DIAGNOSIS — W458XXA Other foreign body or object entering through skin, initial encounter: Secondary | ICD-10-CM | POA: Insufficient documentation

## 2017-12-28 DIAGNOSIS — F17210 Nicotine dependence, cigarettes, uncomplicated: Secondary | ICD-10-CM | POA: Diagnosis not present

## 2017-12-28 DIAGNOSIS — I252 Old myocardial infarction: Secondary | ICD-10-CM | POA: Diagnosis not present

## 2017-12-28 DIAGNOSIS — Y929 Unspecified place or not applicable: Secondary | ICD-10-CM | POA: Diagnosis not present

## 2017-12-28 DIAGNOSIS — I2510 Atherosclerotic heart disease of native coronary artery without angina pectoris: Secondary | ICD-10-CM | POA: Diagnosis not present

## 2017-12-28 DIAGNOSIS — Y999 Unspecified external cause status: Secondary | ICD-10-CM | POA: Insufficient documentation

## 2017-12-28 MED ORDER — TETRACAINE HCL 0.5 % OP SOLN
2.0000 [drp] | Freq: Once | OPHTHALMIC | Status: AC
  Administered 2017-12-28: 2 [drp] via OPHTHALMIC
  Filled 2017-12-28: qty 4

## 2017-12-28 MED ORDER — FLUORESCEIN SODIUM 1 MG OP STRP
1.0000 | ORAL_STRIP | Freq: Once | OPHTHALMIC | Status: AC
  Administered 2017-12-28: 1 via OPHTHALMIC
  Filled 2017-12-28: qty 1

## 2017-12-28 NOTE — ED Triage Notes (Signed)
Right eye pain, thinks he has something in eye

## 2017-12-28 NOTE — Discharge Instructions (Addendum)
There was a small foreign body underneath your right upper eyelid that I removed. Fortunately it did not scratch your cornea, the clear covering over your eyeball. You can take motrin or tylenol for pain. That feeling you had of something cutting your eye when you blinked should be gone now.

## 2017-12-28 NOTE — ED Provider Notes (Signed)
[facility] EMERGENCY DEPARTMENT Provider Note   CSN: [id number] Arrival date & time: 12/28/17  2255  Time seen 23:25 PM    History   Chief Complaint Chief Complaint  Patient presents with  . Eye Problem    HPI Don Fletcher is a 47 y.o. male.  HPI patient states at 9 AM this morning he was replacing an oxygen sensor on his car and he states a part "exploded" and something flew behind his safety glasses and went into his right eye.  He states since then he has had a feeling of something cutting his eye when he blinks.  He states he has some blurred vision.  He has had some clear tearing.  He does not have diabetes.  He thinks his last tetanus was over 10 years ago but he is not sure.  PCP Avon Gully, MD   Past Medical History:  Diagnosis Date  . Bipolar 1 disorder (HCC)   . CAD (coronary artery disease)    a. 12/16/13 inferolat STEMI s/p BMS to RPL. b. Cath 08/2014: interim occlusion of the PLA stent of the RCA, collateralized from the left coronary artery with diffuse nonobstructive CAD;  c. STEMI/PCI: LM nl, LAD nl, LCX 114m/d (2.75x12 synergy DES), RCA 30p, RPDA 75ost/p, RPAV 100 w/ L->R collats.  . Chronic back pain   . HLD (hyperlipidemia)   . Hypertension   . Ischemic cardiomyopathy    a. 12/2013: EF 50-55% by echo with +WMA. b. EF 45% by cath in 08/2014.  . Ischemic cardiomyopathy 05/19/2016  . Peptic ulcer disease   . Schizophrenic disorder (HCC)   . Tobacco abuse     Patient Active Problem List   Diagnosis Date Noted  . Family hx of colon cancer 02/14/2017  . STEMI (ST elevation myocardial infarction) (HCC) 05/19/2016  . Acute ST elevation myocardial infarction (STEMI) involving left circumflex coronary artery without development of Q waves (HCC) 05/19/2016  . Ischemic cardiomyopathy 05/19/2016  . Unstable angina (HCC) 08/06/2014  . ST elevation myocardial infarction involving right coronary artery (HCC)   . CAD (coronary artery disease)   . Tobacco abuse    . HLD (hyperlipidemia)   . Hypertension   . Family history of premature CAD 12/16/2013  . CAD S/P PCI:  2.0 x 23 vision bare metal stent (2.25 mm) RPL 12/16/2013    Class: Acute  . Schizophrenic disorder (HCC)   . Bipolar 1 disorder [facility])     Past Surgical History:  Procedure Laterality Date  . CARDIAC CATHETERIZATION N/A 08/07/2014   Procedure: Left Heart Cath and Coronary Angiography;  Surgeon: Tonny Bollman, MD;  Location: [facility] INVASIVE CV LAB;  Service: Cardiovascular;  Laterality: N/A;  . COLONOSCOPY N/A 06/06/2017   Procedure: COLONOSCOPY;  Surgeon: Malissa Hippo, MD;  Location: [facility] ENDO SUITE;  Service: Endoscopy;  Laterality: N/A;  10:50  . CORONARY STENT INTERVENTION N/A 05/19/2016   Procedure: Coronary Stent Intervention;  Surgeon: Runell Gess, MD;  Location: [facility] INVASIVE CV LAB;  Service: Cardiovascular;  Laterality: N/A;  . HERNIA REPAIR    . LEFT HEART CATH AND CORONARY ANGIOGRAPHY N/A 05/19/2016   Procedure: Left Heart Cath and Coronary Angiography;  Surgeon: Runell Gess, MD;  Location: [facility] INVASIVE CV LAB;  Service: Cardiovascular;  Laterality: N/A;  . LEFT HEART CATHETERIZATION WITH CORONARY ANGIOGRAM N/A 12/16/2013   Procedure: LEFT HEART CATHETERIZATION WITH CORONARY ANGIOGRAM;  Surgeon: Corky Crafts, MD;  Location: [facility] CATH LAB;  Service: Cardiovascular;  Laterality: N/A;  .  PERCUTANEOUS CORONARY STENT INTERVENTION (PCI-S)  12/16/2013   Procedure: PERCUTANEOUS CORONARY STENT INTERVENTION (PCI-S);  Surgeon: Corky CraftsJayadeep S Varanasi, MD;  [facility];  Service: Cardiovascular;;  distal rca        Home Medications    Prior to Admission medications   Medication Sig Start Date End Date Taking? Authorizing Provider  aspirin EC 81 MG tablet Take 81 mg by mouth daily.    [provider]  atorvastatin (LIPITOR) 80 MG tablet Take 1 tablet (80 mg total) by mouth daily. 07/10/17   Antoine PocheBranch, Jonathan F, MD  carvedilol (COREG) 6.25 MG tablet Take 6.25 mg  by mouth 2 (two) times daily with a meal.    [provider]  lisinopril-hydrochlorothiazide (PRINZIDE,ZESTORETIC) 20-12.5 MG tablet Take 1 tablet by mouth daily. 06/07/17   [provider]  meclizine (ANTIVERT) 25 MG tablet Take 1 tablet (25 mg total) by mouth 3 (three) times daily as needed for dizziness. 07/03/17   Antoine PocheBranch, Jonathan F, MD    Family History Family History  Problem Relation Age of Onset  . Heart attack Father 5140  . Heart attack Brother 6540    Social History Social History   Tobacco Use  . Smoking status: Current Every Day Smoker    Packs/day: 1.00    Years: 25.00    Pack years: 25.00    Types: Cigars    Start date: 05/25/1988  . Smokeless tobacco: Former NeurosurgeonUser    Types: Snuff    Quit date: 05/21/1988  Substance Use Topics  . Alcohol use: No    Alcohol/week: 0.0 standard drinks  . Drug use: Yes    Types: Marijuana     Allergies   Suboxone [buprenorphine hcl-naloxone hcl]; Benadryl [diphenhydramine]; Seroquel [quetiapine]; and Tylenol pm extra [diphenhydramine-apap (sleep)]   Review of Systems Review of Systems  All other systems reviewed and are negative.    Physical Exam Updated Vital Signs BP (!) 170/89 (BP Location: Right Arm)   Pulse (!) 111   Temp (!) 97.4 F (36.3 C) (Oral)   Resp 20   Ht 5\' 8"  (1.727 m)   Wt 83.9 kg   SpO2 97%   BMI 28.13 kg/m   Physical Exam  Constitutional: He is oriented to person, place, and time. He appears well-developed and well-nourished. No distress.  HENT:  Head: Normocephalic and atraumatic.  Right Ear: External ear normal.  Left Ear: External ear normal.  Nose: Nose normal.  Eyes: Pupils are equal, round, and reactive to light. Conjunctivae and EOM are normal.  Externally his eye appears normal.  I placed 2 drops of tetracaine in his right eye.  On slit-lamp exam under fluorescein stain there is no uptake.  When I inspect his globe I do not see any obvious foreign body on his sclera.   However when I flipped his eyelids there is a small black foreign body seen underneath the right upper eyelid on the medial aspect which was removed with a sterile saline soaked Q-tip.  I do not see any other foreign bodies.  Patient initially stated he was having a lot of discomfort from the swiping with a Q-tip.  He was left to be rechecked in a few minutes to see how he is doing.  Neck: Normal range of motion.  Cardiovascular: Normal rate.  Pulmonary/Chest: Effort normal. No respiratory distress.  Musculoskeletal: Normal range of motion.  Neurological: He is alert and oriented to person, place, and time. No cranial nerve deficit.  Skin: Skin is  warm and dry.  Psychiatric: He has a normal mood and affect. His behavior is normal. Thought content normal.  Nursing note and vitals reviewed.    ED Treatments / Results  Labs (all labs ordered are listed, but only abnormal results are displayed) Labs Reviewed - No data to display  EKG None  Radiology No results found.  Procedures Procedures (including critical care time)  Medications Ordered in ED Medications  tetracaine (PONTOCAINE) 0.5 % ophthalmic solution 2 drop (2 drops Right Eye Given by Other 12/28/17 2339)  fluorescein ophthalmic strip 1 strip (1 strip Right Eye Given by Other 12/28/17 2338)     Initial Impression / Assessment and Plan / ED Course  I have reviewed the triage vital signs and the nursing notes.  Pertinent labs & imaging results that were available during my care of the patient were reviewed by me and considered in my medical decision making (see chart for details).     When I review his medication list I do not see where he has had a tetanus booster in our system.  12 AM patient states the sharp pain he had when he blinked is gone.  He does have some minor irritation.  He is reassured that should go away quickly.  He does not have a corneal abrasion so he was not started on antibiotic drops.  He also did not  have a corneal abrasion so his tetanus status was not updated.  Final Clinical Impressions(s) / ED Diagnoses   Final diagnoses:  Foreign body of right eyelid    ED Discharge Orders    None     Plan discharge  Devoria Albe, MD, Concha Pyo, MD 12/29/17 0010

## 2018-02-04 ENCOUNTER — Other Ambulatory Visit: Payer: Self-pay

## 2018-02-04 ENCOUNTER — Emergency Department (HOSPITAL_COMMUNITY)
Admission: EM | Admit: 2018-02-04 | Discharge: 2018-02-05 | Disposition: A | Discharge status: Home / Self Care | Payer: Medicaid Other | Attending: Emergency Medicine | Admitting: Emergency Medicine

## 2018-02-04 DIAGNOSIS — M5441 Lumbago with sciatica, right side: Secondary | ICD-10-CM | POA: Insufficient documentation

## 2018-02-04 DIAGNOSIS — I10 Essential (primary) hypertension: Secondary | ICD-10-CM | POA: Diagnosis not present

## 2018-02-04 DIAGNOSIS — Z7982 Long term (current) use of aspirin: Secondary | ICD-10-CM | POA: Diagnosis not present

## 2018-02-04 DIAGNOSIS — F17290 Nicotine dependence, other tobacco product, uncomplicated: Secondary | ICD-10-CM | POA: Insufficient documentation

## 2018-02-04 DIAGNOSIS — I2510 Atherosclerotic heart disease of native coronary artery without angina pectoris: Secondary | ICD-10-CM | POA: Diagnosis not present

## 2018-02-04 DIAGNOSIS — Z79899 Other long term (current) drug therapy: Secondary | ICD-10-CM | POA: Diagnosis not present

## 2018-02-04 DIAGNOSIS — M545 Low back pain: Secondary | ICD-10-CM | POA: Diagnosis present

## 2018-02-04 NOTE — ED Triage Notes (Signed)
Pt c/o lower back pain for the last couple days after picking up trash; pt has limited rom

## 2018-02-05 MED ORDER — OXYCODONE-ACETAMINOPHEN 5-325 MG PO TABS
1.0000 | ORAL_TABLET | Freq: Once | ORAL | Status: AC
  Administered 2018-02-05: 1 via ORAL
  Filled 2018-02-05: qty 1

## 2018-02-05 MED ORDER — KETOROLAC TROMETHAMINE 60 MG/2ML IM SOLN
60.0000 mg | Freq: Once | INTRAMUSCULAR | Status: AC
  Administered 2018-02-05: 60 mg via INTRAMUSCULAR
  Filled 2018-02-05: qty 2

## 2018-02-05 MED ORDER — METHOCARBAMOL 750 MG PO TABS: 750.0000 mg | ORAL_TABLET | Freq: Three times a day (TID) | ORAL | 0 refills | Status: DC | PRN

## 2018-02-05 MED ORDER — DEXAMETHASONE SODIUM PHOSPHATE 10 MG/ML IJ SOLN
10.0000 mg | Freq: Once | INTRAMUSCULAR | Status: AC
  Administered 2018-02-05: 10 mg via INTRAMUSCULAR
  Filled 2018-02-05: qty 1

## 2018-02-05 MED ORDER — METHOCARBAMOL 500 MG PO TABS
1000.0000 mg | ORAL_TABLET | Freq: Once | ORAL | Status: AC
  Administered 2018-02-05: 1000 mg via ORAL
  Filled 2018-02-05: qty 2

## 2018-02-05 MED ORDER — HYDROCODONE-ACETAMINOPHEN 5-325 MG PO TABS: 1.0000 | ORAL_TABLET | ORAL | 0 refills | Status: DC | PRN

## 2018-02-05 MED ORDER — PREDNISONE 20 MG PO TABS: ORAL_TABLET | ORAL | 0 refills | Status: DC

## 2018-02-05 NOTE — ED Provider Notes (Signed)
[facility] EMERGENCY DEPARTMENT Provider Note   CSN: [id number] Arrival date & time: 02/04/18  2315     History   Chief Complaint Chief Complaint  Patient presents with  . Back Pain    HPI Don CarnesSteven D Rezabek is a 47 y.o. male.  Patient presents to the ER for evaluation of low back pain.  Patient reports a long history of chronic low back pain after an injury when he was 47 years old.  Patient reports that he bent over several days ago to pick something up and felt sudden pain in his lower back.  Radiates to the right leg.  No change in bowel or bladder function.  No weakness, numbness or tingling in lower extremities.     Past Medical History:  Diagnosis Date  . Bipolar 1 disorder (HCC)   . CAD (coronary artery disease)    a. 12/16/13 inferolat STEMI s/p BMS to RPL. b. Cath 08/2014: interim occlusion of the PLA stent of the RCA, collateralized from the left coronary artery with diffuse nonobstructive CAD;  c. STEMI/PCI: LM nl, LAD nl, LCX 16420m/d (2.75x12 synergy DES), RCA 30p, RPDA 75ost/p, RPAV 100 w/ L->R collats.  . Chronic back pain   . HLD (hyperlipidemia)   . Hypertension   . Ischemic cardiomyopathy    a. 12/2013: EF 50-55% by echo with +WMA. b. EF 45% by cath in 08/2014.  . Ischemic cardiomyopathy 05/19/2016  . Peptic ulcer disease   . Schizophrenic disorder (HCC)   . Tobacco abuse     Patient Active Problem List   Diagnosis Date Noted  . Family hx of colon cancer 02/14/2017  . STEMI (ST elevation myocardial infarction) (HCC) 05/19/2016  . Acute ST elevation myocardial infarction (STEMI) involving left circumflex coronary artery without development of Q waves (HCC) 05/19/2016  . Ischemic cardiomyopathy 05/19/2016  . Unstable angina (HCC) 08/06/2014  . ST elevation myocardial infarction involving right coronary artery (HCC)   . CAD (coronary artery disease)   . Tobacco abuse   . HLD (hyperlipidemia)   . Hypertension   . Family history of premature CAD 12/16/2013    . CAD S/P PCI:  2.0 x 23 vision bare metal stent (2.25 mm) RPL 12/16/2013    Class: Acute  . Schizophrenic disorder (HCC)   . Bipolar 1 disorder [facility](HCC)     Past Surgical History:  Procedure Laterality Date  . CARDIAC CATHETERIZATION N/A 08/07/2014   Procedure: Left Heart Cath and Coronary Angiography;  Surgeon: Tonny BollmanMichael Cooper, MD;  [facility];  Service: Cardiovascular;  Laterality: N/A;  . COLONOSCOPY N/A 06/06/2017   Procedure: COLONOSCOPY;  Surgeon: Malissa Hippoehman, Najeeb U, MD;  Location: [facility] ENDO [facility];  Service: Endoscopy;  Laterality: N/A;  10:50  . CORONARY STENT INTERVENTION N/A 05/19/2016   Procedure: Coronary Stent Intervention;  Surgeon: Runell GessJonathan J Berry, MD;  Location: [facility] INVASIVE CV LAB;  Service: Cardiovascular;  Laterality: N/A;  . HERNIA REPAIR    . LEFT HEART CATH AND CORONARY ANGIOGRAPHY N/A 05/19/2016   Procedure: Left Heart Cath and Coronary Angiography;  Surgeon: Runell GessJonathan J Berry, MD;  [facility];  Service: Cardiovascular;  Laterality: N/A;  . LEFT HEART CATHETERIZATION WITH CORONARY ANGIOGRAM N/A 12/16/2013   Procedure: LEFT HEART CATHETERIZATION WITH CORONARY ANGIOGRAM;  Surgeon: Corky CraftsJayadeep S Varanasi, MD;  [facility];  Service: Cardiovascular;  Laterality: N/A;  . PERCUTANEOUS CORONARY STENT INTERVENTION (PCI-S)  12/16/2013   Procedure: PERCUTANEOUS CORONARY STENT INTERVENTION (PCI-S);  Surgeon: Donnie CoffinJayadeep S  Eldridge Dace, MD;  Location: [facility] CATH LAB;  Service: Cardiovascular;;  distal rca        Home Medications    Prior to Admission medications   Medication Sig Start Date End Date Taking? Authorizing Provider  aspirin EC 81 MG tablet Take 81 mg by mouth daily.    [provider]  atorvastatin (LIPITOR) 80 MG tablet Take 1 tablet (80 mg total) by mouth daily. 07/10/17   Antoine Poche, MD  carvedilol (COREG) 6.25 MG tablet Take 6.25 mg by mouth 2 (two) times daily with a meal.    [provider]   lisinopril-hydrochlorothiazide (PRINZIDE,ZESTORETIC) 20-12.5 MG tablet Take 1 tablet by mouth daily. 06/07/17   [provider]  meclizine (ANTIVERT) 25 MG tablet Take 1 tablet (25 mg total) by mouth 3 (three) times daily as needed for dizziness. 07/03/17   Antoine Poche, MD  methocarbamol (ROBAXIN) 750 MG tablet Take 1 tablet (750 mg total) by mouth every 8 (eight) hours as needed for muscle spasms. 02/05/18   Gilda Crease, MD  predniSONE (DELTASONE) 20 MG tablet 3 tabs po daily x 3 days, then 2 tabs x 3 days, then 1.5 tabs x 3 days, then 1 tab x 3 days, then 0.5 tabs x 3 days 02/05/18   Gilda Crease, MD    Family History Family History  Problem Relation Age of Onset  . Heart attack Father 44  . Heart attack Brother 32    Social History Social History   Tobacco Use  . Smoking status: Current Every Day Smoker    Packs/day: 1.00    Years: 25.00    Pack years: 25.00    Types: Cigars    Start date: 05/25/1988  . Smokeless tobacco: Former Neurosurgeon    Types: Snuff    Quit date: 05/21/1988  Substance Use Topics  . Alcohol use: No    Alcohol/week: 0.0 standard drinks  . Drug use: Yes    Types: Marijuana     Allergies   Suboxone [buprenorphine hcl-naloxone hcl]; Benadryl [diphenhydramine]; Seroquel [quetiapine]; and Tylenol pm extra [diphenhydramine-apap (sleep)]   Review of Systems Review of Systems  Musculoskeletal: Positive for back pain.  All other systems reviewed and are negative.    Physical Exam Updated Vital Signs BP (!) 195/93 (BP Location: Right Arm)   Pulse (!) 102   Temp 97.6 F (36.4 C) (Oral)   Resp 18   Ht 5\' 7"  (1.702 m)   Wt 81.6 kg   SpO2 99%   BMI 28.19 kg/m   Physical Exam Vitals signs and nursing note reviewed.  Constitutional:      General: He is not in acute distress.    Appearance: Normal appearance. He is well-developed.  HENT:     Head: Normocephalic and atraumatic.     Right Ear: Hearing normal.     Left  Ear: Hearing normal.     Nose: Nose normal.  Eyes:     Conjunctiva/sclera: Conjunctivae normal.     Pupils: Pupils are equal, round, and reactive to light.  Neck:     Musculoskeletal: Normal range of motion and neck supple.  Cardiovascular:     Rate and Rhythm: Regular rhythm.     Heart sounds: S1 normal and S2 normal. No murmur. No friction rub. No gallop.   Pulmonary:     Effort: Pulmonary effort is normal. No respiratory distress.     Breath sounds: Normal breath sounds.  Chest:     Chest wall:  No tenderness.  Abdominal:     General: Bowel sounds are normal.     Palpations: Abdomen is soft.     Tenderness: There is no abdominal tenderness. There is no guarding or rebound. Negative signs include Murphy's sign and McBurney's sign.     Hernia: No hernia is present.  Musculoskeletal: Normal range of motion.  Skin:    General: Skin is warm and dry.     Findings: No rash.  Neurological:     Mental Status: He is alert and oriented to person, place, and time.     GCS: GCS eye subscore is 4. GCS verbal subscore is 5. GCS motor subscore is 6.     Cranial Nerves: No cranial nerve deficit.     Sensory: No sensory deficit.     Coordination: Coordination normal.     Deep Tendon Reflexes:     Reflex Scores:      Patellar reflexes are 2+ on the right side and 2+ on the left side.    Comments: No foot drop  No saddle anesthesia  Normal strength bilateral lower extremities  Psychiatric:        Speech: Speech normal.        Behavior: Behavior normal.        Thought Content: Thought content normal.      ED Treatments / Results  Labs (all labs ordered are listed, but only abnormal results are displayed) Labs Reviewed - No data to display  EKG None  Radiology No results found.  Procedures Procedures (including critical care time)  Medications Ordered in ED Medications  ketorolac (TORADOL) injection 60 mg (has no administration in time range)  dexamethasone (DECADRON)  injection 10 mg (has no administration in time range)  oxyCODONE-acetaminophen (PERCOCET/ROXICET) 5-325 MG per tablet 1 tablet (has no administration in time range)  methocarbamol (ROBAXIN) tablet 1,000 mg (has no administration in time range)     Initial Impression / Assessment and Plan / ED Course  I have reviewed the triage vital signs and the nursing notes.  Pertinent labs & imaging results that were available during my care of the patient were reviewed by me and considered in my medical decision making (see chart for details).     Patient presents to the ER with musculoskeletal back pain. Examination reveals back tenderness without any associated neurologic findings. Patient's strength, sensation and reflexes were normal. There is no evidence of saddle anesthesia. Patient does not have a foot drop. Patient has not experienced any change in bowel or bladder function. As such, patient did not require any imaging or further studies. Patient was treated with analgesia.  Final Clinical Impressions(s) / ED Diagnoses   Final diagnoses:  Acute right-sided low back pain with right-sided sciatica    ED Discharge Orders         Ordered    methocarbamol (ROBAXIN) 750 MG tablet  Every 8 hours PRN     02/05/18 0028    predniSONE (DELTASONE) 20 MG tablet     02/05/18 0028           Gilda CreasePollina,  J, MD 02/05/18 0028

## 2018-02-07 MED FILL — Oxycodone w/ Acetaminophen Tab 5-325 MG: ORAL | Qty: 6 | Status: AC

## 2018-04-03 ENCOUNTER — Other Ambulatory Visit: Payer: Self-pay | Admitting: Cardiology

## 2018-04-03 MED ORDER — CARVEDILOL 6.25 MG PO TABS: 6.2500 mg | ORAL_TABLET | Freq: Two times a day (BID) | ORAL | 0 refills | Status: DC

## 2018-04-03 MED ORDER — ATORVASTATIN CALCIUM 80 MG PO TABS: 80.0000 mg | ORAL_TABLET | Freq: Every day | ORAL | 0 refills | Status: DC

## 2018-04-03 MED ORDER — LISINOPRIL-HYDROCHLOROTHIAZIDE 20-12.5 MG PO TABS: 1.0000 | ORAL_TABLET | Freq: Every day | ORAL | 0 refills | Status: DC

## 2018-04-03 NOTE — Telephone Encounter (Signed)
Scheduled pt appt with Dr Wyline Mood in April - will refill medications to last until that appt - pt says he thinks his BP is elevated from not taking medications

## 2018-04-03 NOTE — Telephone Encounter (Signed)
Patient called stating that approximately 3 weeks ago he misplaced his BP medication. States that he has been without for 3 weeks now. States that for the past few days he has not felt right.  Would like to get prescription called to [facility] pharmacy.

## 2018-04-05 NOTE — Congregational Nurse Program (Unsigned)
  Dept: [phone]   Congregational Nurse Program Note  Date of Encounter: 04/03/2018  Past Medical History: Past Medical History:  Diagnosis Date  . Bipolar 1 disorder (HCC)   . CAD (coronary artery disease)    a. 12/16/13 inferolat STEMI s/p BMS to RPL. b. Cath 08/2014: interim occlusion of the PLA stent of the RCA, collateralized from the left coronary artery with diffuse nonobstructive CAD;  c. STEMI/PCI: LM nl, LAD nl, LCX 135m/d (2.75x12 synergy DES), RCA 30p, RPDA 75ost/p, RPAV 100 w/ L->R collats.  . Chronic back pain   . HLD (hyperlipidemia)   . Hypertension   . Ischemic cardiomyopathy    a. 12/2013: EF 50-55% by echo with +WMA. b. EF 45% by cath in 08/2014.  . Ischemic cardiomyopathy 05/19/2016  . Peptic ulcer disease   . Schizophrenic disorder (HCC)   . Tobacco abuse     Encounter Details: CNP Questionnaire - 04/05/18 1023      Questionnaire   Patient Status  Not Applicable    Race  White or Caucasian    Location Patient Served At  [facility]    Uninsured  Not Applicable    Food  Yes, have food insecurities    Housing/Utilities  Yes, have permanent housing    Transportation  No transportation needs    Interpersonal Safety  Yes, feel physically and emotionally safe where you currently live    Medication  No medication insecurities    Medical Provider  Yes    Referrals  Other    ED Visit Averted  Yes    Life-Saving Intervention Made  Yes      Client was assisted today with BP readings, has hyertension and heart disease, history of MI.  RA- 199/115 P 87  LA- 186/103 P-95.  Client advised to call dr ( has cardioligist in [place]) ASAP about very elevated BPS.  Client had not taken medication takes it at night. But encouraged him to call dr to report this information. Advised to go to ER if having any symtoms or but advised to call dr. Client stated he will and will go by office (close by) with information.  Client has history of heart attacks and is aware of  symptoms.  Pearletha Alfred  [phone].

## 2018-04-07 IMAGING — CR DG CHEST 1V PORT
1 series · 1 of 1 positions shown · non-contrast
Comparison: Chest radiograph performed 05/16/2016

CLINICAL DATA: Acute onset of myocardial infarction. Initial
encounter.

EXAM:
PORTABLE CHEST 1 VIEW

[AP]
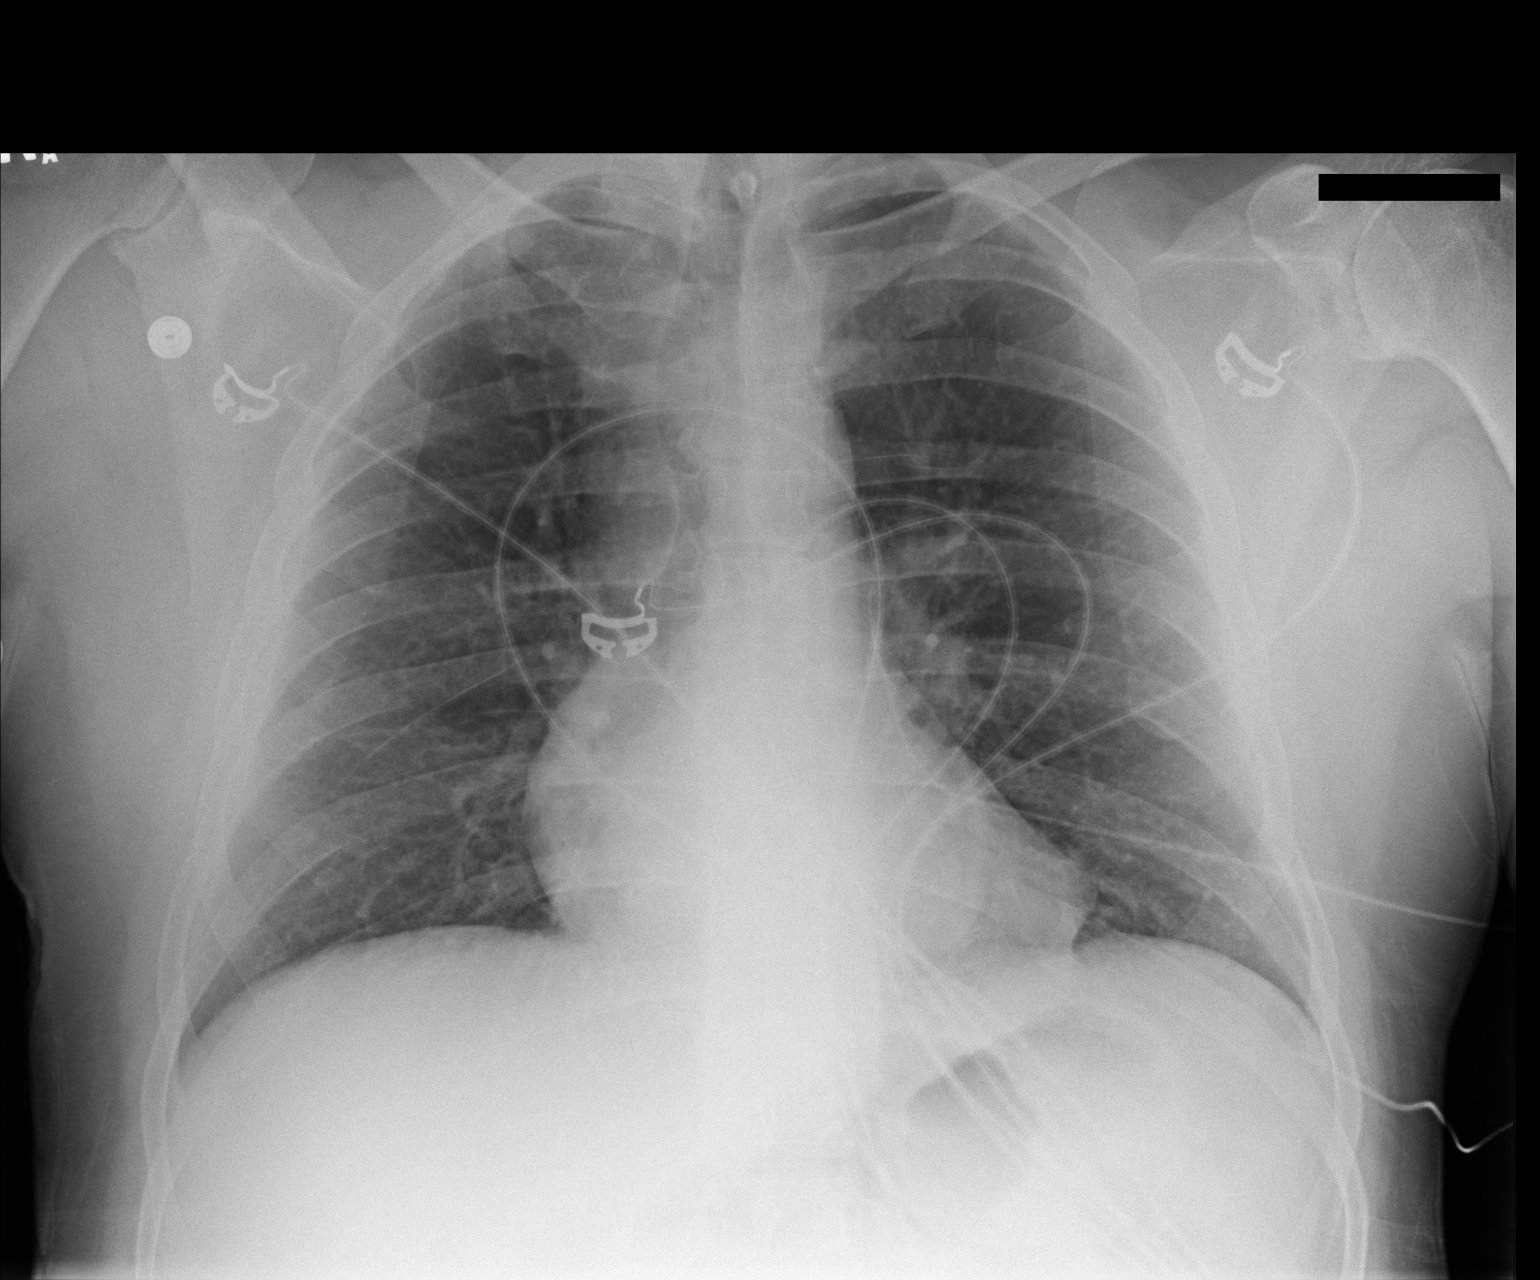

[1 of 1 positions shown; findings below may reference images not displayed]

FINDINGS: The lungs are well-aerated and clear. There is no evidence of focal
opacification, pleural effusion or pneumothorax.

The cardiomediastinal silhouette is within normal limits. No acute
osseous abnormalities are seen.
IMPRESSION: No acute cardiopulmonary process seen.

## 2018-05-29 ENCOUNTER — Telehealth: Payer: Self-pay | Admitting: *Deleted

## 2018-05-29 NOTE — Telephone Encounter (Signed)
Pt contacted per Dr Branch. History reviewed. No symptoms to suggest any unstable cardiac conditions. Based on discussion, with current pandemic situation, we have postponed 05/31/18 appointment until July 2020. If symptoms change, pt has been instructed to contact our office  

## 2018-05-31 ENCOUNTER — Ambulatory Visit: Payer: Medicaid Other | Admitting: Cardiology

## 2018-06-30 ENCOUNTER — Inpatient Hospital Stay (HOSPITAL_COMMUNITY)
Admission: AD | Admit: 2018-06-30 | Discharge: 2018-07-02 | DRG: 247 | Discharge status: Against Medical Advice | Payer: Medicaid Other | Source: Other Acute Inpatient Hospital | Attending: Cardiovascular Disease | Admitting: Cardiovascular Disease

## 2018-06-30 ENCOUNTER — Other Ambulatory Visit: Payer: Self-pay

## 2018-06-30 ENCOUNTER — Encounter (HOSPITAL_COMMUNITY): Payer: Self-pay | Admitting: *Deleted

## 2018-06-30 DIAGNOSIS — I214 Non-ST elevation (NSTEMI) myocardial infarction: Principal | ICD-10-CM | POA: Diagnosis present

## 2018-06-30 DIAGNOSIS — Z720 Tobacco use: Secondary | ICD-10-CM | POA: Diagnosis present

## 2018-06-30 DIAGNOSIS — I2510 Atherosclerotic heart disease of native coronary artery without angina pectoris: Secondary | ICD-10-CM | POA: Diagnosis present

## 2018-06-30 DIAGNOSIS — E785 Hyperlipidemia, unspecified: Secondary | ICD-10-CM | POA: Diagnosis present

## 2018-06-30 DIAGNOSIS — Z8711 Personal history of peptic ulcer disease: Secondary | ICD-10-CM | POA: Diagnosis not present

## 2018-06-30 DIAGNOSIS — Z79899 Other long term (current) drug therapy: Secondary | ICD-10-CM | POA: Diagnosis not present

## 2018-06-30 DIAGNOSIS — Z9114 Patient's other noncompliance with medication regimen: Secondary | ICD-10-CM

## 2018-06-30 DIAGNOSIS — F209 Schizophrenia, unspecified: Secondary | ICD-10-CM | POA: Diagnosis present

## 2018-06-30 DIAGNOSIS — G8929 Other chronic pain: Secondary | ICD-10-CM | POA: Diagnosis present

## 2018-06-30 DIAGNOSIS — F17210 Nicotine dependence, cigarettes, uncomplicated: Secondary | ICD-10-CM | POA: Diagnosis present

## 2018-06-30 DIAGNOSIS — I10 Essential (primary) hypertension: Secondary | ICD-10-CM | POA: Diagnosis present

## 2018-06-30 DIAGNOSIS — F319 Bipolar disorder, unspecified: Secondary | ICD-10-CM | POA: Diagnosis present

## 2018-06-30 DIAGNOSIS — M549 Dorsalgia, unspecified: Secondary | ICD-10-CM | POA: Diagnosis present

## 2018-06-30 DIAGNOSIS — I200 Unstable angina: Secondary | ICD-10-CM | POA: Diagnosis present

## 2018-06-30 DIAGNOSIS — I25110 Atherosclerotic heart disease of native coronary artery with unstable angina pectoris: Secondary | ICD-10-CM | POA: Diagnosis present

## 2018-06-30 DIAGNOSIS — Z8249 Family history of ischemic heart disease and other diseases of the circulatory system: Secondary | ICD-10-CM

## 2018-06-30 DIAGNOSIS — Z1159 Encounter for screening for other viral diseases: Secondary | ICD-10-CM | POA: Diagnosis not present

## 2018-06-30 DIAGNOSIS — Z7982 Long term (current) use of aspirin: Secondary | ICD-10-CM

## 2018-06-30 DIAGNOSIS — Z79891 Long term (current) use of opiate analgesic: Secondary | ICD-10-CM

## 2018-06-30 DIAGNOSIS — Z888 Allergy status to other drugs, medicaments and biological substances status: Secondary | ICD-10-CM

## 2018-06-30 DIAGNOSIS — I255 Ischemic cardiomyopathy: Secondary | ICD-10-CM | POA: Diagnosis present

## 2018-06-30 DIAGNOSIS — Z955 Presence of coronary angioplasty implant and graft: Secondary | ICD-10-CM

## 2018-06-30 DIAGNOSIS — E782 Mixed hyperlipidemia: Secondary | ICD-10-CM | POA: Diagnosis not present

## 2018-06-30 LAB — BASIC METABOLIC PANEL
Anion gap: 9 (ref 5–15)
BUN: 9 mg/dL (ref 6–20)
CO2: 21 mmol/L — ABNORMAL LOW (ref 22–32)
Calcium: 9 mg/dL (ref 8.9–10.3)
Chloride: 106 mmol/L (ref 98–111)
Creatinine, Ser: 0.87 mg/dL (ref 0.61–1.24)
GFR calc Af Amer: >60 mL/min (ref >60)
GFR calc non Af Amer: >60 mL/min (ref >60)
Glucose, Bld: 99 mg/dL (ref 70–99)
Potassium: 3.8 mmol/L (ref 3.5–5.1)
Sodium: 136 mmol/L (ref 135–145)

## 2018-06-30 LAB — TROPONIN I
Troponin I: 4.18 ng/mL (ref <0.03)
Troponin I: 6.56 ng/mL (ref <0.03)

## 2018-06-30 LAB — SARS CORONAVIRUS 2 BY RT PCR (HOSPITAL ORDER, PERFORMED IN ~~LOC~~ HOSPITAL LAB): SARS Coronavirus 2: NEGATIVE

## 2018-06-30 LAB — HEPARIN LEVEL (UNFRACTIONATED)
Heparin Unfractionated: 0.16 IU/mL — ABNORMAL LOW (ref 0.30–0.70)
Heparin Unfractionated: 0.4 IU/mL (ref 0.30–0.70)

## 2018-06-30 LAB — MAGNESIUM: Magnesium: 2.1 mg/dL (ref 1.7–2.4)

## 2018-06-30 MED ORDER — METHOCARBAMOL 750 MG PO TABS: 750.0000 mg | ORAL_TABLET | Freq: Three times a day (TID) | ORAL | Status: DC | PRN

## 2018-06-30 MED ORDER — SODIUM CHLORIDE 0.9% FLUSH
3.0000 mL | Freq: Two times a day (BID) | INTRAVENOUS | Status: DC
  Administered 2018-07-01: 22:00:00 3 mL via INTRAVENOUS

## 2018-06-30 MED ORDER — CARVEDILOL 6.25 MG PO TABS
6.2500 mg | ORAL_TABLET | Freq: Two times a day (BID) | ORAL | Status: DC
  Administered 2018-06-30 – 2018-07-02 (×5): 6.25 mg via ORAL
  Filled 2018-06-30 (×5): qty 1

## 2018-06-30 MED ORDER — HYDROCHLOROTHIAZIDE 12.5 MG PO CAPS
12.5000 mg | ORAL_CAPSULE | Freq: Every day | ORAL | Status: DC
  Administered 2018-06-30 – 2018-07-02 (×3): 12.5 mg via ORAL
  Filled 2018-06-30 (×3): qty 1

## 2018-06-30 MED ORDER — SODIUM CHLORIDE 0.9% FLUSH: 3.0000 mL | INTRAVENOUS | Status: DC | PRN

## 2018-06-30 MED ORDER — SODIUM CHLORIDE 0.9 % IV SOLN: 250.0000 mL | INTRAVENOUS | Status: DC | PRN

## 2018-06-30 MED ORDER — NITROGLYCERIN 0.4 MG SL SUBL: 0.4000 mg | SUBLINGUAL_TABLET | SUBLINGUAL | Status: DC | PRN

## 2018-06-30 MED ORDER — LISINOPRIL 20 MG PO TABS
20.0000 mg | ORAL_TABLET | Freq: Every day | ORAL | Status: DC
  Administered 2018-06-30 – 2018-07-02 (×3): 20 mg via ORAL
  Filled 2018-06-30 (×3): qty 1

## 2018-06-30 MED ORDER — SODIUM CHLORIDE 0.9% FLUSH: 3.0000 mL | Freq: Two times a day (BID) | INTRAVENOUS | Status: DC

## 2018-06-30 MED ORDER — ACETAMINOPHEN 325 MG PO TABS: 650.0000 mg | ORAL_TABLET | ORAL | Status: DC | PRN

## 2018-06-30 MED ORDER — ONDANSETRON HCL 4 MG/2ML IJ SOLN: 4.0000 mg | Freq: Four times a day (QID) | INTRAMUSCULAR | Status: DC | PRN

## 2018-06-30 MED ORDER — ASPIRIN EC 81 MG PO TBEC
81.0000 mg | DELAYED_RELEASE_TABLET | Freq: Every day | ORAL | Status: DC
  Administered 2018-06-30 – 2018-07-01 (×2): 81 mg via ORAL
  Filled 2018-06-30 (×2): qty 1

## 2018-06-30 MED ORDER — SODIUM CHLORIDE 0.9 % WEIGHT BASED INFUSION
3.0000 mL/kg/h | INTRAVENOUS | Status: DC
  Administered 2018-07-02: 04:00:00 3 mL/kg/h via INTRAVENOUS

## 2018-06-30 MED ORDER — ATORVASTATIN CALCIUM 80 MG PO TABS
80.0000 mg | ORAL_TABLET | Freq: Every day | ORAL | Status: DC
  Administered 2018-06-30 – 2018-07-02 (×3): 80 mg via ORAL
  Filled 2018-06-30 (×3): qty 1

## 2018-06-30 MED ORDER — HEPARIN (PORCINE) 25000 UT/250ML-% IV SOLN
1300.0000 [IU]/h | INTRAVENOUS | Status: DC
  Administered 2018-06-30: 13:00:00 1000 [IU]/h via INTRAVENOUS
  Administered 2018-07-02: 02:00:00 1300 [IU]/h via INTRAVENOUS
  Filled 2018-06-30 (×2): qty 250

## 2018-06-30 MED ORDER — SODIUM CHLORIDE 0.9 % WEIGHT BASED INFUSION
1.0000 mL/kg/h | INTRAVENOUS | Status: DC
  Administered 2018-07-02: 05:00:00 1 mL/kg/h via INTRAVENOUS

## 2018-06-30 MED ORDER — ASPIRIN 81 MG PO CHEW
81.0000 mg | CHEWABLE_TABLET | ORAL | Status: AC
  Administered 2018-07-02: 81 mg via ORAL
  Filled 2018-06-30: qty 1

## 2018-06-30 MED ORDER — LISINOPRIL-HYDROCHLOROTHIAZIDE 20-12.5 MG PO TABS: 1.0000 | ORAL_TABLET | Freq: Every day | ORAL | Status: DC

## 2018-06-30 NOTE — H&P (Signed)
Cardiology Admission History and Physical:   Patient ID: Don Fletcher MRN: [id number]; DOB: July 29, 1970   Admission date: 06/30/2018  Primary Care Provider: Avon Gully, MD Primary Cardiologist: Dina Rich, MD  Primary Electrophysiologist:  None   Chief Complaint:  Unstable angina   Patient Profile:   Don Fletcher is a 48 y.o. male with long history of coronary artery disease.  He status post multiple stent procedures.  [facility] angina and a mildly elevated troponin level.  History of Present Illness:   Don Fletcher is a 48 year old gentleman with a history of multiple stent placements in the past.  He is seen by Dr. Wyline Mood.  He woke up last night with symptoms of unstable angina.  His symptoms were very similar to his previous episodes of angina.  He did not have any nitroglycerin today.  He called EMS out to the house.  The EKG at that time did not reveal any acute changes and he did not go to the hospital.  Several hours later his wife took him to [facility].  He was transferred to [facility] for further management.  He is on heparin drip.  He is currently pain-free.  The patient smokes 1 pack of cigarettes a day and has for years.  He is previously used cocaine and methamphetamines.     Past Medical History:  Diagnosis Date  . Bipolar 1 disorder (HCC)   . CAD (coronary artery disease)    a. 12/16/13 inferolat STEMI s/p BMS to RPL. b. Cath 08/2014: interim occlusion of the PLA stent of the RCA, collateralized from the left coronary artery with diffuse nonobstructive CAD;  c. STEMI/PCI: LM nl, LAD nl, LCX 180m/d (2.75x12 synergy DES), RCA 30p, RPDA 75ost/p, RPAV 100 w/ L->R collats.  . Chronic back pain   . HLD (hyperlipidemia)   . Hypertension   . Ischemic cardiomyopathy    a. 12/2013: EF 50-55% by echo with +WMA. b. EF 45% by cath in 08/2014.  . Ischemic cardiomyopathy 05/19/2016  . Peptic  ulcer disease   . Schizophrenic disorder (HCC)   . Tobacco abuse     Past Surgical History:  Procedure Laterality Date  . CARDIAC CATHETERIZATION N/A 08/07/2014   Procedure: Left Heart Cath and Coronary Angiography;  Surgeon: Tonny Bollman, MD;  Location: [facility] INVASIVE CV LAB;  Service: Cardiovascular;  Laterality: N/A;  . COLONOSCOPY N/A 06/06/2017   Procedure: COLONOSCOPY;  Surgeon: Malissa Hippo, MD;  Location: [facility] END[facility];  Service: Endoscopy;  Laterality: N/A;  10:50  . CORONARY STENT INTERVENTION N/A 05/19/2016   Procedure: Coronary Stent Intervention;  Surgeon: Runell Gess, MD;  Location: [facility] INVASIVE CV LAB;  Service: Cardiovascular;  Laterality: N/A;  . HERNIA REPAIR    . LEFT HEART CATH AND CORONARY ANGIOGRAPHY N/A 05/19/2016   Procedure: Left Heart Cath and Coronary Angiography;  Surgeon: Runell Gess, MD;  Location[facility] INVASIVE CV LAB;  Service: Cardiovascular;  Laterality: N/A;  . LEFT HEART CATHETERIZATION WITH CORONARY ANGIOGRAM N/A 12/16/2013   Procedure: LEFT HEART CATHETERIZATION WITH CORONARY ANGIOGRAM;  Surgeon: Corky Crafts, MD;  Location: [facility] CATH LAB;  Service: Cardiovascular;  Laterality: N/A;  . PERCUTANEOUS CORONARY STENT INTERVENTION (PCI-S)  12/16/2013   Procedure: PERCUTANEOUS CORONARY STENT INTERVENTION (PCI-S);  Surgeon: Corky Crafts, MD;  Location: [facility] CATH LAB;  Service: Cardiovascular;;  distal rca     Medications Prior to Admission: Prior to Admission medications  Medication Sig Start Date End Date Taking? Authorizing Provider  aspirin EC 81 MG tablet Take 81 mg by mouth daily.    [provider]  atorvastatin (LIPITOR) 80 MG tablet Take 1 tablet (80 mg total) by mouth daily. 04/03/18   Antoine PocheBranch, Jonathan F, MD  carvedilol (COREG) 6.25 MG tablet Take 1 tablet (6.25 mg total) by mouth 2 (two) times daily with a meal. 04/03/18   Branch, Dorothe PeaJonathan F, MD  HYDROcodone-acetaminophen (NORCO/VICODIN) 5-325 MG tablet Take 1-2 tablets by  mouth every 4 (four) hours as needed. 02/05/18   Gilda CreasePollina, Christopher J, MD  lisinopril-hydrochlorothiazide (PRINZIDE,ZESTORETIC) 20-12.5 MG tablet Take 1 tablet by mouth daily. 04/03/18   Antoine PocheBranch, Jonathan F, MD  meclizine (ANTIVERT) 25 MG tablet Take 1 tablet (25 mg total) by mouth 3 (three) times daily as needed for dizziness. 07/03/17   Antoine PocheBranch, Jonathan F, MD  methocarbamol (ROBAXIN) 750 MG tablet Take 1 tablet (750 mg total) by mouth every 8 (eight) hours as needed for muscle spasms. 02/05/18   Gilda CreasePollina, Christopher J, MD  predniSONE (DELTASONE) 20 MG tablet 3 tabs po daily x 3 days, then 2 tabs x 3 days, then 1.5 tabs x 3 days, then 1 tab x 3 days, then 0.5 tabs x 3 days 02/05/18   Gilda CreasePollina, Christopher J, MD     Allergies:    Allergies  Allergen Reactions  . Suboxone [Buprenorphine Hcl-Naloxone Hcl] Other (See Comments)    Caused kidney failure  . Benadryl [Diphenhydramine] Other (See Comments)    LEG CRAMPING  . Seroquel [Quetiapine] Other (See Comments)    "makes my legs cramp"  . Tylenol Pm Extra [Diphenhydramine-Apap (Sleep)] Other (See Comments)    "makes my leg cramp"    Social History:   Social History   Socioeconomic History  . Marital status: Married    Spouse name: Not on file  . Number of children: Not on file  . Years of education: Not on file  . Highest education level: Not on file  Occupational History  . Not on file  Social Needs  . Financial resource strain: Not on file  . Food insecurity:    Worry: Not on file    Inability: Not on file  . Transportation needs:    Medical: Not on file    Non-medical: Not on file  Tobacco Use  . Smoking status: Current Every Day Smoker    Packs/day: 1.00    Years: 25.00    Pack years: 25.00    Types: Cigars    Start date: 05/25/1988  . Smokeless tobacco: Former NeurosurgeonUser    Types: Snuff    Quit date: 05/21/1988  Substance and Sexual Activity  . Alcohol use: No    Alcohol/week: 0.0 standard drinks  . Drug use: Yes     Types: Marijuana  . Sexual activity: Not on file  Lifestyle  . Physical activity:    Days per week: 0 days    Minutes per session: 0 min  . Stress: Rather much  Relationships  . Social connections:    Talks on phone: More than three times a week    Gets together: Never    Attends religious service: Never    Active member of club or organization: No    Attends meetings of clubs or organizations: Never    Relationship status: Married  . Intimate partner violence:    Fear of current or ex partner: No    Emotionally abused: No    Physically abused: No  Forced sexual activity: No  Other Topics Concern  . Not on file  Social History Narrative  . Not on file    Family History:   The patient's family history includes Heart attack (age of onset: 56) in his brother and father.    ROS:  Please see the history of present illness.  All other ROS reviewed and negative.     Physical Exam/Data:   Vitals:   06/30/18 0900 06/30/18 0953 06/30/18 1000  BP: 137/88 140/78   Pulse: 67 62 67  Resp: 14    Temp: 97.8 F (36.6 C) 97.8 F (36.6 C) 97.8 F (36.6 C)  TempSrc: Oral Oral Oral  SpO2:  98%   Weight: 80.8 kg 80.8 kg 80.8 kg  Height:  (1.702 m)  (1.702 m)  (1.702 m)   No intake or output data in the 24 hours ending 06/30/18 1128 Last 3 Weights 06/30/2018 06/30/2018 06/30/2018  Weight (lbs) 178 lb 3.2 oz 178 lb 3.2 oz 178 lb 3.2 oz  Weight (kg) 80.831 kg 80.831 kg 80.831 kg     Body mass index is 27.91 kg/m.  General:  Middle age male,  NAD pain free  HEENT: normal Lymph: no adenopathy Neck: no  JVD Endocrine:  No thryomegaly Vascular: No carotid bruits; FA pulses 2+ bilaterally without bruits  Cardiac:  normal S1, S2; RRR; no murmur   Lungs:  clear to auscultation bilaterally, no wheezing, rhonchi or rales  Abd: soft, nontender, no hepatomegaly  Ext: no  edema Musculoskeletal:  No deformities, BUE and BLE strength normal and equal Skin: warm and dry   Neuro:  CNs 2-12 intact, no focal abnormalities noted Psych:  Normal affect    EKG:    NSR,  No ST or T wave changes.   Relevant CV Studies:   Laboratory Data:  ChemistryNo results for input(s): NA, K, CL, CO2, GLUCOSE, BUN, CREATININE, CALCIUM, GFRNONAA, GFRAA, ANIONGAP in the last 168 hours.  No results for input(s): PROT, ALBUMIN, AST, ALT, ALKPHOS, BILITOT in the last 168 hours. HematologyNo results for input(s): WBC, RBC, HGB, HCT, MCV, MCH, MCHC, RDW, PLT in the last 168 hours. Cardiac EnzymesNo results for input(s): TROPONINI in the last 168 hours. No results for input(s): TROPIPOC in the last 168 hours.  BNPNo results for input(s): BNP, PROBNP in the last 168 hours.  DDimer No results for input(s): DDIMER in the last 168 hours.  Radiology/Studies:  No results found.  Assessment and Plan:   1. Unstable angina :  Woke up with symptoms that are c/w unstable angina .   Did not have NTG.   Wife eventually took [facility] [place] [facility] where he was found to have a minimally elevated troponin level.  He was transferred to [facility] health for further evaluation.  He is currently pain-free.  He remains on a heparin drip.  His symptoms were very similar to his previous episodes of angina.  We and I will anticipate heart catheterization tomorrow.  We discussed the risks, benefits, options of heart cath.  He understands and agrees to proceed.  He will need COVID-19 testing.  He will be able to eat a heart healthy diet today.  2.  Hyperlipidemia.  Last LDL recorded in the chart was 158.  His total cholesterol is 224.  HDL is 24.  His records from home indicate that he should be on atorvastatin 80 mg a day.  We will check a fasting lipid profile today. 3.  Hypertension: Home medications include carvedilol 6.25 mg twice a day, lisinopril HCT 20 mg - 12.5 mg a day. Continue current medications for now.  Severity of Illness: The appropriate patient status for this patient is  INPATIENT. Inpatient status is judged to be reasonable and necessary in order to provide the required intensity of service to ensure the patient's safety. The patient's presenting symptoms, physical exam findings, and initial radiographic and laboratory data in the context of their chronic comorbidities is felt to place them at high risk for further clinical deterioration. Furthermore, it is not anticipated that the patient will be medically stable for discharge from the hospital within 2 midnights of admission. The following factors support the patient status of inpatient.   " The patient's presenting symptoms include unstable angina . " The worrisome physical exam findings include . " The initial radiographic and laboratory data are worrisome because of . " The chronic co-morbidities include known hx of CAD.   * I certify that at the point of admission it is my clinical judgment that the patient will require inpatient hospital care spanning beyond 2 midnights from the point of admission due to high intensity of service, high risk for further deterioration and high frequency of surveillance required.*    For questions or updates, please contact [facility] Please consult [email or web address] for contact info under        Signed, Kristeen Miss, MD  06/30/2018 11:28 AM

## 2018-06-30 NOTE — Progress Notes (Signed)
Monitor tech called to notify of 6 beat run of VT.  Patient states no dizziness/CP.  BP 131/85.  Given coreg po as ordered per Dr. Elease Hashimoto.  NP Brion Aliment text paged to notify of VT 6 beats.

## 2018-06-30 NOTE — Progress Notes (Signed)
ANTICOAGULATION CONSULT NOTE - Initial Consult  Pharmacy Consult for Heparin Indication: chest pain/ACS  Allergies  Allergen Reactions  . Suboxone [Buprenorphine Hcl-Naloxone Hcl] Other (See Comments)    Caused kidney failure  . Benadryl [Diphenhydramine] Other (See Comments)    LEG CRAMPING  . Seroquel [Quetiapine] Other (See Comments)    "makes my legs cramp"  . Tylenol Pm Extra [Diphenhydramine-Apap (Sleep)] Other (See Comments)    "makes my leg cramp"    Patient Measurements: Height: 5\' 7"  (170.2 cm) Weight: 178 lb 3.2 oz (80.8 kg) IBW/kg (Calculated) : 66.1 Heparin Dosing Weight:  80.8 kg  Vital Signs: Temp: 97.5 F (36.4 C) (05/24 1335) Temp Source: Oral (05/24 1335) BP: 134/95 (05/24 1335) Pulse Rate: 67 (05/24 1000)  Labs: Recent Labs    06/30/18 1221 06/30/18 1408  HEPARINUNFRC  --  0.16*  CREATININE  --  0.87  TROPONINI 4.18*  --     Estimated Creatinine Clearance: 106.9 mL/min (by C-G formula based on SCr of 0.87 mg/dL).   Medical History: Past Medical History:  Diagnosis Date  . Bipolar 1 disorder (HCC)   . CAD (coronary artery disease)    a. 12/16/13 inferolat STEMI s/p BMS to RPL. b. Cath 08/2014: interim occlusion of the PLA stent of the RCA, collateralized from the left coronary artery with diffuse nonobstructive CAD;  c. STEMI/PCI: LM nl, LAD nl, LCX 165m/d (2.75x12 synergy DES), RCA 30p, RPDA 75ost/p, RPAV 100 w/ L->R collats.  . Chronic back pain   . HLD (hyperlipidemia)   . Hypertension   . Ischemic cardiomyopathy    a. 12/2013: EF 50-55% by echo with +WMA. b. EF 45% by cath in 08/2014.  . Ischemic cardiomyopathy 05/19/2016  . Peptic ulcer disease   . Schizophrenic disorder (HCC)   . Tobacco abuse    Assessment: 48 yoM transferred from [facility] and [facility] on IV heparin. Heparin level on admit low at 0.16, unclear of exact time but per RN upon report it was started ~0400 this morning.   Goal of Therapy:  Heparin level 0.3-0.7 units/ml Monitor  platelets by anticoagulation protocol: Yes   Plan:  -Increase heparin to 1300 units/hr -Recheck heparin later in 6hr   Fredonia Highland, PharmD, BCPS Clinical Pharmacist [phone] Please check AMION for all [facility] numbers 06/30/2018

## 2018-06-30 NOTE — Progress Notes (Signed)
Monitor tech called to notify of 4 beat run of VT.  Patient remains asymptomatic. BP 134/95.  NP Brion Aliment notified.

## 2018-06-30 NOTE — Progress Notes (Signed)
Patient arrived from [place], Dr. Allyson Sabal notified.  On heparin gtt at 1000 units/h.  Denies CP/SOB. Oriented to unit and advised will keep NPO until seen by Cardiologist.

## 2018-06-30 NOTE — Progress Notes (Signed)
ANTICOAGULATION CONSULT NOTE - Initial Consult  Pharmacy Consult for Heparin Indication: chest pain/ACS  Allergies  Allergen Reactions  . Suboxone [Buprenorphine Hcl-Naloxone Hcl] Other (See Comments)    Caused kidney failure  . Benadryl [Diphenhydramine] Other (See Comments)    LEG CRAMPING  . Seroquel [Quetiapine] Other (See Comments)    "makes my legs cramp"  . Tylenol Pm Extra [Diphenhydramine-Apap (Sleep)] Other (See Comments)    "makes my leg cramp"    Patient Measurements: Height: 5\' 7"  (170.2 cm) Weight: 178 lb 3.2 oz (80.8 kg) IBW/kg (Calculated) : 66.1 Heparin Dosing Weight:  80.8 kg  Vital Signs: Temp: 97.5 F (36.4 C) (05/24 1335) Temp Source: Oral (05/24 1335) BP: 134/95 (05/24 1335) Pulse Rate: 67 (05/24 1000)  Labs: Recent Labs    06/30/18 1221  TROPONINI 4.18*    CrCl cannot be calculated (Patient's most recent lab result is older than the maximum 21 days allowed.).   Medical History: Past Medical History:  Diagnosis Date  . Bipolar 1 disorder (HCC)   . CAD (coronary artery disease)    a. 12/16/13 inferolat STEMI s/p BMS to RPL. b. Cath 08/2014: interim occlusion of the PLA stent of the RCA, collateralized from the left coronary artery with diffuse nonobstructive CAD;  c. STEMI/PCI: LM nl, LAD nl, LCX 114m/d (2.75x12 synergy DES), RCA 30p, RPDA 75ost/p, RPAV 100 w/ L->R collats.  . Chronic back pain   . HLD (hyperlipidemia)   . Hypertension   . Ischemic cardiomyopathy    a. 12/2013: EF 50-55% by echo with +WMA. b. EF 45% by cath in 08/2014.  . Ischemic cardiomyopathy 05/19/2016  . Peptic ulcer disease   . Schizophrenic disorder (HCC)   . Tobacco abuse    Assessment: CC/HPI: Transfer from [facility], [facility], +troponin  PMH: CAD s/p multiple stents., tobacco 1 PPD, h/o PSA with cocaine and methamphetamines, THC. Bipolar1, schizophrenia, chronic back pain, HTN, HLD, ICM, PUD, anxiety, COPD, GERD, OA, DVT x 2,   Significant events:   [place] Labs: K 3.7, Scr 1.08, Mg 2.1, Alkphos 101 high, albumin 4, WBC 10.7, H/H 17.1/50.2, Plts 264  Anticoag: IV heparin from [facility] at 1000 units/hr started at unknown time (bolus unknown). Baseline labs above.  Goal of Therapy:  Heparin level 0.3-0.7 units/ml Monitor platelets by anticoagulation protocol: Yes   Plan:  Continue IV heparin at 1000 units/hr Check stat HL Daily HL and CBC  Nikan Ellingson S. Merilynn Finland, PharmD, BCPS Clinical Staff Pharmacist Misty Stanley Stillinger 06/30/2018,2:13 PM

## 2018-06-30 NOTE — Progress Notes (Signed)
ANTICOAGULATION CONSULT NOTE  Pharmacy Consult for Heparin Indication: chest pain/ACS  Allergies  Allergen Reactions  . Suboxone [Buprenorphine Hcl-Naloxone Hcl] Other (See Comments)    Caused kidney failure  . Benadryl [Diphenhydramine] Other (See Comments)    LEG CRAMPING  . Seroquel [Quetiapine] Other (See Comments)    "makes my legs cramp"  . Tylenol Pm Extra [Diphenhydramine-Apap (Sleep)] Other (See Comments)    "makes my leg cramp"    Patient Measurements: Height: 5\' 7"  (170.2 cm) Weight: 178 lb 3.2 oz (80.8 kg) IBW/kg (Calculated) : 66.1 Heparin Dosing Weight:  80.8 kg  Vital Signs: Temp: 97.6 F (36.4 C) (05/24 2145) Temp Source: Oral (05/24 2145) BP: 120/76 (05/24 2050) Pulse Rate: 73 (05/24 2145)  Labs: Recent Labs    06/30/18 1221 06/30/18 1408 06/30/18 1801 06/30/18 2204  HEPARINUNFRC  --  0.16*  --  0.40  CREATININE  --  0.87  --   --   TROPONINI 4.18*  --  6.56*  --     Estimated Creatinine Clearance: 106.9 mL/min (by C-G formula based on SCr of 0.87 mg/dL).   Assessment: 48 y.o. male with chest pain for heparin   Goal of Therapy:  Heparin level 0.3-0.7 units/ml Monitor platelets by anticoagulation protocol: Yes   Plan:  Continue Heparin at current rate   Geannie Risen, PharmD, BCPS  06/30/2018

## 2018-06-30 NOTE — Progress Notes (Signed)
Lab call to notify of troponin 4.18. NP Brion Aliment notified.

## 2018-07-01 ENCOUNTER — Encounter (HOSPITAL_COMMUNITY): Payer: Self-pay | Admitting: *Deleted

## 2018-07-01 DIAGNOSIS — E782 Mixed hyperlipidemia: Secondary | ICD-10-CM

## 2018-07-01 DIAGNOSIS — I214 Non-ST elevation (NSTEMI) myocardial infarction: Principal | ICD-10-CM

## 2018-07-01 LAB — CBC
HCT: 49.6 % (ref 39.0–52.0)
Hemoglobin: 17.3 g/dL — ABNORMAL HIGH (ref 13.0–17.0)
MCH: 31.6 pg (ref 26.0–34.0)
MCHC: 34.9 g/dL (ref 30.0–36.0)
MCV: 90.7 fL (ref 80.0–100.0)
Platelets: 212 10*3/uL (ref 150–400)
RBC: 5.47 MIL/uL (ref 4.22–5.81)
RDW: 13.3 % (ref 11.5–15.5)
WBC: 8.5 10*3/uL (ref 4.0–10.5)
nRBC: 0 % (ref 0.0–0.2)

## 2018-07-01 LAB — BASIC METABOLIC PANEL
Anion gap: 10 (ref 5–15)
BUN: 8 mg/dL (ref 6–20)
CO2: 21 mmol/L — ABNORMAL LOW (ref 22–32)
Calcium: 9.1 mg/dL (ref 8.9–10.3)
Chloride: 105 mmol/L (ref 98–111)
Creatinine, Ser: 0.92 mg/dL (ref 0.61–1.24)
GFR calc Af Amer: >60 mL/min (ref >60)
GFR calc non Af Amer: >60 mL/min (ref >60)
Glucose, Bld: 101 mg/dL — ABNORMAL HIGH (ref 70–99)
Potassium: 3.8 mmol/L (ref 3.5–5.1)
Sodium: 136 mmol/L (ref 135–145)

## 2018-07-01 LAB — LIPID PANEL
Cholesterol: 270 mg/dL — ABNORMAL HIGH (ref 0–200)
HDL: 27 mg/dL — ABNORMAL LOW (ref >40)
LDL Cholesterol: 210 mg/dL — ABNORMAL HIGH (ref 0–99)
Total CHOL/HDL Ratio: 10 RATIO
Triglycerides: 163 mg/dL — ABNORMAL HIGH (ref <150)
VLDL: 33 mg/dL (ref 0–40)

## 2018-07-01 LAB — TROPONIN I: Troponin I: 5.03 ng/mL (ref <0.03)

## 2018-07-01 LAB — HEPARIN LEVEL (UNFRACTIONATED): Heparin Unfractionated: 0.44 IU/mL (ref 0.30–0.70)

## 2018-07-01 NOTE — Progress Notes (Signed)
 Progress Note  Patient Name: Don Fletcher Date of Encounter: 07/01/2018  Primary Cardiologist: Branch, Jonathan, MD   Subjective   Don Fletcher is a 48 y.o. male with long history of coronary artery disease.  He status post multiple stent procedures.  [facility] and a mildly elevated troponin level.  Troponin peaked at 6.56,  Has decreased to 5 this am Feels well, no further angina Does not like the food Changed him to a regular diet for today and tomorrow Says he doesn't take his statin regularly    Inpatient Medications    Scheduled Meds: . [START ON 07/02/2018] aspirin  81 mg Oral Pre-Cath  . aspirin EC  81 mg Oral Daily  . atorvastatin  80 mg Oral Daily  . carvedilol  6.25 mg Oral BID WC  . lisinopril  20 mg Oral Daily   And  . hydrochlorothiazide  12.5 mg Oral Daily  . sodium chloride flush  3 mL Intravenous Q12H  . sodium chloride flush  3 mL Intravenous Q12H   Continuous Infusions: . sodium chloride    . sodium chloride    . [START ON 07/02/2018] sodium chloride     Followed by  . [START ON 07/02/2018] sodium chloride    . heparin 1,300 Units/hr (06/30/18 1635)   PRN Meds: sodium chloride, sodium chloride, acetaminophen, methocarbamol, nitroGLYCERIN, ondansetron (ZOFRAN) IV, sodium chloride flush, sodium chloride flush   Vital Signs    Vitals:   06/30/18 2050 06/30/18 2145 07/01/18 0649 07/01/18 0751  BP: 120/76  129/75 113/80  Pulse:  73 70 70  Resp:   12 12  Temp:  97.6 F (36.4 C) (!) 97.5 F (36.4 C)   TempSrc:  Oral Oral   SpO2:  97% 97%   Weight:      Height:        Intake/Output Summary (Last 24 hours) at 07/01/2018 0846 Last data filed at 06/30/2018 1800 Gross per 24 hour  Intake 453 ml  Output -  Net 453 ml   Last 3 Weights 06/30/2018 06/30/2018 06/30/2018  Weight (lbs) 178 lb 3.2 oz 178 lb 3.2 oz 178 lb 3.2 oz  Weight (kg) 80.831 kg 80.831 kg 80.831 kg      Telemetry    NSR  - Personally Reviewed  ECG     NSR  - Personally Reviewed  Physical Exam   GEN: No acute distress.   Neck: No JVD Cardiac: RRR, no murmurs, rubs, or gallops.  Respiratory: Clear to auscultation bilaterally. GI: Soft, nontender, non-distended  MS: No edema; No deformity. Neuro:  Nonfocal  Psych: Normal affect   Labs    Chemistry Recent Labs  Lab 06/30/18 1408 07/01/18 0018  NA 136 136  K 3.8 3.8  CL 106 105  CO2 21* 21*  GLUCOSE 99 101*  BUN 9 8  CREATININE 0.87 0.92  CALCIUM 9.0 9.1  GFRNONAA >60 >60  GFRAA >60 >60  ANIONGAP 9 10     Hematology Recent Labs  Lab 07/01/18 0018  WBC 8.5  RBC 5.47  HGB 17.3*  HCT 49.6  MCV 90.7  MCH 31.6  MCHC 34.9  RDW 13.3  PLT 212    Cardiac Enzymes Recent Labs  Lab 06/30/18 1221 06/30/18 1801 07/01/18 0018  TROPONINI 4.18* 6.56* 5.03*   No results for input(s): TROPIPOC in the last 168 hours.   BNPNo results for input(s): BNP, PROBNP in the last 168   hours.   DDimer No results for input(s): DDIMER in the last 168 hours.   Radiology    No results found.  Cardiac Studies     Patient Profile     48 y.o. male with CAD , admitted with NSTEMI  Assessment & Plan    1.  1.  Coronary artery disease: The patient has a history of coronary stenting.  [facility] with symptoms very similar to his previous episodes of chest pain.  His troponin has increased to 6.  He is now stable on IV heparin.  We have added him to the Schedule for tomorrow.  We discussed the risks, benefits, options of heart catheterization.  He understands and agrees to proceed. 2.  Hyperlipidemia: He admits that he does not take his statin consistently.  I stressed the absolute importance of cholesterol reduction to him. I have encouraged him to eat a low-cholesterol diet.  He does not like the food in the hospital.  We will change his diet to a regular diet for the next day or so but  have encouraged him to stick to a low-cholesterol low-fat diet when he gets home.    For questions or updates, please contact [facility] Please consult [email or web address] for contact info under        Signed, Kristeen Miss, MD  07/01/2018, 8:46 AM

## 2018-07-01 NOTE — Progress Notes (Signed)
ANTICOAGULATION CONSULT NOTE  Pharmacy Consult for Heparin Indication: chest pain/ACS  Allergies  Allergen Reactions  . Suboxone [Buprenorphine Hcl-Naloxone Hcl] Other (See Comments)    Caused kidney failure  . Benadryl [Diphenhydramine] Other (See Comments)    LEG CRAMPING  . Seroquel [Quetiapine] Other (See Comments)    "makes my legs cramp"  . Tylenol Pm Extra [Diphenhydramine-Apap (Sleep)] Other (See Comments)    "makes my leg cramp"    Patient Measurements: Height: 5\' 7"  (170.2 cm) Weight: 178 lb 3.2 oz (80.8 kg) IBW/kg (Calculated) : 66.1 Heparin Dosing Weight:  80.8 kg  Vital Signs: Temp: 97.5 F (36.4 C) (05/25 0649) Temp Source: Oral (05/25 0649) BP: 162/109 (05/25 0848) Pulse Rate: 74 (05/25 0848)  Labs: Recent Labs    06/30/18 1221 06/30/18 1408 06/30/18 1801 06/30/18 2204 07/01/18 0018 07/01/18 0519  HGB  --   --   --   --  17.3*  --   HCT  --   --   --   --  49.6  --   PLT  --   --   --   --  212  --   HEPARINUNFRC  --  0.16*  --  0.40  --  0.44  CREATININE  --  0.87  --   --  0.92  --   TROPONINI 4.18*  --  6.56*  --  5.03*  --     Estimated Creatinine Clearance: 101.1 mL/min (by C-G formula based on SCr of 0.92 mg/dL).   Assessment: Don Fletcher is a 48yo male transferred from [facility] for evaluation of unstable angina. Plan is for cath. Pharmacy consulted for heparin  5/25 Update: Heparin infusion currently therapeutic at 0.44 on 1300 units/hr. CBC stable with hgb 17.3 and pltc 212. No infusion issues noted. No changes today.  Goal of Therapy:  Heparin level 0.3-0.7 units/ml Monitor platelets by anticoagulation protocol: Yes   Plan:  Continue Heparin at 1300 units/hr Daily heparin level and CBC; monitor for s/sx of bleeding F/u plans for cath  Thank you for involving pharmacy in this patient's care.  Wendelyn Breslow, PharmD PGY1 Pharmacy Resident Phone: [phone] 07/01/2018 9:03 AM

## 2018-07-01 NOTE — H&P (View-Only) (Signed)
Progress Note  Patient Name: Tawni CarnesSteven D Gladhill Date of Encounter: 07/01/2018  Primary Cardiologist: Dina RichBranch, Jonathan, MD   Subjective   Tawni CarnesSteven D Mcglown is a 48 y.o. male with long history of coronary artery disease.  He status post multiple stent procedures.  [facility]ina and a mildly elevated troponin level.  Troponin peaked at 6.56,  Has decreased to 5 this am Feels well, no further angina Does not like the food Changed him to a regular diet for today and tomorrow Says he doesn't take his statin regularly    Inpatient Medications    Scheduled Meds: . [START ON 07/02/2018] aspirin  81 mg Oral Pre-Cath  . aspirin EC  81 mg Oral Daily  . atorvastatin  80 mg Oral Daily  . carvedilol  6.25 mg Oral BID WC  . lisinopril  20 mg Oral Daily   And  . hydrochlorothiazide  12.5 mg Oral Daily  . sodium chloride flush  3 mL Intravenous Q12H  . sodium chloride flush  3 mL Intravenous Q12H   Continuous Infusions: . sodium chloride    . sodium chloride    . [START ON 07/02/2018] sodium chloride     Followed by  . [START ON 07/02/2018] sodium chloride    . heparin 1,300 Units/hr (06/30/18 1635)   PRN Meds: sodium chloride, sodium chloride, acetaminophen, methocarbamol, nitroGLYCERIN, ondansetron (ZOFRAN) IV, sodium chloride flush, sodium chloride flush   Vital Signs    Vitals:   06/30/18 2050 06/30/18 2145 07/01/18 0649 07/01/18 0751  BP: 120/76  129/75 113/80  Pulse:  73 70 70  Resp:   12 12  Temp:  97.6 F (36.4 C) (!) 97.5 F (36.4 C)   TempSrc:  Oral Oral   SpO2:  97% 97%   Weight:      Height:        Intake/Output Summary (Last 24 hours) at 07/01/2018 0846 Last data filed at 06/30/2018 1800 Gross per 24 hour  Intake 453 ml  Output -  Net 453 ml   Last 3 Weights 06/30/2018 06/30/2018 06/30/2018  Weight (lbs) 178 lb 3.2 oz 178 lb 3.2 oz 178 lb 3.2 oz  Weight (kg) 80.831 kg 80.831 kg 80.831 kg      Telemetry    NSR  - Personally Reviewed  ECG     NSR  - Personally Reviewed  Physical Exam   GEN: No acute distress.   Neck: No JVD Cardiac: RRR, no murmurs, rubs, or gallops.  Respiratory: Clear to auscultation bilaterally. GI: Soft, nontender, non-distended  MS: No edema; No deformity. Neuro:  Nonfocal  Psych: Normal affect   Labs    Chemistry Recent Labs  Lab 06/30/18 1408 07/01/18 0018  NA 136 136  K 3.8 3.8  CL 106 105  CO2 21* 21*  GLUCOSE 99 101*  BUN 9 8  CREATININE 0.87 0.92  CALCIUM 9.0 9.1  GFRNONAA >60 >60  GFRAA >60 >60  ANIONGAP 9 10     Hematology Recent Labs  Lab 07/01/18 0018  WBC 8.5  RBC 5.47  HGB 17.3*  HCT 49.6  MCV 90.7  MCH 31.6  MCHC 34.9  RDW 13.3  PLT 212    Cardiac Enzymes Recent Labs  Lab 06/30/18 1221 06/30/18 1801 07/01/18 0018  TROPONINI 4.18* 6.56* 5.03*   No results for input(s): TROPIPOC in the last 168 hours.   BNPNo results for input(s): BNP, PROBNP in the last 168  hours.   DDimer No results for input(s): DDIMER in the last 168 hours.   Radiology    No results found.  Cardiac Studies     Patient Profile     48 y.o. male with CAD , admitted with NSTEMI  Assessment & Plan    1.  1.  Coronary artery disease: The patient has a history of coronary stenting.  [facility] with symptoms very similar to his previous episodes of chest pain.  His troponin has increased to 6.  He is now stable on IV heparin.  We have added him to the Schedule for tomorrow.  We discussed the risks, benefits, options of heart catheterization.  He understands and agrees to proceed. 2.  Hyperlipidemia: He admits that he does not take his statin consistently.  I stressed the absolute importance of cholesterol reduction to him. I have encouraged him to eat a low-cholesterol diet.  He does not like the food in the hospital.  We will change his diet to a regular diet for the next day or so but  have encouraged him to stick to a low-cholesterol low-fat diet when he gets home.    For questions or updates, please contact [facility] Please consult [email or web address] for contact info under        Signed, Kristeen Miss, MD  07/01/2018, 8:46 AM

## 2018-07-02 ENCOUNTER — Encounter (HOSPITAL_COMMUNITY)
Admission: AD | Discharge status: Against Medical Advice | Payer: Self-pay | Source: Other Acute Inpatient Hospital | Attending: Cardiovascular Disease

## 2018-07-02 ENCOUNTER — Encounter (HOSPITAL_COMMUNITY): Payer: Self-pay | Admitting: Interventional Cardiology

## 2018-07-02 DIAGNOSIS — I2510 Atherosclerotic heart disease of native coronary artery without angina pectoris: Secondary | ICD-10-CM

## 2018-07-02 HISTORY — PX: LEFT HEART CATH AND CORONARY ANGIOGRAPHY: CATH118249

## 2018-07-02 HISTORY — PX: CORONARY STENT INTERVENTION: CATH118234

## 2018-07-02 LAB — CBC
HCT: 52 % (ref 39.0–52.0)
HCT: 42.7 % (ref 39.0–52.0)
Hemoglobin: 18.1 g/dL — ABNORMAL HIGH (ref 13.0–17.0)
Hemoglobin: 14.4 g/dL (ref 13.0–17.0)
MCH: 31.6 pg (ref 26.0–34.0)
MCH: 31.2 pg (ref 26.0–34.0)
MCHC: 34.8 g/dL (ref 30.0–36.0)
MCHC: 33.7 g/dL (ref 30.0–36.0)
MCV: 90.8 fL (ref 80.0–100.0)
MCV: 92.4 fL (ref 80.0–100.0)
Platelets: 235 10*3/uL (ref 150–400)
Platelets: 212 10*3/uL (ref 150–400)
RBC: 5.73 MIL/uL (ref 4.22–5.81)
RBC: 4.62 MIL/uL (ref 4.22–5.81)
RDW: 13.2 % (ref 11.5–15.5)
RDW: 13.1 % (ref 11.5–15.5)
WBC: 8.5 10*3/uL (ref 4.0–10.5)
WBC: 7.2 10*3/uL (ref 4.0–10.5)
nRBC: 0 % (ref 0.0–0.2)
nRBC: 0 % (ref 0.0–0.2)

## 2018-07-02 LAB — POCT ACTIVATED CLOTTING TIME
Activated Clotting Time: 329 seconds
Activated Clotting Time: 384 seconds
Activated Clotting Time: 428 seconds

## 2018-07-02 LAB — CREATININE, SERUM
Creatinine, Ser: 0.8 mg/dL (ref 0.61–1.24)
GFR calc Af Amer: >60 mL/min (ref >60)
GFR calc non Af Amer: >60 mL/min (ref >60)

## 2018-07-02 LAB — HEPARIN LEVEL (UNFRACTIONATED): Heparin Unfractionated: 0.43 IU/mL (ref 0.30–0.70)

## 2018-07-02 SURGERY — LEFT HEART CATH AND CORONARY ANGIOGRAPHY
Anesthesia: LOCAL

## 2018-07-02 MED ORDER — SODIUM CHLORIDE 0.9% FLUSH: 3.0000 mL | INTRAVENOUS | Status: DC | PRN

## 2018-07-02 MED ORDER — TICAGRELOR 90 MG PO TABS
ORAL_TABLET | ORAL | Status: DC | PRN
  Administered 2018-07-02: 180 mg via ORAL

## 2018-07-02 MED ORDER — DIAZEPAM 5 MG PO TABS
5.0000 mg | ORAL_TABLET | Freq: Once | ORAL | Status: AC
  Administered 2018-07-02: 5 mg via ORAL
  Filled 2018-07-02: qty 1

## 2018-07-02 MED ORDER — NITROGLYCERIN 0.4 MG SL SUBL: 0.4000 mg | SUBLINGUAL_TABLET | SUBLINGUAL | 0 refills | Status: DC | PRN

## 2018-07-02 MED ORDER — LIDOCAINE HCL (PF) 1 % IJ SOLN
INTRAMUSCULAR | Status: DC | PRN
  Administered 2018-07-02: 2 mL

## 2018-07-02 MED ORDER — LIDOCAINE HCL (PF) 1 % IJ SOLN
INTRAMUSCULAR | Status: AC
  Filled 2018-07-02: qty 30

## 2018-07-02 MED ORDER — IOHEXOL 350 MG/ML SOLN
INTRAVENOUS | Status: DC | PRN
  Administered 2018-07-02: 11:00:00 165 mL via INTRACARDIAC

## 2018-07-02 MED ORDER — HEPARIN SODIUM (PORCINE) 1000 UNIT/ML IJ SOLN
INTRAMUSCULAR | Status: DC | PRN
  Administered 2018-07-02: 4000 [IU] via INTRAVENOUS
  Administered 2018-07-02: 6000 [IU] via INTRAVENOUS

## 2018-07-02 MED ORDER — TICAGRELOR 90 MG PO TABS: 90.0000 mg | ORAL_TABLET | Freq: Two times a day (BID) | ORAL | 0 refills | Status: DC

## 2018-07-02 MED ORDER — ASPIRIN 81 MG PO CHEW: 81.0000 mg | CHEWABLE_TABLET | Freq: Every day | ORAL | Status: DC

## 2018-07-02 MED ORDER — SODIUM CHLORIDE 0.9 % IV SOLN: 250.0000 mL | INTRAVENOUS | Status: DC | PRN

## 2018-07-02 MED ORDER — MIDAZOLAM HCL 2 MG/2ML IJ SOLN
INTRAMUSCULAR | Status: DC | PRN
  Administered 2018-07-02: 1 mg via INTRAVENOUS
  Administered 2018-07-02: 0.5 mg via INTRAVENOUS
  Administered 2018-07-02: 1 mg via INTRAVENOUS

## 2018-07-02 MED ORDER — SODIUM CHLORIDE 0.9 % IV SOLN
INTRAVENOUS | Status: AC | PRN
  Administered 2018-07-02: 250 mL via INTRAVENOUS

## 2018-07-02 MED ORDER — FENTANYL CITRATE (PF) 100 MCG/2ML IJ SOLN
INTRAMUSCULAR | Status: DC | PRN
  Administered 2018-07-02: 50 ug via INTRAVENOUS
  Administered 2018-07-02: 25 ug via INTRAVENOUS

## 2018-07-02 MED ORDER — VERAPAMIL HCL 2.5 MG/ML IV SOLN
INTRAVENOUS | Status: DC | PRN
  Administered 2018-07-02: 10 mL via INTRA_ARTERIAL

## 2018-07-02 MED ORDER — HEPARIN SODIUM (PORCINE) 5000 UNIT/ML IJ SOLN: 5000.0000 [IU] | Freq: Three times a day (TID) | INTRAMUSCULAR | Status: DC

## 2018-07-02 MED ORDER — NITROGLYCERIN 1 MG/10 ML FOR IR/CATH LAB
INTRA_ARTERIAL | Status: AC
  Filled 2018-07-02: qty 10

## 2018-07-02 MED ORDER — FENTANYL CITRATE (PF) 100 MCG/2ML IJ SOLN
INTRAMUSCULAR | Status: AC
  Filled 2018-07-02: qty 2

## 2018-07-02 MED ORDER — HEPARIN SODIUM (PORCINE) 1000 UNIT/ML IJ SOLN
INTRAMUSCULAR | Status: AC
  Filled 2018-07-02: qty 1

## 2018-07-02 MED ORDER — HYDRALAZINE HCL 20 MG/ML IJ SOLN: 10.0000 mg | INTRAMUSCULAR | Status: DC | PRN

## 2018-07-02 MED ORDER — ONDANSETRON HCL 4 MG/2ML IJ SOLN: 4.0000 mg | Freq: Four times a day (QID) | INTRAMUSCULAR | Status: DC | PRN

## 2018-07-02 MED ORDER — TICAGRELOR 90 MG PO TABS: 90.0000 mg | ORAL_TABLET | Freq: Two times a day (BID) | ORAL | Status: DC

## 2018-07-02 MED ORDER — LABETALOL HCL 5 MG/ML IV SOLN: 10.0000 mg | INTRAVENOUS | Status: DC | PRN

## 2018-07-02 MED ORDER — SODIUM CHLORIDE 0.9 % IV SOLN: INTRAVENOUS | Status: AC

## 2018-07-02 MED ORDER — SODIUM CHLORIDE 0.9% FLUSH: 3.0000 mL | Freq: Two times a day (BID) | INTRAVENOUS | Status: DC

## 2018-07-02 MED ORDER — ACETAMINOPHEN 325 MG PO TABS: 650.0000 mg | ORAL_TABLET | ORAL | Status: DC | PRN

## 2018-07-02 MED ORDER — HEPARIN (PORCINE) IN NACL 1000-0.9 UT/500ML-% IV SOLN
INTRAVENOUS | Status: AC
  Filled 2018-07-02: qty 1000

## 2018-07-02 MED ORDER — MIDAZOLAM HCL 2 MG/2ML IJ SOLN
INTRAMUSCULAR | Status: AC
  Filled 2018-07-02: qty 2

## 2018-07-02 MED ORDER — TICAGRELOR 90 MG PO TABS
ORAL_TABLET | ORAL | Status: AC
  Filled 2018-07-02: qty 2

## 2018-07-02 MED ORDER — NITROGLYCERIN 1 MG/10 ML FOR IR/CATH LAB
INTRA_ARTERIAL | Status: DC | PRN
  Administered 2018-07-02: 200 ug via INTRACORONARY
  Administered 2018-07-02: 100 ug via INTRACORONARY

## 2018-07-02 MED ORDER — HEPARIN (PORCINE) IN NACL 1000-0.9 UT/500ML-% IV SOLN
INTRAVENOUS | Status: DC | PRN
  Administered 2018-07-02 (×2): 500 mL

## 2018-07-02 MED FILL — NITROGLYCERIN 0.4 MG TAB SL: 0.4 | 8 days supply | Qty: 25 | Fill #0

## 2018-07-02 MED FILL — BRILINTA 90 MG TABLET: 90 | 30 days supply | Qty: 60 | Fill #0

## 2018-07-02 SURGICAL SUPPLY — 18 items
BALLN SAPPHIRE 2.0X12 (BALLOONS) ×2
BALLN SAPPHIRE ~~LOC~~ 2.25X15 (BALLOONS) ×1 IMPLANT
BALLOON SAPPHIRE 2.0X12 (BALLOONS) IMPLANT
CATH 5FR JL3.5 JR4 ANG PIG MP (CATHETERS) ×1 IMPLANT
CATH VISTA GUIDE 6FR JR4 (CATHETERS) ×1 IMPLANT
DEVICE RAD COMP TR BAND LRG (VASCULAR PRODUCTS) ×1 IMPLANT
GLIDESHEATH SLEND A-KIT 6F 22G (SHEATH) ×1 IMPLANT
GUIDEWIRE INQWIRE 1.5J.035X260 (WIRE) IMPLANT
INQWIRE 1.5J .035X260CM (WIRE) ×2
KIT ENCORE 26 ADVANTAGE (KITS) ×1 IMPLANT
KIT HEART LEFT (KITS) ×2 IMPLANT
PACK CARDIAC CATHETERIZATION (CUSTOM PROCEDURE TRAY) ×2 IMPLANT
SHEATH PROBE COVER 6X72 (BAG) ×1 IMPLANT
STENT RESOLUTE ONYX 2.0X18 (Permanent Stent) ×1 IMPLANT
STENT RESOLUTE ONYX 2.25X8 (Permanent Stent) ×1 IMPLANT
TRANSDUCER W/STOPCOCK (MISCELLANEOUS) ×2 IMPLANT
TUBING CIL FLEX 10 FLL-RA (TUBING) ×2 IMPLANT
WIRE ASAHI PROWATER 180CM (WIRE) ×1 IMPLANT

## 2018-07-02 NOTE — Progress Notes (Signed)
1320-1400 Discussed with pt the importance of brililnta with stent. Needs to see case manager. Discussed NTG use, MI restrictions, smoking cessation and handout given, ex ed and CRP 2. Pt encouraged to call [email or web address] as needed. Pt stated he is smoking cigars now. Do not know if pt plans to quit or not. Discussed weighing daily and calling MD with 3 lbs in one day or 5 in a week. Discussed 2000 mg sodium restrictions. Discussed walking for ex. Referred to [place] CRP 2. Pt stated he does not want to attend as he does not like groups. Prefers to walk on his own. Low sodium and heart healthy diets given.  Pt voiced understanding. Luetta Nutting RN BSN 07/02/2018 2:04 PM

## 2018-07-02 NOTE — Progress Notes (Addendum)
Progress Note  Patient Name: Don Fletcher Date of Encounter: 07/02/2018  Primary Cardiologist: Dr. Wyline Mood   Subjective   Denies chest pain. Ready to leave this AM. Attempted to leave AMA however reports that he will comply with antiplatelet therapy for up to one year per discussion this morning.   Inpatient Medications    Scheduled Meds: . aspirin EC  81 mg Oral Daily  . atorvastatin  80 mg Oral Daily  . carvedilol  6.25 mg Oral BID WC  . lisinopril  20 mg Oral Daily   And  . hydrochlorothiazide  12.5 mg Oral Daily  . sodium chloride flush  3 mL Intravenous Q12H  . sodium chloride flush  3 mL Intravenous Q12H   Continuous Infusions: . sodium chloride    . sodium chloride    . sodium chloride 1 mL/kg/hr (07/02/18 0504)  . heparin 1,300 Units/hr (07/02/18 0132)   PRN Meds: sodium chloride, sodium chloride, acetaminophen, methocarbamol, nitroGLYCERIN, ondansetron (ZOFRAN) IV, sodium chloride flush, sodium chloride flush   Vital Signs    Vitals:   07/01/18 1423 07/01/18 1603 07/01/18 2050 07/02/18 0439  BP: (!) 129/94 130/81 106/88 (!) 136/94  Pulse: 65 73 78 69  Resp: 18  16 20   Temp: (!) 97.4 F (36.3 C)  98.2 F (36.8 C) (!) 97.5 F (36.4 C)  TempSrc: Oral   Oral  SpO2: 97%  95% 97%  Weight:    79.3 kg  Height:        Intake/Output Summary (Last 24 hours) at 07/02/2018 0841 Last data filed at 07/02/2018 0600 Gross per 24 hour  Intake 1505.97 ml  Output -  Net 1505.97 ml   Filed Weights   06/30/18 0953 06/30/18 1000 07/02/18 0439  Weight: 80.8 kg 80.8 kg 79.3 kg    Physical Exam   General: Well developed, well nourished, NAD Skin: Warm, dry, intact  Head: Normocephalic, atraumatic, clear, moist mucus membranes. Neck: Negative for carotid bruits. No JVD Lungs:Clear to ausculation bilaterally. No wheezes, rales, or rhonchi. Breathing is unlabored. Cardiovascular: RRR with S1 S2. No murmurs, rubs, gallops, or LV heave appreciated. Extremities: No  edema. No clubbing or cyanosis. DP/PT pulses 2+ bilaterally Neuro: Alert and oriented. No focal deficits. No facial asymmetry. MAE spontaneously. Psych: Responds to questions appropriately with normal affect.    Labs    Chemistry Recent Labs  Lab 06/30/18 1408 07/01/18 0018  NA 136 136  K 3.8 3.8  CL 106 105  CO2 21* 21*  GLUCOSE 99 101*  BUN 9 8  CREATININE 0.87 0.92  CALCIUM 9.0 9.1  GFRNONAA >60 >60  GFRAA >60 >60  ANIONGAP 9 10     Hematology Recent Labs  Lab 07/01/18 0018 07/02/18 0435  WBC 8.5 8.5  RBC 5.47 5.73  HGB 17.3* 18.1*  HCT 49.6 52.0  MCV 90.7 90.8  MCH 31.6 31.6  MCHC 34.9 34.8  RDW 13.3 13.2  PLT 212 235   Cardiac Enzymes Recent Labs  Lab 06/30/18 1221 06/30/18 1801 07/01/18 0018  TROPONINI 4.18* 6.56* 5.03*   No results for input(s): TROPIPOC in the last 168 hours.   BNPNo results for input(s): BNP, PROBNP in the last 168 hours.   DDimer No results for input(s): DDIMER in the last 168 hours.   Radiology    No results found.  Telemetry    07/02/2018 NSR - Personally Reviewed  ECG    07/01/2018 NSR and no acute changes- Personally Reviewed  Cardiac Studies  Echocardiogram 07/19/2017: Study Conclusions  - Left ventricle: The cavity size was normal. Wall thickness was   normal. The estimated ejection fraction was 55%. There is   hypokinesis of the basal-midinferolateral and inferior   myocardium. Left ventricular diastolic function parameters were   normal for the patient&'s age. - Aortic valve: Mildly calcified annulus. Trileaflet. - Mitral valve: There was trivial regurgitation. - Right atrium: Central venous pressure (est): 3 mm Hg. - Atrial septum: No defect or patent foramen ovale was identified. - Tricuspid valve: There was trivial regurgitation. - Pulmonary arteries: PA peak pressure: 14 mm Hg (S). - Pericardium, extracardiac: There was no pericardial effusion.  Cath 05/19/2016:   Prox RCA lesion, 30  %stenosed.  Post Atrio lesion, 100 %stenosed.  Ost RPDA to RPDA lesion, 75 %stenosed.  Mid Cx to Dist Cx lesion, 100 %stenosed.  Post intervention, there is a 0% residual stenosis.  A stent was successfully placed.  There is moderate to severe left ventricular systolic dysfunction.  LV end diastolic pressure is mildly elevated.  The left ventricular ejection fraction is 35-45% by visual estimate.   IMPRESSION:Successful PCI and drug-eluting stenting synergy drug-eluting stents of an occluded circumflex in the setting of an inferolateral STEMI with a door to balloon time of 30 minutes. The patient's previously placed posterior lateral branch in the RCA was occluded as previously demonstrated by Dr. Excell Seltzerooper 08/27/14 with left-to-right collaterals. The EF was in the 35-40% range with inferobasal and posterolateral hypokinesia.  A 2-D echo will be obtained. The patient will be treated with standard post STEMI pharmacology including high-dose statin, beta blocker, dual antiplatelet therapy and ACE inhibitor inhibitor. Angiomax will continue full dose for 4 hours. Cardiac risk factor modification will be emphasized including smoking cessation.    Cath 07/021/2016:  FINAL CONCLUSIONS:  SINGLE VESSEL CAD WITH OCCLUSION OF THE PLA STENT IN THE RIGHT CORONARY, COLLATERALIZED FROM THE LEFT CORONARY ARTERY  DIFFUSE NONOBSTRUCTIVE CAD  MILD SEGMENTAL LV DYSFUNCTION WITH INFERIOR AKINESIS AND ESTIMATED LVEF 45%  RECOMMENDATIONS:  MEDICAL THERAPY  OK FOR DISCHARGE HOME TODAY   Patient Profile     48 y.o. male long history of coronary artery disease.  He status post multiple stent procedures.  [facility] angina and a mildly elevated troponin level.  Assessment & Plan    1. NSTEMI: -Presented form [facility] with unstable angina and elevated troponin  -Plan for cath today for further evaluation -Wants to leave>>attempted to leave AMA  -Worry about medication compliance>>long discussion about compliance with antiplatelet therapies and the subsequent risks of noncompliance including restenosis or death -Trop, 5.03>>peak 6.56 -Denies chest pain this AM  -Continue ASA, statin, BB  2. HLD: -Uncontrolled, LDL noted to be 210 on 07/01/2018 -Discussed compliance with statin on hospital discharge>>resume atorvastatin 80 QD -Will need further diet and exercise modifications  3. Tobacco Use: -Cessation strongly encouraged however patient reports no readiness for quitting at this time   4. HTN: -Non compliant with home antihypertensives which include carvedilol 6.25mg , lisinopril 20, HCTZ 12.5 -136/94>106/88>130/81 -Will make case management referral for medication assistance     Signed, Georgie ChardJill McDaniel NP-C [facility] Pager: [phone] 07/02/2018, 8:41 AM     For questions or updates, please contact   Please consult [email or web address] for contact info under Cardiology/STEMI.  Patient for cardiac catheterization.  Left floor prior to my evaluation.  Will need to review compliance with medications prior to discharge. Olga MillersBrian Demita Tobia, MD

## 2018-07-02 NOTE — CV Procedure (Signed)
   PCI of PDA following coronary angiography using right radial catheter by real-time vascular ultrasound.  Previously placed overlapping circumflex marginal stent is widely patent.  Diffuse disease throughout the LAD but without significant high-grade obstruction.  Diffuse disease throughout the right coronary with total occlusion of the continuation of the RCA with an previously placed stents.  99% stenosis in the proximal PDA within a diffusely diseased segment.  Overlapping stents from PDA mid segment back into the distal RCA.  The most proximal stent was 2.25 mm in diameter deployed at high pressure.  The PDA stent is 2.0 with overlapping postdilatation with a 2.25 mm balloon.  0% stenosis was noted.  Loaded with Brilinta in room.

## 2018-07-02 NOTE — Discharge Summary (Addendum)
Discharge Summary    Patient ID: Don Fletcher,  MRN: [id number], DOB/AGE: 1970-06-08 48 y.o.  Admit date: 06/30/2018 Discharge date: 07/02/2018  Primary Care Provider: Avon Gully Primary Cardiologist: Dina Rich, MD  Discharge Diagnoses    Principal Problem:   NSTEMI (non-ST elevated myocardial infarction) [facility]) Active Problems:   CAD (coronary artery disease)   Tobacco abuse   HLD (hyperlipidemia)   Unstable angina (HCC)  Allergies Allergies  Allergen Reactions  . Suboxone [Buprenorphine Hcl-Naloxone Hcl] Other (See Comments)    Caused kidney failure  . Benadryl [Diphenhydramine] Other (See Comments)    LEG CRAMPING  . Seroquel [Quetiapine] Other (See Comments)    "makes my legs cramp"  . Tylenol Pm Extra [Diphenhydramine-Apap (Sleep)] Other (See Comments)    "makes my leg cramp"   Diagnostic Studies/Procedures   Cardiac catheterization 07/02/2018:   Widely patent left main  Diffuse 20 to 30% LAD disease.  Change from 2018.  Circumflex stent previously placed in an overlapping fashion is widely patent.  Unchanged from 2018.  Moderate diffuse disease proximal to distal RCA up to 50%.  Distal RCA after the origin of the PDA is totally occluded in a previously stented segment.  The PDA shows progression to a 99% thrombus filled stenosis at the site of the previously contained 75% narrowing.  Successful PCI with overlapping stents in the distal RCA extending into the PDA and in the proximal to mid PDA reducing 99% stenosis to 0% with TIMI grade III flow postdilated to 2.25 mm in diameter at high pressure.  TIMI grade III restored.  RECOMMENDATIONS:   Aggressive risk factor modification.  Smoking cessation.  Dual antiplatelet therapy, initially aspirin and Brilinta, and can consider decreasing intensity by switching to Plavix and aspirin after 30 days.   History of Present Illness     Don Fletcher is a 48 y.o. male with long history of  coronary artery disease and multiple PCI's dating back to 2015. He was admitted in transfer from [facility] with unstable angina and elevated troponin level.   Don Fletcher is followed by Dr. Wyline Mood in the OP setting. He woke up one night prior to presentation with symptoms of unstable angina. His symptoms were very similar to his previous episodes of angina. He did not have any nitroglycerin to take. Given his symptoms, he called EMS. EKG at that time did not reveal any acute changes and he did not go to the hospital.  Several hours later his wife took him to [facility] given his persistent symptoms. He was transferred to [facility] for further management.  He was placed on Hep gtt and was pain free on transport.   He was scheduled for cath on Monday 07/02/2018. On the morning of his cath, he was attempting to leave AMA without further coronary assessment. Initially he was refusing to take his prescribed medications however, after further discussion, he reported that he would at least take his medications for one year. Case was discussed with Dr. Jens Som and the patient. He then proceeded to cardiac cath as planned.   Compliance was discussed at length. Risks of noncompliance with antiplatelet therapy was discussed including in-stent restenosis and death. He is currently pain-free.  He continues to smoke 1 pack of cigarettes a day as well as marijuana. He is previously used cocaine and methamphetamines. He has been noncompliant with statin therapy as well as antihypertensives in the past. His LDL is 210. Peak troponin 6.56. He  is pain free   Hospital Course    On 07/02/2018 the patient underwent cardiac catheterization procedure which showed widely patent left main, diffuse 20 to 30% LAD disease. Change from 2018. Circumflex stent previously placed in an overlapping fashion is widely patent. Unchanged from 2018. Moderate diffuse disease proximal to distal RCA up to 50%. Distal  RCA after the origin of the PDA is totally occluded in a previously stented segment.  The PDA shows progression to a 99% thrombus filled stenosis at the site of the previously contained 75% narrowing. Successful PCI with overlapping stents in the distal RCA extending into the PDA and in the proximal to mid PDA reducing 99% stenosis to 0%.   Recommendations are for DAPT with ASA and Brilinta for at least 1 month and then can consider changing to Plavix and ASA after 30 days. As stated above, a long discussion regarding medication compliance was complete. He understands the risks of not taking his medications and wishes to go home at this time. We have discussed staying overnight to monitor him closely and to help set him up with outside hospital assistance and he is currently firm about going home tonight. His cath site is stable without hematoma. He has ambulated without anginal symptom with cardiac rehab. We have sent his medications to the [facility] pharmacy so that he will have Brilinta available for 30 days at the very least. We have provided post cath instructions and reviewed them as well as ED precautions for return. We will set him up for close follow up with Dr. Wyline Mood or APP.   Consultants: None   Case discussed with Dr.Crenshaw who has also discussed the risks of non-compliance including re-stenosis and even death. The patient understands the risk of leaving against medical advise and is choosing to proceed. Instructions given as above.   Discharge Vitals Blood pressure (!) 141/78, pulse 67, temperature (!) 97.5 F (36.4 C), temperature source Oral, resp. rate 14, height 5\' 7"  (1.702 m), weight 79.3 kg, SpO2 97 %.  Filed Weights   06/30/18 0953 06/30/18 1000 07/02/18 0439  Weight: 80.8 kg 80.8 kg 79.3 kg   Labs & Radiologic Studies    CBC Recent Labs    07/02/18 0435 07/02/18 1209  WBC 8.5 7.2  HGB 18.1* 14.4  HCT 52.0 42.7  MCV 90.8 92.4  PLT 235 212   Basic Metabolic Panel Recent  Labs    06/30/18 1408 07/01/18 0018 07/02/18 1209  NA 136 136  --   K 3.8 3.8  --   CL 106 105  --   CO2 21* 21*  --   GLUCOSE 99 101*  --   BUN 9 8  --   CREATININE 0.87 0.92 0.80  CALCIUM 9.0 9.1  --   MG 2.1  --   --    Cardiac Enzymes Recent Labs    06/30/18 1221 06/30/18 1801 07/01/18 0018  TROPONINI 4.18* 6.56* 5.03*   Fasting Lipid Panel Recent Labs    07/01/18 0018  CHOL 270*  HDL 27*  LDLCALC 210*  TRIG 163*  CHOLHDL 10.0   Disposition   Pt is being discharged home today in good condition.  Follow-up Plans & Appointments    Follow-up Information    Ellsworth Lennox, PA-C Follow up on 07/16/2018.   Specialties:  Physician Assistant, Cardiology Why:  at 230 for telephone visit Contact information: [address] [place] [address] [phone]          Discharge  Instructions    Amb Referral to Cardiac Rehabilitation   Complete by:  As directed    Diagnosis:   NSTEMI Coronary Stents     After initial evaluation and assessments completed: Virtual Based Care may be provided alone or in conjunction with Phase 2 Cardiac Rehab based on patient barriers.:  Yes      Discharge Medications   Allergies as of 07/02/2018      Reactions   Suboxone [buprenorphine Hcl-naloxone Hcl] Other (See Comments)   Caused kidney failure   Benadryl [diphenhydramine] Other (See Comments)   LEG CRAMPING   Seroquel [quetiapine] Other (See Comments)   "makes my legs cramp"   Tylenol Pm Extra [diphenhydramine-apap (sleep)] Other (See Comments)   "makes my leg cramp"      Medication List    STOP taking these medications   HYDROcodone-acetaminophen 5-325 MG tablet Commonly known as:  NORCO/VICODIN   meclizine 25 MG tablet Commonly known as:  ANTIVERT   methocarbamol 750 MG tablet Commonly known as:  ROBAXIN   predniSONE 20 MG tablet Commonly known as:  DELTASONE     TAKE these medications   aspirin EC 81 MG tablet Take 81 mg by mouth daily.    atorvastatin 80 MG tablet Commonly known as:  LIPITOR Take 1 tablet (80 mg total) by mouth daily.   carvedilol 6.25 MG tablet Commonly known as:  COREG Take 1 tablet (6.25 mg total) by mouth 2 (two) times daily with a meal.   lisinopril-hydrochlorothiazide 20-12.5 MG tablet Commonly known as:  ZESTORETIC Take 1 tablet by mouth daily.   nitroGLYCERIN 0.4 MG SL tablet Commonly known as:  NITROSTAT Place 1 tablet (0.4 mg total) under the tongue every 5 (five) minutes x 3 doses as needed for chest pain.   ticagrelor 90 MG Tabs tablet Commonly known as:  BRILINTA Take 1 tablet (90 mg total) by mouth 2 (two) times daily.       Aspirin prescribed at discharge?  Yes High Intensity Statin Prescribed? (Lipitor 40-80mg  or Crestor 20-40mg ): Yes Beta Blocker Prescribed? Yes For EF <40%, was ACEI/ARB Prescribed? NA ADP Receptor Inhibitor Prescribed? (i.e. Plavix etc.-Includes Medically Managed Patients):NA For EF <40%, Aldosterone Inhibitor Prescribed? NA Was EF assessed during THIS hospitalization? Yes Was Cardiac Rehab II ordered? (Included Medically managed Patients): Yes   Outstanding Labs/Studies   None   Duration of Discharge Encounter   Greater than 30 minutes including physician time.  Signed, Georgie ChardJill McDaniel NP-C [facility] Pager: [phone] 07/02/2018, 3:34 PM

## 2018-07-02 NOTE — Progress Notes (Signed)
Patient expressing desire to leave this morning. Patient initially stating that he would wait until 0900, and if his catheterization was not complete by this time, then he would leave AMA. However, patient is now stating that he cannot wait any longer and that he would like to sign out AMA now. Dr. Jens Som was notified via phone and stated that he would send up a provider to talk with patient; Georgie Chard NP came to bedside.

## 2018-07-02 NOTE — Interval H&P Note (Signed)
Cath Lab Visit (complete for each Cath Lab visit)  Clinical Evaluation Leading to the Procedure:   ACS: Yes.    Non-ACS:    Anginal Classification: CCS III  Anti-ischemic medical therapy: Maximal Therapy (2 or more classes of medications)  Non-Invasive Test Results: No non-invasive testing performed  Prior CABG: No previous CABG      History and Physical Interval Note:  07/02/2018 9:51 AM  Don Fletcher  has presented today for surgery, with the diagnosis of NSTEMI.  The various methods of treatment have been discussed with the patient and family. After consideration of risks, benefits and other options for treatment, the patient has consented to  Procedure(s): LEFT HEART CATH AND CORONARY ANGIOGRAPHY (N/A) as a surgical intervention.  The patient's history has been reviewed, patient examined, no change in status, stable for surgery.  I have reviewed the patient's chart and labs.  Questions were answered to the patient's satisfaction.     Lyn Records III

## 2018-07-03 ENCOUNTER — Telehealth: Payer: Self-pay | Admitting: Cardiology

## 2018-07-03 MED ORDER — LISINOPRIL-HYDROCHLOROTHIAZIDE 20-12.5 MG PO TABS: 1.0000 | ORAL_TABLET | Freq: Every day | ORAL | 1 refills | Status: DC

## 2018-07-03 MED ORDER — ATORVASTATIN CALCIUM 80 MG PO TABS: 80.0000 mg | ORAL_TABLET | Freq: Every day | ORAL | 1 refills | Status: DC

## 2018-07-03 MED ORDER — CARVEDILOL 6.25 MG PO TABS: 6.2500 mg | ORAL_TABLET | Freq: Two times a day (BID) | ORAL | 1 refills | Status: DC

## 2018-07-03 NOTE — Telephone Encounter (Signed)
Pt requested refills of carvedilol atorvastatin and zestoretic #90 sent to [facility] in [place] - Medication sent to pharmacy.

## 2018-07-03 NOTE — Telephone Encounter (Signed)
Pt is needing refills on his meds, just had another heart attack and the hospital didn't refill any of his meds

## 2018-07-08 ENCOUNTER — Telehealth (INDEPENDENT_AMBULATORY_CARE_PROVIDER_SITE_OTHER): Payer: Medicaid Other | Admitting: Physician Assistant

## 2018-07-08 ENCOUNTER — Other Ambulatory Visit: Payer: Self-pay | Admitting: Cardiology

## 2018-07-08 ENCOUNTER — Other Ambulatory Visit: Payer: Self-pay

## 2018-07-08 VITALS — Ht 67.0 in | Wt 185.0 lb

## 2018-07-08 DIAGNOSIS — E785 Hyperlipidemia, unspecified: Secondary | ICD-10-CM | POA: Diagnosis not present

## 2018-07-08 DIAGNOSIS — Z720 Tobacco use: Secondary | ICD-10-CM

## 2018-07-08 DIAGNOSIS — I10 Essential (primary) hypertension: Secondary | ICD-10-CM | POA: Diagnosis not present

## 2018-07-08 DIAGNOSIS — I2510 Atherosclerotic heart disease of native coronary artery without angina pectoris: Secondary | ICD-10-CM

## 2018-07-08 NOTE — Progress Notes (Addendum)
Virtual Visit via Telephone Note   This visit type was conducted due to national recommendations for restrictions regarding the COVID-19 Pandemic (e.g. social distancing) in an effort to limit this patient's exposure and mitigate transmission in our community.  Due to his co-morbid illnesses, this patient is at least at moderate risk for complications without adequate follow up.  This format is felt to be most appropriate for this patient at this time.  The patient did not have access to video technology/had technical difficulties with video requiring transitioning to audio format only (telephone).  All issues noted in this document were discussed and addressed.  No physical exam could be performed with this format.  Please refer to the patient's chart for his  consent to telehealth for [facility].   Date:  07/08/2018   ID:  Don Fletcher, DOB July 27, 1970, MRN [id number]  Patient Location: Home Provider Location: Home  PCP:  Avon Gully, MD  Cardiologist:  Dina Rich, MD   Electrophysiologist:  None   Evaluation Performed:  Follow-Up Visit  Chief Complaint:  Hospital f/u  History of Present Illness:    Don Fletcher is a 48 y.o. male with history of CAD with multiple stents dating back to 2015, tobacco abuse, marijuana, previous cocaine, methamphetamines, noncompliance.  Discharged 07/02/18 after admission with NSTEMI peak troponin 6.56. , threatened to leave AMA but agreed to cath. He underwent overlapping stents distal RCA extending into PDA and prox to mid PDA. Residual 20-30% LAD, patent Cfx stent, mod diffuse disease prox-distal RCA-50%. Plan DAPT with ASA and Brilinta for 1 month then consider changing to Plavix.  Patient says he's doing well. Denies chest pain, shortness of breath, dizziness or presyncope. Smoking 1/2 ppd, trying to quit.Allergic to nicotine patches, can't use chantix because he's bipolar. Exercising 5 min once daily. Says he can't read or write. Wife  takes care of his meds. Says he's having trouble finding a psychiatrist but doesn't have transportation at this time.  The patient does not have symptoms concerning for COVID-19 infection (fever, chills, cough, or new shortness of breath).    Past Medical History:  Diagnosis Date  . Bipolar 1 disorder (HCC)   . CAD (coronary artery disease)    a. 12/16/13 inferolat STEMI s/p BMS to RPL. b. Cath 08/2014: interim occlusion of the PLA stent of the RCA, collateralized from the left coronary artery with diffuse nonobstructive CAD;  c. STEMI/PCI: LM nl, LAD nl, LCX 160m/d (2.75x12 synergy DES), RCA 30p, RPDA 75ost/p, RPAV 100 w/ L->R collats.  . Chronic back pain   . HLD (hyperlipidemia)   . Hypertension   . Ischemic cardiomyopathy    a. 12/2013: EF 50-55% by echo with +WMA. b. EF 45% by cath in 08/2014.  . Ischemic cardiomyopathy 05/19/2016  . Peptic ulcer disease   . Schizophrenic disorder (HCC)   . Tobacco abuse    Past Surgical History:  Procedure Laterality Date  . CARDIAC CATHETERIZATION N/A 08/07/2014   Procedure: Left Heart Cath and Coronary Angiography;  Surgeon: Tonny Bollman, MD;  Location: [facility] INVASIVE CV LAB;  Service: Cardiovascular;  Laterality: N/A;  . COLONOSCOPY N/A 06/06/2017   Procedure: COLONOSCOPY;  Surgeon: Malissa Hippo, MD;  Location: [facility] ENDO [facility]ITE;  Service: Endoscopy;  Laterality: N/A;  10:50  . CORONARY STENT INTERVENTION N/A 05/19/2016   Procedure: Coronary Stent Intervention;  Surgeon: Runell Gess, MD;  Location: [facility] INVASIVE CV LAB;  Service: Cardiovascular;  Laterality: N/A;  . CORONARY STENT INTERVENTION N/A  07/02/2018   Procedure: CORONARY STENT INTERVENTION;  Surgeon: Lyn RecordsSmith, Henry W, MD;  [facility];  Service: Cardiovascular;  Laterality: N/A;  . HERNIA REPAIR    . LEFT HEART CATH AND CORONARY ANGIOGRAPHY N/A 05/19/2016   Procedure: Left Heart Cath and Coronary Angiography;  Surgeon: Runell GessJonathan J Berry, MD;  [facility];   Service: Cardiovascular;  Laterality: N/A;  . LEFT HEART CATH AND CORONARY ANGIOGRAPHY N/A 07/02/2018   Procedure: LEFT HEART CATH AND CORONARY ANGIOGRAPHY;  Surgeon: Lyn RecordsSmith, Henry W, MD;  Location: [facility] INVASIVE CV LAB;  Service: Cardiovascular;  Laterality: N/A;  . LEFT HEART CATHETERIZATION WITH CORONARY ANGIOGRAM N/A 12/16/2013   Procedure: LEFT HEART CATHETERIZATION WITH CORONARY ANGIOGRAM;  Surgeon: Corky CraftsJayadeep S Varanasi, MD;  [facility];  Service: Cardiovascular;  Laterality: N/A;  . PERCUTANEOUS CORONARY STENT INTERVENTION (PCI-S)  12/16/2013   Procedure: PERCUTANEOUS CORONARY STENT INTERVENTION (PCI-S);  Surgeon: Corky CraftsJayadeep S Varanasi, MD;  [facility];  Service: Cardiovascular;;  distal rca     Current Meds  Medication Sig  . aspirin EC 81 MG tablet Take 81 mg by mouth daily.  [name]. atorvastatin (LIPITOR) 80 MG tablet Take 1 tablet (80 mg total) by mouth daily.  . carvedilol (COREG) 6.25 MG tablet TAKE 1 TABLET BY MOUTH 2 TIMES A DAY WITH MEALS.  [name]. lisinopril-hydrochlorothiazide (ZESTORETIC) 20-12.5 MG tablet Take 1 tablet by mouth daily.  . nitroGLYCERIN (NITROSTAT) 0.4 MG SL tablet Place 1 tablet (0.4 mg total) under the tongue every 5 (five) minutes x 3 doses as needed for chest pain.  . ticagrelor (BRILINTA) 90 MG TABS tablet Take 1 tablet (90 mg total) by mouth 2 (two) times daily.     Allergies:   Suboxone [buprenorphine hcl-naloxone hcl]; Benadryl [diphenhydramine]; Seroquel [quetiapine]; and Tylenol pm extra [diphenhydramine-apap (sleep)]   Social History   Tobacco Use  . Smoking status: Current Every Day Smoker    Packs/day: 1.00    Years: 25.00    Pack years: 25.00    Types: Cigars    Start date: 05/25/1988  . Smokeless tobacco: Former NeurosurgeonUser    Types: Snuff    Quit date: 05/21/1988  Substance Use Topics  . Alcohol use: No    Alcohol/week: 0.0 standard drinks  . Drug use: Yes    Types: Marijuana     Family Hx: The patient's family history includes  Heart attack (age of onset: 9140) in his brother and father.  ROS:   Please see the history of present illness.    Review of Systems  Constitution: Negative.  HENT: Negative.   Cardiovascular: Negative.   Respiratory: Negative.   Endocrine: Negative.   Hematologic/Lymphatic: Negative.   Musculoskeletal: Positive for back pain.  Gastrointestinal: Negative.   Genitourinary: Negative.   Neurological: Negative.        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   Prior CV studies:   The following studies were reviewed today:  Cardiac catheterization 07/02/2018:    Widely patent left main  Diffuse 20 to 30% LAD disease.  Change from 2018.  Circumflex stent previously placed in an overlapping fashion is widely patent.  Unchanged from 2018.  Moderate diffuse disease proximal to distal RCA up to 50%.  Distal RCA after the origin of the PDA is totally occluded in a previously stented segment.  The PDA shows progression to a 99% thrombus  filled stenosis at the site of the previously contained 75% narrowing.  Successful PCI with overlapping stents in the distal RCA extending into the PDA and in the proximal to mid PDA reducing 99% stenosis to 0% with TIMI grade III flow postdilated to 2.25 mm in diameter at high pressure.  TIMI grade III restored.   RECOMMENDATIONS:    Aggressive risk factor modification.  Smoking cessation.  Dual antiplatelet therapy, initially aspirin and Brilinta, and can consider decreasing intensity by switching to Plavix and aspirin after 30 days.   Labs/Other Tests and Data Reviewed:    EKG:  An ECG dated 07/03/18 was personally reviewed today and demonstrated:  NSR with inferior infarct  Recent Labs: 06/30/2018: Magnesium 2.1 07/01/2018: BUN 8; Potassium 3.8; Sodium 136 07/02/2018: Creatinine, Ser 0.80; Hemoglobin 14.4; Platelets 212   Recent Lipid Panel Lab Results   Component Value Date/Time   CHOL 270 (H) 07/01/2018 12:18 AM   TRIG 163 (H) 07/01/2018 12:18 AM   HDL 27 (L) 07/01/2018 12:18 AM   CHOLHDL 10.0 07/01/2018 12:18 AM   LDLCALC 210 (H) 07/01/2018 12:18 AM    Wt Readings from Last 3 Encounters:  07/08/18 185 lb (83.9 kg)  07/02/18 174 lb 12.8 oz (79.3 kg)  02/04/18 180 lb (81.6 kg)     Objective:    Vital Signs:  Ht 5\' 7"  (1.702 m)   Wt 185 lb (83.9 kg)   BMI 28.98 kg/m    VITAL SIGNS:  reviewed patient says cath site in wrist looks good.  ASSESSMENT & PLAN:    1. CAD status post NSTEMI 06/2018 treated with overlapping stent in the distal RCA extending into the PDA and proximal to mid PDA.  Residual 20 to 30% LAD, patent circumflex stent, moderate diffuse disease in the proximal to distal RCA 50%.  Plan DAPT with aspirin and Brilinta for 1 month then consider changing to Plavix and aspirin.  Patient tolerating meds well and says he is taking them.  Denies chest pain. 2. Tobacco abuse has cut back to half a pack per day.  Allergic to nicotine patches cannot chew the nicotine gum because of dentures and cannot take Chantix because of bipolar disease 3. Essential hypertension has been noncompliant with antihypertensives in the past.  Patient has no way of checking his blood pressure at home.  Continue current meds. 4. Hyperlipidemia LDL 210 on 07/01/2018 compliance with statin discussed.  COVID-19 Education: The signs and symptoms of COVID-19 were discussed with the patient and how to seek care for testing (follow up with PCP or arrange E-visit).   The importance of social distancing was discussed today.  Time:   Today, I have spent 10 minutes with the patient with telehealth technology discussing the above problems.     Medication Adjustments/Labs and Tests Ordered: Current medicines are reviewed at length with the patient today.  Concerns regarding medicines are outlined above.   Tests Ordered: No orders of the defined types were  placed in this encounter.   Medication Changes: No orders of the defined types were placed in this encounter.   Disposition:  Follow up in 2 month(s) Dr. Wyline Mood  Signed, Jacolyn Reedy, PA-C  07/08/2018 2:47 PM    [facility]

## 2018-07-08 NOTE — Patient Instructions (Signed)
Medication Instructions:  Your physician recommends that you continue on your current medications as directed. Please refer to the Current Medication list given to you today.   Labwork: NONE  Testing/Procedures: NONE  Follow-Up: Your physician recommends that you schedule a follow-up appointment in: 2 Months   Any Other Special Instructions Will Be Listed Below (If Applicable).     If you need a refill on your cardiac medications before your next appointment, please call your pharmacy.  Thank you for choosing [facility]!

## 2018-07-08 NOTE — Progress Notes (Signed)
So sorry. Taken care of!

## 2018-07-16 ENCOUNTER — Telehealth: Payer: Medicaid Other | Admitting: Student

## 2018-07-23 ENCOUNTER — Other Ambulatory Visit: Payer: Self-pay | Admitting: Cardiology

## 2018-07-29 ENCOUNTER — Telehealth: Payer: Self-pay | Admitting: Cardiology

## 2018-07-29 MED ORDER — CLOPIDOGREL BISULFATE 75 MG PO TABS: 300.0000 mg | ORAL_TABLET | Freq: Once | ORAL | 1 refills | Status: AC

## 2018-07-29 NOTE — Telephone Encounter (Signed)
Records reviewed including about his most recent stents. He has had SOB with brillinta in the past, which is likely the cause this time around. Can start plavix 300mg  x 1 day, then 75mg  daily. Do not stop brillinta until he has the plavix, once starting the plavix he can stop the brillinta.    Zandra Abts MD

## 2018-07-29 NOTE — Telephone Encounter (Signed)
Patient informed and verbalized understanding of plan. Plavix rx sent to [facility].

## 2018-07-29 NOTE — Telephone Encounter (Signed)
Patient said he is having sob. Says he gave out of breath when taking a shower. Says he noticed the sob starting 2 weeks ago and has been on brilinta 90 mg for one month. Denies dizziness or chest pain. Reports the sob is unchanged for the past 2 weeks. Also request refill for carvedilol 6.25 mg. Advised that refill will be sent to [facility] for carvedilol and message would be sent to Dr. Harl Bowie r/e SOB. Advised if SOB gets worse, to go to the ED for an evaluation. Verbalized understanding.

## 2018-07-29 NOTE — Telephone Encounter (Signed)
Patient having hard time breathing since starting blood thinners

## 2018-08-06 ENCOUNTER — Other Ambulatory Visit: Payer: Self-pay | Admitting: Cardiology

## 2018-08-06 ENCOUNTER — Ambulatory Visit: Payer: Medicaid Other | Admitting: Cardiology

## 2018-09-17 ENCOUNTER — Ambulatory Visit: Payer: Medicaid Other | Admitting: Cardiology

## 2018-09-19 ENCOUNTER — Other Ambulatory Visit: Payer: Self-pay | Admitting: Cardiology

## 2018-09-20 ENCOUNTER — Other Ambulatory Visit: Payer: Self-pay | Admitting: Cardiology

## 2018-11-18 ENCOUNTER — Other Ambulatory Visit: Payer: Self-pay

## 2018-11-18 ENCOUNTER — Emergency Department (HOSPITAL_COMMUNITY): Payer: Medicaid Other

## 2018-11-18 ENCOUNTER — Emergency Department (HOSPITAL_COMMUNITY)
Admission: EM | Admit: 2018-11-18 | Discharge: 2018-11-18 | Disposition: A | Discharge status: Home / Self Care | Payer: Medicaid Other | Attending: Emergency Medicine | Admitting: Emergency Medicine

## 2018-11-18 ENCOUNTER — Encounter (HOSPITAL_COMMUNITY): Payer: Self-pay | Admitting: Emergency Medicine

## 2018-11-18 DIAGNOSIS — B001 Herpesviral vesicular dermatitis: Secondary | ICD-10-CM | POA: Diagnosis not present

## 2018-11-18 DIAGNOSIS — Z5321 Procedure and treatment not carried out due to patient leaving prior to being seen by health care provider: Secondary | ICD-10-CM | POA: Diagnosis not present

## 2018-11-18 DIAGNOSIS — H9201 Otalgia, right ear: Secondary | ICD-10-CM | POA: Insufficient documentation

## 2018-11-18 LAB — CBC
HCT: 50.3 % (ref 39.0–52.0)
Hemoglobin: 16.5 g/dL (ref 13.0–17.0)
MCH: 31.2 pg (ref 26.0–34.0)
MCHC: 32.8 g/dL (ref 30.0–36.0)
MCV: 95.1 fL (ref 80.0–100.0)
Platelets: 230 10*3/uL (ref 150–400)
RBC: 5.29 MIL/uL (ref 4.22–5.81)
RDW: 13 % (ref 11.5–15.5)
WBC: 7.5 10*3/uL (ref 4.0–10.5)
nRBC: 0 % (ref 0.0–0.2)

## 2018-11-18 LAB — BASIC METABOLIC PANEL
Anion gap: 9 (ref 5–15)
BUN: 11 mg/dL (ref 6–20)
CO2: 23 mmol/L (ref 22–32)
Calcium: 9 mg/dL (ref 8.9–10.3)
Chloride: 106 mmol/L (ref 98–111)
Creatinine, Ser: 1 mg/dL (ref 0.61–1.24)
GFR calc Af Amer: >60 mL/min (ref >60)
GFR calc non Af Amer: >60 mL/min (ref >60)
Glucose, Bld: 109 mg/dL — ABNORMAL HIGH (ref 70–99)
Potassium: 4.7 mmol/L (ref 3.5–5.1)
Sodium: 138 mmol/L (ref 135–145)

## 2018-11-18 LAB — TROPONIN I (HIGH SENSITIVITY): Troponin I (High Sensitivity): 3 ng/L (ref <18)

## 2018-11-18 MED ORDER — SODIUM CHLORIDE 0.9% FLUSH: 3.0000 mL | Freq: Once | INTRAVENOUS | Status: DC

## 2018-11-18 NOTE — ED Triage Notes (Signed)
Decreased hearing to and pain to RT ear since this morning.  Fever blisters on lips that started yesterday morning.  Also reports chest tightness since this morning.

## 2018-11-18 NOTE — ED Notes (Signed)
Called to go to room no answer

## 2018-11-18 NOTE — ED Notes (Signed)
Patient called from waiting 3 times, no answer

## 2018-12-06 ENCOUNTER — Encounter: Payer: Self-pay | Admitting: Cardiology

## 2018-12-06 ENCOUNTER — Ambulatory Visit: Payer: Medicaid Other | Admitting: Cardiology

## 2019-08-13 ENCOUNTER — Emergency Department (HOSPITAL_COMMUNITY): Payer: Medicaid Other

## 2019-08-13 ENCOUNTER — Emergency Department (HOSPITAL_COMMUNITY)
Admission: EM | Admit: 2019-08-13 | Discharge: 2019-08-13 | Disposition: A | Discharge status: Home / Self Care | Payer: Medicaid Other | Attending: Emergency Medicine | Admitting: Emergency Medicine

## 2019-08-13 ENCOUNTER — Other Ambulatory Visit: Payer: Self-pay

## 2019-08-13 ENCOUNTER — Encounter (HOSPITAL_COMMUNITY): Payer: Self-pay | Admitting: *Deleted

## 2019-08-13 DIAGNOSIS — Z5321 Procedure and treatment not carried out due to patient leaving prior to being seen by health care provider: Secondary | ICD-10-CM | POA: Insufficient documentation

## 2019-08-13 DIAGNOSIS — R079 Chest pain, unspecified: Secondary | ICD-10-CM | POA: Insufficient documentation

## 2019-08-13 LAB — BASIC METABOLIC PANEL
Anion gap: 9 (ref 5–15)
BUN: 13 mg/dL (ref 6–20)
CO2: 23 mmol/L (ref 22–32)
Calcium: 9.2 mg/dL (ref 8.9–10.3)
Chloride: 105 mmol/L (ref 98–111)
Creatinine, Ser: 1.03 mg/dL (ref 0.61–1.24)
GFR calc Af Amer: >60 mL/min (ref >60)
GFR calc non Af Amer: >60 mL/min (ref >60)
Glucose, Bld: 128 mg/dL — ABNORMAL HIGH (ref 70–99)
Potassium: 3.9 mmol/L (ref 3.5–5.1)
Sodium: 137 mmol/L (ref 135–145)

## 2019-08-13 LAB — CBC
HCT: 53.8 % — ABNORMAL HIGH (ref 39.0–52.0)
Hemoglobin: 18.3 g/dL — ABNORMAL HIGH (ref 13.0–17.0)
MCH: 31.9 pg (ref 26.0–34.0)
MCHC: 34 g/dL (ref 30.0–36.0)
MCV: 93.7 fL (ref 80.0–100.0)
Platelets: 265 10*3/uL (ref 150–400)
RBC: 5.74 MIL/uL (ref 4.22–5.81)
RDW: 12.9 % (ref 11.5–15.5)
WBC: 10.4 10*3/uL (ref 4.0–10.5)
nRBC: 0 % (ref 0.0–0.2)

## 2019-08-13 LAB — TROPONIN I (HIGH SENSITIVITY): Troponin I (High Sensitivity): 2 ng/L (ref <18)

## 2019-08-13 MED ORDER — SODIUM CHLORIDE 0.9% FLUSH: 3.0000 mL | Freq: Once | INTRAVENOUS | Status: DC

## 2019-08-13 NOTE — ED Notes (Signed)
Screener informed the triage nurse of pt's leaving.

## 2019-08-13 NOTE — ED Triage Notes (Addendum)
Pt with HA and clammy hands for past 3 days like before with previous  MI's.  Mild CP last night, pt with hx of 3 MI's in the past.

## 2019-09-21 ENCOUNTER — Encounter (HOSPITAL_COMMUNITY): Payer: Self-pay | Admitting: Emergency Medicine

## 2019-09-21 ENCOUNTER — Emergency Department (HOSPITAL_COMMUNITY): Payer: Medicaid Other

## 2019-09-21 ENCOUNTER — Emergency Department (HOSPITAL_COMMUNITY)
Admission: EM | Admit: 2019-09-21 | Discharge: 2019-09-22 | Disposition: A | Discharge status: Home / Self Care | Payer: Medicaid Other | Attending: Emergency Medicine | Admitting: Emergency Medicine

## 2019-09-21 ENCOUNTER — Other Ambulatory Visit: Payer: Self-pay

## 2019-09-21 DIAGNOSIS — F17210 Nicotine dependence, cigarettes, uncomplicated: Secondary | ICD-10-CM | POA: Insufficient documentation

## 2019-09-21 DIAGNOSIS — Y9389 Activity, other specified: Secondary | ICD-10-CM | POA: Diagnosis not present

## 2019-09-21 DIAGNOSIS — I2510 Atherosclerotic heart disease of native coronary artery without angina pectoris: Secondary | ICD-10-CM | POA: Insufficient documentation

## 2019-09-21 DIAGNOSIS — I10 Essential (primary) hypertension: Secondary | ICD-10-CM | POA: Insufficient documentation

## 2019-09-21 DIAGNOSIS — Y998 Other external cause status: Secondary | ICD-10-CM | POA: Insufficient documentation

## 2019-09-21 DIAGNOSIS — Z7982 Long term (current) use of aspirin: Secondary | ICD-10-CM | POA: Insufficient documentation

## 2019-09-21 DIAGNOSIS — M545 Low back pain: Secondary | ICD-10-CM | POA: Diagnosis present

## 2019-09-21 DIAGNOSIS — Y9289 Other specified places as the place of occurrence of the external cause: Secondary | ICD-10-CM | POA: Diagnosis not present

## 2019-09-21 DIAGNOSIS — S39012A Strain of muscle, fascia and tendon of lower back, initial encounter: Secondary | ICD-10-CM | POA: Diagnosis not present

## 2019-09-21 DIAGNOSIS — Z79899 Other long term (current) drug therapy: Secondary | ICD-10-CM | POA: Insufficient documentation

## 2019-09-21 MED ORDER — METHOCARBAMOL 500 MG PO TABS
500.0000 mg | ORAL_TABLET | Freq: Once | ORAL | Status: AC
  Administered 2019-09-21: 500 mg via ORAL
  Filled 2019-09-21: qty 1

## 2019-09-21 MED ORDER — LIDOCAINE 5 % EX PTCH: 1.0000 | MEDICATED_PATCH | CUTANEOUS | 0 refills | Status: DC

## 2019-09-21 MED ORDER — KETOROLAC TROMETHAMINE 30 MG/ML IJ SOLN
60.0000 mg | Freq: Once | INTRAMUSCULAR | Status: AC
  Administered 2019-09-21: 60 mg via INTRAMUSCULAR
  Filled 2019-09-21: qty 2

## 2019-09-21 MED ORDER — METHOCARBAMOL 500 MG PO TABS: 500.0000 mg | ORAL_TABLET | Freq: Three times a day (TID) | ORAL | 0 refills | Status: DC

## 2019-09-21 NOTE — Discharge Instructions (Addendum)
Alternate ice and heat to your lower back.  Take the muscle relaxer as prescribed.  Follow-up with your primary doctor this week for recheck if your symptoms are not improving.

## 2019-09-21 NOTE — ED Provider Notes (Signed)
[facility] EMERGENCY DEPARTMENT Provider Note   CSN: [id number] Arrival date & time: 09/21/19  2013     History Chief Complaint  Patient presents with   Back Pain    Don Fletcher is a 49 y.o. male.  HPI    Don Fletcher is a 49 y.o. male with past medical history of hypertension, coronary artery disease and chronic back pain who presents to the Emergency Department complaining of left lower back pain for 1 day.  He states that he was lifting and moving concrete blocks yesterday and felt a sharp stabbing type pain to his left lower back.  Pain radiates to his buttock, but does not radiate into his leg.  He has tried Tylenol without relief.  He denies abdominal pain, urine or bowel changes, pain numbness or weakness of his lower extremities, fever, chills.  He uses a cane at baseline.  Past Medical History:  Diagnosis Date   Bipolar 1 disorder (HCC)    CAD (coronary artery disease)    a. 12/16/13 inferolat STEMI s/p BMS to RPL. b. Cath 08/2014: interim occlusion of the PLA stent of the RCA, collateralized from the left coronary artery with diffuse nonobstructive CAD;  c. STEMI/PCI: LM nl, LAD nl, LCX 114m/d (2.75x12 synergy DES), RCA 30p, RPDA 75ost/p, RPAV 100 w/ L->R collats.   Chronic back pain    HLD (hyperlipidemia)    Hypertension    Ischemic cardiomyopathy    a. 12/2013: EF 50-55% by echo with +WMA. b. EF 45% by cath in 08/2014.   Ischemic cardiomyopathy 05/19/2016   Peptic ulcer disease    Schizophrenic disorder (HCC)    Tobacco abuse     Patient Active Problem List   Diagnosis Date Noted   NSTEMI (non-ST elevated myocardial infarction) (HCC) 06/30/2018   Family hx of colon cancer 02/14/2017   STEMI (ST elevation myocardial infarction) (HCC) 05/19/2016   Acute ST elevation myocardial infarction (STEMI) involving left circumflex coronary artery without development of Q waves (HCC) 05/19/2016   Ischemic cardiomyopathy 05/19/2016   Unstable angina (HCC)  08/06/2014   ST elevation myocardial infarction involving right coronary artery (HCC)    CAD (coronary artery disease)    Tobacco abuse    HLD (hyperlipidemia)    Hypertension    Family history of premature CAD 12/16/2013   CAD S/P PCI:  2.0 x 23 vision bare metal stent (2.25 mm) RPL 12/16/2013    Class: Acute   Schizophrenic disorder (HCC)    Bipolar 1 disorder (HCC)     Past Surgical History:  Procedure Laterality Date   CARDIAC CATHETERIZATION N/A 08/07/2014   Procedure: Left Heart Cath and Coronary Angiography;  Surgeon: Tonny Bollman, MD;  Location: [facility] INVASIVE CV LAB;  Service: Cardiovascular;  Laterality: N/A;   COLONOSCOPY N/A 06/06/2017   Procedure: COLONOSCOPY;  Surgeon: Malissa Hippo, MD;  Location: [facility] ENDO [facility]ITE;  Service: Endoscopy;  Laterality: N/A;  10:50   CORONARY STENT INTERVENTION N/A 05/19/2016   Procedure: Coronary Stent Intervention;  Surgeon: Runell Gess, MD;  Location: [facility] INVASIVE CV LAB;  Service: Cardiovascular;  Laterality: N/A;   CORONARY STENT INTERVENTION N/A 07/02/2018   Procedure: CORONARY STENT INTERVENTION;  Surgeon: Lyn Records, MD;  Location: [facility] INVASIVE CV LAB;  Service: Cardiovascular;  Laterality: N/A;   HERNIA REPAIR     LEFT HEART CATH AND CORONARY ANGIOGRAPHY N/A 05/19/2016   Procedure: Left Heart Cath and Coronary Angiography;  Surgeon: Runell Gess, MD;  Location: [facility] INVASIVE  CV LAB;  Service: Cardiovascular;  Laterality: N/A;   LEFT HEART CATH AND CORONARY ANGIOGRAPHY N/A 07/02/2018   Procedure: LEFT HEART CATH AND CORONARY ANGIOGRAPHY;  Surgeon: Lyn RecordsSmith, Henry W, MD;  Location: [facility] INVASIVE CV LAB;  Service: Cardiovascular;  Laterality: N/A;   LEFT HEART CATHETERIZATION WITH CORONARY ANGIOGRAM N/A 12/16/2013   Procedure: LEFT HEART CATHETERIZATION WITH CORONARY ANGIOGRAM;  Surgeon: Corky CraftsJayadeep S Varanasi, MD;  [facility];  Service: Cardiovascular;  Laterality: N/A;   PERCUTANEOUS CORONARY STENT INTERVENTION  (PCI-S)  12/16/2013   Procedure: PERCUTANEOUS CORONARY STENT INTERVENTION (PCI-S);  Surgeon: Corky CraftsJayadeep S Varanasi, MD;  [facility];  Service: Cardiovascular;;  distal rca       Family History  Problem Relation Age of Onset   Heart attack Father 4040   Heart attack Brother 7840    Social History   Tobacco Use   Smoking status: Current Every Day Smoker    Packs/day: 1.00    Years: 25.00    Pack years: 25.00    Types: Cigarettes, Cigars    Start date: 05/25/1988   Smokeless tobacco: Former NeurosurgeonUser    Types: Snuff    Quit date: 05/21/1988  Vaping Use   Vaping Use: Never used  Substance Use Topics   Alcohol use: No    Alcohol/week: 0.0 standard drinks   Drug use: Yes    Types: Marijuana    Comment: last use x 1 hour    Home Medications Prior to Admission medications   Medication Sig Start Date End Date Taking? Authorizing Provider  aspirin EC 81 MG tablet Take 81 mg by mouth daily.    [provider]  atorvastatin (LIPITOR) 80 MG tablet TAKE 1 TABLET ONCE DAILY. 09/20/18   Antoine PocheBranch, Jonathan F, MD  carvedilol (COREG) 6.25 MG tablet TAKE 1 TABLET BY MOUTH 2 TIMES A DAY WITH MEALS. 08/06/18   Dyann KiefLenze, Michele M, PA-C  lisinopril-hydrochlorothiazide (ZESTORETIC) 20-12.5 MG tablet TAKE 1 TABLET ONCE DAILY. 09/20/18   Antoine PocheBranch, Jonathan F, MD  nitroGLYCERIN (NITROSTAT) 0.4 MG SL tablet Place 1 tablet (0.4 mg total) under the tongue every 5 (five) minutes x 3 doses as needed for chest pain. 07/02/18   Georgie ChardMcDaniel, Jill D, NP    Allergies    Suboxone [buprenorphine hcl-naloxone hcl], Benadryl [diphenhydramine], Seroquel [quetiapine], and Tylenol pm extra [diphenhydramine-apap (sleep)]  Review of Systems   Review of Systems  Constitutional: Negative for fever.  Respiratory: Negative for shortness of breath.   Cardiovascular: Negative for chest pain.  Gastrointestinal: Negative for abdominal pain, constipation, diarrhea and vomiting.  Genitourinary: Negative for decreased  urine volume, difficulty urinating, dysuria and flank pain.  Musculoskeletal: Positive for back pain. Negative for joint swelling.  Skin: Negative for rash.  Neurological: Negative for weakness and numbness.    Physical Exam Updated Vital Signs BP (!) 143/84    Pulse 89    Temp 98.2 F (36.8 C)    Resp 18    Ht 5\' 7"  (1.702 m)    Wt 77.1 kg    SpO2 99%    BMI 26.63 kg/m   Physical Exam Vitals and nursing note reviewed.  Constitutional:      General: He is not in acute distress.    Appearance: Normal appearance. He is well-developed. He is not ill-appearing.  HENT:     Head: Normocephalic and atraumatic.  Cardiovascular:     Rate and Rhythm: Normal rate and regular rhythm.     Pulses: Normal pulses.  Heart sounds: Normal heart sounds.     Comments: DP pulses are strong and palpable bilaterally Pulmonary:     Effort: Pulmonary effort is normal. No respiratory distress.     Breath sounds: Normal breath sounds.  Chest:     Chest wall: No tenderness.  Abdominal:     General: There is no distension.     Palpations: Abdomen is soft.     Tenderness: There is no abdominal tenderness.  Musculoskeletal:        General: Tenderness present.     Cervical back: Normal range of motion and neck supple.     Lumbar back: Tenderness present. No swelling, deformity or lacerations. Normal range of motion.     Comments: ttp of the lower lumbar spine and left lumbar paraspinal muscles.  Pt has 4/5 strength against resistance of bilateral lower extremities.     Skin:    General: Skin is warm.     Capillary Refill: Capillary refill takes less than 2 seconds.     Findings: No rash.  Neurological:     General: No focal deficit present.     Mental Status: He is alert.     Sensory: No sensory deficit.     Motor: No weakness or abnormal muscle tone.     Coordination: Coordination normal.     Gait: Gait normal.     Deep Tendon Reflexes:     Reflex Scores:      Patellar reflexes are 2+ on the  right side and 2+ on the left side.      Achilles reflexes are 2+ on the right side and 2+ on the left side.    ED Results / Procedures / Treatments   Labs (all labs ordered are listed, but only abnormal results are displayed) Labs Reviewed - No data to display  EKG None  Radiology DG Lumbar Spine Complete  Result Date: 09/21/2019 CLINICAL DATA:  Initial evaluation for back pain status post moving concrete blocks yesterday. EXAM: LUMBAR SPINE - COMPLETE 4+ VIEW COMPARISON:  Prior CT from 03/23/2009. FINDINGS: Trace levoscoliosis with straightening of the normal lumbar lordosis. No listhesis or malalignment. Vertebral body height well maintained without acute or chronic fracture. Visualized sacrum and pelvis intact. Moderate degenerative intervertebral disc space narrowing with endplate reactive endplate changes present at L5-S1. More mild reactive endplate spurring noted at L3-4 and L4-5. Visualized soft tissues within normal limits. IMPRESSION: 1. No radiographic evidence for acute abnormality within the lumbar spine. 2. 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 Electronically Signed   By: Rise Mu M.D.   On: 09/21/2019 21:03    Procedures Procedures (including critical care time)  Medications Ordered in ED Medications  ketorolac (TORADOL) 30 MG/ML injection 60 mg (has no administration in time range)  methocarbamol (ROBAXIN) tablet 500 mg (has no administration in time range)    ED Course  I have reviewed the triage vital signs and the nursing notes.  Pertinent labs & imaging results that were available during my care of the patient were reviewed by me and considered in my medical decision making (see chart for details).    MDM Rules/Calculators/A&P                          Patient here with likely acute on chronic low back pain.  Pain began after lifting concrete blocks.  No focal neuro deficits on exam.  He is ambulatory with a cane at baseline.  No concerning  symptoms to suggest cauda equina.  Likely lumbar strain.  Record records reviewed, patient had normal BUN/creatinine last month.  Given IM Toradol and muscle relaxer here.  X-ray here shows degenerative changes without acute findings.  I feel he is appropriate for discharge home, agrees to symptomatic treatment.  He does have a PCP and agrees to arrange follow-up appointment.    Final Clinical Impression(s) / ED Diagnoses Final diagnoses:  Strain of lumbar region, initial encounter    Rx / DC Orders ED Discharge Orders    None       Pauline Aus, PA-C 09/21/19 2315    Mancel Bale, MD 09/21/19 [phone]

## 2019-09-21 NOTE — ED Triage Notes (Signed)
Pt c/o lower back pain after moving concrete blocks yesterday.

## 2019-09-22 NOTE — ED Notes (Signed)
Pt ambulatory to waiting room. Pt verbalized understanding of discharge instructions.   

## 2019-10-28 ENCOUNTER — Other Ambulatory Visit: Payer: Self-pay

## 2019-10-28 ENCOUNTER — Encounter (HOSPITAL_COMMUNITY): Payer: Self-pay | Admitting: *Deleted

## 2019-10-28 ENCOUNTER — Emergency Department (HOSPITAL_COMMUNITY)
Admission: EM | Admit: 2019-10-28 | Discharge: 2019-10-28 | Disposition: A | Discharge status: Home / Self Care | Payer: Medicaid Other | Attending: Emergency Medicine | Admitting: Emergency Medicine

## 2019-10-28 DIAGNOSIS — R112 Nausea with vomiting, unspecified: Secondary | ICD-10-CM | POA: Insufficient documentation

## 2019-10-28 DIAGNOSIS — R519 Headache, unspecified: Secondary | ICD-10-CM | POA: Insufficient documentation

## 2019-10-28 DIAGNOSIS — M791 Myalgia, unspecified site: Secondary | ICD-10-CM | POA: Diagnosis not present

## 2019-10-28 DIAGNOSIS — Z5321 Procedure and treatment not carried out due to patient leaving prior to being seen by health care provider: Secondary | ICD-10-CM | POA: Diagnosis not present

## 2019-10-28 DIAGNOSIS — R197 Diarrhea, unspecified: Secondary | ICD-10-CM | POA: Diagnosis not present

## 2019-10-28 DIAGNOSIS — Z20822 Contact with and (suspected) exposure to covid-19: Secondary | ICD-10-CM | POA: Insufficient documentation

## 2019-10-28 LAB — COMPREHENSIVE METABOLIC PANEL
ALT: 36 U/L (ref 0–44)
AST: 21 U/L (ref 15–41)
Albumin: 3.9 g/dL (ref 3.5–5.0)
Alkaline Phosphatase: 98 U/L (ref 38–126)
Anion gap: 9 (ref 5–15)
BUN: 16 mg/dL (ref 6–20)
CO2: 24 mmol/L (ref 22–32)
Calcium: 9.1 mg/dL (ref 8.9–10.3)
Chloride: 107 mmol/L (ref 98–111)
Creatinine, Ser: 1.07 mg/dL (ref 0.61–1.24)
GFR calc Af Amer: >60 mL/min (ref >60)
GFR calc non Af Amer: >60 mL/min (ref >60)
Glucose, Bld: 88 mg/dL (ref 70–99)
Potassium: 4.1 mmol/L (ref 3.5–5.1)
Sodium: 140 mmol/L (ref 135–145)
Total Bilirubin: 0.4 mg/dL (ref 0.3–1.2)
Total Protein: 7.5 g/dL (ref 6.5–8.1)

## 2019-10-28 LAB — SARS CORONAVIRUS 2 BY RT PCR (HOSPITAL ORDER, PERFORMED IN ~~LOC~~ HOSPITAL LAB): SARS Coronavirus 2: NEGATIVE

## 2019-10-28 LAB — CBC
HCT: 52.1 % — ABNORMAL HIGH (ref 39.0–52.0)
Hemoglobin: 17.6 g/dL — ABNORMAL HIGH (ref 13.0–17.0)
MCH: 32 pg (ref 26.0–34.0)
MCHC: 33.8 g/dL (ref 30.0–36.0)
MCV: 94.7 fL (ref 80.0–100.0)
Platelets: 254 10*3/uL (ref 150–400)
RBC: 5.5 MIL/uL (ref 4.22–5.81)
RDW: 13 % (ref 11.5–15.5)
WBC: 10.3 10*3/uL (ref 4.0–10.5)
nRBC: 0 % (ref 0.0–0.2)

## 2019-10-28 LAB — LIPASE, BLOOD: Lipase: 38 U/L (ref 11–51)

## 2019-10-28 NOTE — ED Triage Notes (Signed)
Pt c/o headache, n/v/d x 3 days; pt c/o body aches

## 2019-10-31 ENCOUNTER — Emergency Department (HOSPITAL_COMMUNITY)
Admission: EM | Admit: 2019-10-31 | Discharge: 2019-10-31 | Disposition: A | Discharge status: Home / Self Care | Payer: Medicaid Other | Attending: Emergency Medicine | Admitting: Emergency Medicine

## 2019-10-31 ENCOUNTER — Encounter (HOSPITAL_COMMUNITY): Payer: Self-pay | Admitting: *Deleted

## 2019-10-31 ENCOUNTER — Emergency Department (HOSPITAL_COMMUNITY): Payer: Medicaid Other

## 2019-10-31 ENCOUNTER — Other Ambulatory Visit: Payer: Self-pay

## 2019-10-31 DIAGNOSIS — F17210 Nicotine dependence, cigarettes, uncomplicated: Secondary | ICD-10-CM | POA: Insufficient documentation

## 2019-10-31 DIAGNOSIS — K529 Noninfective gastroenteritis and colitis, unspecified: Secondary | ICD-10-CM | POA: Diagnosis not present

## 2019-10-31 DIAGNOSIS — I10 Essential (primary) hypertension: Secondary | ICD-10-CM | POA: Insufficient documentation

## 2019-10-31 DIAGNOSIS — Z7982 Long term (current) use of aspirin: Secondary | ICD-10-CM | POA: Insufficient documentation

## 2019-10-31 DIAGNOSIS — K920 Hematemesis: Secondary | ICD-10-CM | POA: Diagnosis not present

## 2019-10-31 DIAGNOSIS — I2510 Atherosclerotic heart disease of native coronary artery without angina pectoris: Secondary | ICD-10-CM | POA: Diagnosis not present

## 2019-10-31 DIAGNOSIS — Z8719 Personal history of other diseases of the digestive system: Secondary | ICD-10-CM

## 2019-10-31 DIAGNOSIS — Z79899 Other long term (current) drug therapy: Secondary | ICD-10-CM | POA: Insufficient documentation

## 2019-10-31 DIAGNOSIS — Z951 Presence of aortocoronary bypass graft: Secondary | ICD-10-CM | POA: Insufficient documentation

## 2019-10-31 DIAGNOSIS — R1013 Epigastric pain: Secondary | ICD-10-CM | POA: Diagnosis present

## 2019-10-31 LAB — URINALYSIS, ROUTINE W REFLEX MICROSCOPIC
Bilirubin Urine: NEGATIVE
Glucose, UA: NEGATIVE mg/dL
Hgb urine dipstick: NEGATIVE
Ketones, ur: NEGATIVE mg/dL
Leukocytes,Ua: NEGATIVE
Nitrite: NEGATIVE
Protein, ur: NEGATIVE mg/dL
Specific Gravity, Urine: >1.046 — ABNORMAL HIGH (ref 1.005–1.030)
pH: 7 (ref 5.0–8.0)

## 2019-10-31 LAB — COMPREHENSIVE METABOLIC PANEL
ALT: 36 U/L (ref 0–44)
AST: 23 U/L (ref 15–41)
Albumin: 3.8 g/dL (ref 3.5–5.0)
Alkaline Phosphatase: 107 U/L (ref 38–126)
Anion gap: 9 (ref 5–15)
BUN: 17 mg/dL (ref 6–20)
CO2: 27 mmol/L (ref 22–32)
Calcium: 9.5 mg/dL (ref 8.9–10.3)
Chloride: 104 mmol/L (ref 98–111)
Creatinine, Ser: 0.96 mg/dL (ref 0.61–1.24)
GFR calc Af Amer: >60 mL/min (ref >60)
GFR calc non Af Amer: >60 mL/min (ref >60)
Glucose, Bld: 103 mg/dL — ABNORMAL HIGH (ref 70–99)
Potassium: 4.2 mmol/L (ref 3.5–5.1)
Sodium: 140 mmol/L (ref 135–145)
Total Bilirubin: 0.5 mg/dL (ref 0.3–1.2)
Total Protein: 7.6 g/dL (ref 6.5–8.1)

## 2019-10-31 LAB — CBC
HCT: 52.5 % — ABNORMAL HIGH (ref 39.0–52.0)
Hemoglobin: 17.6 g/dL — ABNORMAL HIGH (ref 13.0–17.0)
MCH: 31.6 pg (ref 26.0–34.0)
MCHC: 33.5 g/dL (ref 30.0–36.0)
MCV: 94.3 fL (ref 80.0–100.0)
Platelets: 288 10*3/uL (ref 150–400)
RBC: 5.57 MIL/uL (ref 4.22–5.81)
RDW: 13.1 % (ref 11.5–15.5)
WBC: 9.4 10*3/uL (ref 4.0–10.5)
nRBC: 0 % (ref 0.0–0.2)

## 2019-10-31 LAB — HEMOGLOBIN AND HEMATOCRIT, BLOOD
HCT: 50.9 % (ref 39.0–52.0)
Hemoglobin: 16.7 g/dL (ref 13.0–17.0)

## 2019-10-31 LAB — TROPONIN I (HIGH SENSITIVITY)
Troponin I (High Sensitivity): 4 ng/L (ref <18)
Troponin I (High Sensitivity): 3 ng/L (ref <18)

## 2019-10-31 LAB — LIPASE, BLOOD: Lipase: 29 U/L (ref 11–51)

## 2019-10-31 MED ORDER — SODIUM CHLORIDE 0.9 % IV BOLUS
500.0000 mL | Freq: Once | INTRAVENOUS | Status: AC
  Administered 2019-10-31: 500 mL via INTRAVENOUS

## 2019-10-31 MED ORDER — HYDROMORPHONE HCL 1 MG/ML IJ SOLN
1.0000 mg | Freq: Once | INTRAMUSCULAR | Status: AC
  Administered 2019-10-31: 1 mg via INTRAVENOUS
  Filled 2019-10-31: qty 1

## 2019-10-31 MED ORDER — PANTOPRAZOLE SODIUM 40 MG IV SOLR
40.0000 mg | Freq: Once | INTRAVENOUS | Status: AC
  Administered 2019-10-31: 40 mg via INTRAVENOUS
  Filled 2019-10-31: qty 40

## 2019-10-31 MED ORDER — IOHEXOL 300 MG/ML  SOLN
100.0000 mL | Freq: Once | INTRAMUSCULAR | Status: AC | PRN
  Administered 2019-10-31: 100 mL via INTRAVENOUS

## 2019-10-31 MED ORDER — ONDANSETRON 4 MG PO TBDP: 4.0000 mg | ORAL_TABLET | Freq: Three times a day (TID) | ORAL | 1 refills | Status: DC | PRN

## 2019-10-31 MED ORDER — SODIUM CHLORIDE 0.9 % IV SOLN: INTRAVENOUS | Status: DC

## 2019-10-31 MED ORDER — FAMOTIDINE 20 MG PO TABS: 20.0000 mg | ORAL_TABLET | Freq: Two times a day (BID) | ORAL | 0 refills | Status: DC

## 2019-10-31 MED ORDER — ONDANSETRON HCL 4 MG/2ML IJ SOLN
4.0000 mg | Freq: Once | INTRAMUSCULAR | Status: AC
  Administered 2019-10-31: 4 mg via INTRAVENOUS
  Filled 2019-10-31: qty 2

## 2019-10-31 NOTE — ED Provider Notes (Signed)
[facility] Provider Note   CSN: [id number] Arrival date & time: 10/31/19  1350     History Chief Complaint  Patient presents with  . Hematemesis  . Diarrhea    Tawni CarnesSteven D Labat is a 49 y.o. male.  Patient with red in color.  Past medical history significant for coronary artery disease bipolar disorder hypertension schizophrenic disorder.  Patient denies any abdominal pain but has some epigastric discomfort.  Also has body aches.  Patient was seen September 21 in the ED but left before actually seen by a provider however his Covid test was negative.  Patient not on any blood thinners.        Past Medical History:  Diagnosis Date  . Bipolar 1 disorder (HCC)   . CAD (coronary artery disease)    a. 12/16/13 inferolat STEMI s/p BMS to RPL. b. Cath 08/2014: interim occlusion of the PLA stent of the RCA, collateralized from the left coronary artery with diffuse nonobstructive CAD;  c. STEMI/PCI: LM nl, LAD nl, LCX 1359m/d (2.75x12 synergy DES), RCA 30p, RPDA 75ost/p, RPAV 100 w/ L->R collats.  . Chronic back pain   . HLD (hyperlipidemia)   . Hypertension   . Ischemic cardiomyopathy    a. 12/2013: EF 50-55% by echo with +WMA. b. EF 45% by cath in 08/2014.  . Ischemic cardiomyopathy 05/19/2016  . Peptic ulcer disease   . Schizophrenic disorder (HCC)   . Tobacco abuse     Patient Active Problem List   Diagnosis Date Noted  . NSTEMI (non-ST elevated myocardial infarction) (HCC) 06/30/2018  . Family hx of colon cancer 02/14/2017  . STEMI (ST elevation myocardial infarction) (HCC) 05/19/2016  . Acute ST elevation myocardial infarction (STEMI) involving left circumflex coronary artery without development of Q waves (HCC) 05/19/2016  . Ischemic cardiomyopathy 05/19/2016  . Unstable angina (HCC) 08/06/2014  . ST elevation myocardial infarction involving right coronary artery (HCC)   . CAD (coronary artery disease)   . Tobacco abuse   . HLD (hyperlipidemia)   .  Hypertension   . Family history of premature CAD 12/16/2013  . CAD S/P PCI:  2.0 x 23 vision bare metal stent (2.25 mm) RPL 12/16/2013    Class: Acute  . Schizophrenic disorder (HCC)   . Bipolar 1 disorder [facility](HCC)     Past Surgical History:  Procedure Laterality Date  . CARDIAC CATHETERIZATION N/A 08/07/2014   Procedure: Left Heart Cath and Coronary Angiography;  Surgeon: Tonny BollmanMichael Cooper, MD;  [facility];  Service: Cardiovascular;  Laterality: N/A;  . COLONOSCOPY N/A 06/06/2017   Procedure: COLONOSCOPY;  Surgeon: Malissa Hippoehman, Najeeb U, MD;  [facility];  Service: Endoscopy;  Laterality: N/A;  10:50  . CORONARY STENT INTERVENTION N/A 05/19/2016   Procedure: Coronary Stent Intervention;  Surgeon: Runell GessJonathan J Berry, MD;  Location: [facility] INVASIVE CV LAB;  Service: Cardiovascular;  Laterality: N/A;  . CORONARY STENT INTERVENTION N/A 07/02/2018   Procedure: CORONARY STENT INTERVENTION;  Surgeon: Lyn RecordsSmith, Henry W, MD;  Location: [facility] INVASIVE CV LAB;  Service: Cardiovascular;  Laterality: N/A;  . HERNIA REPAIR    . LEFT HEART CATH AND CORONARY ANGIOGRAPHY N/A 05/19/2016   Procedure: Left Heart Cath and Coronary Angiography;  Surgeon: Runell GessJonathan J Berry, MD;  [facility];  Service: Cardiovascular;  Laterality: N/A;  . LEFT HEART CATH AND CORONARY ANGIOGRAPHY N/A 07/02/2018   Procedure: LEFT HEART CATH AND CORONARY ANGIOGRAPHY;  Surgeon: Lyn RecordsSmith, Henry W, MD;  [facility];  Service: Cardiovascular;  Laterality: N/A;  . LEFT HEART CATHETERIZATION WITH CORONARY ANGIOGRAM N/A 12/16/2013   Procedure: LEFT HEART CATHETERIZATION WITH CORONARY ANGIOGRAM;  Surgeon: Corky Crafts, MD;  Location[facility] CATH LAB;  Service: Cardiovascular;  Laterality: N/A;  . PERCUTANEOUS CORONARY STENT INTERVENTION (PCI-S)  12/16/2013   Procedure: PERCUTANEOUS CORONARY STENT INTERVENTION (PCI-S);  Surgeon: Corky Crafts, MD;  Location: [facility] CATH LAB;  Service: Cardiovascular;;   distal rca       Family History  Problem Relation Age of Onset  . Heart attack Father 37  . Heart attack Brother 41    Social History   Tobacco Use  . Smoking status: Current Every Day Smoker    Packs/day: 1.00    Years: 25.00    Pack years: 25.00    Types: Cigarettes, Cigars    Start date: 05/25/1988  . Smokeless tobacco: Former Neurosurgeon    Types: Snuff    Quit date: 05/21/1988  Vaping Use  . Vaping Use: Never used  Substance Use Topics  . Alcohol use: No    Alcohol/week: 0.0 standard drinks  . Drug use: Yes    Types: Marijuana    Comment: last use x 1 hour    Home Medications Prior to Admission medications   Medication Sig Start Date End Date Taking? Authorizing Provider  aspirin EC 81 MG tablet Take 81 mg by mouth daily.   Yes [provider]  atorvastatin (LIPITOR) 80 MG tablet TAKE 1 TABLET ONCE DAILY. 09/20/18  Yes Branch, Dorothe Pea, MD  carvedilol (COREG) 6.25 MG tablet TAKE 1 TABLET BY MOUTH 2 TIMES A DAY WITH MEALS. Patient taking differently: Take 6.25 mg by mouth 2 (two) times daily with a meal.  08/06/18  Yes Dyann Kief, PA-C  lidocaine (LIDODERM) 5 % Place 1 patch onto the skin daily. Remove & Discard patch within 12 hours or as directed by MD 09/21/19  Yes Triplett, Tammy, PA-C  lisinopril-hydrochlorothiazide (ZESTORETIC) 20-12.5 MG tablet TAKE 1 TABLET ONCE DAILY. 09/20/18  Yes Branch, Dorothe Pea, MD  methocarbamol (ROBAXIN) 500 MG tablet Take 1 tablet (500 mg total) by mouth 3 (three) times daily. 09/21/19  Yes Triplett, Tammy, PA-C  famotidine (PEPCID) 20 MG tablet Take 1 tablet (20 mg total) by mouth 2 (two) times daily. 10/31/19   Vanetta Mulders, MD  nitroGLYCERIN (NITROSTAT) 0.4 MG SL tablet Place 1 tablet (0.4 mg total) under the tongue every 5 (five) minutes x 3 doses as needed for chest pain. 07/02/18   Filbert Schilder, NP  ondansetron (ZOFRAN ODT) 4 MG disintegrating tablet Take 1 tablet (4 mg total) by mouth every 8 (eight) hours as needed.  10/31/19   Vanetta Mulders, MD    Allergies    Suboxone [buprenorphine hcl-naloxone hcl], Benadryl [diphenhydramine], Seroquel [quetiapine], and Tylenol pm extra [diphenhydramine-apap (sleep)]  Review of Systems   Review of Systems  Constitutional: Negative for chills and fever.  HENT: Negative for congestion, rhinorrhea and sore throat.   Eyes: Negative for visual disturbance.  Respiratory: Negative for cough and shortness of breath.   Cardiovascular: Negative for chest pain and leg swelling.  Gastrointestinal: Positive for abdominal pain, diarrhea, nausea and vomiting. Negative for blood in stool.  Genitourinary: Negative for dysuria.  Musculoskeletal: Negative for back pain and neck pain.  Skin: Negative for rash.  Neurological: Negative for dizziness, light-headedness and headaches.  Hematological: Does not bruise/bleed easily.  Psychiatric/Behavioral: Negative for confusion.    Physical Exam Updated Vital Signs  BP 111/63 (BP Location: Left Arm)   Pulse 78   Temp (!) 97.5 F (36.4 C) (Oral)   Resp 15   SpO2 97%   Physical Exam Vitals and nursing note reviewed.  Constitutional:      General: He is not in acute distress.    Appearance: Normal appearance. He is well-developed.  HENT:     Head: Normocephalic and atraumatic.  Eyes:     Extraocular Movements: Extraocular movements intact.     Conjunctiva/sclera: Conjunctivae normal.     Pupils: Pupils are equal, round, and reactive to light.  Cardiovascular:     Rate and Rhythm: Normal rate and regular rhythm.     Heart sounds: No murmur heard.   Pulmonary:     Effort: Pulmonary effort is normal. No respiratory distress.     Breath sounds: Normal breath sounds.  Abdominal:     General: There is no distension.     Palpations: Abdomen is soft. There is no mass.     Tenderness: There is no abdominal tenderness. There is no guarding.  Musculoskeletal:        General: No swelling. Normal range of motion.     Cervical  back: Neck supple.  Skin:    General: Skin is warm and dry.     Capillary Refill: Capillary refill takes less than 2 seconds.  Neurological:     General: No focal deficit present.     Mental Status: He is alert and oriented to person, place, and time.     Cranial Nerves: No cranial nerve deficit.     Sensory: No sensory deficit.     Motor: No weakness.     ED Results / Procedures / Treatments   Labs (all labs ordered are listed, but only abnormal results are displayed) Labs Reviewed  COMPREHENSIVE METABOLIC PANEL - Abnormal; Notable for the following components:      Result Value   Glucose, Bld 103 (*)    All other components within normal limits  CBC - Abnormal; Notable for the following components:   Hemoglobin 17.6 (*)    HCT 52.5 (*)    All other components within normal limits  LIPASE, BLOOD  HEMOGLOBIN AND HEMATOCRIT, BLOOD  URINALYSIS, ROUTINE W REFLEX MICROSCOPIC  TROPONIN I (HIGH SENSITIVITY)  TROPONIN I (HIGH SENSITIVITY)    EKG EKG Interpretation  Date/Time:  Friday October 31 2019 18:56:55 EDT Ventricular Rate:  82 PR Interval:  126 QRS Duration: 99 QT Interval:  391 QTC Calculation: 457 R Axis:   -7 Text Interpretation: Sinus rhythm Abnormal R-wave progression, early transition Inferior infarct, old Confirmed by Vanetta Mulders [phone]) on 10/31/2019 7:01:37 PM   Radiology DG Chest 2 View  Result Date: 10/31/2019 CLINICAL DATA:  Chest discomfort EXAM: CHEST - 2 VIEW COMPARISON:  08/13/2019 chest radiograph and prior. FINDINGS: The heart size and mediastinal contours are within normal limits. Both lungs are clear. No pneumothorax or pleural effusion. No acute osseous abnormality. IMPRESSION: No focal airspace disease. Electronically Signed   By: Stana Bunting M.D.   On: 10/31/2019 15:59   CT Abdomen Pelvis W Contrast  Result Date: 10/31/2019 CLINICAL DATA:  Nausea and vomiting, epigastric pain.  Hematemesis. EXAM: CT ABDOMEN AND PELVIS WITH  CONTRAST TECHNIQUE: Multidetector CT imaging of the abdomen and pelvis was performed using the standard protocol following bolus administration of intravenous contrast. CONTRAST:  OMNIPAQUE IOHEXOL 300 MG/ML  SOLN COMPARISON:  None. FINDINGS: Lower chest: No acute abnormality. Hepatobiliary: No focal liver abnormality  is seen. No gallstones, gallbladder wall thickening, or biliary dilatation. Pancreas: Unremarkable. No pancreatic ductal dilatation or surrounding inflammatory changes. Spleen: Normal in size without focal abnormality. Adrenals/Urinary Tract: No adrenal nodule bilaterally. 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 Bilateral kidneys enhance symmetrically. No radiopaque nephrolithiasis. No hydronephrosis. No hydroureter. The urinary bladder is unremarkable. Stomach/Bowel: Stomach is within normal limits. Appendix is normal in caliber with no periappendiceal fat stranding. An appendicular lith is noted within the appendiceal tip (5:54). No evidence of bowel wall thickening, distention, or inflammatory changes. Vascular/Lymphatic: No abdominal aorta or iliac aneurysm. Mild atherosclerotic plaque of the aorta and its branches. Several prominent retroperitoneal and retrocrural lymph nodes that appear stable compared to prior no abdominal, pelvic, or inguinal lymphadenopathy. Reproductive: Prostate is unremarkable. Other: No intraperitoneal free fluid. No intraperitoneal free gas. No abdominal wall hernia or abnormality No suspicious lytic or blastic osseous lesions. No acute displaced fracture. Multilevel degenerative changes of the spine. Musculoskeletal: Right lower (at the level of the right hip) abdominal/pelvic dermal thickening (2:67, 71) measuring up to 5 mm. No acute or significant osseous findings. IMPRESSION: 1. No acute intra-abdominal or intrapelvic abnormality to explain etiology of patient's symptoms. 2. Indeterminate right lower abdominal wall dermal thickening  measuring up to 5 mm. Correlate with physical exam. 3. Aortic Atherosclerosis (ICD10-I70.0). Electronically Signed   By: Tish Frederickson M.D.   On: 10/31/2019 18:43    Procedures Procedures (including critical care time)  Medications Ordered in ED Medications  0.9 %  sodium chloride infusion ( Intravenous New Bag/Given 10/31/19 1723)  sodium chloride 0.9 % bolus 500 mL (0 mLs Intravenous Stopped 10/31/19 1720)  pantoprazole (PROTONIX) injection 40 mg (40 mg Intravenous Given 10/31/19 1622)  ondansetron (ZOFRAN) injection 4 mg (4 mg Intravenous Given 10/31/19 1717)  HYDROmorphone (DILAUDID) injection 1 mg (1 mg Intravenous Given 10/31/19 1718)  iohexol (OMNIPAQUE) 300 MG/ML solution 100 mL (100 mLs Intravenous Contrast Given 10/31/19 1754)  HYDROmorphone (DILAUDID) injection 1 mg (1 mg Intravenous Given 10/31/19 2109)    ED Course  I have reviewed the triage vital signs and the nursing notes.  Pertinent labs & imaging results that were available during my care of the patient were reviewed by me and considered in my medical decision making (see chart for details).    MDM Rules/Calculators/A&P                          Work hemoglobin was checked twice and is remained stable.  Hemoglobin is 16.7.  Troponins x2 were negative.  Patient talked about some epigastric and chest discomfort.  EKG without acute changes.  Labs without significant abnormalities.  Patient initially had some tachycardia but when I examined him he was not tachycardic and his heart rate now is normal.  Liver function test are normal.  Patient treated with IV fluids.  Patient also treated with Protonix.  Will Goeden discharge home with Zofran and Pepcid.  Will have patient follow-up with GI medicine as well as his primary care medicine.  Dr. Marletta Lor is on call for gastroenterology.    Final Clinical Impression(s) / ED Diagnoses Final diagnoses:  Gastroenteritis  History of hematemesis    Rx / DC Orders ED Discharge Orders          Ordered    famotidine (PEPCID) 20 MG tablet  2 times daily        10/31/19 2148    ondansetron (ZOFRAN ODT) 4 MG disintegrating  tablet  Every 8 hours PRN        10/31/19 2148           Vanetta Mulders, MD 10/31/19 2151

## 2019-10-31 NOTE — Discharge Instructions (Signed)
Take the Pepcid as directed.  Take the Zofran as directed.  Zofran for nausea and vomiting.  Make an appointment to follow-up with Dr. Marletta Lor.  Call to set up an appointment at [phone].   Return for any new or worse symptoms.  Return for vomiting pools of red blood or red blood in the bowel movements.

## 2019-10-31 NOTE — ED Triage Notes (Signed)
States he is vomiting blood onset today and states he has episodes of diarrhea

## 2019-12-19 DIAGNOSIS — R55 Syncope and collapse: Secondary | ICD-10-CM | POA: Insufficient documentation

## 2019-12-19 DIAGNOSIS — J40 Bronchitis, not specified as acute or chronic: Secondary | ICD-10-CM | POA: Insufficient documentation

## 2020-01-01 DIAGNOSIS — G4489 Other headache syndrome: Secondary | ICD-10-CM | POA: Insufficient documentation

## 2020-01-01 DIAGNOSIS — R9389 Abnormal findings on diagnostic imaging of other specified body structures: Secondary | ICD-10-CM | POA: Insufficient documentation

## 2020-01-14 DIAGNOSIS — M4802 Spinal stenosis, cervical region: Secondary | ICD-10-CM | POA: Insufficient documentation

## 2020-03-04 DIAGNOSIS — Z716 Tobacco abuse counseling: Secondary | ICD-10-CM | POA: Insufficient documentation

## 2020-03-04 DIAGNOSIS — U070 Vaping-related disorder: Secondary | ICD-10-CM | POA: Insufficient documentation

## 2020-05-09 ENCOUNTER — Other Ambulatory Visit: Payer: Self-pay

## 2020-05-09 ENCOUNTER — Emergency Department (HOSPITAL_COMMUNITY)
Admission: EM | Admit: 2020-05-09 | Discharge: 2020-05-09 | Disposition: A | Discharge status: Home / Self Care | Payer: Medicaid Other | Attending: Emergency Medicine | Admitting: Emergency Medicine

## 2020-05-09 ENCOUNTER — Encounter (HOSPITAL_COMMUNITY): Payer: Self-pay | Admitting: Emergency Medicine

## 2020-05-09 DIAGNOSIS — I25110 Atherosclerotic heart disease of native coronary artery with unstable angina pectoris: Secondary | ICD-10-CM | POA: Diagnosis not present

## 2020-05-09 DIAGNOSIS — I10 Essential (primary) hypertension: Secondary | ICD-10-CM | POA: Insufficient documentation

## 2020-05-09 DIAGNOSIS — Z79899 Other long term (current) drug therapy: Secondary | ICD-10-CM | POA: Diagnosis not present

## 2020-05-09 DIAGNOSIS — F17210 Nicotine dependence, cigarettes, uncomplicated: Secondary | ICD-10-CM | POA: Diagnosis not present

## 2020-05-09 DIAGNOSIS — Z955 Presence of coronary angioplasty implant and graft: Secondary | ICD-10-CM | POA: Insufficient documentation

## 2020-05-09 DIAGNOSIS — Z7982 Long term (current) use of aspirin: Secondary | ICD-10-CM | POA: Diagnosis not present

## 2020-05-09 DIAGNOSIS — L600 Ingrowing nail: Secondary | ICD-10-CM | POA: Insufficient documentation

## 2020-05-09 DIAGNOSIS — M79674 Pain in right toe(s): Secondary | ICD-10-CM | POA: Diagnosis present

## 2020-05-09 MED ORDER — POVIDONE-IODINE 10 % EX SOLN
CUTANEOUS | Status: AC
  Filled 2020-05-09: qty 15

## 2020-05-09 MED ORDER — LIDOCAINE HCL (PF) 1 % IJ SOLN
INTRAMUSCULAR | Status: AC
  Filled 2020-05-09: qty 5

## 2020-05-09 MED ORDER — IBUPROFEN 400 MG PO TABS
600.0000 mg | ORAL_TABLET | Freq: Once | ORAL | Status: AC
  Administered 2020-05-09: 600 mg via ORAL
  Filled 2020-05-09: qty 2

## 2020-05-09 MED ORDER — CEPHALEXIN 500 MG PO CAPS: 500.0000 mg | ORAL_CAPSULE | Freq: Two times a day (BID) | ORAL | 0 refills | Status: AC

## 2020-05-09 MED ORDER — LIDOCAINE HCL (PF) 1 % IJ SOLN: 10.0000 mL | Freq: Once | INTRAMUSCULAR | Status: DC

## 2020-05-09 NOTE — Discharge Instructions (Signed)
You were seen in the ED for your ingrown toenail.  We were able to resect the part of your toenail that was grown into your skin.  You were placed on antibiotics to take for the next few days.  Follow-up with your primary care doctor.  Return to the ER if you develop any swelling, drainage of pus from the wound, fevers or chills at home.

## 2020-05-09 NOTE — ED Provider Notes (Signed)
[facility] EMERGENCY DEPARTMENT Provider Note   CSN: [id number] Arrival date & time: 05/09/20  1528     History Chief Complaint  Patient presents with  . Toe Pain    Don Fletcher is a 50 y.o. male.  HPI 50 year old male presents with concern for right great toe infection.  He states he has had an ingrown toenail for many months but over the last 3 days it is now painful and red.  He can barely put a sock on.  No fevers or systemic symptoms.   Past Medical History:  Diagnosis Date  . Bipolar 1 disorder (HCC)   . CAD (coronary artery disease)    a. 12/16/13 inferolat STEMI s/p BMS to RPL. b. Cath 08/2014: interim occlusion of the PLA stent of the RCA, collateralized from the left coronary artery with diffuse nonobstructive CAD;  c. STEMI/PCI: LM nl, LAD nl, LCX 151m/d (2.75x12 synergy DES), RCA 30p, RPDA 75ost/p, RPAV 100 w/ L->R collats.  . Chronic back pain   . HLD (hyperlipidemia)   . Hypertension   . Ischemic cardiomyopathy    a. 12/2013: EF 50-55% by echo with +WMA. b. EF 45% by cath in 08/2014.  . Ischemic cardiomyopathy 05/19/2016  . Peptic ulcer disease   . Schizophrenic disorder (HCC)   . Tobacco abuse     Patient Active Problem List   Diagnosis Date Noted  . NSTEMI (non-ST elevated myocardial infarction) (HCC) 06/30/2018  . Family hx of colon cancer 02/14/2017  . STEMI (ST elevation myocardial infarction) (HCC) 05/19/2016  . Acute ST elevation myocardial infarction (STEMI) involving left circumflex coronary artery without development of Q waves (HCC) 05/19/2016  . Ischemic cardiomyopathy 05/19/2016  . Unstable angina (HCC) 08/06/2014  . ST elevation myocardial infarction involving right coronary artery (HCC)   . CAD (coronary artery disease)   . Tobacco abuse   . HLD (hyperlipidemia)   . Hypertension   . Family history of premature CAD 12/16/2013  . CAD S/P PCI:  2.0 x 23 vision bare metal stent (2.25 mm) RPL 12/16/2013    Class: Acute  . Schizophrenic  disorder (HCC)   . Bipolar 1 disorder [facility])     Past Surgical History:  Procedure Laterality Date  . CARDIAC CATHETERIZATION N/A 08/07/2014   Procedure: Left Heart Cath and Coronary Angiography;  Surgeon: Tonny Bollman, MD;  Location: [facility] INVASIVE CV LAB;  Service: Cardiovascular;  Laterality: N/A;  . COLONOSCOPY N/A 06/06/2017   Procedure: COLONOSCOPY;  Surgeon: Malissa Hippo, MD;  Location: [facility] ENDO [facility];  Service: Endoscopy;  Laterality: N/A;  10:50  . CORONARY STENT INTERVENTION N/A 05/19/2016   Procedure: Coronary Stent Intervention;  Surgeon: Runell Gess, MD;  Location: [facility] INVASIVE CV LAB;  Service: Cardiovascular;  Laterality: N/A;  . CORONARY STENT INTERVENTION N/A 07/02/2018   Procedure: CORONARY STENT INTERVENTION;  Surgeon: Lyn Records, MD;  Location: [facility] INVASIVE CV LAB;  Service: Cardiovascular;  Laterality: N/A;  . HERNIA REPAIR    . LEFT HEART CATH AND CORONARY ANGIOGRAPHY N/A 05/19/2016   Procedure: Left Heart Cath and Coronary Angiography;  Surgeon: Runell Gess, MD;  Location: [facility] INVASIVE CV LAB;  Service: Cardiovascular;  Laterality: N/A;  . LEFT HEART CATH AND CORONARY ANGIOGRAPHY N/A 07/02/2018   Procedure: LEFT HEART CATH AND CORONARY ANGIOGRAPHY;  Surgeon: Lyn Records, MD;  Location: [facility] INVASIVE CV LAB;  Service: Cardiovascular;  Laterality: N/A;  . LEFT HEART CATHETERIZATION WITH CORONARY ANGIOGRAM N/A 12/16/2013   Procedure: LEFT HEART  CATHETERIZATION WITH CORONARY ANGIOGRAM;  Surgeon: Corky Crafts, MD;  Location: [facility] CATH LAB;  Service: Cardiovascular;  Laterality: N/A;  . PERCUTANEOUS CORONARY STENT INTERVENTION (PCI-S)  12/16/2013   Procedure: PERCUTANEOUS CORONARY STENT INTERVENTION (PCI-S);  Surgeon: Corky Crafts, MD;  Location: [facility] CATH LAB;  Service: Cardiovascular;;  distal rca       Family History  Problem Relation Age of Onset  . Heart attack Father 29  . Heart attack Brother 79    Social History   Tobacco Use  . Smoking  status: Current Every Day Smoker    Packs/day: 1.00    Years: 25.00    Pack years: 25.00    Types: Cigarettes, Cigars    Start date: 05/25/1988  . Smokeless tobacco: Former Neurosurgeon    Types: Snuff    Quit date: 05/21/1988  Vaping Use  . Vaping Use: Never used  Substance Use Topics  . Alcohol use: No    Alcohol/week: 0.0 standard drinks  . Drug use: Yes    Types: Marijuana    Comment: last use x 1 hour    Home Medications Prior to Admission medications   Medication Sig Start Date End Date Taking? Authorizing Provider  cephALEXin (KEFLEX) 500 MG capsule Take 1 capsule (500 mg total) by mouth 2 (two) times daily for 5 days. 05/09/20 05/14/20 Yes Sponseller, Eugene Gavia, PA-C  aspirin EC 81 MG tablet Take 81 mg by mouth daily.    [provider]  atorvastatin (LIPITOR) 80 MG tablet TAKE 1 TABLET ONCE DAILY. 09/20/18   Antoine Poche, MD  carvedilol (COREG) 6.25 MG tablet TAKE 1 TABLET BY MOUTH 2 TIMES A DAY WITH MEALS. Patient taking differently: Take 6.25 mg by mouth 2 (two) times daily with a meal.  08/06/18   Dyann Kief, PA-C  famotidine (PEPCID) 20 MG tablet Take 1 tablet (20 mg total) by mouth 2 (two) times daily. 10/31/19   Vanetta Mulders, MD  lidocaine (LIDODERM) 5 % Place 1 patch onto the skin daily. Remove & Discard patch within 12 hours or as directed by MD 09/21/19   Triplett, Tammy, PA-C  lisinopril-hydrochlorothiazide (ZESTORETIC) 20-12.5 MG tablet TAKE 1 TABLET ONCE DAILY. 09/20/18   Antoine Poche, MD  methocarbamol (ROBAXIN) 500 MG tablet Take 1 tablet (500 mg total) by mouth 3 (three) times daily. 09/21/19   Triplett, Tammy, PA-C  nitroGLYCERIN (NITROSTAT) 0.4 MG SL tablet Place 1 tablet (0.4 mg total) under the tongue every 5 (five) minutes x 3 doses as needed for chest pain. 07/02/18   Filbert Schilder, NP  ondansetron (ZOFRAN ODT) 4 MG disintegrating tablet Take 1 tablet (4 mg total) by mouth every 8 (eight) hours as needed. 10/31/19   Vanetta Mulders, MD     Allergies    Suboxone [buprenorphine hcl-naloxone hcl], Benadryl [diphenhydramine], Seroquel [quetiapine], and Tylenol pm extra [diphenhydramine-apap (sleep)]  Review of Systems   Review of Systems  Constitutional: Negative for fever.  Musculoskeletal: Positive for arthralgias and joint swelling.  Skin: Positive for color change.    Physical Exam Updated Vital Signs BP (!) 161/94 (BP Location: Right Arm)   Pulse 64   Temp 97.6 F (36.4 C) (Oral)   Resp 16   Ht 5\' 7"  (1.702 m)   SpO2 97%   BMI 29.76 kg/m   Physical Exam Vitals and nursing note reviewed.  Constitutional:      Appearance: He is well-developed.  HENT:     Head: Normocephalic and atraumatic.  Right Ear: External ear normal.     Left Ear: External ear normal.     Nose: Nose normal.  Eyes:     General:        Right eye: No discharge.        Left eye: No discharge.  Cardiovascular:     Rate and Rhythm: Normal rate and regular rhythm.     Pulses:          Posterior tibial pulses are 2+ on the right side.  Pulmonary:     Effort: Pulmonary effort is normal.  Abdominal:     General: There is no distension.  Musculoskeletal:     Cervical back: Neck supple.     Comments: Right great toe with mild swelling and redness distally and medially. Both sides of the nail appear ingrown  Skin:    General: Skin is warm and dry.  Neurological:     Mental Status: He is alert.  Psychiatric:        Mood and Affect: Mood is not anxious.     ED Results / Procedures / Treatments   Labs (all labs ordered are listed, but only abnormal results are displayed) Labs Reviewed - No data to display  EKG None  Radiology No results found.  Procedures Procedures   Medications Ordered in ED Medications  lidocaine (PF) (XYLOCAINE) 1 % injection 10 mL (has no administration in time range)  povidone-iodine (BETADINE) 10 % external solution (has no administration in time range)  lidocaine (PF) (XYLOCAINE) 1 %  injection (has no administration in time range)  ibuprofen (ADVIL) tablet 600 mg (600 mg Oral Given 05/09/20 1642)    ED Course  I have reviewed the triage vital signs and the nursing notes.  Pertinent labs & imaging results that were available during my care of the patient were reviewed by me and considered in my medical decision making (see chart for details).    MDM Rules/Calculators/A&P                          Patient appears to have localized infection from toenail. Please see PA Sponseller's note for procedure. D/c home with antibiotics.  Final Clinical Impression(s) / ED Diagnoses Final diagnoses:  Ingrown toenail    Rx / DC Orders ED Discharge Orders         Ordered    cephALEXin (KEFLEX) 500 MG capsule  2 times daily        05/09/20 1938           Pricilla Loveless, MD 05/10/20 [phone]

## 2020-05-09 NOTE — ED Triage Notes (Signed)
Patient c/o pain and redness to right great toe, worsening x 3 days.

## 2020-05-09 NOTE — ED Notes (Signed)
ED Provider at bedside. 

## 2020-05-09 NOTE — ED Provider Notes (Signed)
Performed wedge resection of the right great toe with recurrent toenail on the medial aspect.  Digital block was placed using 1% lidocaine without epinephrine, with sufficient anesthesia.  Blunt dissection was performed utilizing a hemostat ingrown portion was removed lateral rotation of the hemostat and traction.  Patient tolerated the procedure well, wound is hemostatic at this time.  Placed on antibiotics and discharged home with follow-up with PCP.    Paris Lore, PA-C 05/09/20 1944    Pricilla Loveless, MD 05/12/20 [phone]

## 2020-05-17 ENCOUNTER — Encounter (HOSPITAL_COMMUNITY): Payer: Self-pay | Admitting: *Deleted

## 2020-05-17 ENCOUNTER — Emergency Department (HOSPITAL_COMMUNITY): Payer: Medicaid Other

## 2020-05-17 ENCOUNTER — Other Ambulatory Visit: Payer: Self-pay

## 2020-05-17 ENCOUNTER — Emergency Department (HOSPITAL_COMMUNITY)
Admission: EM | Admit: 2020-05-17 | Discharge: 2020-05-17 | Disposition: A | Discharge status: Home / Self Care | Payer: Medicaid Other | Attending: Emergency Medicine | Admitting: Emergency Medicine

## 2020-05-17 DIAGNOSIS — Z5321 Procedure and treatment not carried out due to patient leaving prior to being seen by health care provider: Secondary | ICD-10-CM | POA: Insufficient documentation

## 2020-05-17 DIAGNOSIS — R079 Chest pain, unspecified: Secondary | ICD-10-CM | POA: Diagnosis not present

## 2020-05-17 LAB — CBC
HCT: 51.3 % (ref 39.0–52.0)
Hemoglobin: 17.6 g/dL — ABNORMAL HIGH (ref 13.0–17.0)
MCH: 31.9 pg (ref 26.0–34.0)
MCHC: 34.3 g/dL (ref 30.0–36.0)
MCV: 92.9 fL (ref 80.0–100.0)
Platelets: 264 10*3/uL (ref 150–400)
RBC: 5.52 MIL/uL (ref 4.22–5.81)
RDW: 12.9 % (ref 11.5–15.5)
WBC: 7.9 10*3/uL (ref 4.0–10.5)
nRBC: 0 % (ref 0.0–0.2)

## 2020-05-17 LAB — BASIC METABOLIC PANEL
Anion gap: 11 (ref 5–15)
BUN: 9 mg/dL (ref 6–20)
CO2: 20 mmol/L — ABNORMAL LOW (ref 22–32)
Calcium: 8.9 mg/dL (ref 8.9–10.3)
Chloride: 105 mmol/L (ref 98–111)
Creatinine, Ser: 0.98 mg/dL (ref 0.61–1.24)
GFR, Estimated: >60 mL/min (ref >60)
Glucose, Bld: 119 mg/dL — ABNORMAL HIGH (ref 70–99)
Potassium: 3.8 mmol/L (ref 3.5–5.1)
Sodium: 136 mmol/L (ref 135–145)

## 2020-05-17 LAB — TROPONIN I (HIGH SENSITIVITY): Troponin I (High Sensitivity): 3 ng/L (ref <18)

## 2020-05-17 NOTE — ED Triage Notes (Signed)
Chest pain x 4 days 

## 2020-07-23 ENCOUNTER — Other Ambulatory Visit: Payer: Self-pay

## 2020-07-23 ENCOUNTER — Emergency Department (HOSPITAL_COMMUNITY): Payer: Medicaid Other

## 2020-07-23 ENCOUNTER — Encounter (HOSPITAL_COMMUNITY): Payer: Self-pay

## 2020-07-23 ENCOUNTER — Emergency Department (HOSPITAL_COMMUNITY)
Admission: EM | Admit: 2020-07-23 | Discharge: 2020-07-23 | Disposition: A | Discharge status: Home / Self Care | Payer: Medicaid Other | Attending: Emergency Medicine | Admitting: Emergency Medicine

## 2020-07-23 DIAGNOSIS — R0789 Other chest pain: Secondary | ICD-10-CM

## 2020-07-23 DIAGNOSIS — Z20822 Contact with and (suspected) exposure to covid-19: Secondary | ICD-10-CM | POA: Diagnosis not present

## 2020-07-23 DIAGNOSIS — I10 Essential (primary) hypertension: Secondary | ICD-10-CM | POA: Diagnosis not present

## 2020-07-23 DIAGNOSIS — F17210 Nicotine dependence, cigarettes, uncomplicated: Secondary | ICD-10-CM | POA: Insufficient documentation

## 2020-07-23 DIAGNOSIS — Z79899 Other long term (current) drug therapy: Secondary | ICD-10-CM | POA: Diagnosis not present

## 2020-07-23 DIAGNOSIS — Z7982 Long term (current) use of aspirin: Secondary | ICD-10-CM | POA: Diagnosis not present

## 2020-07-23 DIAGNOSIS — I2510 Atherosclerotic heart disease of native coronary artery without angina pectoris: Secondary | ICD-10-CM | POA: Insufficient documentation

## 2020-07-23 LAB — TROPONIN I (HIGH SENSITIVITY)
Troponin I (High Sensitivity): 4 ng/L (ref <18)
Troponin I (High Sensitivity): 5 ng/L (ref <18)

## 2020-07-23 LAB — HEPATIC FUNCTION PANEL
ALT: 28 U/L (ref 0–44)
AST: 24 U/L (ref 15–41)
Albumin: 3.8 g/dL (ref 3.5–5.0)
Alkaline Phosphatase: 81 U/L (ref 38–126)
Bilirubin, Direct: 0.1 mg/dL (ref 0.0–0.2)
Indirect Bilirubin: 0.4 mg/dL (ref 0.3–0.9)
Total Bilirubin: 0.5 mg/dL (ref 0.3–1.2)
Total Protein: 7 g/dL (ref 6.5–8.1)

## 2020-07-23 LAB — BASIC METABOLIC PANEL
Anion gap: 8 (ref 5–15)
BUN: 10 mg/dL (ref 6–20)
CO2: 23 mmol/L (ref 22–32)
Calcium: 8.9 mg/dL (ref 8.9–10.3)
Chloride: 108 mmol/L (ref 98–111)
Creatinine, Ser: 0.93 mg/dL (ref 0.61–1.24)
GFR, Estimated: >60 mL/min (ref >60)
Glucose, Bld: 98 mg/dL (ref 70–99)
Potassium: 3.5 mmol/L (ref 3.5–5.1)
Sodium: 139 mmol/L (ref 135–145)

## 2020-07-23 LAB — CBC
HCT: 48 % (ref 39.0–52.0)
Hemoglobin: 16.3 g/dL (ref 13.0–17.0)
MCH: 31.8 pg (ref 26.0–34.0)
MCHC: 34 g/dL (ref 30.0–36.0)
MCV: 93.6 fL (ref 80.0–100.0)
Platelets: 231 10*3/uL (ref 150–400)
RBC: 5.13 MIL/uL (ref 4.22–5.81)
RDW: 13.2 % (ref 11.5–15.5)
WBC: 9.4 10*3/uL (ref 4.0–10.5)
nRBC: 0 % (ref 0.0–0.2)

## 2020-07-23 LAB — RESP PANEL BY RT-PCR (FLU A&B, COVID) ARPGX2
Influenza A by PCR: NEGATIVE
Influenza B by PCR: NEGATIVE
SARS Coronavirus 2 by RT PCR: NEGATIVE

## 2020-07-23 LAB — LIPASE, BLOOD: Lipase: 31 U/L (ref 11–51)

## 2020-07-23 MED ORDER — SODIUM CHLORIDE 0.9 % IV BOLUS
1000.0000 mL | Freq: Once | INTRAVENOUS | Status: AC
  Administered 2020-07-23: 1000 mL via INTRAVENOUS

## 2020-07-23 NOTE — ED Provider Notes (Signed)
[facility] EMERGENCY DEPARTMENT Provider Note   CSN: [id number] Arrival date & time: 07/23/20  1118     History Chief Complaint  Patient presents with   Chest Pain    Don Fletcher is a 50 y.o. male.  Patient states that 2 days ago 3am in the morning he started having some chest discomfort and sweating.  Last about 30 minutes then he threw up for about 3 hours  The history is provided by the patient. No language interpreter was used.  Chest Pain Pain location:  L chest Pain quality: aching   Pain radiates to:  Does not radiate Pain severity:  Moderate Onset quality:  Sudden Duration: 30 minutes. Timing:  Intermittent Progression:  Waxing and waning Chronicity:  New Context: not breathing   Relieved by:  Nothing Worsened by:  Nothing Associated symptoms: no abdominal pain, no back pain, no cough, no fatigue and no headache       Past Medical History:  Diagnosis Date   Bipolar 1 disorder (HCC)    CAD (coronary artery disease)    a. 12/16/13 inferolat STEMI s/p BMS to RPL. b. Cath 08/2014: interim occlusion of the PLA stent of the RCA, collateralized from the left coronary artery with diffuse nonobstructive CAD;  c. STEMI/PCI: LM nl, LAD nl, LCX 115m/d (2.75x12 synergy DES), RCA 30p, RPDA 75ost/p, RPAV 100 w/ L->R collats.   Chronic back pain    HLD (hyperlipidemia)    Hypertension    Ischemic cardiomyopathy    a. 12/2013: EF 50-55% by echo with +WMA. b. EF 45% by cath in 08/2014.   Ischemic cardiomyopathy 05/19/2016   Peptic ulcer disease    Schizophrenic disorder (HCC)    Tobacco abuse     Patient Active Problem List   Diagnosis Date Noted   NSTEMI (non-ST elevated myocardial infarction) (HCC) 06/30/2018   Family hx of colon cancer 02/14/2017   STEMI (ST elevation myocardial infarction) (HCC) 05/19/2016   Acute ST elevation myocardial infarction (STEMI) involving left circumflex coronary artery without development of Q waves (HCC) 05/19/2016   Ischemic  cardiomyopathy 05/19/2016   Unstable angina (HCC) 08/06/2014   ST elevation myocardial infarction involving right coronary artery (HCC)    CAD (coronary artery disease)    Tobacco abuse    HLD (hyperlipidemia)    Hypertension    Family history of premature CAD 12/16/2013   CAD S/P PCI:  2.0 x 23 vision bare metal stent (2.25 mm) RPL 12/16/2013    Class: Acute   Schizophrenic disorder (HCC)    Bipolar 1 disorder (HCC)     Past Surgical History:  Procedure Laterality Date   CARDIAC CATHETERIZATION N/A 08/07/2014   Procedure: Left Heart Cath and Coronary Angiography;  Surgeon: Tonny Bollman, MD;  Location: [facility] INVASIVE CV LAB;  Service: Cardiovascular;  Laterality: N/A;   COLONOSCOPY N/A 06/06/2017   Procedure: COLONOSCOPY;  Surgeon: Malissa Hippo, MD;  Location: [facility] ENDO SU[facility];  Service: Endoscopy;  Laterality: N/A;  10:50   CORONARY STENT INTERVENTION N/A 05/19/2016   Procedure: Coronary Stent Intervention;  Surgeon: Runell Gess, MD;  Location: [facility] INVASIVE CV LAB;  Service: Cardiovascular;  Laterality: N/A;   CORONARY STENT INTERVENTION N/A 07/02/2018   Procedure: CORONARY STENT INTERVENTION;  Surgeon: Lyn Records, MD;  Location: [facility] INVASIVE CV LAB;  Service: Cardiovascular;  Laterality: N/A;   HERNIA REPAIR     LEFT HEART CATH AND CORONARY ANGIOGRAPHY N/A 05/19/2016   Procedure: Left Heart Cath and Coronary Angiography;  Surgeon: Runell Gess, MD;  Location: [facility] INVASIVE CV LAB;  Service: Cardiovascular;  Laterality: N/A;   LEFT HEART CATH AND CORONARY ANGIOGRAPHY N/A 07/02/2018   Procedure: LEFT HEART CATH AND CORONARY ANGIOGRAPHY;  Surgeon: Lyn Records, MD;  Location: [facility] INVASIVE CV LAB;  Service: Cardiovascular;  Laterality: N/A;   LEFT HEART CATHETERIZATION WITH CORONARY ANGIOGRAM N/A 12/16/2013   Procedure: LEFT HEART CATHETERIZATION WITH CORONARY ANGIOGRAM;  Surgeon: Corky Crafts, MD;  Location: [facility] CATH LAB;  Service: Cardiovascular;  Laterality: N/A;    PERCUTANEOUS CORONARY STENT INTERVENTION (PCI-S)  12/16/2013   Procedure: PERCUTANEOUS CORONARY STENT INTERVENTION (PCI-S);  Surgeon: Corky Crafts, MD;  Location: [facility] CATH LAB;  Service: Cardiovascular;;  distal rca       Family History  Problem Relation Age of Onset   Heart attack Father 62   Heart attack Brother 15    Social History   Tobacco Use   Smoking status: Every Day    Packs/day: 1.00    Years: 25.00    Pack years: 25.00    Types: Cigarettes, Cigars    Start date: 05/25/1988   Smokeless tobacco: Former    Types: Snuff    Quit date: 05/21/1988  Vaping Use   Vaping Use: Never used  Substance Use Topics   Alcohol use: No    Alcohol/week: 0.0 standard drinks   Drug use: Yes    Types: Marijuana    Comment: last used today    Home Medications Prior to Admission medications   Medication Sig Start Date End Date Taking? Authorizing Provider  aspirin EC 81 MG tablet Take 81 mg by mouth daily.   Yes [provider]  atorvastatin (LIPITOR) 80 MG tablet TAKE 1 TABLET ONCE DAILY. Patient taking differently: Take 80 mg by mouth daily. 09/20/18  Yes Branch, Dorothe Pea, MD  carvedilol (COREG) 6.25 MG tablet TAKE 1 TABLET BY MOUTH 2 TIMES A DAY WITH MEALS. Patient taking differently: Take 6.25 mg by mouth 2 (two) times daily with a meal. 08/06/18  Yes Dyann Kief, PA-C  famotidine (PEPCID) 20 MG tablet Take 1 tablet (20 mg total) by mouth 2 (two) times daily. 10/31/19  Yes Vanetta Mulders, MD  lisinopril-hydrochlorothiazide (ZESTORETIC) 20-12.5 MG tablet TAKE 1 TABLET ONCE DAILY. Patient taking differently: Take 1 tablet by mouth daily. 09/20/18  Yes Branch, Dorothe Pea, MD  methocarbamol (ROBAXIN) 500 MG tablet Take 1 tablet (500 mg total) by mouth 3 (three) times daily. 09/21/19  Yes Triplett, Tammy, PA-C  ondansetron (ZOFRAN ODT) 4 MG disintegrating tablet Take 1 tablet (4 mg total) by mouth every 8 (eight) hours as needed. Patient taking differently: Take 4 mg  by mouth every 8 (eight) hours as needed for nausea or vomiting. 10/31/19  Yes Vanetta Mulders, MD  lidocaine (LIDODERM) 5 % Place 1 patch onto the skin daily. Remove & Discard patch within 12 hours or as directed by MD Patient not taking: No sig reported 09/21/19   Triplett, Tammy, PA-C  nitroGLYCERIN (NITROSTAT) 0.4 MG SL tablet Place 1 tablet (0.4 mg total) under the tongue every 5 (five) minutes x 3 doses as needed for chest pain. 07/02/18   Georgie Chard D, NP    Allergies    Suboxone [buprenorphine hcl-naloxone hcl], Benadryl [diphenhydramine], Seroquel [quetiapine], and Tylenol pm extra [diphenhydramine-apap (sleep)]  Review of Systems   Review of Systems  Constitutional:  Negative for appetite change and fatigue.  HENT:  Negative for congestion, ear discharge and sinus pressure.  Eyes:  Negative for discharge.  Respiratory:  Negative for cough.   Cardiovascular:  Positive for chest pain.  Gastrointestinal:  Negative for abdominal pain and diarrhea.  Genitourinary:  Negative for frequency and hematuria.  Musculoskeletal:  Negative for back pain.  Skin:  Negative for rash.  Neurological:  Negative for seizures and headaches.  Psychiatric/Behavioral:  Negative for hallucinations.    Physical Exam Updated Vital Signs BP 100/83   Pulse 74   Temp 97.7 F (36.5 C) (Oral)   Resp 17   Ht 5\' 7"  (1.702 m)   Wt 77.1 kg   SpO2 94%   BMI 26.63 kg/m   Physical Exam Vitals and nursing note reviewed.  Constitutional:      Appearance: He is well-developed.  HENT:     Head: Normocephalic.     Mouth/Throat:     Mouth: Mucous membranes are moist.  Eyes:     General: No scleral icterus.    Conjunctiva/sclera: Conjunctivae normal.  Neck:     Thyroid: No thyromegaly.  Cardiovascular:     Rate and Rhythm: Normal rate and regular rhythm.     Heart sounds: No murmur heard.   No friction rub. No gallop.  Pulmonary:     Breath sounds: No stridor. No wheezing or rales.  Chest:      Chest wall: No tenderness.  Abdominal:     General: There is no distension.     Tenderness: There is no abdominal tenderness. There is no rebound.  Musculoskeletal:        General: Normal range of motion.     Cervical back: Neck supple.  Lymphadenopathy:     Cervical: No cervical adenopathy.  Skin:    Findings: No erythema or rash.  Neurological:     Mental Status: He is oriented to person, place, and time.     Motor: No abnormal muscle tone.     Coordination: Coordination normal.  Psychiatric:        Behavior: Behavior normal.    ED Results / Procedures / Treatments   Labs (all labs ordered are listed, but only abnormal results are displayed) Labs Reviewed  RESP PANEL BY RT-PCR (FLU A&B, COVID) ARPGX2  BASIC METABOLIC PANEL  CBC  HEPATIC FUNCTION PANEL  LIPASE, BLOOD  URINALYSIS, ROUTINE W REFLEX MICROSCOPIC  TROPONIN I (HIGH SENSITIVITY)  TROPONIN I (HIGH SENSITIVITY)    EKG EKG Interpretation  Date/Time:  Friday July 23 2020 11:34:20 EDT Ventricular Rate:  100 PR Interval:  140 QRS Duration: 96 QT Interval:  348 QTC Calculation: 448 R Axis:   81 Text Interpretation: Normal sinus rhythm Right atrial enlargement Borderline ECG Confirmed by [id number] [phone]) on 07/23/2020 11:54:53 AM  Radiology DG Chest 2 View  Result Date: 07/23/2020 CLINICAL DATA:  Chest pain and shortness of breath EXAM: CHEST - 2 VIEW COMPARISON:  May 17, 2020 FINDINGS: No edema or airspace opacity. Interstitium is mildly thickened. Heart size and pulmonary vascularity are normal. No. No bone lesions. IMPRESSION: Interstitial thickening, likely representing a degree of chronic bronchitis. No edema or airspace opacity. Cardiac silhouette normal. Electronically Signed   By: May 19, 2020 III M.D.   On: 07/23/2020 12:14    Procedures Procedures   Medications Ordered in ED Medications  sodium chloride 0.9 % bolus 1,000 mL (1,000 mLs Intravenous New Bag/Given 07/23/20 1215)    ED  Course  I have reviewed the triage vital signs and the nursing notes.  Pertinent labs & imaging results that were available  during my care of the patient were reviewed by me and considered in my medical decision making (see chart for details). Patient with chest discomfort 2 days ago and normal labs with no acute changes EKG.  I spoke with Dr. Wyline MoodBranch his cardiologist and he felt like he would be followed up next week   MDM Rules/Calculators/A&P                          Atypical chest pain.  Patient will be discharged home to follow-up with cardiology Final Clinical Impression(s) / ED Diagnoses Final diagnoses:  Atypical chest pain    Rx / DC Orders ED Discharge Orders     None        Bethann BerkshireZammit, Felicha Frayne, MD 07/26/20 1653

## 2020-07-23 NOTE — Discharge Instructions (Addendum)
Follow-up with Dr. Wyline Mood next week.  Call to make an appointment.  Return if any problems.  Rest this weekend and drink plenty of fluids

## 2020-07-23 NOTE — ED Triage Notes (Signed)
Pt presents to ED with complaints of mid chest pain x 1 episode yesterday morning at 0300, "felt like elephant sitting on chest", diaphoretic, SOB lasted 30 minutes then resolved, went to PCP this morning and was sent to ED for evaluation.

## 2020-08-20 ENCOUNTER — Encounter: Payer: Self-pay | Admitting: *Deleted

## 2020-08-20 ENCOUNTER — Ambulatory Visit: Payer: Medicaid Other | Admitting: Family Medicine

## 2020-08-20 ENCOUNTER — Encounter: Payer: Self-pay | Admitting: Family Medicine

## 2020-08-20 VITALS — BP 126/80 | HR 71 | Ht 66.0 in | Wt 169.8 lb

## 2020-08-20 DIAGNOSIS — I2510 Atherosclerotic heart disease of native coronary artery without angina pectoris: Secondary | ICD-10-CM

## 2020-08-20 DIAGNOSIS — R079 Chest pain, unspecified: Secondary | ICD-10-CM | POA: Diagnosis not present

## 2020-08-20 DIAGNOSIS — E782 Mixed hyperlipidemia: Secondary | ICD-10-CM | POA: Diagnosis not present

## 2020-08-20 DIAGNOSIS — I10 Essential (primary) hypertension: Secondary | ICD-10-CM

## 2020-08-20 NOTE — Patient Instructions (Signed)
Medication Instructions:  Continue all current medications.  Labwork: none  Testing/Procedures: Your physician has requested that you have a lexiscan myoview. For further information please visit [email or web address] Please follow instruction sheet, as given. Office will contact with results via phone or letter.    Follow-Up: 6-8 weeks   Any Other Special Instructions Will Be Listed Below (If Applicable).  If you need a refill on your cardiac medications before your next appointment, please call your pharmacy.

## 2020-08-20 NOTE — Progress Notes (Signed)
Cardiology Office Note  Date: 08/22/2020   ID: Don Fletcher, DOB 1971/01/06, MRN [id number]  PCP:  [name], MD  Cardiologist:  Dina Rich, MD Electrophysiologist:  None   Chief Complaint: ED follow up [facility]  History of Present Illness: Don Fletcher is a 50 y.o. male with a history of CAD NSTEMI/STEMI, HLD, HTN, ischemic cardiomyopathy, tobacco abuse, PUD.  Patient presented to [facility] emergency department 07/23/2020 stating that 2 days prior in the morning he began  having some diarrhea, chest discomfort, and sweating.  Lasted approximately 30 minutes.  He vomited for approximately 3 hours.  He had multiple labs.  EKG showed normal sinus rhythm with right atrial enlargement rate of 100.  Chest x-ray demonstrated interstitial thickening likely representing a degree of chronic bronchitis.  No edema, or airspace opacity.  Cardiac silhouette was normal.  He received IV fluids.  ER provider spoke with Dr. Wyline Mood who felt patient could be followed up next week.  Diagnosis was atypical chest pain. Initial troponin was 4.  Subsequent troponin was 5.  Lipase was 31, LFTs were normal, respiratory panel was negative.  CBC unremarkable.  Basic metabolic panel unremarkable except for glucose of 119. He was discharged.  He presents today for follow up. He states he continues to have intermittent chest pain and with associated nausea. He mentioned that he has a significant history of substance abuse. He states in the past he has used nearly every possible recreational substance. He continues to smoke. He denies any SOB of DOE/ orthostatic symptoms other than some lightheadedness recently associated with the chest pain episodes.. No near syncope or syncope. No bleeding issues. Denies any dyspeptic symptoms or feeling of epigastric pain after eating. Denies and blood in stool or urine.    Past Medical History:  Diagnosis Date   Bipolar 1 disorder (HCC)    CAD (coronary artery disease)     a. 12/16/13 inferolat STEMI s/p BMS to RPL. b. Cath 08/2014: interim occlusion of the PLA stent of the RCA, collateralized from the left coronary artery with diffuse nonobstructive CAD;  c. STEMI/PCI: LM nl, LAD nl, LCX 159m/d (2.75x12 synergy DES), RCA 30p, RPDA 75ost/p, RPAV 100 w/ L->R collats.   Chronic back pain    HLD (hyperlipidemia)    Hypertension    Ischemic cardiomyopathy    a. 12/2013: EF 50-55% by echo with +WMA. b. EF 45% by cath in 08/2014.   Ischemic cardiomyopathy 05/19/2016   Peptic ulcer disease    Schizophrenic disorder (HCC)    Tobacco abuse     Past Surgical History:  Procedure Laterality Date   CARDIAC CATHETERIZATION N/A 08/07/2014   Procedure: Left Heart Cath and Coronary Angiography;  Surgeon: Tonny Bollman, MD;  Location: [facility] INVASIVE CV LAB;  Service: Cardiovascular;  Laterality: N/A;   COLONOSCOPY N/A 06/06/2017   Procedure: COLONOSCOPY;  Surgeon: Malissa Hippo, MD;  Location: [facility];  Service: Endoscopy;  Laterality: N/A;  10:50   CORONARY STENT INTERVENTION N/A 05/19/2016   Procedure: Coronary Stent Intervention;  Surgeon: Runell Gess, MD;  Location: [facility] INVASIVE CV LAB;  Service: Cardiovascular;  Laterality: N/A;   CORONARY STENT INTERVENTION N/A 07/02/2018   Procedure: CORONARY STENT INTERVENTION;  Surgeon: Lyn Records, MD;  Location: [facility] INVASIVE CV LAB;  Service: Cardiovascular;  Laterality: N/A;   HERNIA REPAIR     LEFT HEART CATH AND CORONARY ANGIOGRAPHY N/A 05/19/2016   Procedure: Left Heart Cath and Coronary Angiography;  Surgeon:  Runell Gess, MD;  Location: [facility] INVASIVE CV LAB;  Service: Cardiovascular;  Laterality: N/A;   LEFT HEART CATH AND CORONARY ANGIOGRAPHY N/A 07/02/2018   Procedure: LEFT HEART CATH AND CORONARY ANGIOGRAPHY;  Surgeon: Lyn Records, MD;  Location: [facility] INVASIVE CV LAB;  Service: Cardiovascular;  Laterality: N/A;   LEFT HEART CATHETERIZATION WITH CORONARY ANGIOGRAM N/A 12/16/2013   Procedure: LEFT HEART CATHETERIZATION  WITH CORONARY ANGIOGRAM;  Surgeon: Corky Crafts, MD;  Location: [facility] CATH LAB;  Service: Cardiovascular;  Laterality: N/A;   PERCUTANEOUS CORONARY STENT INTERVENTION (PCI-S)  12/16/2013   Procedure: PERCUTANEOUS CORONARY STENT INTERVENTION (PCI-S);  Surgeon: Corky Crafts, MD;  Location: [facility] CATH LAB;  Service: Cardiovascular;;  distal rca    Current Outpatient Medications  Medication Sig Dispense Refill   aspirin EC 81 MG tablet Take 81 mg by mouth daily.     atorvastatin (LIPITOR) 80 MG tablet TAKE 1 TABLET ONCE DAILY. (Patient taking differently: Take 80 mg by mouth daily.) 30 tablet 6   carvedilol (COREG) 6.25 MG tablet TAKE 1 TABLET BY MOUTH 2 TIMES A DAY WITH MEALS. (Patient taking differently: Take 6.25 mg by mouth 2 (two) times daily with a meal.) 30 tablet 0   famotidine (PEPCID) 20 MG tablet Take 1 tablet (20 mg total) by mouth 2 (two) times daily. 30 tablet 0   lidocaine (LIDODERM) 5 % Place 1 patch onto the skin daily. Remove & Discard patch within 12 hours or as directed by MD 30 patch 0   lisinopril-hydrochlorothiazide (ZESTORETIC) 20-12.5 MG tablet TAKE 1 TABLET ONCE DAILY. (Patient taking differently: Take 1 tablet by mouth daily.) 30 tablet 6   methocarbamol (ROBAXIN) 500 MG tablet Take 1 tablet (500 mg total) by mouth 3 (three) times daily. 21 tablet 0   nitroGLYCERIN (NITROSTAT) 0.4 MG SL tablet Place 1 tablet (0.4 mg total) under the tongue every 5 (five) minutes x 3 doses as needed for chest pain. 25 tablet 0   ondansetron (ZOFRAN ODT) 4 MG disintegrating tablet Take 1 tablet (4 mg total) by mouth every 8 (eight) hours as needed. (Patient taking differently: Take 4 mg by mouth every 8 (eight) hours as needed for nausea or vomiting.) 10 tablet 1   No current facility-administered medications for this visit.   Allergies:  Suboxone [buprenorphine hcl-naloxone hcl], Benadryl [diphenhydramine], Seroquel [quetiapine], and Tylenol pm extra [diphenhydramine-apap (sleep)]    Social History: The patient  reports that he has been smoking cigarettes and cigars. He started smoking about 32 years ago. He has a 25.00 pack-year smoking history. He quit smokeless tobacco use about 32 years ago.  His smokeless tobacco use included snuff. He reports current drug use. Drug: Marijuana. He reports that he does not drink alcohol.   Family History: The patient's family history includes Heart attack (age of onset: 34) in his brother and father.   ROS:  Please see the history of present illness. Otherwise, complete review of systems is positive for none.  All other systems are reviewed and negative.   Physical Exam: VS:  BP 126/80   Pulse 71   Ht 5\' 6"  (1.676 m)   Wt 169 lb 12.8 oz (77 kg)   SpO2 95%   BMI 27.41 kg/m , BMI Body mass index is 27.41 kg/m.  Wt Readings from Last 3 Encounters:  08/20/20 169 lb 12.8 oz (77 kg)  07/23/20 170 lb (77.1 kg)  10/28/19 190 lb (86.2 kg)    General: Patient appears comfortable at rest.  Neck: Supple, no elevated JVP or carotid bruits, no thyromegaly. Lungs: Clear to auscultation, nonlabored breathing at rest. Cardiac: Regular rate and rhythm, no S3 or significant systolic murmur, no pericardial rub. Extremities: No pitting edema, distal pulses 2+. Skin: Warm and dry. Musculoskeletal: No kyphosis. Neuropsychiatric: Alert and oriented x3, affect grossly appropriate.  ECG:  EKG 07/23/2020 : Normal sinus rhythm rate of 100,RAE  Recent Labwork: 07/23/2020: ALT 28; AST 24; BUN 10; Creatinine, Ser 0.93; Hemoglobin 16.3; Platelets 231; Potassium 3.5; Sodium 139     Component Value Date/Time   CHOL 270 (H) 07/01/2018 0018   TRIG 163 (H) 07/01/2018 0018   HDL 27 (L) 07/01/2018 0018   CHOLHDL 10.0 07/01/2018 0018   VLDL 33 07/01/2018 0018   LDLCALC 210 (H) 07/01/2018 0018    Other Studies Reviewed Today:   Cardiac catheterization 07/02/2018  Widely patent left main Diffuse 20 to 30% LAD disease.  Change from 2018. Circumflex  stent previously placed in an overlapping fashion is widely patent.  Unchanged from 2018. Moderate diffuse disease proximal to distal RCA up to 50%.  Distal RCA after the origin of the PDA is totally occluded in a previously stented segment.  The PDA shows progression to a 99% thrombus filled stenosis at the site of the previously contained 75% narrowing. Successful PCI with overlapping stents in the distal RCA extending into the PDA and in the proximal to mid PDA reducing 99% stenosis to 0% with TIMI grade III flow postdilated to 2.25 mm in diameter at high pressure.  TIMI grade III restored.   RECOMMENDATIONS:   Aggressive risk factor modification. Smoking cessation. Dual antiplatelet therapy, initially aspirin and Brilinta, and can consider decreasing intensity by switching to Plavix and aspirin after 30 days. Diagnostic Dominance: Right    Intervention        Assessment and Plan:  1. Chest pain of uncertain etiology   2. CAD in native artery   3. Primary hypertension   4. Mixed hyperlipidemia   5. Chest pain, unspecified type    1. Chest pain of uncertain etiology Recent presentation to [facility], ED for chest pain, nausea, vomiting.  Troponins were negative x2.  He continues with intermittent chest pain on and off with associated nausea and some lightheadedness.  Please get a Lexiscan stress test.  2. CAD in native artery Previous cardiac catheterization 07/02/2018.  Widely patent LM, diffuse 20 to 30% LAD disease change from 2018.  Previously stented circumflex widely patent.  Moderate diffuse disease proximal to distal RCA up to 50%, distal RCA after origin of PDA totally occluded in a previously stented segment, PDA showed progression to 99% on most field stenosis at site of previously containing 75% narrowing.  Successful PCI with overlapping stents in the distal RCA extending into the PDA and proximal to mid PDA.  Continue aspirin 81 mg daily, continue nitroglycerin  sublingual as needed.  Continue carvedilol 6.25 mg p.o. twice daily.  3. Primary hypertension Blood pressure well controlled on current therapy.  Continue Zestoretic 20/12.5 mg p.o. daily.  Continue 6.25 mg p.o. twice daily.  4. Mixed hyperlipidemia Continue atorvastatin 80 mg p.o. daily.   Medication Adjustments/Labs and Tests Ordered: Current medicines are reviewed at length with the patient today.  Concerns regarding medicines are outlined above.   Disposition: Follow-up with Dr. Wyline Mood or APP 6 to 8 weeks.  Signed, Rennis Harding, NP 08/22/2020 10:48 PM    [facility] at [facility] [address] [place], [place], [place]  [place] Phone: [phone]; Fax: [phone]

## 2020-09-01 ENCOUNTER — Encounter (HOSPITAL_COMMUNITY): Payer: Medicaid Other

## 2020-09-30 ENCOUNTER — Telehealth: Payer: Self-pay | Admitting: Family Medicine

## 2020-09-30 NOTE — Telephone Encounter (Signed)
Checking percert on the following patient for testing scheduled at [facility].     LEXISCAN   10/19/2020

## 2020-09-30 NOTE — Progress Notes (Deleted)
Cardiology Office Note  Date: 09/30/2020   ID: JUANDIEGO Fletcher, DOB 1970/05/23, MRN [id number]  PCP:  [name], MD  Cardiologist:  Dina Rich, MD Electrophysiologist:  None   Chief Complaint: ED follow up [facility]  History of Present Illness: Don Fletcher is a 50 y.o. male with a history of CAD NSTEMI/STEMI, HLD, HTN, ischemic cardiomyopathy, tobacco abuse, PUD.  Patient presented to [facility] emergency department 07/23/2020 stating that 2 days prior in the morning he began  having some diarrhea, chest discomfort, and sweating.  Lasted approximately 30 minutes.  He vomited for approximately 3 hours.  He had multiple labs.  EKG showed normal sinus rhythm with right atrial enlargement rate of 100.  Chest x-ray demonstrated interstitial thickening likely representing a degree of chronic bronchitis.  No edema, or airspace opacity.  Cardiac silhouette was normal.  He received IV fluids.  ER provider spoke with Dr. Wyline Mood who felt patient could be followed up next week.  Diagnosis was atypical chest pain. Initial troponin was 4.  Subsequent troponin was 5.  Lipase was 31, LFTs were normal, respiratory panel was negative.  CBC unremarkable.  Basic metabolic panel unremarkable except for glucose of 119. He was discharged.  He presents today for follow up. He states he continues to have intermittent chest pain and with associated nausea. He mentioned that he has a significant history of substance abuse. He states in the past he has used nearly every possible recreational substance. He continues to smoke. He denies any SOB of DOE/ orthostatic symptoms other than some lightheadedness recently associated with the chest pain episodes.. No near syncope or syncope. No bleeding issues. Denies any dyspeptic symptoms or feeling of epigastric pain after eating. Denies and blood in stool or urine.     Lexiscan stress test has not been done it was ordered on 08/20/2020   Past Medical History:   Diagnosis Date   Bipolar 1 disorder (HCC)    CAD (coronary artery disease)    a. 12/16/13 inferolat STEMI s/p BMS to RPL. b. Cath 08/2014: interim occlusion of the PLA stent of the RCA, collateralized from the left coronary artery with diffuse nonobstructive CAD;  c. STEMI/PCI: LM nl, LAD nl, LCX 121m/d (2.75x12 synergy DES), RCA 30p, RPDA 75ost/p, RPAV 100 w/ L->R collats.   Chronic back pain    HLD (hyperlipidemia)    Hypertension    Ischemic cardiomyopathy    a. 12/2013: EF 50-55% by echo with +WMA. b. EF 45% by cath in 08/2014.   Ischemic cardiomyopathy 05/19/2016   Peptic ulcer disease    Schizophrenic disorder (HCC)    Tobacco abuse     Past Surgical History:  Procedure Laterality Date   CARDIAC CATHETERIZATION N/A 08/07/2014   Procedure: Left Heart Cath and Coronary Angiography;  Surgeon: Tonny Bollman, MD;  Location: [facility] INVASIVE CV LAB;  Service: Cardiovascular;  Laterality: N/A;   COLONOSCOPY N/A 06/06/2017   Procedure: COLONOSCOPY;  Surgeon: Malissa Hippo, MD;  [facility] ENDO [facility];  Service: Endoscopy;  Laterality: N/A;  10:50   CORONARY STENT INTERVENTION N/A 05/19/2016   Procedure: Coronary Stent Intervention;  Surgeon: Runell Gess, MD;  Location: [facility] INVASIVE CV LAB;  Service: Cardiovascular;  Laterality: N/A;   CORONARY STENT INTERVENTION N/A 07/02/2018   Procedure: CORONARY STENT INTERVENTION;  Surgeon: Lyn Records, MD;  Location: [facility] INVASIVE CV LAB;  Service: Cardiovascular;  Laterality: N/A;   HERNIA REPAIR     LEFT HEART CATH AND  CORONARY ANGIOGRAPHY N/A 05/19/2016   Procedure: Left Heart Cath and Coronary Angiography;  Surgeon: Runell Gess, MD;  Location: [facility] INVASIVE CV LAB;  Service: Cardiovascular;  Laterality: N/A;   LEFT HEART CATH AND CORONARY ANGIOGRAPHY N/A 07/02/2018   Procedure: LEFT HEART CATH AND CORONARY ANGIOGRAPHY;  Surgeon: Lyn Records, MD;  Location: [facility] INVASIVE CV LAB;  Service: Cardiovascular;  Laterality: N/A;   LEFT HEART  CATHETERIZATION WITH CORONARY ANGIOGRAM N/A 12/16/2013   Procedure: LEFT HEART CATHETERIZATION WITH CORONARY ANGIOGRAM;  Surgeon: Corky Crafts, MD;  Location: [facility] CATH LAB;  Service: Cardiovascular;  Laterality: N/A;   PERCUTANEOUS CORONARY STENT INTERVENTION (PCI-S)  12/16/2013   Procedure: PERCUTANEOUS CORONARY STENT INTERVENTION (PCI-S);  Surgeon: Corky Crafts, MD;  Location: [facility] CATH LAB;  Service: Cardiovascular;;  distal rca    Current Outpatient Medications  Medication Sig Dispense Refill   aspirin EC 81 MG tablet Take 81 mg by mouth daily.     atorvastatin (LIPITOR) 80 MG tablet TAKE 1 TABLET ONCE DAILY. (Patient taking differently: Take 80 mg by mouth daily.) 30 tablet 6   carvedilol (COREG) 6.25 MG tablet TAKE 1 TABLET BY MOUTH 2 TIMES A DAY WITH MEALS. (Patient taking differently: Take 6.25 mg by mouth 2 (two) times daily with a meal.) 30 tablet 0   famotidine (PEPCID) 20 MG tablet Take 1 tablet (20 mg total) by mouth 2 (two) times daily. 30 tablet 0   lidocaine (LIDODERM) 5 % Place 1 patch onto the skin daily. Remove & Discard patch within 12 hours or as directed by MD 30 patch 0   lisinopril-hydrochlorothiazide (ZESTORETIC) 20-12.5 MG tablet TAKE 1 TABLET ONCE DAILY. (Patient taking differently: Take 1 tablet by mouth daily.) 30 tablet 6   methocarbamol (ROBAXIN) 500 MG tablet Take 1 tablet (500 mg total) by mouth 3 (three) times daily. 21 tablet 0   nitroGLYCERIN (NITROSTAT) 0.4 MG SL tablet Place 1 tablet (0.4 mg total) under the tongue every 5 (five) minutes x 3 doses as needed for chest pain. 25 tablet 0   ondansetron (ZOFRAN ODT) 4 MG disintegrating tablet Take 1 tablet (4 mg total) by mouth every 8 (eight) hours as needed. (Patient taking differently: Take 4 mg by mouth every 8 (eight) hours as needed for nausea or vomiting.) 10 tablet 1   No current facility-administered medications for this visit.   Allergies:  Suboxone [buprenorphine hcl-naloxone hcl], Benadryl  [diphenhydramine], Seroquel [quetiapine], and Tylenol pm extra [diphenhydramine-apap (sleep)]   Social History: The patient  reports that he has been smoking cigarettes and cigars. He started smoking about 32 years ago. He has a 25.00 pack-year smoking history. He quit smokeless tobacco use about 32 years ago.  His smokeless tobacco use included snuff. He reports current drug use. Drug: Marijuana. He reports that he does not drink alcohol.   Family History: The patient's family history includes Heart attack (age of onset: 29) in his brother and father.   ROS:  Please see the history of present illness. Otherwise, complete review of systems is positive for none.  All other systems are reviewed and negative.   Physical Exam: VS:  There were no vitals taken for this visit., BMI There is no height or weight on file to calculate BMI.  Wt Readings from Last 3 Encounters:  08/20/20 169 lb 12.8 oz (77 kg)  07/23/20 170 lb (77.1 kg)  10/28/19 190 lb (86.2 kg)    General: Patient appears comfortable at rest. Neck: Supple, no elevated  JVP or carotid bruits, no thyromegaly. Lungs: Clear to auscultation, nonlabored breathing at rest. Cardiac: Regular rate and rhythm, no S3 or significant systolic murmur, no pericardial rub. Extremities: No pitting edema, distal pulses 2+. Skin: Warm and dry. Musculoskeletal: No kyphosis. Neuropsychiatric: Alert and oriented x3, affect grossly appropriate.  ECG:  EKG 07/23/2020 : Normal sinus rhythm rate of 100,RAE  Recent Labwork: 07/23/2020: ALT 28; AST 24; BUN 10; Creatinine, Ser 0.93; Hemoglobin 16.3; Platelets 231; Potassium 3.5; Sodium 139     Component Value Date/Time   CHOL 270 (H) 07/01/2018 0018   TRIG 163 (H) 07/01/2018 0018   HDL 27 (L) 07/01/2018 0018   CHOLHDL 10.0 07/01/2018 0018   VLDL 33 07/01/2018 0018   LDLCALC 210 (H) 07/01/2018 0018    Other Studies Reviewed Today:   Cardiac catheterization 07/02/2018  Widely patent left main Diffuse  20 to 30% LAD disease.  Change from 2018. Circumflex stent previously placed in an overlapping fashion is widely patent.  Unchanged from 2018. Moderate diffuse disease proximal to distal RCA up to 50%.  Distal RCA after the origin of the PDA is totally occluded in a previously stented segment.  The PDA shows progression to a 99% thrombus filled stenosis at the site of the previously contained 75% narrowing. Successful PCI with overlapping stents in the distal RCA extending into the PDA and in the proximal to mid PDA reducing 99% stenosis to 0% with TIMI grade III flow postdilated to 2.25 mm in diameter at high pressure.  TIMI grade III restored.   RECOMMENDATIONS:   Aggressive risk factor modification. Smoking cessation. Dual antiplatelet therapy, initially aspirin and Brilinta, and can consider decreasing intensity by switching to Plavix and aspirin after 30 days. Diagnostic Dominance: Right    Intervention        Assessment and Plan:  1. Chest pain, unspecified type   2. CAD in native artery   3. Primary hypertension   4. Mixed hyperlipidemia     1. Chest pain of uncertain etiology Recent presentation to [facility], ED for chest pain, nausea, vomiting.  Troponins were negative x2.  He continues with intermittent chest pain on and off with associated nausea and some lightheadedness.  Please get a Lexiscan stress test.  2. CAD in native artery Previous cardiac catheterization 07/02/2018.  Widely patent LM, diffuse 20 to 30% LAD disease change from 2018.  Previously stented circumflex widely patent.  Moderate diffuse disease proximal to distal RCA up to 50%, distal RCA after origin of PDA totally occluded in a previously stented segment, PDA showed progression to 99% on most field stenosis at site of previously containing 75% narrowing.  Successful PCI with overlapping stents in the distal RCA extending into the PDA and proximal to mid PDA.  Continue aspirin 81 mg daily, continue  nitroglycerin sublingual as needed.  Continue carvedilol 6.25 mg p.o. twice daily.  3. Primary hypertension Blood pressure well controlled on current therapy.  Continue Zestoretic 20/12.5 mg p.o. daily.  Continue 6.25 mg p.o. twice daily.  4. Mixed hyperlipidemia Continue atorvastatin 80 mg p.o. daily.   Medication Adjustments/Labs and Tests Ordered: Current medicines are reviewed at length with the patient today.  Concerns regarding medicines are outlined above.   Disposition: Follow-up with Dr. Wyline Mood or APP 6 to 8 weeks.  Signed, Rennis Harding, NP 09/30/2020 4:30 PM    [facility] at [facility] [address] [place], [place], [place] [address] Phone: [phone]; Fax: [phone]

## 2020-10-01 ENCOUNTER — Ambulatory Visit: Payer: Medicaid Other | Admitting: Family Medicine

## 2020-10-19 ENCOUNTER — Encounter (HOSPITAL_COMMUNITY)
Admission: RE | Admit: 2020-10-19 | Discharge: 2020-10-19 | Disposition: A | Discharge status: Home / Self Care | Payer: Medicaid Other | Source: Ambulatory Visit | Attending: Family Medicine | Admitting: Family Medicine

## 2020-10-19 ENCOUNTER — Other Ambulatory Visit: Payer: Self-pay

## 2020-10-19 ENCOUNTER — Encounter (HOSPITAL_COMMUNITY): Payer: Self-pay

## 2020-10-19 ENCOUNTER — Ambulatory Visit (HOSPITAL_COMMUNITY)
Admission: RE | Admit: 2020-10-19 | Discharge: 2020-10-19 | Disposition: A | Discharge status: Home / Self Care | Payer: Medicaid Other | Source: Ambulatory Visit | Attending: Family Medicine | Admitting: Family Medicine

## 2020-10-19 DIAGNOSIS — R079 Chest pain, unspecified: Secondary | ICD-10-CM | POA: Insufficient documentation

## 2020-10-19 LAB — NM MYOCAR MULTI W/SPECT W/WALL MOTION / EF
LV dias vol: 118 mL (ref 62–150)
LV sys vol: 62 mL
Nuc Stress EF: 48 %
Peak HR: 113 {beats}/min
RATE: 0.4
Rest HR: 69 {beats}/min
Rest Nuclear Isotope Dose: 10.8 mCi
SDS: 4
SRS: 6
SSS: 10
ST Depression (mm): 0 mm
Stress Nuclear Isotope Dose: 31.7 mCi
TID: 1.19

## 2020-10-19 MED ORDER — REGADENOSON 0.4 MG/5ML IV SOLN
INTRAVENOUS | Status: AC
  Administered 2020-10-19: 0.4 mg via INTRAVENOUS
  Filled 2020-10-19: qty 5

## 2020-10-19 MED ORDER — SODIUM CHLORIDE FLUSH 0.9 % IV SOLN
INTRAVENOUS | Status: AC
  Administered 2020-10-19: 10 mL via INTRAVENOUS
  Filled 2020-10-19: qty 10

## 2020-10-19 MED ORDER — TECHNETIUM TC 99M TETROFOSMIN IV KIT
30.0000 | PACK | Freq: Once | INTRAVENOUS | Status: AC | PRN
  Administered 2020-10-19: 31.7 via INTRAVENOUS

## 2020-10-19 MED ORDER — TECHNETIUM TC 99M TETROFOSMIN IV KIT
10.0000 | PACK | Freq: Once | INTRAVENOUS | Status: AC | PRN
  Administered 2020-10-19: 10.8 via INTRAVENOUS

## 2020-10-20 ENCOUNTER — Telehealth: Payer: Self-pay | Admitting: *Deleted

## 2020-10-20 NOTE — Telephone Encounter (Signed)
-----   Message from Netta Neat., NP sent at 10/19/2020  3:35 PM EDT ----- Please call the patient and let him know the stress test did not show any new evidence of lack of blood flow.  There was evidence of prior infarction.  The conclusion was moderate sized inferior wall infarct apex to base with no ischemia.  This is evidence of the prior heart attack.  No new lack of blood flow per the interpreting physicians conclusion.  Thank you  Netta Neat, NP  10/19/2020 3:33 PM

## 2020-10-20 NOTE — Telephone Encounter (Signed)
Lesle Chris, LPN  3/40/3709  4:09 PM EDT Back to Top    Notified, copy to pcp.

## 2020-10-20 NOTE — Progress Notes (Deleted)
Cardiology Office Note  Date: 10/20/2020   ID: Don Fletcher, DOB February 12, 1970, MRN [id number]  PCP:  [name], MD  Cardiologist:  Dina Rich, MD Electrophysiologist:  None   Chief Complaint: ED follow up [facility]  History of Present Illness: Don Fletcher is a 50 y.o. male with a history of CAD NSTEMI/STEMI, HLD, HTN, ischemic cardiomyopathy, tobacco abuse, PUD.  Patient presented to [facility] emergency department 07/23/2020 stating that 2 days prior in the morning he began  having some diarrhea, chest discomfort, and sweating.  Lasted approximately 30 minutes.  He vomited for approximately 3 hours.  He had multiple labs.  EKG showed normal sinus rhythm with right atrial enlargement rate of 100.  Chest x-ray demonstrated interstitial thickening likely representing a degree of chronic bronchitis.  No edema, or airspace opacity.  Cardiac silhouette was normal.  He received IV fluids.  ER provider spoke with Dr. Wyline Mood who felt patient could be followed up next week.  Diagnosis was atypical chest pain. Initial troponin was 4.  Subsequent troponin was 5.  Lipase was 31, LFTs were normal, respiratory panel was negative.  CBC unremarkable.  Basic metabolic panel unremarkable except for glucose of 119. He was discharged.  He presents today for follow up. He states he continues to have intermittent chest pain and with associated nausea. He mentioned that he has a significant history of substance abuse. He states in the past he has used nearly every possible recreational substance. He continues to smoke. He denies any SOB of DOE/ orthostatic symptoms other than some lightheadedness recently associated with the chest pain episodes.. No near syncope or syncope. No bleeding issues. Denies any dyspeptic symptoms or feeling of epigastric pain after eating. Denies and blood in stool or urine.     Lexiscan stress test was done      Past Medical History:  Diagnosis Date   Bipolar 1 disorder  (HCC)    CAD (coronary artery disease)    a. 12/16/13 inferolat STEMI s/p BMS to RPL. b. Cath 08/2014: interim occlusion of the PLA stent of the RCA, collateralized from the left coronary artery with diffuse nonobstructive CAD;  c. STEMI/PCI: LM nl, LAD nl, LCX 144m/d (2.75x12 synergy DES), RCA 30p, RPDA 75ost/p, RPAV 100 w/ L->R collats.   Chronic back pain    HLD (hyperlipidemia)    Hypertension    Ischemic cardiomyopathy    a. 12/2013: EF 50-55% by echo with +WMA. b. EF 45% by cath in 08/2014.   Ischemic cardiomyopathy 05/19/2016   Peptic ulcer disease    Schizophrenic disorder (HCC)    Tobacco abuse     Past Surgical History:  Procedure Laterality Date   CARDIAC CATHETERIZATION N/A 08/07/2014   Procedure: Left Heart Cath and Coronary Angiography;  Surgeon: Tonny Bollman, MD;  Location: [facility] INVASIVE CV LAB;  Service: Cardiovascular;  Laterality: N/A;   COLONOSCOPY N/A 06/06/2017   Procedure: COLONOSCOPY;  Surgeon: Malissa Hippo, MD;  [facility] ENDO [facility];  Service: Endoscopy;  Laterality: N/A;  10:50   CORONARY STENT INTERVENTION N/A 05/19/2016   Procedure: Coronary Stent Intervention;  Surgeon: Runell Gess, MD;  Location: [facility] INVASIVE CV LAB;  Service: Cardiovascular;  Laterality: N/A;   CORONARY STENT INTERVENTION N/A 07/02/2018   Procedure: CORONARY STENT INTERVENTION;  Surgeon: Lyn Records, MD;  Location: [facility] INVASIVE CV LAB;  Service: Cardiovascular;  Laterality: N/A;   HERNIA REPAIR     LEFT HEART CATH AND CORONARY ANGIOGRAPHY N/A 05/19/2016  Procedure: Left Heart Cath and Coronary Angiography;  Surgeon: Runell Gess, MD;  Location: [facility] INVASIVE CV LAB;  Service: Cardiovascular;  Laterality: N/A;   LEFT HEART CATH AND CORONARY ANGIOGRAPHY N/A 07/02/2018   Procedure: LEFT HEART CATH AND CORONARY ANGIOGRAPHY;  Surgeon: Lyn Records, MD;  Location: [facility] INVASIVE CV LAB;  Service: Cardiovascular;  Laterality: N/A;   LEFT HEART CATHETERIZATION WITH CORONARY ANGIOGRAM N/A  12/16/2013   Procedure: LEFT HEART CATHETERIZATION WITH CORONARY ANGIOGRAM;  Surgeon: Corky Crafts, MD;  Location: [facility] CATH LAB;  Service: Cardiovascular;  Laterality: N/A;   PERCUTANEOUS CORONARY STENT INTERVENTION (PCI-S)  12/16/2013   Procedure: PERCUTANEOUS CORONARY STENT INTERVENTION (PCI-S);  Surgeon: Corky Crafts, MD;  Location: [facility] CATH LAB;  Service: Cardiovascular;;  distal rca    Current Outpatient Medications  Medication Sig Dispense Refill   aspirin EC 81 MG tablet Take 81 mg by mouth daily.     atorvastatin (LIPITOR) 80 MG tablet TAKE 1 TABLET ONCE DAILY. (Patient taking differently: Take 80 mg by mouth daily.) 30 tablet 6   carvedilol (COREG) 6.25 MG tablet TAKE 1 TABLET BY MOUTH 2 TIMES A DAY WITH MEALS. (Patient taking differently: Take 6.25 mg by mouth 2 (two) times daily with a meal.) 30 tablet 0   famotidine (PEPCID) 20 MG tablet Take 1 tablet (20 mg total) by mouth 2 (two) times daily. 30 tablet 0   lidocaine (LIDODERM) 5 % Place 1 patch onto the skin daily. Remove & Discard patch within 12 hours or as directed by MD 30 patch 0   lisinopril-hydrochlorothiazide (ZESTORETIC) 20-12.5 MG tablet TAKE 1 TABLET ONCE DAILY. (Patient taking differently: Take 1 tablet by mouth daily.) 30 tablet 6   methocarbamol (ROBAXIN) 500 MG tablet Take 1 tablet (500 mg total) by mouth 3 (three) times daily. 21 tablet 0   nitroGLYCERIN (NITROSTAT) 0.4 MG SL tablet Place 1 tablet (0.4 mg total) under the tongue every 5 (five) minutes x 3 doses as needed for chest pain. 25 tablet 0   ondansetron (ZOFRAN ODT) 4 MG disintegrating tablet Take 1 tablet (4 mg total) by mouth every 8 (eight) hours as needed. (Patient taking differently: Take 4 mg by mouth every 8 (eight) hours as needed for nausea or vomiting.) 10 tablet 1   No current facility-administered medications for this visit.   Allergies:  Suboxone [buprenorphine hcl-naloxone hcl], Benadryl [diphenhydramine], Seroquel [quetiapine], and  Tylenol pm extra [diphenhydramine-apap (sleep)]   Social History: The patient  reports that he has been smoking cigarettes and cigars. He started smoking about 32 years ago. He has a 25.00 pack-year smoking history. He quit smokeless tobacco use about 32 years ago.  His smokeless tobacco use included snuff. He reports current drug use. Drug: Marijuana. He reports that he does not drink alcohol.   Family History: The patient's family history includes Heart attack (age of onset: 59) in his brother and father.   ROS:  Please see the history of present illness. Otherwise, complete review of systems is positive for none.  All other systems are reviewed and negative.   Physical Exam: VS:  There were no vitals taken for this visit., BMI There is no height or weight on file to calculate BMI.  Wt Readings from Last 3 Encounters:  08/20/20 169 lb 12.8 oz (77 kg)  07/23/20 170 lb (77.1 kg)  10/28/19 190 lb (86.2 kg)    General: Patient appears comfortable at rest. Neck: Supple, no elevated JVP or carotid bruits, no thyromegaly.  Lungs: Clear to auscultation, nonlabored breathing at rest. Cardiac: Regular rate and rhythm, no S3 or significant systolic murmur, no pericardial rub. Extremities: No pitting edema, distal pulses 2+. Skin: Warm and dry. Musculoskeletal: No kyphosis. Neuropsychiatric: Alert and oriented x3, affect grossly appropriate.  ECG:  EKG 07/23/2020 : Normal sinus rhythm rate of 100,RAE  Recent Labwork: 07/23/2020: ALT 28; AST 24; BUN 10; Creatinine, Ser 0.93; Hemoglobin 16.3; Platelets 231; Potassium 3.5; Sodium 139     Component Value Date/Time   CHOL 270 (H) 07/01/2018 0018   TRIG 163 (H) 07/01/2018 0018   HDL 27 (L) 07/01/2018 0018   CHOLHDL 10.0 07/01/2018 0018   VLDL 33 07/01/2018 0018   LDLCALC 210 (H) 07/01/2018 0018    Other Studies Reviewed Today:  Nuclear stress test 10/20/2020   Findings are consistent with prior myocardial infarction. The study is low risk.    No ST deviation was noted.   LV perfusion is abnormal. There is no evidence of ischemia. There is evidence of infarction. Defect 1: There is a medium defect with severe reduction in uptake present in the mid inferior location(s) that is fixed. Viability is absent. There is abnormal wall motion in the defect area. Inferior wall hypokinesis Consistent with infarction and artifact.   Left ventricular function is abnormal. Global function is mildly reduced. End diastolic cavity size is mildly enlarged.   Moderate sized inferior wall infarct apex to base with no ischemia EF estimated at 48%      Cardiac catheterization 07/02/2018  Widely patent left main Diffuse 20 to 30% LAD disease.  Change from 2018. Circumflex stent previously placed in an overlapping fashion is widely patent.  Unchanged from 2018. Moderate diffuse disease proximal to distal RCA up to 50%.  Distal RCA after the origin of the PDA is totally occluded in a previously stented segment.  The PDA shows progression to a 99% thrombus filled stenosis at the site of the previously contained 75% narrowing. Successful PCI with overlapping stents in the distal RCA extending into the PDA and in the proximal to mid PDA reducing 99% stenosis to 0% with TIMI grade III flow postdilated to 2.25 mm in diameter at high pressure.  TIMI grade III restored.   RECOMMENDATIONS:   Aggressive risk factor modification. Smoking cessation. Dual antiplatelet therapy, initially aspirin and Brilinta, and can consider decreasing intensity by switching to Plavix and aspirin after 30 days. Diagnostic Dominance: Right    Intervention     Echocardiogram 07/19/2017 Study Conclusions   - Left ventricle: The cavity size was normal. Wall thickness was    normal. The estimated ejection fraction was 55%. There is    hypokinesis of the basal-midinferolateral and inferior    myocardium. Left ventricular diastolic function parameters were    normal for the  patient&'s age.  - Aortic valve: Mildly calcified annulus. Trileaflet.  - Mitral valve: There was trivial regurgitation.  - Right atrium: Central venous pressure (est): 3 mm Hg.  - Atrial septum: No defect or patent foramen ovale was identified.  - Tricuspid valve: There was trivial regurgitation.  - Pulmonary arteries: PA peak pressure: 14 mm Hg (S).  - Pericardium, extracardiac: There was no pericardial effusion.   ABI 07/19/2017 Final Interpretation:  Right: Resting right ankle-brachial index is within normal range.  No evidence of significant right lower extremity arterial disease.  The right toe-brachial index is normal. PPG tracings appear dampened.   Left: Resting left ankle-brachial index is within normal range.  No evidence of  significant left lower extremity arterial disease.  The left toe-brachial index is normal. PPG tracings appear dampened.    Assessment and Plan:  1. Chest pain of uncertain etiology   2. CAD in native artery   3. Primary hypertension   4. Mixed hyperlipidemia      1. Chest pain of uncertain etiology Recent presentation to [facility] ED for chest pain, nausea, vomiting.  Troponins were negative x2.  He continues with intermittent chest pain on and off with associated nausea and some lightheadedness.  Please get a Lexiscan stress test.  2. CAD in native artery Previous cardiac catheterization 07/02/2018.  Widely patent LM, diffuse 20 to 30% LAD disease change from 2018.  Previously stented circumflex widely patent.  Moderate diffuse disease proximal to distal RCA up to 50%, distal RCA after origin of PDA totally occluded in a previously stented segment, PDA showed progression to 99% on most field stenosis at site of previously containing 75% narrowing.  Successful PCI with overlapping stents in the distal RCA extending into the PDA and proximal to mid PDA.  Continue aspirin 81 mg daily, continue nitroglycerin sublingual as needed.  Continue carvedilol  6.25 mg p.o. twice daily.  3. Primary hypertension Blood pressure well controlled on current therapy.  Continue Zestoretic 20/12.5 mg p.o. daily.  Continue 6.25 mg p.o. twice daily.  4. Mixed hyperlipidemia Continue atorvastatin 80 mg p.o. daily.   Medication Adjustments/Labs and Tests Ordered: Current medicines are reviewed at length with the patient today.  Concerns regarding medicines are outlined above.   Disposition: Follow-up with Dr. Wyline Mood or APP 6 to 8 weeks.  Signed, Rennis Harding, NP 10/20/2020 11:51 PM    [facility] at [facility] [address] [place], [place], [place] [address] Phone: [phone]; Fax: [phone]

## 2020-10-21 ENCOUNTER — Ambulatory Visit: Payer: Medicaid Other | Admitting: Family Medicine

## 2020-10-21 DIAGNOSIS — I2510 Atherosclerotic heart disease of native coronary artery without angina pectoris: Secondary | ICD-10-CM

## 2020-10-21 DIAGNOSIS — I10 Essential (primary) hypertension: Secondary | ICD-10-CM

## 2020-10-21 DIAGNOSIS — E782 Mixed hyperlipidemia: Secondary | ICD-10-CM

## 2020-10-21 DIAGNOSIS — R079 Chest pain, unspecified: Secondary | ICD-10-CM

## 2020-10-28 NOTE — Progress Notes (Deleted)
Cardiology Office Note  Date: 10/28/2020   ID: Don Fletcher, DOB Jul 16, 1970, MRN [id number]  PCP:  [name], MD  Cardiologist:  Dina Rich, MD Electrophysiologist:  None   Chief Complaint: ED follow up [facility]  History of Present Illness: Don Fletcher is a 50 y.o. male with a history of CAD NSTEMI/STEMI, HLD, HTN, ischemic cardiomyopathy, tobacco abuse, PUD.  Patient presented to [facility] emergency department 07/23/2020 stating that 2 days prior in the morning he began  having some diarrhea, chest discomfort, and sweating.  Lasted approximately 30 minutes.  He vomited for approximately 3 hours.  He had multiple labs.  EKG showed normal sinus rhythm with right atrial enlargement rate of 100.  Chest x-ray demonstrated interstitial thickening likely representing a degree of chronic bronchitis.  No edema, or airspace opacity.  Cardiac silhouette was normal.  He received IV fluids.  ER provider spoke with Dr. Wyline Mood who felt patient could be followed up next week.  Diagnosis was atypical chest pain. Initial troponin was 4.  Subsequent troponin was 5.  Lipase was 31, LFTs were normal, respiratory panel was negative.  CBC unremarkable.  Basic metabolic panel unremarkable except for glucose of 119. He was discharged.  He presents today for follow up. He states he continues to have intermittent chest pain and with associated nausea. He mentioned that he has a significant history of substance abuse. He states in the past he has used nearly every possible recreational substance. He continues to smoke. He denies any SOB of DOE/ orthostatic symptoms other than some lightheadedness recently associated with the chest pain episodes.. No near syncope or syncope. No bleeding issues. Denies any dyspeptic symptoms or feeling of epigastric pain after eating. Denies and blood in stool or urine.     Lexiscan stress test was done      Past Medical History:  Diagnosis Date   Bipolar 1 disorder  (HCC)    CAD (coronary artery disease)    a. 12/16/13 inferolat STEMI s/p BMS to RPL. b. Cath 08/2014: interim occlusion of the PLA stent of the RCA, collateralized from the left coronary artery with diffuse nonobstructive CAD;  c. STEMI/PCI: LM nl, LAD nl, LCX 143m/d (2.75x12 synergy DES), RCA 30p, RPDA 75ost/p, RPAV 100 w/ L->R collats.   Chronic back pain    HLD (hyperlipidemia)    Hypertension    Ischemic cardiomyopathy    a. 12/2013: EF 50-55% by echo with +WMA. b. EF 45% by cath in 08/2014.   Ischemic cardiomyopathy 05/19/2016   Peptic ulcer disease    Schizophrenic disorder (HCC)    Tobacco abuse     Past Surgical History:  Procedure Laterality Date   CARDIAC CATHETERIZATION N/A 08/07/2014   Procedure: Left Heart Cath and Coronary Angiography;  Surgeon: Tonny Bollman, MD;  Location: [facility] INVASIVE CV LAB;  Service: Cardiovascular;  Laterality: N/A;   COLONOSCOPY N/A 06/06/2017   Procedure: COLONOSCOPY;  Surgeon: Malissa Hippo, MD;  Location: [facility] ENDO SUITE;  Service: Endoscopy;  Laterality: N/A;  10:50   CORONARY STENT INTERVENTION N/A 05/19/2016   Procedure: Coronary Stent Intervention;  Surgeon: Runell Gess, MD;  Location: [facility] INVASIVE CV LAB;  Service: Cardiovascular;  Laterality: N/A;   CORONARY STENT INTERVENTION N/A 07/02/2018   Procedure: CORONARY STENT INTERVENTION;  Surgeon: Lyn Records, MD;  Location: [facility] INVASIVE CV LAB;  Service: Cardiovascular;  Laterality: N/A;   HERNIA REPAIR     LEFT HEART CATH AND CORONARY ANGIOGRAPHY N/A 05/19/2016  Procedure: Left Heart Cath and Coronary Angiography;  Surgeon: Runell Gess, MD;  Location: [facility] INVASIVE CV LAB;  Service: Cardiovascular;  Laterality: N/A;   LEFT HEART CATH AND CORONARY ANGIOGRAPHY N/A 07/02/2018   Procedure: LEFT HEART CATH AND CORONARY ANGIOGRAPHY;  Surgeon: Lyn Records, MD;  Location: [facility] INVASIVE CV LAB;  Service: Cardiovascular;  Laterality: N/A;   LEFT HEART CATHETERIZATION WITH CORONARY ANGIOGRAM N/A  12/16/2013   Procedure: LEFT HEART CATHETERIZATION WITH CORONARY ANGIOGRAM;  Surgeon: Corky Crafts, MD;  Location: [facility] CATH LAB;  Service: Cardiovascular;  Laterality: N/A;   PERCUTANEOUS CORONARY STENT INTERVENTION (PCI-S)  12/16/2013   Procedure: PERCUTANEOUS CORONARY STENT INTERVENTION (PCI-S);  Surgeon: Corky Crafts, MD;  Location: [facility] CATH LAB;  Service: Cardiovascular;;  distal rca    Current Outpatient Medications  Medication Sig Dispense Refill   aspirin EC 81 MG tablet Take 81 mg by mouth daily.     atorvastatin (LIPITOR) 80 MG tablet TAKE 1 TABLET ONCE DAILY. (Patient taking differently: Take 80 mg by mouth daily.) 30 tablet 6   carvedilol (COREG) 6.25 MG tablet TAKE 1 TABLET BY MOUTH 2 TIMES A DAY WITH MEALS. (Patient taking differently: Take 6.25 mg by mouth 2 (two) times daily with a meal.) 30 tablet 0   famotidine (PEPCID) 20 MG tablet Take 1 tablet (20 mg total) by mouth 2 (two) times daily. 30 tablet 0   lidocaine (LIDODERM) 5 % Place 1 patch onto the skin daily. Remove & Discard patch within 12 hours or as directed by MD 30 patch 0   lisinopril-hydrochlorothiazide (ZESTORETIC) 20-12.5 MG tablet TAKE 1 TABLET ONCE DAILY. (Patient taking differently: Take 1 tablet by mouth daily.) 30 tablet 6   methocarbamol (ROBAXIN) 500 MG tablet Take 1 tablet (500 mg total) by mouth 3 (three) times daily. 21 tablet 0   nitroGLYCERIN (NITROSTAT) 0.4 MG SL tablet Place 1 tablet (0.4 mg total) under the tongue every 5 (five) minutes x 3 doses as needed for chest pain. 25 tablet 0   ondansetron (ZOFRAN ODT) 4 MG disintegrating tablet Take 1 tablet (4 mg total) by mouth every 8 (eight) hours as needed. (Patient taking differently: Take 4 mg by mouth every 8 (eight) hours as needed for nausea or vomiting.) 10 tablet 1   No current facility-administered medications for this visit.   Allergies:  Suboxone [buprenorphine hcl-naloxone hcl], Benadryl [diphenhydramine], Seroquel [quetiapine], and  Tylenol pm extra [diphenhydramine-apap (sleep)]   Social History: The patient  reports that he has been smoking cigarettes and cigars. He started smoking about 32 years ago. He has a 25.00 pack-year smoking history. He quit smokeless tobacco use about 32 years ago.  His smokeless tobacco use included snuff. He reports current drug use. Drug: Marijuana. He reports that he does not drink alcohol.   Family History: The patient's family history includes Heart attack (age of onset: 38) in his brother and father.   ROS:  Please see the history of present illness. Otherwise, complete review of systems is positive for none.  All other systems are reviewed and negative.   Physical Exam: VS:  There were no vitals taken for this visit., BMI There is no height or weight on file to calculate BMI.  Wt Readings from Last 3 Encounters:  08/20/20 169 lb 12.8 oz (77 kg)  07/23/20 170 lb (77.1 kg)  10/28/19 190 lb (86.2 kg)    General: Patient appears comfortable at rest. Neck: Supple, no elevated JVP or carotid bruits, no thyromegaly.  Lungs: Clear to auscultation, nonlabored breathing at rest. Cardiac: Regular rate and rhythm, no S3 or significant systolic murmur, no pericardial rub. Extremities: No pitting edema, distal pulses 2+. Skin: Warm and dry. Musculoskeletal: No kyphosis. Neuropsychiatric: Alert and oriented x3, affect grossly appropriate.  ECG:  EKG 07/23/2020 : Normal sinus rhythm rate of 100,RAE  Recent Labwork: 07/23/2020: ALT 28; AST 24; BUN 10; Creatinine, Ser 0.93; Hemoglobin 16.3; Platelets 231; Potassium 3.5; Sodium 139     Component Value Date/Time   CHOL 270 (H) 07/01/2018 0018   TRIG 163 (H) 07/01/2018 0018   HDL 27 (L) 07/01/2018 0018   CHOLHDL 10.0 07/01/2018 0018   VLDL 33 07/01/2018 0018   LDLCALC 210 (H) 07/01/2018 0018    Other Studies Reviewed Today:  Nuclear stress test 10/19/2020   Findings are consistent with prior myocardial infarction. The study is low risk.    No ST deviation was noted.   LV perfusion is abnormal. There is no evidence of ischemia. There is evidence of infarction. Defect 1: There is a medium defect with severe reduction in uptake present in the mid inferior location(s) that is fixed. Viability is absent. There is abnormal wall motion in the defect area. Inferior wall hypokinesis Consistent with infarction and artifact.   Left ventricular function is abnormal. Global function is mildly reduced. End diastolic cavity size is mildly enlarged.   Moderate sized inferior wall infarct apex to base with no ischemia EF estimated at 48%      Cardiac catheterization 07/02/2018  Widely patent left main Diffuse 20 to 30% LAD disease.  Change from 2018. Circumflex stent previously placed in an overlapping fashion is widely patent.  Unchanged from 2018. Moderate diffuse disease proximal to distal RCA up to 50%.  Distal RCA after the origin of the PDA is totally occluded in a previously stented segment.  The PDA shows progression to a 99% thrombus filled stenosis at the site of the previously contained 75% narrowing. Successful PCI with overlapping stents in the distal RCA extending into the PDA and in the proximal to mid PDA reducing 99% stenosis to 0% with TIMI grade III flow postdilated to 2.25 mm in diameter at high pressure.  TIMI grade III restored.   RECOMMENDATIONS:   Aggressive risk factor modification. Smoking cessation. Dual antiplatelet therapy, initially aspirin and Brilinta, and can consider decreasing intensity by switching to Plavix and aspirin after 30 days. Diagnostic Dominance: Right    Intervention     Echocardiogram 07/19/2017 Study Conclusions   - Left ventricle: The cavity size was normal. Wall thickness was    normal. The estimated ejection fraction was 55%. There is    hypokinesis of the basal-midinferolateral and inferior    myocardium. Left ventricular diastolic function parameters were    normal for the  patient&'s age.  - Aortic valve: Mildly calcified annulus. Trileaflet.  - Mitral valve: There was trivial regurgitation.  - Right atrium: Central venous pressure (est): 3 mm Hg.  - Atrial septum: No defect or patent foramen ovale was identified.  - Tricuspid valve: There was trivial regurgitation.  - Pulmonary arteries: PA peak pressure: 14 mm Hg (S).  - Pericardium, extracardiac: There was no pericardial effusion.   ABI 07/19/2017 Final Interpretation:  Right: Resting right ankle-brachial index is within normal range.  No evidence of significant right lower extremity arterial disease.  The right toe-brachial index is normal. PPG tracings appear dampened.   Left: Resting left ankle-brachial index is within normal range.  No evidence of  significant left lower extremity arterial disease.  The left toe-brachial index is normal. PPG tracings appear dampened.    Assessment and Plan:  1. Chest pain of uncertain etiology   2. CAD in native artery   3. Primary hypertension   4. Mixed hyperlipidemia       1. Chest pain of uncertain etiology Recent presentation to [facility], ED for chest pain, nausea, vomiting.  Troponins were negative x2.  He continues with intermittent chest pain on and off with associated nausea and some lightheadedness.  Please get a Lexiscan stress test.  2. CAD in native artery Previous cardiac catheterization 07/02/2018.  Widely patent LM, diffuse 20 to 30% LAD disease change from 2018.  Previously stented circumflex widely patent.  Moderate diffuse disease proximal to distal RCA up to 50%, distal RCA after origin of PDA totally occluded in a previously stented segment, PDA showed progression to 99% on most field stenosis at site of previously containing 75% narrowing.  Successful PCI with overlapping stents in the distal RCA extending into the PDA and proximal to mid PDA.  Continue aspirin 81 mg daily, continue nitroglycerin sublingual as needed.  Continue carvedilol  6.25 mg p.o. twice daily.  3. Primary hypertension Blood pressure well controlled on current therapy.  Continue Zestoretic 20/12.5 mg p.o. daily.  Continue 6.25 mg p.o. twice daily.  4. Mixed hyperlipidemia Continue atorvastatin 80 mg p.o. daily.   Medication Adjustments/Labs and Tests Ordered: Current medicines are reviewed at length with the patient today.  Concerns regarding medicines are outlined above.   Disposition: Follow-up with Dr. Wyline Mood or APP 6 to 8 weeks.  Signed, Rennis Harding, NP 10/28/2020 11:11 PM    [facility] [address] [place], [place], [place] [address] Phone: [phone]; Fax: [phone]

## 2020-10-29 ENCOUNTER — Ambulatory Visit: Payer: Medicaid Other | Admitting: Family Medicine

## 2020-10-29 DIAGNOSIS — E782 Mixed hyperlipidemia: Secondary | ICD-10-CM

## 2020-10-29 DIAGNOSIS — I2510 Atherosclerotic heart disease of native coronary artery without angina pectoris: Secondary | ICD-10-CM

## 2020-10-29 DIAGNOSIS — R079 Chest pain, unspecified: Secondary | ICD-10-CM

## 2020-10-29 DIAGNOSIS — I10 Essential (primary) hypertension: Secondary | ICD-10-CM

## 2021-07-21 ENCOUNTER — Encounter: Payer: Self-pay | Admitting: Orthopaedic Surgery

## 2021-07-21 ENCOUNTER — Ambulatory Visit (INDEPENDENT_AMBULATORY_CARE_PROVIDER_SITE_OTHER): Payer: Medicaid Other | Admitting: Orthopaedic Surgery

## 2021-07-21 DIAGNOSIS — M1611 Unilateral primary osteoarthritis, right hip: Secondary | ICD-10-CM | POA: Insufficient documentation

## 2021-07-21 NOTE — Progress Notes (Signed)
Office Visit Note   Patient: Don Fletcher           Date of Birth: 1970/04/24           MRN: [id number] Visit Date: 07/21/2021              Requested by: [name], MD [address] [place],  [place] [address] PCP: [name], MD   Assessment & Plan: Visit Diagnoses:  1. Unilateral primary osteoarthritis, right hip     Plan: Patient will go get his meloxicam which was sent and but he has not taken any.  I can check him back again in 4 weeks.  He does have hip osteoarthritis but still has some joint space.  May consider intra-articular cortisone injection if anti-inflammatories not effective.  Recheck 4 weeks.  Follow-Up Instructions: Return in about 4 weeks (around 08/18/2021).   Orders:  No orders of the defined types were placed in this encounter.  No orders of the defined types were placed in this encounter.     Procedures: No procedures performed   Clinical Data: No additional findings.   Subjective: Chief Complaint  Patient presents with   Right Hip - Pain    Calf pain, it gets tight and vein pops out    HPI 51 year old male's been disabled for many years is here with right hip pain.  Patient has seen Dr.Hasanaj and has had groin pain with ambulation.  He has meloxicam sent in at the end of May but apparently never picked it up.  He usually has some problems with depression heart disease hypertension acid reflux history of DVT.  He smokes half pack per day.  He has had work-up negative for DVT.  Pain is in his groin and radiates down toward his right knee.  With increased ambulation he states he limps.  X-rays of shown right hip osteoarthritis with marginal osteophytes joint space narrowing right hip more than left hip.  Review of Systems all the systems noncontributory to HPI.  Past history of STEMI.   Objective: Vital Signs: BP (!) 144/82   Pulse 84   Ht 5\' 9"  (1.753 m)   Wt 175 lb (79.4 kg)   BMI 25.84 kg/m   Physical Exam Constitutional:       Appearance: He is well-developed.  HENT:     Head: Normocephalic and atraumatic.     Right Ear: External ear normal.     Left Ear: External ear normal.  Eyes:     Pupils: Pupils are equal, round, and reactive to light.  Neck:     Thyroid: No thyromegaly.     Trachea: No tracheal deviation.  Cardiovascular:     Rate and Rhythm: Normal rate.  Pulmonary:     Effort: Pulmonary effort is normal.     Breath sounds: No wheezing.  Abdominal:     General: Bowel sounds are normal.     Palpations: Abdomen is soft.  Musculoskeletal:     Cervical back: Neck supple.  Skin:    General: Skin is warm and dry.     Capillary Refill: Capillary refill takes less than 2 seconds.  Neurological:     Mental Status: He is alert and oriented to person, place, and time.  Psychiatric:        Behavior: Behavior normal.        Thought Content: Thought content normal.        Judgment: Judgment normal.     Ortho Exam  patient is sharp right groin pain with internal rotation limited only 10 degrees right hip external rotation 30 degrees with pain.  Amatory with positive Trendelenburg gait on the right distal pulses intact no knee effusion.  Specialty Comments:  No specialty comments available.  Imaging: No results found.   PMFS History: Patient Active Problem List   Diagnosis Date Noted   Unilateral primary osteoarthritis, right hip 07/21/2021   NSTEMI (non-ST elevated myocardial infarction) (HCC) 06/30/2018   Family hx of colon cancer 02/14/2017   STEMI (ST elevation myocardial infarction) (HCC) 05/19/2016   Acute ST elevation myocardial infarction (STEMI) involving left circumflex coronary artery without development of Q waves (HCC) 05/19/2016   Ischemic cardiomyopathy 05/19/2016   Unstable angina (HCC) 08/06/2014   ST elevation myocardial infarction involving right coronary artery (HCC)    CAD (coronary artery disease)    Tobacco abuse    HLD (hyperlipidemia)    Hypertension    Family  history of premature CAD 12/16/2013   CAD S/P PCI:  2.0 x 23 vision bare metal stent (2.25 mm) RPL 12/16/2013    Class: Acute   Schizophrenic disorder (HCC)    Bipolar 1 disorder (HCC)    Past Medical History:  Diagnosis Date   Bipolar 1 disorder (HCC)    CAD (coronary artery disease)    a. 12/16/13 inferolat STEMI s/p BMS to RPL. b. Cath 08/2014: interim occlusion of the PLA stent of the RCA, collateralized from the left coronary artery with diffuse nonobstructive CAD;  c. STEMI/PCI: LM nl, LAD nl, LCX 18m/d (2.75x12 synergy DES), RCA 30p, RPDA 75ost/p, RPAV 100 w/ L->R collats.   Chronic back pain    HLD (hyperlipidemia)    Hypertension    Ischemic cardiomyopathy    a. 12/2013: EF 50-55% by echo with +WMA. b. EF 45% by cath in 08/2014.   Ischemic cardiomyopathy 05/19/2016   Peptic ulcer disease    Schizophrenic disorder (HCC)    Tobacco abuse     Family History  Problem Relation Age of Onset   Heart attack Father 74   Heart attack Brother 10    Past Surgical History:  Procedure Laterality Date   CARDIAC CATHETERIZATION N/A 08/07/2014   Procedure: Left Heart Cath and Coronary Angiography;  Surgeon: Tonny Bollman, MD;  Location: [facility] INVASIVE CV LAB;  Service: Cardiovascular;  Laterality: N/A;   COLONOSCOPY N/A 06/06/2017   Procedure: COLONOSCOPY;  Surgeon: Malissa Hippo, MD;  Location: [facility] ENDO SUITE;  Service: Endoscopy;  Laterality: N/A;  10:50   CORONARY STENT INTERVENTION N/A 05/19/2016   Procedure: Coronary Stent Intervention;  Surgeon: Runell Gess, MD;  Location: [facility] INVASIVE CV LAB;  Service: Cardiovascular;  Laterality: N/A;   CORONARY STENT INTERVENTION N/A 07/02/2018   Procedure: CORONARY STENT INTERVENTION;  Surgeon: Lyn Records, MD;  Location: [facility] INVASIVE CV LAB;  Service: Cardiovascular;  Laterality: N/A;   HERNIA REPAIR     LEFT HEART CATH AND CORONARY ANGIOGRAPHY N/A 05/19/2016   Procedure: Left Heart Cath and Coronary Angiography;  Surgeon: Runell Gess, MD;   Location: [facility] INVASIVE CV LAB;  Service: Cardiovascular;  Laterality: N/A;   LEFT HEART CATH AND CORONARY ANGIOGRAPHY N/A 07/02/2018   Procedure: LEFT HEART CATH AND CORONARY ANGIOGRAPHY;  Surgeon: Lyn Records, MD;  Location: [facility] INVASIVE CV LAB;  Service: Cardiovascular;  Laterality: N/A;   LEFT HEART CATHETERIZATION WITH CORONARY ANGIOGRAM N/A 12/16/2013   Procedure: LEFT HEART CATHETERIZATION WITH CORONARY ANGIOGRAM;  Surgeon: Corky Crafts, MD;  Location: [facility] CATH LAB;  Service: Cardiovascular;  Laterality: N/A;   PERCUTANEOUS CORONARY STENT INTERVENTION (PCI-S)  12/16/2013   Procedure: PERCUTANEOUS CORONARY STENT INTERVENTION (PCI-S);  Surgeon: Corky Crafts, MD;  Location: [facility] CATH LAB;  Service: Cardiovascular;;  distal rca   Social History   Occupational History   Not on file  Tobacco Use   Smoking status: Every Day    Packs/day: 1.00    Years: 25.00    Total pack years: 25.00    Types: Cigarettes, Cigars    Start date: 05/25/1988   Smokeless tobacco: Former    Types: Snuff    Quit date: 05/21/1988  Vaping Use   Vaping Use: Never used  Substance and Sexual Activity   Alcohol use: No    Alcohol/week: 0.0 standard drinks of alcohol   Drug use: Yes    Types: Marijuana    Comment: last used today   Sexual activity: Not on file

## 2021-08-17 ENCOUNTER — Ambulatory Visit: Payer: Medicaid Other | Admitting: Vascular Surgery

## 2021-08-17 ENCOUNTER — Encounter: Payer: Self-pay | Admitting: Vascular Surgery

## 2021-08-17 VITALS — BP 149/89 | HR 101 | Temp 98.8°F | Ht 67.0 in | Wt 173.4 lb

## 2021-08-17 DIAGNOSIS — I739 Peripheral vascular disease, unspecified: Secondary | ICD-10-CM

## 2021-08-17 NOTE — Progress Notes (Signed)
[facility] of [place]  Patient name: Don Fletcher MRN: [id number] DOB: 12/21/1970 Sex: male  REASON FOR CONSULT: Evaluation lower extremity arterial insufficiency right leg  HPI: Don Fletcher is a 51 y.o. male, who is here today for evaluation.  He is here with his wife.  He has multiple medical difficulties [name] age of 26.  These are outlined below.  He has history of back injury at the age of 32 and has had reinjury over time.  He has not had back surgery.  He also reports a right hip injury and walks with a cane related to this.  He reports that for the last 8 months he has been having calf pain with walking.  Reports that he cannot mow his yard or walk back and forth from his mailbox due to pain.  He reports that he so severe that he "feels like cutting his leg off".  He does not have any history of tissue loss.  He does report mild calf symptoms in his left calf with walking.  He has other lower extremity symptoms.  He reveals that when he is in the shower that he has pins-and-needles sensation which is quite severe in both feet equally.  He does have a significant history of coronary disease with prior MI and prior stenting.  He had a stress test in September 2022 showing no evidence of ischemia.  Past Medical History:  Diagnosis Date   Bipolar 1 disorder (HCC)    CAD (coronary artery disease)    a. 12/16/13 inferolat STEMI s/p BMS to RPL. b. Cath 08/2014: interim occlusion of the PLA stent of the RCA, collateralized from the left coronary artery with diffuse nonobstructive CAD;  c. STEMI/PCI: LM nl, LAD nl, LCX 181m/d (2.75x12 synergy DES), RCA 30p, RPDA 75ost/p, RPAV 100 w/ L->R collats.   Chronic back pain    HLD (hyperlipidemia)    Hypertension    Ischemic cardiomyopathy    a. 12/2013: EF 50-55% by echo with +WMA. b. EF 45% by cath in 08/2014.   Ischemic cardiomyopathy 05/19/2016   Peptic ulcer disease    Schizophrenic  disorder (HCC)    Tobacco abuse     Family History  Problem Relation Age of Onset   Heart attack Father 105   Heart attack Brother 48    SOCIAL HISTORY: Social History   Socioeconomic History   Marital status: Married    Spouse name: Not on file   Number of children: Not on file   Years of education: Not on file   Highest education level: Not on file  Occupational History   Not on file  Tobacco Use   Smoking status: Every Day    Packs/day: 1.00    Years: 25.00    Total pack years: 25.00    Types: Cigarettes, Cigars    Start date: 05/25/1988   Smokeless tobacco: Former    Types: Snuff    Quit date: 05/21/1988  Vaping Use   Vaping Use: Never used  Substance and Sexual Activity   Alcohol use: No    Alcohol/week: 0.0 standard drinks of alcohol   Drug use: Yes    Types: Marijuana    Comment: last used today   Sexual activity: Not on file  Other Topics Concern   Not on file  Social History Narrative   Not on file   Social Determinants of Health   Financial Resource Strain: Not on file  Food Insecurity: Not on  file  Transportation Needs: Not on file  Physical Activity: Inactive (06/30/2018)   Exercise Vital Sign    Days of Exercise per Week: 0 days    Minutes of Exercise per Session: 0 min  Stress: Stress Concern Present (06/30/2018)   [name]    Feeling of Stress : Rather much  Social Connections: Somewhat Isolated (06/30/2018)   Social Connection and Isolation Panel [NHANES]    Frequency of Communication with Friends and Family: More than three times a week    Frequency of Social Gatherings with Friends and Family: Never    Attends Religious Services: Never    Database administrator or Organizations: No    Attends Banker Meetings: Never    Marital Status: Married  Catering manager Violence: Not At Risk (06/30/2018)   Humiliation, Afraid, Rape, and Kick questionnaire    Fear of  Current or Ex-Partner: No    Emotionally Abused: No    Physically Abused: No    Sexually Abused: No    Allergies  Allergen Reactions   Suboxone [Buprenorphine Hcl-Naloxone Hcl] Other (See Comments)    Caused kidney failure   Benadryl [Diphenhydramine] Other (See Comments)    LEG CRAMPING   Seroquel [Quetiapine] Other (See Comments)    "makes my legs cramp"   Tylenol Pm Extra [Diphenhydramine-Apap (Sleep)] Other (See Comments)    "makes my leg cramp"    Current Outpatient Medications  Medication Sig Dispense Refill   atorvastatin (LIPITOR) 80 MG tablet TAKE 1 TABLET ONCE DAILY. 30 tablet 6   benazepril (LOTENSIN) 20 MG tablet Take 20 mg by mouth daily.     omeprazole (PRILOSEC) 40 MG capsule Take 40 mg by mouth daily.     aspirin EC 81 MG tablet Take 81 mg by mouth daily. (Patient not taking: Reported on 08/17/2021)     carvedilol (COREG) 6.25 MG tablet TAKE 1 TABLET BY MOUTH 2 TIMES A DAY WITH MEALS. (Patient not taking: Reported on 08/17/2021) 30 tablet 0   famotidine (PEPCID) 20 MG tablet Take 1 tablet (20 mg total) by mouth 2 (two) times daily. (Patient not taking: Reported on 08/17/2021) 30 tablet 0   lidocaine (LIDODERM) 5 % Place 1 patch onto the skin daily. Remove & Discard patch within 12 hours or as directed by MD (Patient not taking: Reported on 08/17/2021) 30 patch 0   lisinopril-hydrochlorothiazide (ZESTORETIC) 20-12.5 MG tablet TAKE 1 TABLET ONCE DAILY. (Patient not taking: Reported on 08/17/2021) 30 tablet 6   methocarbamol (ROBAXIN) 500 MG tablet Take 1 tablet (500 mg total) by mouth 3 (three) times daily. (Patient not taking: Reported on 08/17/2021) 21 tablet 0   nitroGLYCERIN (NITROSTAT) 0.4 MG SL tablet Place 1 tablet (0.4 mg total) under the tongue every 5 (five) minutes x 3 doses as needed for chest pain. (Patient not taking: Reported on 08/17/2021) 25 tablet 0   ondansetron (ZOFRAN ODT) 4 MG disintegrating tablet Take 1 tablet (4 mg total) by mouth every 8 (eight) hours as  needed. (Patient not taking: Reported on 08/17/2021) 10 tablet 1   No current facility-administered medications for this visit.    REVIEW OF SYSTEMS:  [X]  denotes positive finding, [ ]  denotes negative finding Cardiac  Comments:  Chest pain or chest pressure: x   Shortness of breath upon exertion: x   Short of breath when lying flat: x   Irregular heart rhythm: x       Vascular  Pain in calf, thigh, or hip brought on by ambulation: x   Pain in feet at night that wakes you up from your sleep:  x   Blood clot in your veins:    Leg swelling:  x       Pulmonary    Oxygen at home:    Productive cough:  x   Wheezing:  x       Neurologic    Sudden weakness in arms or legs:  x   Sudden numbness in arms or legs:  x   Sudden onset of difficulty speaking or slurred speech:    Temporary loss of vision in one eye:     Problems with dizziness:  x       Gastrointestinal    Blood in stool:     Vomited blood:         Genitourinary    Burning when urinating:     Blood in urine:        Psychiatric    Major depression:  x       Hematologic    Bleeding problems:    Problems with blood clotting too easily:        Skin    Rashes or ulcers:        Constitutional    Fever or chills:      PHYSICAL EXAM: Vitals:   08/17/21 1259  BP: (!) 149/89  Pulse: (!) 101  Temp: 98.8 F (37.1 C)  TempSrc: Temporal  SpO2: 96%  Weight: 173 lb 6.4 oz (78.7 kg)  Height: 5\' 7"  (1.702 m)    GENERAL: The patient is a well-nourished male, in no acute distress. The vital signs are documented above. CARDIOVASCULAR: 2+ radial pulses bilaterally.  1+ right femoral and 2+ left femoral pulse.  I do palpate a palpable left popliteal and posterior tibial pulse.  He does not have palpable right popliteal or distal pulses PULMONARY: There is good air exchange  MUSCULOSKELETAL: There are no major deformities or cyanosis. NEUROLOGIC: No focal weakness or paresthesias are detected. SKIN: There are no  ulcers or rashes noted. PSYCHIATRIC: The patient has a normal affect.  DATA:  Noninvasive studies from 07/29/2021 were reviewed with the patient.  This reveals ankle arm index of 0.69 on the right and 1.0 on the left.  He had ABIs in 2019 revealing normal pressures and triphasic waveforms bilaterally  He did have a CT scan of his abdomen and pelvis on 10/31/2019.  This was a contrast study but not CT angiogram.  This was for abdominal pain.  This does show patency of his aortoiliac segments.  He does have moderate calcification and at that time moderate stenosis of his iliac arteries.  MEDICAL ISSUES: Intermittent claudication right leg with moderate arterial insufficiency.  I discussed this at length with the patient and his wife.  I explained that this is not limb threatening.  He is severely limited by his discomfort.  By physical exam he seems to have diminished right femoral pulse versus left.  I explained that his occlusive disease may be in his iliac segment or superficial femoral artery segment.  I have recommended CT angiogram of his abdomen pelvis and runoff for further evaluation.  This would then allow [place] to know what would be required for treatment.  I explained that this could could be straightforward as iliac stenting but could also require extensive operative revascularization which I would recommend deferring at this time.  He understands and wished to  proceed with CT.  I will see him back in our office for further discussion following this   Larina Earthly, MD [facility] Tel [phone] Pager [phone]  Note: Portions of this report may have been transcribed using voice recognition software.  Every effort has been made to ensure accuracy; however, inadvertent computerized transcription errors may still be present.

## 2021-08-18 ENCOUNTER — Ambulatory Visit: Payer: Medicaid Other | Admitting: Orthopaedic Surgery

## 2021-08-18 DIAGNOSIS — M1611 Unilateral primary osteoarthritis, right hip: Secondary | ICD-10-CM | POA: Diagnosis not present

## 2021-08-18 MED ORDER — PREDNISONE 5 MG (21) PO TBPK: ORAL_TABLET | ORAL | 0 refills | Status: DC

## 2021-08-18 NOTE — Progress Notes (Signed)
Office Visit Note   Patient: Don Fletcher           Date of Birth: 15-Jul-1970           MRN: [id number] Visit Date: 08/18/2021              Requested by: [name], MD [address] [place],  [place] [address] PCP: [name], MD   Assessment & Plan: Visit Diagnoses:  1. Unilateral primary osteoarthritis, right hip     Plan: Patient is distal testing for his right leg PAD and may possibly need stenting etc.  Once he is cleared by vascular surgery he can call and let us know.  I discussed with him he need cardiac clearance with his previous history of MI.  We discussed smoking cessation.  Patient also dips snuff.  We discussed spinal anesthesia direct anterior approach overnight stay for right total hip arthroplasty.  Currently is back to been giving him problems as well.  We will place him on a 5 mg prednisone Dosepak.  Follow-Up Instructions: No follow-ups on file.   Orders:  No orders of the defined types were placed in this encounter.  Meds ordered this encounter  Medications   predniSONE (STERAPRED UNI-PAK 21 TAB) 5 MG (21) TBPK tablet    Sig: Take 6,5,4,3,2,1 one tablet less each day. Take with food.    Dispense:  21 tablet    Refill:  0      Procedures: No procedures performed   Clinical Data: No additional findings.   Subjective: Chief Complaint  Patient presents with   Right Leg - Pain   Right Hip - Pain    HPI 51 year old male with progressive right hip osteoarthritis with difficulty walking.  He has bipolar disorder history of schizophrenia coronary artery disease.  Recent Doppler arterial test showed he has some moderate right arterial disease and has additional studies coming up.  Previous STEMI.  Diagnosed with ischemic cardiomyopathy he is still smoking.  He has had to use a walker and a cane to ambulate.  2 days ago his back flared up he states has been extremely painful he can barely stand up and he has problems like this it tends to last  for a week or 2 and then goes away and he is fine until it sometimes hits him a year or 2 later.  Review of Systems negative for chills or fever.  Positive for smoking coronary disease hypertension bipolar disorder, history of schizophrenia.   Objective: Vital Signs: There were no vitals taken for this visit.  Physical Exam Constitutional:      Appearance: He is well-developed.  HENT:     Head: Normocephalic and atraumatic.     Right Ear: External ear normal.     Left Ear: External ear normal.  Eyes:     Pupils: Pupils are equal, round, and reactive to light.  Neck:     Thyroid: No thyromegaly.     Trachea: No tracheal deviation.  Cardiovascular:     Rate and Rhythm: Normal rate.  Pulmonary:     Effort: Pulmonary effort is normal.     Breath sounds: No wheezing.  Abdominal:     General: Bowel sounds are normal.     Palpations: Abdomen is soft.  Musculoskeletal:     Cervical back: Neck supple.  Skin:    General: Skin is warm and dry.     Capillary Refill: Capillary refill takes less than 2 seconds.  Neurological:     Mental Status: He is alert and oriented to person, place, and time.  Psychiatric:        Behavior: Behavior normal.        Thought Content: Thought content normal.        Judgment: Judgment normal.     Ortho Exam patient has 15 degrees internal rotation right hip with extreme right groin pain.  Negative straight leg raising 90 degrees.  Some pelvic asymmetry.  Ambulatory with Trendelenburg gait.  Good capillary refill to the foot.  Specialty Comments:  No specialty comments available.  Imaging: No results found.   PMFS History: Patient Active Problem List   Diagnosis Date Noted   Unilateral primary osteoarthritis, right hip 07/21/2021   NSTEMI (non-ST elevated myocardial infarction) (HCC) 06/30/2018   Family hx of colon cancer 02/14/2017   STEMI (ST elevation myocardial infarction) (HCC) 05/19/2016   Acute ST elevation myocardial infarction  (STEMI) involving left circumflex coronary artery without development of Q waves (HCC) 05/19/2016   Ischemic cardiomyopathy 05/19/2016   Unstable angina (HCC) 08/06/2014   ST elevation myocardial infarction involving right coronary artery (HCC)    CAD (coronary artery disease)    Tobacco abuse    HLD (hyperlipidemia)    Hypertension    Family history of premature CAD 12/16/2013   CAD S/P PCI:  2.0 x 23 vision bare metal stent (2.25 mm) RPL 12/16/2013    Class: Acute   Schizophrenic disorder (HCC)    Bipolar 1 disorder (HCC)    Past Medical History:  Diagnosis Date   Bipolar 1 disorder (HCC)    CAD (coronary artery disease)    a. 12/16/13 inferolat STEMI s/p BMS to RPL. b. Cath 08/2014: interim occlusion of the PLA stent of the RCA, collateralized from the left coronary artery with diffuse nonobstructive CAD;  c. STEMI/PCI: LM nl, LAD nl, LCX 186m/d (2.75x12 synergy DES), RCA 30p, RPDA 75ost/p, RPAV 100 w/ L->R collats.   Chronic back pain    HLD (hyperlipidemia)    Hypertension    Ischemic cardiomyopathy    a. 12/2013: EF 50-55% by echo with +WMA. b. EF 45% by cath in 08/2014.   Ischemic cardiomyopathy 05/19/2016   Peptic ulcer disease    Schizophrenic disorder (HCC)    Tobacco abuse     Family History  Problem Relation Age of Onset   Heart attack Father 80   Heart attack Brother 49    Past Surgical History:  Procedure Laterality Date   CARDIAC CATHETERIZATION N/A 08/07/2014   Procedure: Left Heart Cath and Coronary Angiography;  Surgeon: Tonny Bollman, MD;  Location: [facility] INVASIVE CV LAB;  Service: Cardiovascular;  Laterality: N/A;   COLONOSCOPY N/A 06/06/2017   Procedure: COLONOSCOPY;  Surgeon: Malissa Hippo, MD;  Location: [facility] ENDO SUITE;  Service: Endoscopy;  Laterality: N/A;  10:50   CORONARY STENT INTERVENTION N/A 05/19/2016   Procedure: Coronary Stent Intervention;  Surgeon: Runell Gess, MD;  Location: [facility] INVASIVE CV LAB;  Service: Cardiovascular;  Laterality: N/A;    CORONARY STENT INTERVENTION N/A 07/02/2018   Procedure: CORONARY STENT INTERVENTION;  Surgeon: Lyn Records, MD;  Location: [facility] INVASIVE CV LAB;  Service: Cardiovascular;  Laterality: N/A;   HERNIA REPAIR     LEFT HEART CATH AND CORONARY ANGIOGRAPHY N/A 05/19/2016   Procedure: Left Heart Cath and Coronary Angiography;  Surgeon: Runell Gess, MD;  [facility] LAB;  Service: Cardiovascular;  Laterality: N/A;   LEFT HEART CATH AND  CORONARY ANGIOGRAPHY N/A 07/02/2018   Procedure: LEFT HEART CATH AND CORONARY ANGIOGRAPHY;  Surgeon: Lyn Records, MD;  Location: [facility] INVASIVE CV LAB;  Service: Cardiovascular;  Laterality: N/A;   LEFT HEART CATHETERIZATION WITH CORONARY ANGIOGRAM N/A 12/16/2013   Procedure: LEFT HEART CATHETERIZATION WITH CORONARY ANGIOGRAM;  Surgeon: Corky Crafts, MD;  Location[facility] CATH LAB;  Service: Cardiovascular;  Laterality: N/A;   PERCUTANEOUS CORONARY STENT INTERVENTION (PCI-S)  12/16/2013   Procedure: PERCUTANEOUS CORONARY STENT INTERVENTION (PCI-S);  Surgeon: Corky Crafts, MD;  Location: [facility] CATH LAB;  Service: Cardiovascular;;  distal rca   Social History   Occupational History   Not on file  Tobacco Use   Smoking status: Every Day    Packs/day: 1.00    Years: 25.00    Total pack years: 25.00    Types: Cigarettes, Cigars    Start date: 05/25/1988   Smokeless tobacco: Former    Types: Snuff    Quit date: 05/21/1988  Vaping Use   Vaping Use: Never used  Substance and Sexual Activity   Alcohol use: No    Alcohol/week: 0.0 standard drinks of alcohol   Drug use: Yes    Types: Marijuana    Comment: last used today   Sexual activity: Not on file

## 2021-08-19 ENCOUNTER — Other Ambulatory Visit: Payer: Self-pay

## 2021-08-19 DIAGNOSIS — I739 Peripheral vascular disease, unspecified: Secondary | ICD-10-CM

## 2021-08-22 NOTE — Addendum Note (Signed)
Addended byWaynetta Pean on: 08/22/2021 10:58 AM   Modules accepted: Orders

## 2021-08-30 ENCOUNTER — Ambulatory Visit (HOSPITAL_COMMUNITY)
Admission: RE | Admit: 2021-08-30 | Discharge: 2021-08-30 | Disposition: A | Discharge status: Home / Self Care | Payer: Medicaid Other | Source: Ambulatory Visit | Attending: Vascular Surgery | Admitting: Vascular Surgery

## 2021-08-30 DIAGNOSIS — I739 Peripheral vascular disease, unspecified: Secondary | ICD-10-CM | POA: Insufficient documentation

## 2021-08-30 MED ORDER — IOHEXOL 350 MG/ML SOLN
100.0000 mL | Freq: Once | INTRAVENOUS | Status: AC | PRN
  Administered 2021-08-30: 100 mL via INTRAVENOUS

## 2021-08-31 ENCOUNTER — Encounter: Payer: Self-pay | Admitting: Vascular Surgery

## 2021-08-31 ENCOUNTER — Ambulatory Visit (INDEPENDENT_AMBULATORY_CARE_PROVIDER_SITE_OTHER): Payer: Medicaid Other | Admitting: Vascular Surgery

## 2021-08-31 VITALS — BP 170/102 | HR 85 | Temp 100.2°F | Resp 14 | Ht 67.0 in | Wt 176.0 lb

## 2021-08-31 DIAGNOSIS — I739 Peripheral vascular disease, unspecified: Secondary | ICD-10-CM | POA: Diagnosis not present

## 2021-08-31 NOTE — H&P (View-Only) (Signed)
  V[facility] and Vein Specialist of [place]  Patient name: Don Fletcher MRN: [id number] DOB: 07/21/1970 Sex: male  REASON FOR VISIT: Follow-up to discuss recent CT angiogram  HPI: Don Fletcher is a 51 y.o. male here today for follow-up.  He is here with his wife.  I saw him in the office several weeks ago where he was complaining of bilateral lower extremity much worse on the right than on the left.  He has multiple components of his complaints.  He does appear to have relatively straightforward right leg claudication.  I recommended CT angiogram for further evaluation.  This was done on 08/30/2021 and is here today for discussion of this.  Past Medical History:  Diagnosis Date   Bipolar 1 disorder (HCC)    CAD (coronary artery disease)    a. 12/16/13 inferolat STEMI s/p BMS to RPL. b. Cath 08/2014: interim occlusion of the PLA stent of the RCA, collateralized from the left coronary artery with diffuse nonobstructive CAD;  c. STEMI/PCI: LM nl, LAD nl, LCX 100m/d (2.75x12 synergy DES), RCA 30p, RPDA 75ost/p, RPAV 100 w/ L->R collats.   Chronic back pain    HLD (hyperlipidemia)    Hypertension    Ischemic cardiomyopathy    a. 12/2013: EF 50-55% by echo with +WMA. b. EF 45% by cath in 08/2014.   Ischemic cardiomyopathy 05/19/2016   Peptic ulcer disease    Schizophrenic disorder (HCC)    Tobacco abuse     Family History  Problem Relation Age of Onset   Heart attack Father 40   Heart attack Brother 40    SOCIAL HISTORY: Social History   Tobacco Use   Smoking status: Every Day    Packs/day: 1.00    Years: 25.00    Total pack years: 25.00    Types: Cigarettes, Cigars    Start date: 05/25/1988   Smokeless tobacco: Former    Types: Snuff    Quit date: 05/21/1988  Substance Use Topics   Alcohol use: No    Alcohol/week: 0.0 standard drinks of alcohol    Allergies  Allergen Reactions   Suboxone [Buprenorphine Hcl-Naloxone Hcl] Other (See  Comments)    Caused kidney failure   Benadryl [Diphenhydramine] Other (See Comments)    LEG CRAMPING   Seroquel [Quetiapine] Other (See Comments)    "makes my legs cramp"   Tylenol Pm Extra [Diphenhydramine-Apap (Sleep)] Other (See Comments)    "makes my leg cramp"    Current Outpatient Medications  Medication Sig Dispense Refill   atorvastatin (LIPITOR) 80 MG tablet TAKE 1 TABLET ONCE DAILY. 30 tablet 6   benazepril (LOTENSIN) 20 MG tablet Take 20 mg by mouth daily.     omeprazole (PRILOSEC) 40 MG capsule Take 40 mg by mouth daily.     aspirin EC 81 MG tablet Take 81 mg by mouth daily. (Patient not taking: Reported on 08/17/2021)     carvedilol (COREG) 6.25 MG tablet TAKE 1 TABLET BY MOUTH 2 TIMES A DAY WITH MEALS. (Patient not taking: Reported on 08/17/2021) 30 tablet 0   famotidine (PEPCID) 20 MG tablet Take 1 tablet (20 mg total) by mouth 2 (two) times daily. (Patient not taking: Reported on 08/17/2021) 30 tablet 0   lidocaine (LIDODERM) 5 % Place 1 patch onto the skin daily. Remove & Discard patch within 12 hours or as directed by MD (Patient not taking: Reported on 08/17/2021) 30 patch 0   lisinopril-hydrochlorothiazide (ZESTORETIC) 20-12.5 MG tablet TAKE 1 TABLET ONCE DAILY. (Patient   not taking: Reported on 08/17/2021) 30 tablet 6   methocarbamol (ROBAXIN) 500 MG tablet Take 1 tablet (500 mg total) by mouth 3 (three) times daily. (Patient not taking: Reported on 08/17/2021) 21 tablet 0   nitroGLYCERIN (NITROSTAT) 0.4 MG SL tablet Place 1 tablet (0.4 mg total) under the tongue every 5 (five) minutes x 3 doses as needed for chest pain. (Patient not taking: Reported on 08/17/2021) 25 tablet 0   ondansetron (ZOFRAN ODT) 4 MG disintegrating tablet Take 1 tablet (4 mg total) by mouth every 8 (eight) hours as needed. (Patient not taking: Reported on 08/17/2021) 10 tablet 1   predniSONE (STERAPRED UNI-PAK 21 TAB) 5 MG (21) TBPK tablet Take 6,5,4,3,2,1 one tablet less each day. Take with food. (Patient  not taking: Reported on 08/31/2021) 21 tablet 0   No current facility-administered medications for this visit.    REVIEW OF SYSTEMS:  [X]  denotes positive finding, [ ]  denotes negative finding Cardiac  Comments:  Chest pain or chest pressure:    Shortness of breath upon exertion:    Short of breath when lying flat:    Irregular heart rhythm:        Vascular    Pain in calf, thigh, or hip brought on by ambulation:    Pain in feet at night that wakes you up from your sleep:     Blood clot in your veins:    Leg swelling:           PHYSICAL EXAM: Vitals:   08/31/21 1403  BP: (!) 170/102  Pulse: 85  Resp: 14  Temp: 100.2 F (37.9 C)  TempSrc: Temporal  SpO2: 97%  Weight: 176 lb (79.8 kg)  Height: 5\' 7"  (1.702 m)     DATA:  CT scan shows no evidence of significant arterial occlusive disease in the left iliac or distally.  On the right leg he does have a focal moderate to severe stenosis in the external iliac above the inguinal ligament.  He also has stenosis in the right superficial femoral artery and mid thigh.  He has normal popliteal and three-vessel runoff.  MEDICAL ISSUES: Had a long discussion with the patient and his wife regarding this.  I explained that he does not have any evidence of limb threatening ischemia.  He reports that he is unable to tolerate the discomfort in his right leg.  He reports that he is unable to mow or do even simple tasks around his home without having to stop and rest.  I explained that certainly his foot numbness and left leg symptoms could not be related to arterial insufficiency.  He wishes to proceed with arteriography and possible intervention.  I did explain the procedure as an outpatient in all likelihood through the left groin with arteriogram and hopeful intervention in his right external iliac and possibly right SFA stenosis.  He understands the procedure and potential complications.  Also understands that he may not have improvement with  this intervention since there are clearly other causes for his lower extremity symptoms as well.  We will proceed at his earliest convenience.  He has had coronary angioplasty and arteriography on 3 separate occasions so is familiar with the process    , MD FACS [facility] [place] Office Tel [phone]  Note: Portions of this report may have been transcribed using voice recognition software.  Every effort has been made to ensure accuracy; however, inadvertent computerized transcription errors may still be present.

## 2021-08-31 NOTE — Progress Notes (Signed)
[facility] [place]  Patient name: Don Fletcher MRN: [id number] DOB: 30-Jul-1970 Sex: male  REASON FOR VISIT: Follow-up to discuss recent CT angiogram  HPI: Don Fletcher is a 51 y.o. male here today for follow-up.  He is here with his wife.  I saw him in the office several weeks ago where he was complaining of bilateral lower extremity much worse on the right than on the left.  He has multiple components of his complaints.  He does appear to have relatively straightforward right leg claudication.  I recommended CT angiogram for further evaluation.  This was done on 08/30/2021 and is here today for discussion of this.  Past Medical History:  Diagnosis Date   Bipolar 1 disorder (HCC)    CAD (coronary artery disease)    a. 12/16/13 inferolat STEMI s/p BMS to RPL. b. Cath 08/2014: interim occlusion of the PLA stent of the RCA, collateralized from the left coronary artery with diffuse nonobstructive CAD;  c. STEMI/PCI: LM nl, LAD nl, LCX 139m/d (2.75x12 synergy DES), RCA 30p, RPDA 75ost/p, RPAV 100 w/ L->R collats.   Chronic back pain    HLD (hyperlipidemia)    Hypertension    Ischemic cardiomyopathy    a. 12/2013: EF 50-55% by echo with +WMA. b. EF 45% by cath in 08/2014.   Ischemic cardiomyopathy 05/19/2016   Peptic ulcer disease    Schizophrenic disorder (HCC)    Tobacco abuse     Family History  Problem Relation Age of Onset   Heart attack Father 27   Heart attack Brother 30    SOCIAL HISTORY: Social History   Tobacco Use   Smoking status: Every Day    Packs/day: 1.00    Years: 25.00    Total pack years: 25.00    Types: Cigarettes, Cigars    Start date: 05/25/1988   Smokeless tobacco: Former    Types: Snuff    Quit date: 05/21/1988  Substance Use Topics   Alcohol use: No    Alcohol/week: 0.0 standard drinks of alcohol    Allergies  Allergen Reactions   Suboxone [Buprenorphine Hcl-Naloxone Hcl] Other (See  Comments)    Caused kidney failure   Benadryl [Diphenhydramine] Other (See Comments)    LEG CRAMPING   Seroquel [Quetiapine] Other (See Comments)    "makes my legs cramp"   Tylenol Pm Extra [Diphenhydramine-Apap (Sleep)] Other (See Comments)    "makes my leg cramp"    Current Outpatient Medications  Medication Sig Dispense Refill   atorvastatin (LIPITOR) 80 MG tablet TAKE 1 TABLET ONCE DAILY. 30 tablet 6   benazepril (LOTENSIN) 20 MG tablet Take 20 mg by mouth daily.     omeprazole (PRILOSEC) 40 MG capsule Take 40 mg by mouth daily.     aspirin EC 81 MG tablet Take 81 mg by mouth daily. (Patient not taking: Reported on 08/17/2021)     carvedilol (COREG) 6.25 MG tablet TAKE 1 TABLET BY MOUTH 2 TIMES A DAY WITH MEALS. (Patient not taking: Reported on 08/17/2021) 30 tablet 0   famotidine (PEPCID) 20 MG tablet Take 1 tablet (20 mg total) by mouth 2 (two) times daily. (Patient not taking: Reported on 08/17/2021) 30 tablet 0   lidocaine (LIDODERM) 5 % Place 1 patch onto the skin daily. Remove & Discard patch within 12 hours or as directed by MD (Patient not taking: Reported on 08/17/2021) 30 patch 0   lisinopril-hydrochlorothiazide (ZESTORETIC) 20-12.5 MG tablet TAKE 1 TABLET ONCE DAILY. (Patient  not taking: Reported on 08/17/2021) 30 tablet 6   methocarbamol (ROBAXIN) 500 MG tablet Take 1 tablet (500 mg total) by mouth 3 (three) times daily. (Patient not taking: Reported on 08/17/2021) 21 tablet 0   nitroGLYCERIN (NITROSTAT) 0.4 MG SL tablet Place 1 tablet (0.4 mg total) under the tongue every 5 (five) minutes x 3 doses as needed for chest pain. (Patient not taking: Reported on 08/17/2021) 25 tablet 0   ondansetron (ZOFRAN ODT) 4 MG disintegrating tablet Take 1 tablet (4 mg total) by mouth every 8 (eight) hours as needed. (Patient not taking: Reported on 08/17/2021) 10 tablet 1   predniSONE (STERAPRED UNI-PAK 21 TAB) 5 MG (21) TBPK tablet Take 6,5,4,3,2,1 one tablet less each day. Take with food. (Patient  not taking: Reported on 08/31/2021) 21 tablet 0   No current facility-administered medications for this visit.    REVIEW OF SYSTEMS:  [X]  denotes positive finding, [ ]  denotes negative finding Cardiac  Comments:  Chest pain or chest pressure:    Shortness of breath upon exertion:    Short of breath when lying flat:    Irregular heart rhythm:        Vascular    Pain in calf, thigh, or hip brought on by ambulation:    Pain in feet at night that wakes you up from your sleep:     Blood clot in your veins:    Leg swelling:           PHYSICAL EXAM: Vitals:   08/31/21 1403  BP: (!) 170/102  Pulse: 85  Resp: 14  Temp: 100.2 F (37.9 C)  TempSrc: Temporal  SpO2: 97%  Weight: 176 lb (79.8 kg)  Height: 5\' 7"  (1.702 m)     DATA:  CT scan shows no evidence of significant arterial occlusive disease in the left iliac or distally.  On the right leg he does have a focal moderate to severe stenosis in the external iliac above the inguinal ligament.  He also has stenosis in the right superficial femoral artery and mid thigh.  He has normal popliteal and three-vessel runoff.  MEDICAL ISSUES: Had a long discussion with the patient and his wife regarding this.  I explained that he does not have any evidence of limb threatening ischemia.  He reports that he is unable to tolerate the discomfort in his right leg.  He reports that he is unable to mow or do even simple tasks around his home without having to stop and rest.  I explained that certainly his foot numbness and left leg symptoms could not be related to arterial insufficiency.  He wishes to proceed with arteriography and possible intervention.  I did explain the procedure as an outpatient in all likelihood through the left groin with arteriogram and hopeful intervention in his right external iliac and possibly right SFA stenosis.  He understands the procedure and potential complications.  Also understands that he may not have improvement with  this intervention since there are clearly other causes for his lower extremity symptoms as well.  We will proceed at his earliest convenience.  He has had coronary angioplasty and arteriography on 3 separate occasions so is familiar with the process    , MD FACS [facility] [place] Office Tel [phone]  Note: Portions of this report may have been transcribed using voice recognition software.  Every effort has been made to ensure accuracy; however, inadvertent computerized transcription errors may still be present.

## 2021-09-07 ENCOUNTER — Other Ambulatory Visit: Payer: Self-pay

## 2021-09-07 DIAGNOSIS — I739 Peripheral vascular disease, unspecified: Secondary | ICD-10-CM

## 2021-09-09 ENCOUNTER — Other Ambulatory Visit: Payer: Self-pay

## 2021-09-09 ENCOUNTER — Observation Stay (HOSPITAL_BASED_OUTPATIENT_CLINIC_OR_DEPARTMENT_OTHER): Payer: Medicaid Other

## 2021-09-09 ENCOUNTER — Ambulatory Visit (HOSPITAL_COMMUNITY)
Admission: RE | Disposition: A | Discharge status: Home / Self Care | Payer: Self-pay | Source: Home / Self Care | Attending: Vascular Surgery

## 2021-09-09 ENCOUNTER — Observation Stay (HOSPITAL_COMMUNITY)
Admission: RE | Admit: 2021-09-09 | Discharge: 2021-09-10 | Disposition: A | Discharge status: Home / Self Care | Payer: Medicaid Other | Attending: Vascular Surgery | Admitting: Vascular Surgery

## 2021-09-09 DIAGNOSIS — Z87891 Personal history of nicotine dependence: Secondary | ICD-10-CM | POA: Diagnosis not present

## 2021-09-09 DIAGNOSIS — Z9889 Other specified postprocedural states: Secondary | ICD-10-CM | POA: Diagnosis not present

## 2021-09-09 DIAGNOSIS — I708 Atherosclerosis of other arteries: Secondary | ICD-10-CM | POA: Diagnosis not present

## 2021-09-09 DIAGNOSIS — Z79899 Other long term (current) drug therapy: Secondary | ICD-10-CM | POA: Insufficient documentation

## 2021-09-09 DIAGNOSIS — I739 Peripheral vascular disease, unspecified: Secondary | ICD-10-CM | POA: Diagnosis present

## 2021-09-09 DIAGNOSIS — I10 Essential (primary) hypertension: Secondary | ICD-10-CM | POA: Diagnosis not present

## 2021-09-09 DIAGNOSIS — I2510 Atherosclerotic heart disease of native coronary artery without angina pectoris: Secondary | ICD-10-CM | POA: Diagnosis not present

## 2021-09-09 DIAGNOSIS — Z7982 Long term (current) use of aspirin: Secondary | ICD-10-CM | POA: Diagnosis not present

## 2021-09-09 DIAGNOSIS — I70211 Atherosclerosis of native arteries of extremities with intermittent claudication, right leg: Principal | ICD-10-CM | POA: Insufficient documentation

## 2021-09-09 HISTORY — PX: ABDOMINAL AORTOGRAM W/LOWER EXTREMITY: CATH118223

## 2021-09-09 HISTORY — PX: PERIPHERAL VASCULAR INTERVENTION: CATH118257

## 2021-09-09 HISTORY — PX: PERIPHERAL VASCULAR BALLOON ANGIOPLASTY: CATH118281

## 2021-09-09 LAB — POCT I-STAT, CHEM 8
BUN: 13 mg/dL (ref 6–20)
Calcium, Ion: 1.15 mmol/L (ref 1.15–1.40)
Chloride: 106 mmol/L (ref 98–111)
Creatinine, Ser: 0.9 mg/dL (ref 0.61–1.24)
Glucose, Bld: 117 mg/dL — ABNORMAL HIGH (ref 70–99)
HCT: 49 % (ref 39.0–52.0)
Hemoglobin: 16.7 g/dL (ref 13.0–17.0)
Potassium: 5.6 mmol/L — ABNORMAL HIGH (ref 3.5–5.1)
Sodium: 138 mmol/L (ref 135–145)
TCO2: 24 mmol/L (ref 22–32)

## 2021-09-09 LAB — POCT ACTIVATED CLOTTING TIME
Activated Clotting Time: 275 seconds
Activated Clotting Time: 293 seconds
Activated Clotting Time: 233 seconds

## 2021-09-09 SURGERY — ABDOMINAL AORTOGRAM W/LOWER EXTREMITY
Anesthesia: LOCAL | Laterality: Bilateral

## 2021-09-09 MED ORDER — ACETAMINOPHEN 325 MG PO TABS
650.0000 mg | ORAL_TABLET | ORAL | Status: DC | PRN
  Administered 2021-09-09: 650 mg via ORAL
  Filled 2021-09-09: qty 2

## 2021-09-09 MED ORDER — KCL IN DEXTROSE-NACL 20-5-0.45 MEQ/L-%-% IV SOLN
INTRAVENOUS | Status: DC
  Filled 2021-09-09: qty 1000

## 2021-09-09 MED ORDER — CLOPIDOGREL BISULFATE 75 MG PO TABS: 75.0000 mg | ORAL_TABLET | Freq: Every day | ORAL | 11 refills | Status: DC

## 2021-09-09 MED ORDER — GUAIFENESIN-DM 100-10 MG/5ML PO SYRP: 15.0000 mL | ORAL_SOLUTION | ORAL | Status: DC | PRN

## 2021-09-09 MED ORDER — HYDRALAZINE HCL 20 MG/ML IJ SOLN: 5.0000 mg | INTRAMUSCULAR | Status: DC | PRN

## 2021-09-09 MED ORDER — MIDAZOLAM HCL 2 MG/2ML IJ SOLN
INTRAMUSCULAR | Status: DC | PRN
  Administered 2021-09-09 (×2): 1 mg via INTRAVENOUS

## 2021-09-09 MED ORDER — PHENOL 1.4 % MT LIQD: 1.0000 | OROMUCOSAL | Status: DC | PRN

## 2021-09-09 MED ORDER — CLOPIDOGREL BISULFATE 300 MG PO TABS
ORAL_TABLET | ORAL | Status: DC | PRN
  Administered 2021-09-09: 300 mg via ORAL

## 2021-09-09 MED ORDER — ASPIRIN 81 MG PO TBEC
DELAYED_RELEASE_TABLET | ORAL | Status: DC | PRN
  Administered 2021-09-09: 81 mg via ORAL

## 2021-09-09 MED ORDER — MIDAZOLAM HCL 2 MG/2ML IJ SOLN
INTRAMUSCULAR | Status: AC
  Filled 2021-09-09: qty 2

## 2021-09-09 MED ORDER — ACETAMINOPHEN 325 MG RE SUPP: 325.0000 mg | RECTAL | Status: DC | PRN

## 2021-09-09 MED ORDER — PANTOPRAZOLE SODIUM 40 MG PO TBEC
40.0000 mg | DELAYED_RELEASE_TABLET | Freq: Every day | ORAL | Status: DC
  Administered 2021-09-09 – 2021-09-10 (×2): 40 mg via ORAL
  Filled 2021-09-09 (×2): qty 1

## 2021-09-09 MED ORDER — LIDOCAINE HCL (PF) 1 % IJ SOLN
INTRAMUSCULAR | Status: DC | PRN
  Administered 2021-09-09: 15 mL

## 2021-09-09 MED ORDER — OXYCODONE-ACETAMINOPHEN 5-325 MG PO TABS
1.0000 | ORAL_TABLET | ORAL | Status: DC | PRN
  Administered 2021-09-09 – 2021-09-10 (×5): 2 via ORAL
  Filled 2021-09-09 (×5): qty 2

## 2021-09-09 MED ORDER — METOPROLOL TARTRATE 5 MG/5ML IV SOLN: 2.0000 mg | INTRAVENOUS | Status: DC | PRN

## 2021-09-09 MED ORDER — LABETALOL HCL 5 MG/ML IV SOLN: 10.0000 mg | INTRAVENOUS | Status: DC | PRN

## 2021-09-09 MED ORDER — FENTANYL CITRATE (PF) 100 MCG/2ML IJ SOLN
INTRAMUSCULAR | Status: AC
  Filled 2021-09-09: qty 2

## 2021-09-09 MED ORDER — HEPARIN SODIUM (PORCINE) 1000 UNIT/ML IJ SOLN
INTRAMUSCULAR | Status: AC
  Filled 2021-09-09: qty 10

## 2021-09-09 MED ORDER — ALUM & MAG HYDROXIDE-SIMETH 200-200-20 MG/5ML PO SUSP: 15.0000 mL | ORAL | Status: DC | PRN

## 2021-09-09 MED ORDER — ASPIRIN 81 MG PO TBEC: 81.0000 mg | DELAYED_RELEASE_TABLET | Freq: Every day | ORAL | 2 refills | Status: DC

## 2021-09-09 MED ORDER — ASPIRIN 81 MG PO TBEC
81.0000 mg | DELAYED_RELEASE_TABLET | Freq: Every day | ORAL | Status: DC
  Administered 2021-09-10: 81 mg via ORAL
  Filled 2021-09-09: qty 1

## 2021-09-09 MED ORDER — CLOPIDOGREL BISULFATE 300 MG PO TABS
ORAL_TABLET | ORAL | Status: AC
  Filled 2021-09-09: qty 1

## 2021-09-09 MED ORDER — SODIUM CHLORIDE 0.9% FLUSH: 3.0000 mL | INTRAVENOUS | Status: DC | PRN

## 2021-09-09 MED ORDER — HEPARIN (PORCINE) IN NACL 1000-0.9 UT/500ML-% IV SOLN
INTRAVENOUS | Status: AC
  Filled 2021-09-09: qty 1000

## 2021-09-09 MED ORDER — SODIUM CHLORIDE 0.9 % IV SOLN: INTRAVENOUS | Status: DC

## 2021-09-09 MED ORDER — CLOPIDOGREL BISULFATE 75 MG PO TABS
75.0000 mg | ORAL_TABLET | Freq: Every day | ORAL | Status: DC
  Administered 2021-09-10: 75 mg via ORAL
  Filled 2021-09-09: qty 1

## 2021-09-09 MED ORDER — ONDANSETRON HCL 4 MG/2ML IJ SOLN
INTRAMUSCULAR | Status: AC
  Filled 2021-09-09: qty 2

## 2021-09-09 MED ORDER — ASPIRIN 81 MG PO CHEW
CHEWABLE_TABLET | ORAL | Status: AC
  Filled 2021-09-09: qty 1

## 2021-09-09 MED ORDER — HEPARIN SODIUM (PORCINE) 1000 UNIT/ML IJ SOLN
INTRAMUSCULAR | Status: DC | PRN
  Administered 2021-09-09: 8000 [IU] via INTRAVENOUS

## 2021-09-09 MED ORDER — SODIUM CHLORIDE 0.9 % WEIGHT BASED INFUSION: 1.0000 mL/kg/h | INTRAVENOUS | Status: AC

## 2021-09-09 MED ORDER — LIDOCAINE HCL (PF) 1 % IJ SOLN
INTRAMUSCULAR | Status: AC
  Filled 2021-09-09: qty 30

## 2021-09-09 MED ORDER — ONDANSETRON HCL 4 MG/2ML IJ SOLN: 4.0000 mg | Freq: Four times a day (QID) | INTRAMUSCULAR | Status: DC | PRN

## 2021-09-09 MED ORDER — POTASSIUM CHLORIDE CRYS ER 20 MEQ PO TBCR: 20.0000 meq | EXTENDED_RELEASE_TABLET | Freq: Once | ORAL | Status: DC

## 2021-09-09 MED ORDER — FENTANYL CITRATE (PF) 100 MCG/2ML IJ SOLN
INTRAMUSCULAR | Status: DC | PRN
  Administered 2021-09-09 (×2): 50 ug via INTRAVENOUS

## 2021-09-09 MED ORDER — IODIXANOL 320 MG/ML IV SOLN
INTRAVENOUS | Status: DC | PRN
  Administered 2021-09-09: 180 mL

## 2021-09-09 MED ORDER — SODIUM CHLORIDE 0.9% FLUSH
3.0000 mL | Freq: Two times a day (BID) | INTRAVENOUS | Status: DC
Start: 2021-09-09 — End: 2021-09-10
  Administered 2021-09-09: 3 mL via INTRAVENOUS

## 2021-09-09 MED ORDER — ONDANSETRON HCL 4 MG/2ML IJ SOLN
4.0000 mg | Freq: Four times a day (QID) | INTRAMUSCULAR | Status: DC | PRN
  Administered 2021-09-10: 4 mg via INTRAVENOUS
  Filled 2021-09-09: qty 2

## 2021-09-09 MED ORDER — SODIUM CHLORIDE 0.9 % IV SOLN: 250.0000 mL | INTRAVENOUS | Status: DC | PRN

## 2021-09-09 MED ORDER — ACETAMINOPHEN 325 MG PO TABS: 325.0000 mg | ORAL_TABLET | ORAL | Status: DC | PRN

## 2021-09-09 SURGICAL SUPPLY — 24 items
BALLN MUSTANG 6.0X40 135 (BALLOONS) ×3
BALLN STERLING OTW 4X20X135 (BALLOONS) ×3
BALLOON MUSTANG 6.0X40 135 (BALLOONS) IMPLANT
BALLOON STERLING OTW 4X20X135 (BALLOONS) IMPLANT
CATH ANGIO 5F PIGTAIL 65CM (CATHETERS) ×1 IMPLANT
CATH CROSS OVER TEMPO 5F (CATHETERS) ×1 IMPLANT
CATH QUICKCROSS SUPP .035X90CM (MICROCATHETER) ×1 IMPLANT
CLOSURE MYNX CONTROL 6F/7F (Vascular Products) ×1 IMPLANT
DCB RANGER 5.0X40 135 (BALLOONS) IMPLANT
KIT ENCORE 26 ADVANTAGE (KITS) ×1 IMPLANT
KIT MICROPUNCTURE NIT STIFF (SHEATH) ×1 IMPLANT
KIT PV (KITS) ×3 IMPLANT
RANGER DCB 5.0X40 135 (BALLOONS) ×3
SHEATH PINNACLE 5F 10CM (SHEATH) ×1 IMPLANT
SHEATH PINNACLE 6F 10CM (SHEATH) ×1 IMPLANT
SHEATH PINNACLE ST 6F 65CM (SHEATH) ×1 IMPLANT
SHEATH PROBE COVER 6X72 (BAG) ×1 IMPLANT
STENT ELUVIA 7X40X130 (Permanent Stent) ×1 IMPLANT
SYR MEDRAD MARK V 150ML (SYRINGE) ×1 IMPLANT
TRANSDUCER W/STOPCOCK (MISCELLANEOUS) ×3 IMPLANT
TRAY PV CATH (CUSTOM PROCEDURE TRAY) ×3 IMPLANT
WIRE G V18X300CM (WIRE) ×1 IMPLANT
WIRE HITORQ VERSACORE ST 145CM (WIRE) ×1 IMPLANT
WIRE VERSACORE LOC 115CM (WIRE) ×1 IMPLANT

## 2021-09-09 NOTE — Progress Notes (Signed)
Arrived to room from cath lab reporting nausea, IV Zofran given as ordered by Dr. Edilia Bo by Page, Charge RN with relief. Left groin noted to have firm area above puncture site. Manual pressure held by cath lab for 10 minutes. Area noted to be softer. Dr. Edilia Bo in briefly and aware. No new orders received.

## 2021-09-09 NOTE — Op Note (Signed)
PATIENT: Don Fletcher      MRN: [id number] DOB: March 04, 1970    DATE OF PROCEDURE: 09/09/2021  INDICATIONS:    Don Fletcher is a 51 y.o. male who was seen by Dr. Sherren Mocha Early with disabling claudication of the right lower extremity.  CT angiogram suggested an external iliac artery stenosis and a right superficial femoral artery stenosis.  He was set up for arteriography and possible intervention  PROCEDURE:    Conscious sedation Ultrasound-guided access to the left common femoral artery Aortogram with bilateral iliac arteriogram and bilateral lower extremity runoff Selective catheterization of the right superficial femoral artery (third order catheterization) Drug-coated balloon angioplasty of the right superficial femoral artery Angioplasty and placement of a drug-eluting stent  the right external iliac artery Minx closure left common femoral artery  SURGEON: Judeth Cornfield. Scot Dock, MD, FACS  ANESTHESIA: Local with sedation  EBL: Minimal  TECHNIQUE: The patient was brought to the peripheral vascular lab and was sedated. The period of conscious sedation was 91 minutes.  During that time period, I was present face-to-face 100% of the time.  The patient was administered 1 mg of Versed and 50 mcg of fentanyl initially. The patient's heart rate, blood pressure, and oxygen saturation were monitored by the nurse continuously during the procedure.  Both groins were prepped and draped in the usual sterile fashion.  Under ultrasound guidance, after the skin was anesthetized, I cannulated the left common femoral artery with a micropuncture needle and a micropuncture sheath was introduced over a wire.  This was exchanged for a 5 [place] sheath over a Bentson wire.  By ultrasound the femoral artery was patent. A real-time image was obtained and sent to the server.  The pigtail catheter was positioned at the L1 vertebral body and flush aortogram obtained.  The catheter was then positioned above the  aortic bifurcation and an oblique iliac projection was obtained.  Bilateral lower extremity runoff films were then obtained.  This demonstrated an 80% stenosis in the right external iliac artery that was fairly focal.  There was also a focal 80% stenosis in the right superficial femoral artery.   I then exchanged the pigtail catheter for a crossover catheter which was positioned into the right common iliac artery.  I then advanced the wire down into the superficial femoral artery.  I then exchanged the short 5 French sheath in the left groin for a 65 cm 6 [place] destination sheath.  This was initially positioned in the superficial femoral artery on the right.  The patient was heparinized and ACT was monitored throughout the procedure.  I initially addressed the right superficial femoral artery stenosis.  The wire was exchanged for a 0.018 system using a quick cross catheter and the lesion was crossed with a wire.  I initially addressed the SFA stenosis with a 4 mm x 20 mm Sterling balloon which was inflated to 10 atm.  I then went back with a 5 mm x 40 mm drug coated balloon (Ranger) which was inflated to 10 atm for 3 minutes.  Completion films showed no residual stenosis.  I then went on to address the right external iliac artery stenosis.  A 7 mm x 40 mm drug-eluting stent was selected.  This was positioned across the area of stenosis but above the inguinal ligament and deployed without difficulty.  Postdilatation was done with a 6 mm x 40 mm Mustang balloon.  Completion films showed no residual stenosis.  I then did a runoff film  on the right and there was no change distally.  At the completion of the procedure the long 6 French sheath was exchanged for a short 6 [place] sheath.  The left groin was then closed with the Mynx device without immediate complication.  FINDINGS:   Single renal arteries bilaterally with no significant renal artery stenosis identified.  The infrarenal aorta is patent although  somewhat small.  On the left side the common iliac, external iliac, hypogastric, common femoral, deep femoral, superficial femoral, popliteal, anterior tibial, tibial peroneal trunk, peroneal, and posterior tibial arteries are all patent.  There is poor visualization distally because of washout. On the right side the common iliac artery is patent.  The hypogastric artery is patent.  There was an 80% focal right external iliac artery stenosis above the inguinal ligament which was successfully addressed with a drug-eluting stent as described above.  Below that the common femoral and deep femoral artery are patent.  The superficial femoral artery is patent although there is a focal 80% stenosis in the mid thigh.  This was successfully addressed with a drug-coated balloon as described above.  Below that the superficial femoral artery, popliteal, anterior tibial, tibial peroneal trunk, peroneal, and posterior tibial arteries are patent.  There is poor visualization distally because of washout.    CLINICAL NOTE: The patient is on aspirin and is on a statin.  I have sent him a prescription for Plavix to his pharmacy and he was loaded with Plavix today.  He would prefer to follow-up and even with Dr. Curt Jews.  I will arrange a follow-up in approximately 6 weeks.  TASC Classification  Largest Sheath Size: 6 [place]  Target vessel: Right external iliac artery and right superficial femoral artery  % Stenosis: Pre 80%. Post 0%.  For both lesions.  Lesion length: 2 cm for both lesions.  Calcification: Minimal  Outflow: Disease present or not distal to the lesion treated and the  Flow in the distal vessel: No   Deitra Mayo, MD, FACS [facility] [place] DICTATION:   09/09/2021

## 2021-09-09 NOTE — Progress Notes (Signed)
MD ordered potassium IV and PO for patient. According to lab work obtained this morning, K+ is 5.6. Edilia Bo, MD notified of this information and informed this RN to hold ordered potassium. Orders followed. Will continue to monitor.

## 2021-09-09 NOTE — Progress Notes (Signed)
VASCULAR SURGERY:  This patient got up to ambulate and developed some pain in the right thigh.  He has some moderate swelling here and may have some hematoma.  Have ordered a stat duplex.  He has an excellent Doppler signal in the right foot.  This may potentially be hematoma related to the wire getting into collateral.  Completion arteriogram after angioplasty of the superficial femoral artery showed an excellent result with no dissection or extravasation.  We will keep him overnight.  Once his duplex is done I will probably put an Ace bandage on the thigh with some mild compression.  Cari Caraway, MD 1:33 PM

## 2021-09-09 NOTE — Progress Notes (Signed)
Right lower extremity arterial duplex completed. Refer to "CV Proc" under chart review to view preliminary results.  09/09/2021 2:13 PM Eula Fried., MHA, RVT, RDCS, RDMS

## 2021-09-09 NOTE — Progress Notes (Signed)
Report called to [place], Charity fundraiser on 4E.

## 2021-09-09 NOTE — Progress Notes (Signed)
Patient got up at 1300 to ambulate after bedrest and began to complain of severe pain in R thigh. Patient assisted back to bed. Pulses obtained by doppler and present. Don Bo, MD paged and at bedside to assess patient. MD writing orders for ultrasound and doppler. Will continue to monitor.

## 2021-09-09 NOTE — Interval H&P Note (Signed)
History and Physical Interval Note:  09/09/2021 8:48 AM  Don Fletcher  has presented today for surgery, with the diagnosis of PAD.  The various methods of treatment have been discussed with the patient and family. After consideration of risks, benefits and other options for treatment, the patient has consented to  Procedure(s): ABDOMINAL AORTOGRAM W/LOWER EXTREMITY (N/A) as a surgical intervention.  The patient's history has been reviewed, patient examined, no change in status, stable for surgery.  I have reviewed the patient's chart and labs.  Questions were answered to the patient's satisfaction.     [name]

## 2021-09-09 NOTE — Progress Notes (Signed)
Patient arrived to 4E from PACU. Vitals taken and stable. Tele placed and CCMD notified. Patient oriented to unit and staff. Groin site assessed and level 0. CHG completed. Patient left with call bell in reach.  [place] C Alesana Magistro

## 2021-09-10 DIAGNOSIS — I70211 Atherosclerosis of native arteries of extremities with intermittent claudication, right leg: Secondary | ICD-10-CM | POA: Diagnosis not present

## 2021-09-10 LAB — LIPID PANEL
Cholesterol: 219 mg/dL — ABNORMAL HIGH (ref 0–200)
HDL: 25 mg/dL — ABNORMAL LOW (ref >40)
LDL Cholesterol: 168 mg/dL — ABNORMAL HIGH (ref 0–99)
Total CHOL/HDL Ratio: 8.8 RATIO
Triglycerides: 128 mg/dL (ref <150)
VLDL: 26 mg/dL (ref 0–40)

## 2021-09-10 LAB — HIV ANTIBODY (ROUTINE TESTING W REFLEX): HIV Screen 4th Generation wRfx: NONREACTIVE

## 2021-09-10 MED ORDER — OXYCODONE-ACETAMINOPHEN 5-325 MG PO TABS: 1.0000 | ORAL_TABLET | Freq: Four times a day (QID) | ORAL | 0 refills | Status: DC | PRN

## 2021-09-10 NOTE — Progress Notes (Signed)
Received referral to assist pt with DME (RW). Met with pt and wife. He plans to return home with the support of his wife. He reports that he has a PCP, but he is going to look for another one. He reports that he has been complaining of his legs issues for 2 years and his PCP never did anything. He reports that he drives. His wife will be providing transportation until he recovers. He denies any issues buying his meds. He has a cane. Will contact Nathalie for DME referral and he agreed with agency. Contacted Jasmine with [facility] and she accepted the referral.

## 2021-09-10 NOTE — Progress Notes (Signed)
PHARMACIST LIPID MONITORING   Don Fletcher is a 51 y.o. male admitted on 09/09/2021 with PAD.  Pharmacy has been consulted to optimize lipid-lowering therapy with the indication of secondary prevention for clinical ASCVD.  Recent Labs:  Lipid Panel (last 6 months):   Lab Results  Component Value Date   CHOL 219 (H) 09/10/2021   TRIG 128 09/10/2021   HDL 25 (L) 09/10/2021   CHOLHDL 8.8 09/10/2021   VLDL 26 09/10/2021   LDLCALC 168 (H) 09/10/2021    Hepatic function panel (last 6 months):   No results found for: "AST", "ALT", "ALKPHOS", "BILITOT", "BILIDIR", "IBILI"  SCr (since admission):   Serum creatinine: 0.9 mg/dL 41/74/08 1448 Estimated creatinine clearance: 97.2 mL/min  Current therapy and lipid therapy tolerance Current lipid-lowering therapy: atorvastatin 80 mg PTA, continued at discharged Previous lipid-lowering therapies (if applicable): none Documented or reported allergies or intolerances to lipid-lowering therapies (if applicable): none  Assessment:   Patient agrees with changes to lipid-lowering therapy. Recommend adding ezetimibe 10 mg daily  Plan:    1.Statin intensity (high intensity recommended for all patients regardless of the LDL):  No statin changes. The patient is already on a high intensity statin.  2.Add ezetimibe (if any one of the following):   On a high intensity statin with LDL > 70.  3.Refer to lipid clinic:   No  4.Follow-up with:  Primary care provider - Don Deiters, MD  5.Follow-up labs after discharge:  Changes in lipid therapy were made. Check a lipid panel in 8-12 weeks then annually.      Larena Sox, PharmD PGY1 Pharmacy Resident   09/10/2021  12:03 PM

## 2021-09-10 NOTE — Progress Notes (Addendum)
  Progress Note    09/10/2021 8:15 AM 1 Day Post-Op  Subjective:  Patient is feeling some soreness in his right thigh    Vitals:   09/10/21 0757 09/10/21 0758  BP: (!) 150/100 (!) 143/82  Pulse: 69   Resp: 16   Temp: 98.1 F (36.7 C)   SpO2: 95%     Physical Exam: Lungs:  nonlabored Incisions:  L groin cath site without hematoma.  Extremities:  Minimal swelling of right thigh, ace compression bandage in place. Palpable R DP/PT pulses   CBC    Component Value Date/Time   WBC 9.4 07/23/2020 1148   RBC 5.13 07/23/2020 1148   HGB 16.7 09/09/2021 0811   HCT 49.0 09/09/2021 0811   PLT 231 07/23/2020 1148   MCV 93.6 07/23/2020 1148   MCH 31.8 07/23/2020 1148   MCHC 34.0 07/23/2020 1148   RDW 13.2 07/23/2020 1148   LYMPHSABS 1.7 05/19/2016 1900   MONOABS 0.9 05/19/2016 1900   EOSABS 0.1 05/19/2016 1900   BASOSABS 0.1 05/19/2016 1900    BMET    Component Value Date/Time   NA 138 09/09/2021 0811   K 5.6 (H) 09/09/2021 0811   CL 106 09/09/2021 0811   CO2 23 07/23/2020 1148   GLUCOSE 117 (H) 09/09/2021 0811   BUN 13 09/09/2021 0811   CREATININE 0.90 09/09/2021 0811   CALCIUM 8.9 07/23/2020 1148   GFRNONAA >60 07/23/2020 1148   GFRAA >60 10/31/2019 1515    INR    Component Value Date/Time   INR 4.40 (HH) 05/19/2016 1900     Intake/Output Summary (Last 24 hours) at 09/10/2021 0815 Last data filed at 09/10/2021 0509 Gross per 24 hour  Intake 2762.59 ml  Output 450 ml  Net 2312.59 ml     Assessment/Plan:  51 y.o. male is 1 day post op, s/p: aortogram with angioplasty of right SFA and stent of right external iliac artery    -Patient has minimal swelling of right thigh. Ace wrap in place. Duplex has ruled out hematoma or extravasation  -L groin site without hematoma -Palpable DP/PT pulses in R foot. -Will place orders for discharge home today. -Will arrange follow up with Dr.Early in 4-6 weeks in [facility] [place], [place] [facility] [phone] 09/10/2021 8:15 AM  I have interviewed the patient and examined the patient. I agree with the findings by the PA. Thigh soft. Swelling improved. Palp PT pulse on right. Left groin looks fine.   Cari Caraway, MD

## 2021-09-10 NOTE — Progress Notes (Signed)
Pt ambulated for the first time this am. Don Fletcher about 62ft and used his cane. Pt stated his right leg was painful to stand on. Would benefit from a walker to use at home. Will notify PA.

## 2021-09-12 ENCOUNTER — Encounter (HOSPITAL_COMMUNITY): Payer: Self-pay | Admitting: Vascular Surgery

## 2021-09-12 MED FILL — Heparin Sod (Porcine)-NaCl IV Soln 1000 Unit/500ML-0.9%: INTRAVENOUS | Qty: 1000 | Status: AC

## 2021-09-17 NOTE — Discharge Summary (Signed)
  Discharge Summary  Patient ID: Don Fletcher [id number] 51 y.o. 02/08/1970  Admit date: 09/09/2021  Discharge date and time: 09/10/2021  1:26 PM   Admitting Physician: Chuck Hint, MD   Discharge Physician: Chuck Hint, MD  Admission Diagnoses: PAD (peripheral artery disease) [facility]) [I73.9]  Discharge Diagnoses: PAD (peripheral artery disease) (HCC) [I73.9]  Admission Condition: fair  Discharged Condition: good  Indication for Admission: Don Fletcher is a 51 y.o. male who was seen by Dr. Tawanna Cooler Early with disabling claudication of the right lower extremity.  CT angiogram suggested an external iliac artery stenosis and a right superficial femoral artery stenosis.  He was set up for arteriography and possible intervention  Hospital Course: Patient underwent aortogram with angioplasty of right SFA and stent of right external iliac artery for disabling claudication on 09/09/2021 by Dr.Dickson. The patient experienced some right thigh pain and swelling, concerning for hematoma. He was admitted and underwent a duplex to rule out hematoma, which was negative.   On POD 1, cath site was negative for hematoma and so was duplex study. The patient was able to progress with mobility and swelling decreased with ace bandage.   Patient had palpable pulses in the right foot.  Discharged on POD 1 to follow up with Dr.Early  Consults: None  Treatments: analgesia: acetaminophen and Percocet and procedures: RLE arteriogram with angioplasty of right SFA and stent of right external iliac artery  Discharge Exam: See progress note  Vitals:   09/10/21 0758 09/10/21 1307  BP: (!) 143/82 (!) 145/85  Pulse:  78  Resp:    Temp:  97.6 F (36.4 C)  SpO2:  92%     Disposition: Discharge disposition: 01-Home or Self Care       Patient Instructions:  Allergies as of 09/10/2021       Reactions   Suboxone [buprenorphine Hcl-naloxone Hcl] Other (See Comments)   Caused kidney  failure   Benadryl [diphenhydramine] Other (See Comments)   LEG CRAMPING   Seroquel [quetiapine] Other (See Comments)   "makes my legs cramp"   Tylenol Pm Extra [diphenhydramine-apap (sleep)] Other (See Comments)   "makes my leg cramp"        Medication List     TAKE these medications    aspirin EC 81 MG tablet Take 1 tablet (81 mg total) by mouth daily. Swallow whole.   atorvastatin 80 MG tablet Commonly known as: LIPITOR TAKE 1 TABLET ONCE DAILY. What changed: when to take this   benazepril 20 MG tablet Commonly known as: LOTENSIN Take 20 mg by mouth daily.   clopidogrel 75 MG tablet Commonly known as: Plavix Take 1 tablet (75 mg total) by mouth daily.   omeprazole 40 MG capsule Commonly known as: PRILOSEC Take 40 mg by mouth daily.   oxyCODONE-acetaminophen 5-325 MG tablet Commonly known as: PERCOCET/ROXICET Take 1 tablet by mouth every 6 (six) hours as needed for moderate pain.       Activity: activity as tolerated Diet: low fat, low cholesterol diet Wound Care: keep wound clean and dry  Follow-up with Dr.Early in 4-6 weeks.  SignedErnestene Mention, PA-C 09/17/2021 6:35 PM [facility] Office: [phone]

## 2021-09-21 ENCOUNTER — Telehealth: Payer: Self-pay

## 2021-09-21 DIAGNOSIS — M25561 Pain in right knee: Secondary | ICD-10-CM

## 2021-09-21 DIAGNOSIS — I739 Peripheral vascular disease, unspecified: Secondary | ICD-10-CM

## 2021-09-21 NOTE — Telephone Encounter (Signed)
Pt and wife called stating that his R leg had a bruise on his thigh and he was in so much pain, he was having trouble walking.  Reviewed pt's chart, returned call for clarification, two identifiers used. Pt stated that approx 2-3 days ago, he noted a bruise on his R inner thigh that has grown from a dime-size to a golf ball size today. He notes throbbing, stabbing pain in his thigh with activity and rest. He has swelling in his R thigh and has been elevating. He is ambulating with a walker d/t to being unable to bear full weight on R leg.  Spoke with [place] PA, who advised pt be seen at earliest time tomorrow with a LE arterial duplex. Called pt and appts scheduled. Confirmed understanding.

## 2021-09-22 ENCOUNTER — Ambulatory Visit (HOSPITAL_COMMUNITY)
Admission: RE | Admit: 2021-09-22 | Discharge: 2021-09-22 | Disposition: A | Discharge status: Home / Self Care | Payer: Medicaid Other | Source: Ambulatory Visit | Attending: Surgery | Admitting: Surgery

## 2021-09-22 ENCOUNTER — Other Ambulatory Visit: Payer: Self-pay | Admitting: *Deleted

## 2021-09-22 ENCOUNTER — Ambulatory Visit (INDEPENDENT_AMBULATORY_CARE_PROVIDER_SITE_OTHER): Payer: Medicaid Other | Admitting: Physician Assistant

## 2021-09-22 VITALS — BP 129/94 | HR 89 | Temp 98.1°F | Resp 20 | Ht 66.0 in | Wt 172.0 lb

## 2021-09-22 DIAGNOSIS — M25561 Pain in right knee: Secondary | ICD-10-CM

## 2021-09-22 DIAGNOSIS — I739 Peripheral vascular disease, unspecified: Secondary | ICD-10-CM

## 2021-09-22 NOTE — Progress Notes (Signed)
[facility] [place] HISTORY AND PHYSICAL   History of Present Illness:  Patient is a 51 y.o. year old male who presents for evaluation of claudication.  CT angiogram suggested an external iliac artery stenosis and a right superficial femoral artery stenosis.   He is s/p Angioplasty and placement of a drug-eluting stent  the right external iliac artery and right SFA via left femoral artery access.    He states he no longer has right calf pain with ambulation.  He has a new anterior thigh bruise that surfaced 3 days post angiogram.  He denise trama to the thigh.    He is medically managed on ASA, Plavix and a Stain daily.  He is an every day smoker and was advised to quit.       Past Medical History:  Diagnosis Date   Bipolar 1 disorder (HCC)    CAD (coronary artery disease)    a. 12/16/13 inferolat STEMI s/p BMS to RPL. b. Cath 08/2014: interim occlusion of the PLA stent of the RCA, collateralized from the left coronary artery with diffuse nonobstructive CAD;  c. STEMI/PCI: LM nl, LAD nl, LCX 140m/d (2.75x12 synergy DES), RCA 30p, RPDA 75ost/p, RPAV 100 w/ L->R collats.   Chronic back pain    HLD (hyperlipidemia)    Hypertension    Ischemic cardiomyopathy    a. 12/2013: EF 50-55% by echo with +WMA. b. EF 45% by cath in 08/2014.   Ischemic cardiomyopathy 05/19/2016   Peptic ulcer disease    Schizophrenic disorder (HCC)    Tobacco abuse     Past Surgical History:  Procedure Laterality Date   ABDOMINAL AORTOGRAM W/LOWER EXTREMITY Bilateral 09/09/2021   Procedure: ABDOMINAL AORTOGRAM W/LOWER EXTREMITY;  Surgeon: Chuck Hint, MD;  Location: [facility] INVASIVE CV LAB;  Service: Cardiovascular;  Laterality: Bilateral;   CARDIAC CATHETERIZATION N/A 08/07/2014   Procedure: Left Heart Cath and Coronary Angiography;  Surgeon: Tonny Bollman, MD;  Location: [facility] INVASIVE CV LAB;  Service: Cardiovascular;  Laterality: N/A;   COLONOSCOPY N/A 06/06/2017   Procedure: COLONOSCOPY;   Surgeon: Malissa Hippo, MD;  Location: [facility] ENDO SUITE;  Service: Endoscopy;  Laterality: N/A;  10:50   CORONARY STENT INTERVENTION N/A 05/19/2016   Procedure: Coronary Stent Intervention;  Surgeon: Runell Gess, MD;  Location: [facility] INVASIVE CV LAB;  Service: Cardiovascular;  Laterality: N/A;   CORONARY STENT INTERVENTION N/A 07/02/2018   Procedure: CORONARY STENT INTERVENTION;  Surgeon: Lyn Records, MD;  Location: [facility] INVASIVE CV LAB;  Service: Cardiovascular;  Laterality: N/A;   HERNIA REPAIR     LEFT HEART CATH AND CORONARY ANGIOGRAPHY N/A 05/19/2016   Procedure: Left Heart Cath and Coronary Angiography;  Surgeon: Runell Gess, MD;  Location: [facility] INVASIVE CV LAB;  Service: Cardiovascular;  Laterality: N/A;   LEFT HEART CATH AND CORONARY ANGIOGRAPHY N/A 07/02/2018   Procedure: LEFT HEART CATH AND CORONARY ANGIOGRAPHY;  Surgeon: Lyn Records, MD;  Location: [facility] INVASIVE CV LAB;  Service: Cardiovascular;  Laterality: N/A;   LEFT HEART CATHETERIZATION WITH CORONARY ANGIOGRAM N/A 12/16/2013   Procedure: LEFT HEART CATHETERIZATION WITH CORONARY ANGIOGRAM;  Surgeon: Corky Crafts, MD;  Location: [facility] CATH LAB;  Service: Cardiovascular;  Laterality: N/A;   PERCUTANEOUS CORONARY STENT INTERVENTION (PCI-S)  12/16/2013   Procedure: PERCUTANEOUS CORONARY STENT INTERVENTION (PCI-S);  Surgeon: Corky Crafts, MD;  Location: [facility] CATH LAB;  Service: Cardiovascular;;  distal rca   PERIPHERAL VASCULAR BALLOON ANGIOPLASTY  09/09/2021   Procedure: PERIPHERAL VASCULAR  BALLOON ANGIOPLASTY;  Surgeon: Chuck Hint, MD;  Location: [facility] INVASIVE CV LAB;  Service: Cardiovascular;;  Rt SFA   PERIPHERAL VASCULAR INTERVENTION  09/09/2021   Procedure: PERIPHERAL VASCULAR INTERVENTION;  Surgeon: Chuck Hint, MD;  Location: [facility] INVASIVE CV LAB;  Service: Cardiovascular;;  Rt Iliac    ROS:   General:  No weight loss, Fever, chills  HEENT: No recent headaches, no nasal bleeding, no visual changes,  no sore throat  Neurologic: No dizziness, blackouts, seizures. No recent symptoms of stroke or mini- stroke. No recent episodes of slurred speech, or temporary blindness.  Cardiac: No recent episodes of chest pain/pressure, no shortness of breath at rest.  No shortness of breath with exertion.  Denies history of atrial fibrillation or irregular heartbeat  Vascular: No history of rest pain in feet.  No history of claudication.  No history of non-healing ulcer, No history of DVT   Pulmonary: No home oxygen, no productive cough, no hemoptysis,  No asthma or wheezing  Musculoskeletal:  [ ]  Arthritis, [ ]  Low back pain,  [ ]  Joint pain  Hematologic:No history of hypercoagulable state.  No history of easy bleeding.  No history of anemia  Gastrointestinal: No hematochezia or melena,  No gastroesophageal reflux, no trouble swallowing  Urinary: [ ]  chronic Kidney disease, [ ]  on HD - [ ]  MWF or [ ]  TTHS, [ ]  Burning with urination, [ ]  Frequent urination, [ ]  Difficulty urinating;   Skin: No rashes  Psychological: No history of anxiety,  No history of depression  Social History Social History   Tobacco Use   Smoking status: Every Day    Packs/day: 1.00    Years: 25.00    Total pack years: 25.00    Types: Cigarettes, Cigars    Start date: 05/25/1988   Smokeless tobacco: Former    Types: Snuff    Quit date: 05/21/1988  Vaping Use   Vaping Use: Never used  Substance Use Topics   Alcohol use: No    Alcohol/week: 0.0 standard drinks of alcohol   Drug use: Yes    Types: Marijuana    Comment: last used today    Family History Family History  Problem Relation Age of Onset   Heart attack Father 40   Heart attack Brother 40    Allergies  Allergies  Allergen Reactions   Suboxone [Buprenorphine Hcl-Naloxone Hcl] Other (See Comments)    Caused kidney failure   Benadryl [Diphenhydramine] Other (See Comments)    LEG CRAMPING   Seroquel [Quetiapine] Other (See Comments)    "makes my  legs cramp"   Tylenol Pm Extra [Diphenhydramine-Apap (Sleep)] Other (See Comments)    "makes my leg cramp"     Current Outpatient Medications  Medication Sig Dispense Refill   aspirin EC 81 MG tablet Take 1 tablet (81 mg total) by mouth daily. Swallow whole. 150 tablet 2   atorvastatin (LIPITOR) 80 MG tablet TAKE 1 TABLET ONCE DAILY. (Patient taking differently: Take 80 mg by mouth at bedtime.) 30 tablet 6   benazepril (LOTENSIN) 20 MG tablet Take 20 mg by mouth daily.     clopidogrel (PLAVIX) 75 MG tablet Take 1 tablet (75 mg total) by mouth daily. 30 tablet 11   omeprazole (PRILOSEC) 40 MG capsule Take 40 mg by mouth daily.     oxyCODONE-acetaminophen (PERCOCET/ROXICET) 5-325 MG tablet Take 1 tablet by mouth every 6 (six) hours as needed for moderate pain. (Patient not taking: Reported on 09/22/2021) 10 tablet 0  No current facility-administered medications for this visit.    Physical Examination  Vitals:   09/22/21 0848  BP: (!) 129/94  Pulse: 89  Resp: 20  Temp: 98.1 F (36.7 C)  SpO2: 97%  Weight: 172 lb (78 kg)  Height: 5\' 6"  (1.676 m)    Body mass index is 27.76 kg/m.  General:  Alert and oriented, no acute distress HEENT: Normal Neck: No bruit or JVD Pulmonary: Clear to auscultation bilaterally Cardiac: Regular Rate and Rhythm without murmur Abdomen: Soft, non-tender, non-distended, no mass, no scars Skin: No rash.  Anterior thigh bruise without edema, no frank hematoma.  Compartments are soft. Extremity Pulses:  2+ radial, femoral,  posterior tibial pulses bilaterally Musculoskeletal: No deformity or edema  Neurologic: Upper and lower extremity motor 5/5 and symmetric  DATA:    +-----------+--------+-----+--------+---------+--------+  RIGHT      PSV cm/sRatioStenosisWaveform Comments  +-----------+--------+-----+--------+---------+--------+  EIA Distal 117                  triphasic           +-----------+--------+-----+--------+---------+--------+  CFA Prox   77                   triphasic          +-----------+--------+-----+--------+---------+--------+  CFA Distal 116                  triphasic          +-----------+--------+-----+--------+---------+--------+  DFA        106                  triphasic          +-----------+--------+-----+--------+---------+--------+  SFA Prox   122                  triphasic          +-----------+--------+-----+--------+---------+--------+  SFA Mid    110                  triphasic          +-----------+--------+-----+--------+---------+--------+  SFA Distal 97                   triphasic          +-----------+--------+-----+--------+---------+--------+  POP Prox   70                   biphasic           +-----------+--------+-----+--------+---------+--------+  POP Mid    66                   biphasic           +-----------+--------+-----+--------+---------+--------+  POP Distal 52                   biphasic           +-----------+--------+-----+--------+---------+--------+  ATA Distal 23                   biphasic           +-----------+--------+-----+--------+---------+--------+  PTA Distal 67                   biphasic           +-----------+--------+-----+--------+---------+--------+  PERO Distal67                   biphasic           +-----------+--------+-----+--------+---------+--------+  Right Stent(s): EIA stent  +--------------+---++---------++  Prox to Stent 117biphasic   +--------------+---++---------++  Proximal Stent97 triphasic  +--------------+---++---------++  Mid Stent     125biphasic   +--------------+---++---------++  Distal Stent  89 biphasic   +--------------+---++---------++       Summary:  Right: Patent stent with no evidence of stenosis in the EIA artery.  Patent right lower extremity  with no evidence of stenosis.  No pseudoaneurysm visualized.     ASSESSMENT/PLAN:  PAD with right LE claudication history requiring endovascular intervention by Dr. Edilia Bo.  s/p Angioplasty and placement of a drug-eluting stent  the right external iliac artery and right SFA via left femoral artery access.    Ice/heat to ecchymosis anterior right thigh.  Increase a walking program as tolerates and smoking cessation.   He will f/u in 6 months for right LE duplex and ABI's.  He has palpable PT pulses B and his right LE claudication symptoms have gone away.    Cont. Maximum medically management with ASA, Plavix and Lipitor.      Mosetta Pigeon PA-C [facility] [place] Office: [phone]  MD in clinic [place]

## 2021-09-27 ENCOUNTER — Other Ambulatory Visit: Payer: Self-pay

## 2021-09-27 DIAGNOSIS — I739 Peripheral vascular disease, unspecified: Secondary | ICD-10-CM

## 2021-09-29 ENCOUNTER — Ambulatory Visit: Payer: Medicaid Other | Admitting: Internal Medicine

## 2021-09-29 ENCOUNTER — Encounter: Payer: Self-pay | Admitting: Internal Medicine

## 2021-09-29 VITALS — BP 123/81 | HR 90 | Ht 68.0 in | Wt 172.6 lb

## 2021-09-29 DIAGNOSIS — E785 Hyperlipidemia, unspecified: Secondary | ICD-10-CM

## 2021-09-29 DIAGNOSIS — I2510 Atherosclerotic heart disease of native coronary artery without angina pectoris: Secondary | ICD-10-CM

## 2021-09-29 DIAGNOSIS — I255 Ischemic cardiomyopathy: Secondary | ICD-10-CM | POA: Diagnosis not present

## 2021-09-29 DIAGNOSIS — Z0000 Encounter for general adult medical examination without abnormal findings: Secondary | ICD-10-CM | POA: Diagnosis not present

## 2021-09-29 DIAGNOSIS — Z9861 Coronary angioplasty status: Secondary | ICD-10-CM

## 2021-09-29 DIAGNOSIS — Z1331 Encounter for screening for depression: Secondary | ICD-10-CM | POA: Insufficient documentation

## 2021-09-29 DIAGNOSIS — Z720 Tobacco use: Secondary | ICD-10-CM

## 2021-09-29 DIAGNOSIS — I739 Peripheral vascular disease, unspecified: Secondary | ICD-10-CM

## 2021-09-29 DIAGNOSIS — G4700 Insomnia, unspecified: Secondary | ICD-10-CM | POA: Insufficient documentation

## 2021-09-29 DIAGNOSIS — I10 Essential (primary) hypertension: Secondary | ICD-10-CM | POA: Diagnosis not present

## 2021-09-29 DIAGNOSIS — Z0001 Encounter for general adult medical examination with abnormal findings: Secondary | ICD-10-CM

## 2021-09-29 MED ORDER — CARVEDILOL 6.25 MG PO TABS: 6.2500 mg | ORAL_TABLET | Freq: Two times a day (BID) | ORAL | 3 refills | Status: DC

## 2021-09-29 NOTE — Assessment & Plan Note (Signed)
Currently taking atorvastatin 80 mg daily

## 2021-09-29 NOTE — Assessment & Plan Note (Signed)
PHQ-9 score elevated today.  Patient reports a history of depression and states that prior to getting married he had multiple suicide attempts.  He is interested in counseling and speaking with a mental health professional.  I have placed a new referral to psychiatry today.

## 2021-09-29 NOTE — Progress Notes (Signed)
New Patient Office Visit  Subjective    Patient ID: Don Fletcher, male    DOB: [birth year]  Age: 51 y.o. MRN: [id number]  CC:  Chief Complaint  Patient presents with   Establish Care    Sx 3wk ago and noticed yellowish bruise on right leg and has a pulling feeling when stretching   HPI Don Fletcher presents to establish care.  He is a 51 year old male with a past medical history significant for CAD s/p multiple PCI's, PAD, ischemic cardiomyopathy, HLD, HTN, BPD 1, PUD, schizophrenic disorder, and tobacco use.  He was previously followed by Dr. Olena Leatherwood in [place], [place].  Don Fletcher is accompanied by his wife, Don Fletcher today.  His acute concerns are a right thigh bruise following recent angioplasty and placement of DES to the right external iliac artery and right SFA.  The bruise has turned yellow, which concerned him.  He also endorses recent insomnia and ask about medication to help him sleep.  Acute concerns and chronic medical issues discussed today individually addressed in A/P below.  Outpatient Encounter Medications as of 09/29/2021  Medication Sig   aspirin EC 81 MG tablet Take 1 tablet (81 mg total) by mouth daily. Swallow whole.   atorvastatin (LIPITOR) 80 MG tablet TAKE 1 TABLET ONCE DAILY. (Patient taking differently: Take 80 mg by mouth at bedtime.)   benazepril (LOTENSIN) 20 MG tablet Take 20 mg by mouth daily.   carvedilol (COREG) 6.25 MG tablet Take 1 tablet (6.25 mg total) by mouth 2 (two) times daily with a meal.   clopidogrel (PLAVIX) 75 MG tablet Take 1 tablet (75 mg total) by mouth daily.   omeprazole (PRILOSEC) 40 MG capsule Take 40 mg by mouth daily.   [DISCONTINUED] aspirin EC 81 MG tablet Take 1 tablet by mouth daily.   [DISCONTINUED] benazepril (LOTENSIN) 20 MG tablet Take 1 tablet by mouth daily.   [DISCONTINUED] oxyCODONE-acetaminophen (PERCOCET/ROXICET) 5-325 MG tablet Take 1 tablet by mouth every 6 (six) hours as needed for moderate pain. (Patient not taking: Reported  on 09/22/2021)   No facility-administered encounter medications on file as of 09/29/2021.    Past Medical History:  Diagnosis Date   Bipolar 1 disorder (HCC)    CAD (coronary artery disease)    a. 12/16/13 inferolat STEMI s/p BMS to RPL. b. Cath 08/2014: interim occlusion of the PLA stent of the RCA, collateralized from the left coronary artery with diffuse nonobstructive CAD;  c. STEMI/PCI: LM nl, LAD nl, LCX 14m/d (2.75x12 synergy DES), RCA 30p, RPDA 75ost/p, RPAV 100 w/ L->R collats.   Chronic back pain    HLD (hyperlipidemia)    Hypertension    Ischemic cardiomyopathy    a. 12/2013: EF 50-55% by echo with +WMA. b. EF 45% by cath in 08/2014.   Ischemic cardiomyopathy 05/19/2016   Peptic ulcer disease    Schizophrenic disorder (HCC)    Tobacco abuse     Past Surgical History:  Procedure Laterality Date   ABDOMINAL AORTOGRAM W/LOWER EXTREMITY Bilateral 09/09/2021   Procedure: ABDOMINAL AORTOGRAM W/LOWER EXTREMITY;  Surgeon: Chuck Hint, MD;  Location: [facility] INVASIVE CV LAB;  Service: Cardiovascular;  Laterality: Bilateral;   CARDIAC CATHETERIZATION N/A 08/07/2014   Procedure: Left Heart Cath and Coronary Angiography;  Surgeon: Tonny Bollman, MD;  Location: [facility] INVASIVE CV LAB;  Service: Cardiovascular;  Laterality: N/A;   COLONOSCOPY N/A 06/06/2017   Procedure: COLONOSCOPY;  Surgeon: Malissa Hippo, MD;  Location: [facility] ENDO SUITE;  Service: Endoscopy;  Laterality:  N/A;  10:50   CORONARY STENT INTERVENTION N/A 05/19/2016   Procedure: Coronary Stent Intervention;  Surgeon: Runell Gess, MD;  Location: [facility] INVASIVE CV LAB;  Service: Cardiovascular;  Laterality: N/A;   CORONARY STENT INTERVENTION N/A 07/02/2018   Procedure: CORONARY STENT INTERVENTION;  Surgeon: Lyn Records, MD;  Location: [facility] INVASIVE CV LAB;  Service: Cardiovascular;  Laterality: N/A;   HERNIA REPAIR     LEFT HEART CATH AND CORONARY ANGIOGRAPHY N/A 05/19/2016   Procedure: Left Heart Cath and Coronary Angiography;   Surgeon: Runell Gess, MD;  Location: [facility] INVASIVE CV LAB;  Service: Cardiovascular;  Laterality: N/A;   LEFT HEART CATH AND CORONARY ANGIOGRAPHY N/A 07/02/2018   Procedure: LEFT HEART CATH AND CORONARY ANGIOGRAPHY;  Surgeon: Lyn Records, MD;  Location: [facility] INVASIVE CV LAB;  Service: Cardiovascular;  Laterality: N/A;   LEFT HEART CATHETERIZATION WITH CORONARY ANGIOGRAM N/A 12/16/2013   Procedure: LEFT HEART CATHETERIZATION WITH CORONARY ANGIOGRAM;  Surgeon: Corky Crafts, MD;  Location: [facility] CATH LAB;  Service: Cardiovascular;  Laterality: N/A;   PERCUTANEOUS CORONARY STENT INTERVENTION (PCI-S)  12/16/2013   Procedure: PERCUTANEOUS CORONARY STENT INTERVENTION (PCI-S);  Surgeon: Corky Crafts, MD;  Location: [facility] CATH LAB;  Service: Cardiovascular;;  distal rca   PERIPHERAL VASCULAR BALLOON ANGIOPLASTY  09/09/2021   Procedure: PERIPHERAL VASCULAR BALLOON ANGIOPLASTY;  Surgeon: Chuck Hint, MD;  Location: [facility] INVASIVE CV LAB;  Service: Cardiovascular;;  Rt SFA   PERIPHERAL VASCULAR INTERVENTION  09/09/2021   Procedure: PERIPHERAL VASCULAR INTERVENTION;  Surgeon: Chuck Hint, MD;  Location: [facility] INVASIVE CV LAB;  Service: Cardiovascular;;  Rt Iliac    Family History  Problem Relation Age of Onset   Heart attack Father 69   Heart attack Brother 35    Social History   Socioeconomic History   Marital status: Married    Spouse name: Not on file   Number of children: Not on file   Years of education: Not on file   Highest education level: Not on file  Occupational History   Not on file  Tobacco Use   Smoking status: Every Day    Packs/day: 1.00    Years: 25.00    Total pack years: 25.00    Types: Cigarettes, Cigars    Start date: 05/25/1988   Smokeless tobacco: Former    Types: Snuff    Quit date: 05/21/1988  Vaping Use   Vaping Use: Never used  Substance and Sexual Activity   Alcohol use: No    Alcohol/week: 0.0 standard drinks of alcohol   Drug use: Yes     Types: Marijuana    Comment: last used today   Sexual activity: Not on file  Other Topics Concern   Not on file  Social History Narrative   Not on file   Social Determinants of Health   Financial Resource Strain: Not on file  Food Insecurity: Not on file  Transportation Needs: Not on file  Physical Activity: Inactive (06/30/2018)   Exercise Vital Sign    Days of Exercise per Week: 0 days    Minutes of Exercise per Session: 0 min  Stress: Stress Concern Present (06/30/2018)   [name]    Feeling of Stress : Rather much  Social Connections: Somewhat Isolated (06/30/2018)   Social Connection and Isolation Panel [NHANES]    Frequency of Communication with Friends and Family: More than three times a week    Frequency of Social  Gatherings with Friends and Family: Never    Attends Religious Services: Never    Database administrator or Organizations: No    Attends Banker Meetings: Never    Marital Status: Married  Catering manager Violence: Not At Risk (06/30/2018)   Humiliation, Afraid, Rape, and Kick questionnaire    Fear of Current or Ex-Partner: No    Emotionally Abused: No    Physically Abused: No    Sexually Abused: No    Review of Systems  Constitutional:  Negative for chills and fever.       Insomnia  HENT:  Negative for sore throat.   Respiratory:  Negative for cough and shortness of breath.   Cardiovascular:  Negative for chest pain, palpitations and leg swelling.  Gastrointestinal:  Negative for abdominal pain, blood in stool, constipation, diarrhea, nausea and vomiting.  Genitourinary:  Negative for dysuria and hematuria.  Musculoskeletal:  Negative for myalgias.  Skin:  Negative for itching and rash.       Bruise on right thigh  Neurological:  Negative for dizziness and headaches.  Psychiatric/Behavioral:  Negative for depression and suicidal ideas.     Objective    BP 123/81    Pulse 90   Ht 5\' 8"  (1.727 m)   Wt 172 lb 9.6 oz (78.3 kg)   SpO2 97%   BMI 26.24 kg/m   Physical Exam Vitals reviewed.  Constitutional:      General: He is not in acute distress.    Appearance: He is ill-appearing.     Comments: Appears older than stated age  HENT:     Head: Normocephalic and atraumatic.     Right Ear: Tympanic membrane, ear canal and external ear normal.     Left Ear: Tympanic membrane, ear canal and external ear normal.     Nose: Nose normal. No congestion or rhinorrhea.     Mouth/Throat:     Mouth: Mucous membranes are moist.     Pharynx: Oropharynx is clear.  Eyes:     General: No scleral icterus.    Extraocular Movements: Extraocular movements intact.     Conjunctiva/sclera: Conjunctivae normal.     Pupils: Pupils are equal, round, and reactive to light.  Cardiovascular:     Rate and Rhythm: Normal rate and regular rhythm.     Pulses: Normal pulses.     Heart sounds: Normal heart sounds.     Comments: 3/6 systolic murmur present on exam Pulmonary:     Effort: Pulmonary effort is normal.     Breath sounds: Normal breath sounds. No wheezing, rhonchi or rales.  Abdominal:     General: Abdomen is flat. Bowel sounds are normal. There is no distension.     Palpations: Abdomen is soft.     Tenderness: There is no abdominal tenderness.  Musculoskeletal:        General: No swelling or deformity. Normal range of motion.     Cervical back: Normal range of motion.     Right lower leg: No edema.     Left lower leg: No edema.  Skin:    General: Skin is warm and dry.     Capillary Refill: Capillary refill takes less than 2 seconds.     Findings: Bruising present.     Comments: There is a large yellow discolored bruise on the right thigh  Neurological:     General: No focal deficit present.     Mental Status: He is alert and oriented to person,  place, and time.     Motor: No weakness.     Gait: Gait abnormal.     Comments: Ambulates with antalgic gait   Psychiatric:        Mood and Affect: Mood normal.        Behavior: Behavior normal.        Thought Content: Thought content normal.     Last CBC Lab Results  Component Value Date   WBC 9.4 07/23/2020   HGB 16.7 09/09/2021   HCT 49.0 09/09/2021   MCV 93.6 07/23/2020   MCH 31.8 07/23/2020   RDW 13.2 07/23/2020   PLT 231 07/23/2020   Last metabolic panel Lab Results  Component Value Date   GLUCOSE 117 (H) 09/09/2021   NA 138 09/09/2021   K 5.6 (H) 09/09/2021   CL 106 09/09/2021   CO2 23 07/23/2020   BUN 13 09/09/2021   CREATININE 0.90 09/09/2021   GFRNONAA >60 07/23/2020   CALCIUM 8.9 07/23/2020   PROT 7.0 07/23/2020   ALBUMIN 3.8 07/23/2020   BILITOT 0.5 07/23/2020   ALKPHOS 81 07/23/2020   AST 24 07/23/2020   ALT 28 07/23/2020   ANIONGAP 8 07/23/2020   Last lipids Lab Results  Component Value Date   CHOL 219 (H) 09/10/2021   HDL 25 (L) 09/10/2021   LDLCALC 168 (H) 09/10/2021   TRIG 128 09/10/2021   CHOLHDL 8.8 09/10/2021   Last hemoglobin A1c Lab Results  Component Value Date   HGBA1C 5.4 05/19/2016   Last thyroid functions Lab Results  Component Value Date   TSH 2.779 05/19/2016     Assessment & Plan:   Problem List Items Addressed This Visit       Cardiovascular and Mediastinum   CAD S/P PCI:  2.0 x 23 vision bare metal stent (2.25 mm) RPL - Primary    Significant cardiac history notable for inferolateral STEMI 2015 and multiple PCI's.  He is currently prescribed ASA, Plavix, statin, and ACE inhibitor.  He was also prescribed carvedilol until earlier this month, it appears that the prescription was inadvertently discontinued prior to hospitalization for right lower extremity angioplasty. -Continue ASA/Plavix, statin, and benazepril -Restart carvedilol today as I do not see any contraindication to resuming this medication and cannot find a documented reason as to why was discontinued -I have also placed in a referral to cardiology for the patient  to reestablish care given his significant cardiac history      Relevant Medications   carvedilol (COREG) 6.25 MG tablet   Other Relevant Orders   Ambulatory referral to Cardiology   HgB A1c   Hypertension    BP 123/81 today.  He states that this is likely because he took an extra dose of benazepril this morning as his readings are typically higher. -Restarting carvedilol today as above      Relevant Medications   carvedilol (COREG) 6.25 MG tablet   Other Relevant Orders   Ambulatory referral to Cardiology         PAD (peripheral artery disease) (HCC)    S/p recent angioplasty with DES to right external iliac artery and right SFA.  He has a bruise on the anterior portion of his right thigh that is yellow in color.  I explained to him that this represents healing of the bruise as the blood products are being metabolized.  He has follow-up with vascular surgery planned for next week.      Relevant Medications   carvedilol (COREG) 6.25 MG tablet  Other   Tobacco abuse    He states he has been using nicotine products since the age of 5 and continues to smoke despite being counseled by numerous healthcare providers on the importance of cessation.  He states that it is just hard to quit.  He declines NRT today.  I again counseled him on the imperative nature for complete smoking cessation given his chronic medical issues at a relatively young age.      HLD (hyperlipidemia)    Currently taking atorvastatin 80 mg daily      Relevant Medications   carvedilol (COREG) 6.25 MG tablet   Positive screening for depression on 9-item Patient Health Questionnaire (PHQ-9)    PHQ-9 score elevated today.  Patient reports a history of depression and states that prior to getting married he had multiple suicide attempts.  He is interested in counseling and speaking with a mental health professional.  I have placed a new referral to psychiatry today.      Relevant Orders   Ambulatory referral to  Psychiatry   Preventative health care    -Baseline labs ordered today, including one-time screening for HCV -Outstanding vaccinations declined      Relevant Orders   HgB A1c   B12 and Folate Panel   Fe+TIBC+Fer   TSH + free T4   HCV Ab w Reflex to Quant PCR   Insomnia    He reports a recent history of intermittent insomnia, with difficulty both falling and staying asleep.  We discussed proper sleep hygiene techniques and I provided him with an educational handout.  I also discussed the use of melatonin now to properly take melatonin several hours prior to going to sleep in order for her to have the greatest chance of effect.      Return in about 6 weeks (around 11/10/2021).   Billie Lade, MD

## 2021-09-29 NOTE — Assessment & Plan Note (Addendum)
S/p recent angioplasty with DES to right external iliac artery and right SFA.  He has a bruise on the anterior portion of his right thigh that is yellow in color.  I explained to him that this represents healing of the bruise as the blood products are being metabolized.  He has follow-up with vascular surgery planned for next week.

## 2021-09-29 NOTE — Patient Instructions (Addendum)
It was a pleasure to see you today.  Thank you for giving [place] the opportunity to be involved in your care.  Below is a brief recap of your visit and next steps.  We will plan to see you again in 6 weeks.  Summary I have restarted carvedilol 6.25 mg twice daily I strongly encourage you to stop smoking I have also placed referrals to cardiology and psychiatry  Next steps We will arrange follow up for 6 weeks Please let us know if you do not hear from cardiology about scheduling an appointment soon

## 2021-09-29 NOTE — Assessment & Plan Note (Signed)
-  Baseline labs ordered today, including one-time screening for HCV -Outstanding vaccinations declined

## 2021-09-29 NOTE — Assessment & Plan Note (Addendum)
Significant cardiac history notable for inferolateral STEMI 2015 and multiple PCI's.  He is currently prescribed ASA, Plavix, statin, and ACE inhibitor.  He was also prescribed carvedilol until earlier this month, it appears that the prescription was inadvertently discontinued prior to hospitalization for right lower extremity angioplasty. -Continue ASA/Plavix, statin, and benazepril -Restart carvedilol today as I do not see any contraindication to resuming this medication and cannot find a documented reason as to why was discontinued -I have also placed in a referral to cardiology for the patient to reestablish care given his significant cardiac history

## 2021-09-29 NOTE — Assessment & Plan Note (Signed)
He states he has been using nicotine products since the age of 74 and continues to smoke despite being counseled by numerous healthcare providers on the importance of cessation.  He states that it is just hard to quit.  He declines NRT today.  I again counseled him on the imperative nature for complete smoking cessation given his chronic medical issues at a relatively young age.

## 2021-09-29 NOTE — Assessment & Plan Note (Signed)
BP 123/81 today.  He states that this is likely because he took an extra dose of benazepril this morning as his readings are typically higher. -Restarting carvedilol today as above

## 2021-09-29 NOTE — Assessment & Plan Note (Signed)
He reports a recent history of intermittent insomnia, with difficulty both falling and staying asleep.  We discussed proper sleep hygiene techniques and I provided him with an educational handout.  I also discussed the use of melatonin now to properly take melatonin several hours prior to going to sleep in order for her to have the greatest chance of effect.

## 2021-10-01 LAB — TSH+FREE T4
Free T4: 1.23 ng/dL (ref 0.82–1.77)
TSH: 1.31 u[IU]/mL (ref 0.450–4.500)

## 2021-10-01 LAB — IRON,TIBC AND FERRITIN PANEL
Ferritin: 184 ng/mL (ref 30–400)
Iron Saturation: 25 % (ref 15–55)
Iron: 82 ug/dL (ref 38–169)
Total Iron Binding Capacity: 328 ug/dL (ref 250–450)
UIBC: 246 ug/dL (ref 111–343)

## 2021-10-01 LAB — B12 AND FOLATE PANEL
Folate: 2.5 ng/mL — ABNORMAL LOW (ref >3.0)
Vitamin B-12: 689 pg/mL (ref 232–1245)

## 2021-10-01 LAB — HCV INTERPRETATION

## 2021-10-01 LAB — HCV AB W REFLEX TO QUANT PCR: HCV Ab: NONREACTIVE

## 2021-10-01 LAB — HEMOGLOBIN A1C
Est. average glucose Bld gHb Est-mCnc: 114 mg/dL
Hgb A1c MFr Bld: 5.6 % (ref 4.8–5.6)

## 2021-10-05 ENCOUNTER — Ambulatory Visit (INDEPENDENT_AMBULATORY_CARE_PROVIDER_SITE_OTHER): Payer: Medicaid Other | Admitting: Vascular Surgery

## 2021-10-05 ENCOUNTER — Encounter: Payer: Self-pay | Admitting: Vascular Surgery

## 2021-10-05 VITALS — BP 156/96 | HR 68 | Temp 98.1°F | Ht 67.0 in | Wt 172.4 lb

## 2021-10-05 DIAGNOSIS — I739 Peripheral vascular disease, unspecified: Secondary | ICD-10-CM | POA: Diagnosis not present

## 2021-10-05 NOTE — Progress Notes (Signed)
[facility] [place]  Patient name: Don Fletcher MRN: [id number] DOB: [birth year] Sex: male  REASON FOR VISIT: Follow-up recent right external iliac and right superficial femoral artery angioplasty and stenting of the left femoral approach with Dr. Edilia Bo on 09/09/2021  HPI: Don Fletcher is a 51 y.o. male here today for follow-up.  He is here today with his wife.  He reports continued discomfort in his right thigh.  Also interesting the now is reporting discomfort with walking in his left calf.  Did have some bruising in his right thigh post procedurally.  This is resolving.  He reports that he is seen Dr. Annell Greening with orthopedics and apparently is considering hip replacements bilaterally.  Past Medical History:  Diagnosis Date   Bipolar 1 disorder (HCC)    CAD (coronary artery disease)    a. 12/16/13 inferolat STEMI s/p BMS to RPL. b. Cath 08/2014: interim occlusion of the PLA stent of the RCA, collateralized from the left coronary artery with diffuse nonobstructive CAD;  c. STEMI/PCI: LM nl, LAD nl, LCX 158m/d (2.75x12 synergy DES), RCA 30p, RPDA 75ost/p, RPAV 100 w/ L->R collats.   Chronic back pain    HLD (hyperlipidemia)    Hypertension    Ischemic cardiomyopathy    a. 12/2013: EF 50-55% by echo with +WMA. b. EF 45% by cath in 08/2014.   Ischemic cardiomyopathy 05/19/2016   Peptic ulcer disease    Schizophrenic disorder (HCC)    Tobacco abuse     Family History  Problem Relation Age of Onset   Heart attack Father 81   Heart attack Brother 99    SOCIAL HISTORY: Social History   Tobacco Use   Smoking status: Every Day    Packs/day: 1.00    Years: 25.00    Total pack years: 25.00    Types: Cigarettes, Cigars    Start date: 05/25/1988   Smokeless tobacco: Former    Types: Snuff    Quit date: 05/21/1988  Substance Use Topics   Alcohol use: No    Alcohol/week: 0.0 standard drinks of alcohol    Allergies   Allergen Reactions   Suboxone [Buprenorphine Hcl-Naloxone Hcl] Other (See Comments)    Caused kidney failure   Benadryl [Diphenhydramine] Other (See Comments)    LEG CRAMPING   Seroquel [Quetiapine] Other (See Comments)    "makes my legs cramp"   Tylenol Pm Extra [Diphenhydramine-Apap (Sleep)] Other (See Comments)    "makes my leg cramp"    Current Outpatient Medications  Medication Sig Dispense Refill   aspirin EC 81 MG tablet Take 1 tablet (81 mg total) by mouth daily. Swallow whole. 150 tablet 2   atorvastatin (LIPITOR) 80 MG tablet TAKE 1 TABLET ONCE DAILY. (Patient taking differently: Take 80 mg by mouth at bedtime.) 30 tablet 6   benazepril (LOTENSIN) 20 MG tablet Take 20 mg by mouth daily.     carvedilol (COREG) 6.25 MG tablet Take 1 tablet (6.25 mg total) by mouth 2 (two) times daily with a meal. 60 tablet 3   clopidogrel (PLAVIX) 75 MG tablet Take 1 tablet (75 mg total) by mouth daily. 30 tablet 11   omeprazole (PRILOSEC) 40 MG capsule Take 40 mg by mouth daily.     No current facility-administered medications for this visit.    REVIEW OF SYSTEMS:  [X]  denotes positive finding, [ ]  denotes negative finding Cardiac  Comments:  Chest pain or chest pressure:    Shortness of breath upon  exertion:    Short of breath when lying flat:    Irregular heart rhythm:        Vascular    Pain in calf, thigh, or hip brought on by ambulation: x   Pain in feet at night that wakes you up from your sleep:     Blood clot in your veins:    Leg swelling:           PHYSICAL EXAM: Vitals:   10/05/21 1113  BP: (!) 156/96  Pulse: 68  Temp: 98.1 F (36.7 C)  TempSrc: Temporal  SpO2: 97%  Weight: 172 lb 6.4 oz (78.2 kg)  Height: 5\' 7"  (1.702 m)    GENERAL: The patient is a well-nourished male, in no acute distress. The vital signs are documented above. CARDIOVASCULAR: 2+ femoral pulses bilaterally.  Palpable history of tibial pulses bilaterally.  He does have some bruising in the  anterior thigh which is resolving and is a yellow discoloration on the right side. PULMONARY: There is good air exchange  MUSCULOSKELETAL: There are no major deformities or cyanosis. NEUROLOGIC: No focal weakness or paresthesias are detected. SKIN: There are no ulcers or rashes noted. PSYCHIATRIC: The patient has a normal affect.  DATA:  None  MEDICAL ISSUES: I reviewed the patient's actual angiogram and angioplasty images with the patient demonstrating his pre and postprocedural results.  He clearly has some bruising in his right thigh with some probable adventitial disruption causing this.  I explained that this does cause irritation in the surrounding tissues but there is no specific treatment or concern and this should continue to resolve spontaneously.  I do not feel that he needs to limit his activity.  He will continue his aspirin and Plavix as directed for a total of 1 year.  He was reassured with this discussion.  I will see him again in 6 months with noninvasive studies    , MD FACS [facility] [phone]  Note: Portions of this report may have been transcribed using voice recognition software.  Every effort has been made to ensure accuracy; however, inadvertent computerized transcription errors may still be present.

## 2021-10-06 ENCOUNTER — Other Ambulatory Visit: Payer: Self-pay

## 2021-10-06 ENCOUNTER — Telehealth: Payer: Self-pay | Admitting: Internal Medicine

## 2021-10-06 MED ORDER — OMEPRAZOLE 40 MG PO CPDR: 40.0000 mg | DELAYED_RELEASE_CAPSULE | Freq: Every day | ORAL | 3 refills | Status: DC

## 2021-10-06 NOTE — Telephone Encounter (Signed)
Patient needs refill to [facility], [place] omeprazole (PRILOSEC) 40 MG capsule

## 2021-10-07 ENCOUNTER — Other Ambulatory Visit: Payer: Self-pay

## 2021-10-07 DIAGNOSIS — I739 Peripheral vascular disease, unspecified: Secondary | ICD-10-CM

## 2021-10-13 ENCOUNTER — Telehealth (HOSPITAL_COMMUNITY): Payer: Medicaid Other | Admitting: Psychiatry

## 2021-10-26 ENCOUNTER — Encounter: Payer: Medicaid Other | Admitting: Vascular Surgery

## 2021-11-02 ENCOUNTER — Encounter: Payer: Medicaid Other | Admitting: Vascular Surgery

## 2021-11-10 ENCOUNTER — Ambulatory Visit: Payer: Medicaid Other | Admitting: Internal Medicine

## 2021-11-10 ENCOUNTER — Encounter: Payer: Self-pay | Admitting: Internal Medicine

## 2021-11-10 VITALS — BP 140/86 | HR 64 | Ht 67.0 in | Wt 172.8 lb

## 2021-11-10 DIAGNOSIS — I10 Essential (primary) hypertension: Secondary | ICD-10-CM | POA: Diagnosis not present

## 2021-11-10 DIAGNOSIS — Z0000 Encounter for general adult medical examination without abnormal findings: Secondary | ICD-10-CM

## 2021-11-10 DIAGNOSIS — M25512 Pain in left shoulder: Secondary | ICD-10-CM | POA: Diagnosis not present

## 2021-11-10 DIAGNOSIS — Z0001 Encounter for general adult medical examination with abnormal findings: Secondary | ICD-10-CM | POA: Diagnosis not present

## 2021-11-10 DIAGNOSIS — E538 Deficiency of other specified B group vitamins: Secondary | ICD-10-CM | POA: Diagnosis not present

## 2021-11-10 MED ORDER — FOLIC ACID 1 MG PO TABS: 1.0000 mg | ORAL_TABLET | Freq: Every day | ORAL | 1 refills | Status: DC

## 2021-11-10 MED ORDER — BENAZEPRIL HCL 40 MG PO TABS: 40.0000 mg | ORAL_TABLET | Freq: Every day | ORAL | 3 refills | Status: DC

## 2021-11-10 NOTE — Progress Notes (Signed)
Established Patient Office Visit  Subjective   Patient ID: BLANE PASK, male    DOB: November 10, 1970  Age: 51 y.o. MRN: [id number]  Chief Complaint  Patient presents with   Follow-up   Mr. Welshans returns to care today.  He is a 51 year old male with a past medical history significant for CAD s/p multiple PCI's, PAD, ischemic cardiomyopathy, HLD, HTN, BPD 1, PUD, schizophrenic disorder, and tobacco use.  Last seen by me on 8/24 to establish care.  At that time his PHQ-9 score was elevated and he was referred to psychiatry to establish care.  He was also referred to cardiology given his significant cardiac history.  His blood pressure was within goal, but that was because he had taken an extra dose of benazepril.  Carvedilol was restarted.  He returns today for follow-up.  Today Mr. Kiesewetter states that he feels well.  He has an appointment with psychiatry next week.  He currently feels depressed but denies SI/HI.  He has a cardiology appointment scheduled for January.  His symptoms today are chronic right lower extremity pain and left shoulder pain he describes pain along the anterior and lateral portions of the left shoulder.  There was no inciting event or trauma at the onset of pain.  He has pain with overhead activities.  Acute concerns, chronic medical conditions, and outstanding preventative healthcare maintenance items discussed today are individually addressed in A/P below.  Past Medical History:  Diagnosis Date   Bipolar 1 disorder ([place])    CAD (coronary artery disease)    a. 12/16/13 inferolat STEMI s/p BMS to RPL. b. Cath 08/2014: interim occlusion of the PLA stent of the RCA, collateralized from the left coronary artery with diffuse nonobstructive CAD;  c. STEMI/PCI: LM nl, LAD nl, LCX 150m/d (2.75x12 synergy DES), RCA 30p, RPDA 75ost/p, RPAV 100 w/ L->R collats.   Chronic back pain    HLD (hyperlipidemia)    Hypertension    Ischemic cardiomyopathy    a. 12/2013: EF 50-55% by echo with  +WMA. b. EF 45% by cath in 08/2014.   Ischemic cardiomyopathy 05/19/2016   Peptic ulcer disease    Schizophrenic disorder ([place])    Tobacco abuse    Past Surgical History:  Procedure Laterality Date   ABDOMINAL AORTOGRAM W/LOWER EXTREMITY Bilateral 09/09/2021   Procedure: ABDOMINAL AORTOGRAM W/LOWER EXTREMITY;  Surgeon: Angelia Mould, MD;  Location: [facility];  Service: Cardiovascular;  Laterality: Bilateral;   CARDIAC CATHETERIZATION N/A 08/07/2014   Procedure: Left Heart Cath and Coronary Angiography;  Surgeon: Sherren Mocha, MD;  Location: [facility];  Service: Cardiovascular;  Laterality: N/A;   COLONOSCOPY N/A 06/06/2017   Procedure: COLONOSCOPY;  Surgeon: Rogene Houston, MD;  [facility];  Service: Endoscopy;  Laterality: N/A;  10:50   CORONARY STENT INTERVENTION N/A 05/19/2016   Procedure: Coronary Stent Intervention;  Surgeon: Lorretta Harp, MD;  Location: [facility];  Service: Cardiovascular;  Laterality: N/A;   CORONARY STENT INTERVENTION N/A 07/02/2018   Procedure: CORONARY STENT INTERVENTION;  Surgeon: Belva Crome, MD;  Location: [facility];  Service: Cardiovascular;  Laterality: N/A;   HERNIA REPAIR     LEFT HEART CATH AND CORONARY ANGIOGRAPHY N/A 05/19/2016   Procedure: Left Heart Cath and Coronary Angiography;  Surgeon: Lorretta Harp, MD;  Location: [facility];  Service: Cardiovascular;  Laterality: N/A;   LEFT HEART CATH AND CORONARY ANGIOGRAPHY N/A 07/02/2018   Procedure: LEFT  HEART CATH AND CORONARY ANGIOGRAPHY;  Surgeon: Belva Crome, MD;  Location: [facility];  Service: Cardiovascular;  Laterality: N/A;   LEFT HEART CATHETERIZATION WITH CORONARY ANGIOGRAM N/A 12/16/2013   Procedure: LEFT HEART CATHETERIZATION WITH CORONARY ANGIOGRAM;  Surgeon: Jettie Booze, MD;  Location: [facility];  Service: Cardiovascular;  Laterality: N/A;   PERCUTANEOUS CORONARY STENT INTERVENTION (PCI-S)  12/16/2013    Procedure: PERCUTANEOUS CORONARY STENT INTERVENTION (PCI-S);  Surgeon: Jettie Booze, MD;  Location: [facility];  Service: Cardiovascular;;  distal rca   PERIPHERAL VASCULAR BALLOON ANGIOPLASTY  09/09/2021   Procedure: PERIPHERAL VASCULAR BALLOON ANGIOPLASTY;  Surgeon: Angelia Mould, MD;  Location: [facility];  Service: Cardiovascular;;  Rt SFA   PERIPHERAL VASCULAR INTERVENTION  09/09/2021   Procedure: PERIPHERAL VASCULAR INTERVENTION;  Surgeon: Angelia Mould, MD;  Location: [place];  Service: Cardiovascular;;  Rt Iliac   Social History   Tobacco Use   Smoking status: Every Day    Packs/day: 1.00    Years: 25.00    Total pack years: 25.00    Types: Cigarettes, Cigars    Start date: 05/25/1988   Smokeless tobacco: Former    Types: Snuff    Quit date: 05/21/1988  Vaping Use   Vaping Use: Never used  Substance Use Topics   Alcohol use: No    Alcohol/week: 0.0 standard drinks of alcohol   Drug use: Yes    Types: Marijuana    Comment: last used today   Family History  Problem Relation Age of Onset   Heart attack Father 56   Heart attack Brother 40   Allergies  Allergen Reactions   Suboxone [Buprenorphine Hcl-Naloxone Hcl] Other (See Comments)    Caused kidney failure   Benadryl [Diphenhydramine] Other (See Comments)    LEG CRAMPING   Seroquel [Quetiapine] Other (See Comments)    "makes my legs cramp"   Tylenol Pm Extra [Diphenhydramine-Apap (Sleep)] Other (See Comments)    "makes my leg cramp"   Review of Systems  Musculoskeletal:  Positive for joint pain (L lateral shoulder pain).       R leg pain  All other systems reviewed and are negative.    Objective:     BP (!) 140/86 (BP Location: Left Arm, Cuff Size: Normal)   Pulse 64   Ht 5\' 7"  (1.702 m)   Wt 172 lb 12.8 oz (78.4 kg)   SpO2 97%   BMI 27.06 kg/m  BP Readings from Last 3 Encounters:  11/10/21 (!) 140/86  10/05/21 (!) 156/96  09/29/21 123/81      Physical  Exam Vitals reviewed.  Constitutional:      General: He is not in acute distress.    Comments: Appears older than stated age  HENT:     Head: Normocephalic and atraumatic.     Right Ear: Tympanic membrane, ear canal and external ear normal.     Left Ear: Tympanic membrane, ear canal and external ear normal.     Nose: Nose normal. No congestion or rhinorrhea.     Mouth/Throat:     Mouth: Mucous membranes are moist.     Pharynx: Oropharynx is clear.  Eyes:     General: No scleral icterus.    Extraocular Movements: Extraocular movements intact.     Conjunctiva/sclera: Conjunctivae normal.     Pupils: Pupils are equal, round, and reactive to light.  Cardiovascular:     Rate and Rhythm: Normal rate and regular  rhythm.     Pulses: Normal pulses.     Heart sounds: Normal heart sounds.     Comments: 3/6 systolic murmur present on exam Pulmonary:     Effort: Pulmonary effort is normal.     Breath sounds: Normal breath sounds. No wheezing, rhonchi or rales.  Abdominal:     General: Abdomen is flat. Bowel sounds are normal. There is no distension.     Palpations: Abdomen is soft.     Tenderness: There is no abdominal tenderness.  Musculoskeletal:        General: No swelling or deformity.     Cervical back: Normal range of motion.     Right lower leg: No edema.     Left lower leg: No edema.     Comments: There is tenderness palpation over the bicipital groove of the left shoulder.  Flexion and extension of the left shoulder is intact.  Intact ER.  IR is limited secondary to pain.  He is unable to abduct beyond 110 degrees due to pain.  Positive empty can the left shoulder.  Negative Hawkins/Neer's.  Skin:    General: Skin is warm and dry.     Capillary Refill: Capillary refill takes less than 2 seconds.  Neurological:     General: No focal deficit present.     Mental Status: He is alert and oriented to person, place, and time.     Motor: No weakness.     Gait: Gait abnormal.      Comments: Ambulates with antalgic gait  Psychiatric:        Mood and Affect: Mood normal.        Behavior: Behavior normal.        Thought Content: Thought content normal.    Last CBC Lab Results  Component Value Date   WBC 9.4 07/23/2020   HGB 16.7 09/09/2021   HCT 49.0 09/09/2021   MCV 93.6 07/23/2020   MCH 31.8 07/23/2020   RDW 13.2 07/23/2020   PLT 231 [id number]   Last metabolic panel Lab Results  Component Value Date   GLUCOSE 117 (H) 09/09/2021   NA 138 09/09/2021   K 5.6 (H) 09/09/2021   CL 106 09/09/2021   CO2 23 07/23/2020   BUN 13 09/09/2021   CREATININE 0.90 09/09/2021   GFRNONAA >60 07/23/2020   CALCIUM 8.9 07/23/2020   PROT 7.0 07/23/2020   ALBUMIN 3.8 07/23/2020   BILITOT 0.5 07/23/2020   ALKPHOS 81 07/23/2020   AST 24 07/23/2020   ALT 28 07/23/2020   ANIONGAP 8 07/23/2020   Last lipids Lab Results  Component Value Date   CHOL 219 (H) 09/10/2021   HDL 25 (L) 09/10/2021   LDLCALC 168 (H) 09/10/2021   TRIG 128 09/10/2021   CHOLHDL 8.8 09/10/2021   Last hemoglobin A1c Lab Results  Component Value Date   HGBA1C 5.6 09/29/2021   Last thyroid functions Lab Results  Component Value Date   TSH 1.310 09/29/2021   Last vitamin B12 and Folate Lab Results  Component Value Date   [id number] 09/29/2021   FOLATE 2.5 (L) 09/29/2021      Assessment & Plan:   Problem List Items Addressed This Visit       Hypertension    BP 150/86 today.  He is currently prescribed carvedilol 6.25 mg twice daily and benazepril 20 mg daily.  He states that his blood pressure was well controlled at his last appointment because he took an extra dose of benazepril.  He has not been checking his blood pressure at home. -Increase benazepril to 40 mg daily today.  Continue carvedilol at current dose. -Follow-up in 4 weeks for BP check and repeat labs      Preventative health care    He declined outstanding vaccinations today      Folate deficiency - Primary     Folic acid deficiency noted on baseline labs obtained in late August.  I recommended that he start supplementation that time but he has not been able to.  Folic acid supplementation prescribed today.      Left shoulder pain    He reports gradual onset of left anterior and lateral shoulder pain over the past several weeks.  He has tenderness palpation over the bicipital groove and is unable to abduct beyond 110 degrees secondary to pain.  Positive empty can test as well.  His symptoms are most consistent with impingement syndrome of the left shoulder, however there may also be some degree of biceps tendinitis given his tenderness palpation over the bicipital groove.  We discussed conservative treatment measures for now including use of Tylenol for pain relief and home PT.  He has been provided with exercises to perform at home.  He will follow-up in 4 weeks for reassessment.      Return in about 4 weeks (around 12/08/2021).    Johnette Abraham, MD

## 2021-11-10 NOTE — Patient Instructions (Signed)
It was a pleasure to see you today.  Thank you for giving [place] the opportunity to be involved in your care.  Below is a brief recap of your visit and next steps.  We will plan to see you again in 4 weeks.  Summary We will increase benazepril to 40 mg daily today I have prescribed folic acid supplementation  Next steps Follow up in 1 month to check your blood pressure.

## 2021-11-14 ENCOUNTER — Encounter (HOSPITAL_COMMUNITY): Payer: Self-pay | Admitting: Student in an Organized Health Care Education/Training Program

## 2021-11-14 ENCOUNTER — Ambulatory Visit (HOSPITAL_BASED_OUTPATIENT_CLINIC_OR_DEPARTMENT_OTHER): Payer: Medicaid Other | Admitting: Student in an Organized Health Care Education/Training Program

## 2021-11-14 DIAGNOSIS — F411 Generalized anxiety disorder: Secondary | ICD-10-CM | POA: Diagnosis not present

## 2021-11-14 DIAGNOSIS — G4700 Insomnia, unspecified: Secondary | ICD-10-CM

## 2021-11-14 DIAGNOSIS — F339 Major depressive disorder, recurrent, unspecified: Secondary | ICD-10-CM

## 2021-11-14 DIAGNOSIS — F4310 Post-traumatic stress disorder, unspecified: Secondary | ICD-10-CM

## 2021-11-14 DIAGNOSIS — F209 Schizophrenia, unspecified: Secondary | ICD-10-CM

## 2021-11-14 MED ORDER — MIRTAZAPINE 7.5 MG PO TABS: 7.5000 mg | ORAL_TABLET | Freq: Every day | ORAL | 1 refills | Status: DC

## 2021-11-14 MED ORDER — RISPERIDONE 0.5 MG PO TABS: 0.5000 mg | ORAL_TABLET | Freq: Two times a day (BID) | ORAL | 1 refills | Status: DC
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
## 2021-11-16 ENCOUNTER — Encounter: Payer: Self-pay | Admitting: Internal Medicine

## 2021-11-16 DIAGNOSIS — E538 Deficiency of other specified B group vitamins: Secondary | ICD-10-CM | POA: Insufficient documentation

## 2021-11-16 DIAGNOSIS — M25512 Pain in left shoulder: Secondary | ICD-10-CM | POA: Insufficient documentation

## 2021-11-16 NOTE — Assessment & Plan Note (Signed)
BP 150/86 today.  He is currently prescribed carvedilol 6.25 mg twice daily and benazepril 20 mg daily.  He states that his blood pressure was well controlled at his last appointment because he took an extra dose of benazepril.  He has not been checking his blood pressure at home. -Increase benazepril to 40 mg daily today.  Continue carvedilol at current dose. -Follow-up in 4 weeks for BP check and repeat labs

## 2021-11-16 NOTE — Assessment & Plan Note (Signed)
He reports gradual onset of left anterior and lateral shoulder pain over the past several weeks.  He has tenderness palpation over the bicipital groove and is unable to abduct beyond 110 degrees secondary to pain.  Positive empty can test as well.  His symptoms are most consistent with impingement syndrome of the left shoulder, however there may also be some degree of biceps tendinitis given his tenderness palpation over the bicipital groove.  We discussed conservative treatment measures for now including use of Tylenol for pain relief and home PT.  He has been provided with exercises to perform at home.  He will follow-up in 4 weeks for reassessment.

## 2021-11-16 NOTE — Assessment & Plan Note (Signed)
He declined outstanding vaccinations today

## 2021-11-16 NOTE — Assessment & Plan Note (Signed)
Folic acid deficiency noted on baseline labs obtained in late August.  I recommended that he start supplementation that time but he has not been able to.  Folic acid supplementation prescribed today.

## 2021-11-30 ENCOUNTER — Encounter: Payer: Self-pay | Admitting: Internal Medicine

## 2021-11-30 ENCOUNTER — Ambulatory Visit: Payer: Medicaid Other | Attending: Internal Medicine | Admitting: Internal Medicine

## 2021-11-30 VITALS — BP 130/82 | HR 98 | Ht 67.0 in | Wt 178.8 lb

## 2021-11-30 DIAGNOSIS — R079 Chest pain, unspecified: Secondary | ICD-10-CM | POA: Diagnosis present

## 2021-11-30 DIAGNOSIS — E782 Mixed hyperlipidemia: Secondary | ICD-10-CM | POA: Diagnosis present

## 2021-11-30 DIAGNOSIS — I10 Essential (primary) hypertension: Secondary | ICD-10-CM | POA: Diagnosis present

## 2021-11-30 NOTE — H&P (View-Only) (Signed)
Cardiology Office Note  Date: 11/30/2021   ID: [name], DOB 05-06-1970, MRN [id number]  PCP:  Johnette Abraham, MD  Cardiologist:  Carlyle Dolly, MD Electrophysiologist:  None   Reason for Office Visit: Evaluation of CAD and HLD   History of Present Illness: Don Fletcher is a 51 y.o. male known to have CAD manifested by inferolateral STEMI in 2015 s/p BMS to RPL, positive functional study in 2016 s/p RPL ISR with collaterals from LCA, inferolateral STEMI in 2018 s/p LCx PCI, NSTEMI 2020 s/p distal RCA PCI extending into the PDA with LVEF 55%, PAD s/p R EIA stent and R SFA angioplasty in 09/2021 on DAPT, HTN, HLD, tobacco abuse presented to the cardiology clinic for follow-up of CAD.  He follows up with vascular surgery for management of PAD.  Patient denies any angina, DOE, lightheadedness, dizziness, syncope. Patient reported that after having his PAD procedure in 09/2021, he did not feel improvement in his claudication symptoms however he also had right intramuscular hematoma at the same time. He continues to deny angina however his ambulation is limited by pain in his bilateral hips. He stated he will need bilateral hip surgery at some point and is requesting for preop cardiac clearance. Currently he is on DAPT for his PAD PCI. He is taking atorvastatin 80 mg nightly, however he missed couple of doses in the last 1 year. He cut down smoking cigars from 2 packs to 1 pack a day currently, denies alcohol or drug use. 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  Past Medical History:  Diagnosis Date   Bipolar 1 disorder ([place])    CAD (coronary artery disease)    a. 12/16/13 inferolat STEMI s/p BMS to RPL. b. Cath 08/2014: interim occlusion of the PLA stent of the RCA, collateralized from the left coronary artery with diffuse nonobstructive CAD;  c. STEMI/PCI: LM nl, LAD nl, LCX 159m/d (2.75x12 synergy DES), RCA 30p, RPDA 75ost/p, RPAV  100 w/ L->R collats.   Chronic back pain    HLD (hyperlipidemia)    Hypertension    Ischemic cardiomyopathy    a. 12/2013: EF 50-55% by echo with +WMA. b. EF 45% by cath in 08/2014.   Ischemic cardiomyopathy 05/19/2016   Peptic ulcer disease    Schizophrenic disorder ([place])    Tobacco abuse     Past Surgical History:  Procedure Laterality Date   ABDOMINAL AORTOGRAM W/LOWER EXTREMITY Bilateral 09/09/2021   Procedure: ABDOMINAL AORTOGRAM W/LOWER EXTREMITY;  Surgeon: Angelia Mould, MD;  Location: [facility];  Service: Cardiovascular;  Laterality: Bilateral;   CARDIAC CATHETERIZATION N/A 08/07/2014   Procedure: Left Heart Cath and Coronary Angiography;  Surgeon: Sherren Mocha, MD;  Location: [facility];  Service: Cardiovascular;  Laterality: N/A;   COLONOSCOPY N/A 06/06/2017   Procedure: COLONOSCOPY;  Surgeon: Rogene Houston, MD;  Location: [facility];  Service: Endoscopy;  Laterality: N/A;  10:50   CORONARY STENT INTERVENTION N/A 05/19/2016   Procedure: Coronary Stent Intervention;  Surgeon: Lorretta Harp, MD;  Location: [facility];  Service: Cardiovascular;  Laterality: N/A;   CORONARY STENT INTERVENTION N/A 07/02/2018   Procedure: CORONARY STENT INTERVENTION;  Surgeon: Belva Crome, MD;  Location: [facility];  Service: Cardiovascular;  Laterality: N/A;   HERNIA REPAIR     LEFT HEART CATH AND CORONARY ANGIOGRAPHY N/A 05/19/2016   Procedure: Left  Heart Cath and Coronary Angiography;  Surgeon: Lorretta Harp, MD;  Location: [facility];  Service: Cardiovascular;  Laterality: N/A;   LEFT HEART CATH AND CORONARY ANGIOGRAPHY N/A 07/02/2018   Procedure: LEFT HEART CATH AND CORONARY ANGIOGRAPHY;  Surgeon: Belva Crome, MD;  Location: [facility];  Service: Cardiovascular;  Laterality: N/A;   LEFT HEART CATHETERIZATION WITH CORONARY ANGIOGRAM N/A 12/16/2013   Procedure: LEFT HEART CATHETERIZATION WITH CORONARY ANGIOGRAM;  Surgeon: Jettie Booze, MD;  Location: [facility];  Service: Cardiovascular;  Laterality: N/A;   PERCUTANEOUS CORONARY STENT INTERVENTION (PCI-S)  12/16/2013   Procedure: PERCUTANEOUS CORONARY STENT INTERVENTION (PCI-S);  Surgeon: Jettie Booze, MD;  Location: [facility] CATH [facility];  Service: Cardiovascular;;  distal rca   PERIPHERAL VASCULAR BALLOON ANGIOPLASTY  09/09/2021   Procedure: PERIPHERAL VASCULAR BALLOON ANGIOPLASTY;  Surgeon: Angelia Mould, MD;  Location: [place] [facility];  Service: Cardiovascular;;  Rt SFA   PERIPHERAL VASCULAR INTERVENTION  09/09/2021   Procedure: PERIPHERAL VASCULAR INTERVENTION;  Surgeon: Angelia Mould, MD;  Location: [place] [facility];  Service: Cardiovascular;;  Rt Iliac    Current Outpatient Medications  Medication Sig Dispense Refill   aspirin EC 81 MG tablet Take 1 tablet (81 mg total) by mouth daily. Swallow whole. 150 tablet 2   atorvastatin (LIPITOR) 80 MG tablet TAKE 1 TABLET ONCE DAILY. (Patient taking differently: Take 80 mg by mouth at bedtime.) 30 tablet 6   benazepril (LOTENSIN) 40 MG tablet Take 1 tablet (40 mg total) by mouth daily. 90 tablet 3   carvedilol (COREG) 6.25 MG tablet Take 1 tablet (6.25 mg total) by mouth 2 (two) times daily with a meal. 60 tablet 3   clopidogrel (PLAVIX) 75 MG tablet Take 1 tablet (75 mg total) by mouth daily. 30 tablet 11   folic acid (FOLVITE) 1 MG tablet Take 1 tablet (1 mg total) by mouth daily. 90 tablet 1   mirtazapine (REMERON) 7.5 MG tablet Take 1 tablet (7.5 mg total) by mouth at bedtime. 30 tablet 1   omeprazole (PRILOSEC) 40 MG capsule Take 1 capsule (40 mg total) by mouth daily. Take 40 mg by mouth daily. 30 capsule 3   risperiDONE (RISPERDAL) 0.5 MG tablet Take 1 tablet (0.5 mg total) by mouth 2 (two) times daily. 60 tablet 1   No current facility-administered medications for this visit.   Allergies:  Suboxone [buprenorphine hcl-naloxone hcl], Benadryl [diphenhydramine], Seroquel [quetiapine], and  Tylenol pm extra [diphenhydramine-apap (sleep)]   Social History: The patient  reports that he has been smoking cigarettes and cigars. He started smoking about 33 years ago. He has a 25.00 pack-year smoking history. He quit smokeless tobacco use about 33 years ago.  His smokeless tobacco use included snuff. He reports current drug use. Drug: Marijuana. He reports that he does not drink alcohol.   Family History: The patient's family history includes Heart attack (age of onset: 52) in his brother and father.   ROS:  Please see the history of present illness. Otherwise, complete review of systems is positive for none.  All other systems are reviewed and negative.   Physical Exam: VS:  BP 130/82   Pulse 98   Ht 5\' 7"  (1.702 m)   Wt 178 lb 12.8 oz (81.1 kg)   SpO2 97%   BMI 28.00 kg/m , BMI Body mass index is 28 kg/m.  Wt Readings from Last 3 Encounters:  11/30/21 178 lb 12.8 oz (81.1 kg)  11/10/21 172 lb 12.8 oz (78.4 kg)  10/05/21 172 lb 6.4 oz (78.2 kg)    General: Patient appears comfortable at rest. HEENT: Conjunctiva and lids normal, oropharynx clear with moist mucosa. Neck: Supple, no elevated JVP or carotid bruits, no thyromegaly. Lungs: Clear to auscultation, nonlabored breathing at rest. Cardiac: Regular rate and rhythm, no S3 or significant systolic murmur, no pericardial rub. Abdomen: Soft, nontender, no hepatomegaly, bowel sounds present, no guarding or rebound. Extremities: No pitting edema Skin: Warm and dry. Musculoskeletal: No kyphosis. Neuropsychiatric: Alert and oriented x3, affect grossly appropriate.  ECG:  An ECG dated 11/30/2021 was personally reviewed today and demonstrated:  Normal sinus rhythm  Recent Labwork: 09/09/2021: BUN 13; Creatinine, Ser 0.90; Hemoglobin 16.7; Potassium 5.6; Sodium 138 09/29/2021: TSH 1.310     Component Value Date/Time   CHOL 219 (H) 09/10/2021 0150   TRIG 128 09/10/2021 0150   HDL 25 (L) 09/10/2021 0150   CHOLHDL 8.8 09/10/2021  0150   VLDL 26 09/10/2021 0150   LDLCALC 168 (H) 09/10/2021 0150    Other Studies Reviewed Today: Echo in 2019 - Left ventricle: The cavity size was normal. Wall thickness was    normal. The estimated ejection fraction was 55%. There is    hypokinesis of the basal-midinferolateral and inferior    myocardium. Left ventricular diastolic function parameters were    normal for the patient&'s age.  - Aortic valve: Mildly calcified annulus. Trileaflet.  - Mitral valve: There was trivial regurgitation.  - Right atrium: Central venous pressure (est): 3 mm Hg.  - Atrial septum: No defect or patent foramen ovale was identified.  - Tricuspid valve: There was trivial regurgitation.  - Pulmonary arteries: PA peak pressure: 14 mm Hg (S).  - Pericardium, extracardiac: There was no pericardial effusion.   I personally reviewed the cath reports till date.  Assessment and Plan: Patient is a 51 year old male known to have CAD manifested by inferolateral STEMI in 2015 s/p BMS to RPL, positive functional study in 2016 s/p RPL ISR with collaterals from LCA, inferolateral STEMI in 2018 s/p LCx PCI, NSTEMI 2020 s/p distal RCA PCI extending into the PDA with LVEF 55%, PAD s/p R EIA stent and R SFA angioplasty in 09/2021 on DAPT, HTN, HLD, tobacco abuse presented to the cardiology clinic for follow-up visit and preop cardiac risk stratification for bilateral hip surgery.  #Preop cardiac risk stratification for bilateral hip surgery Plan -Patient has less than 4 METS due to limited ambulation from bilateral hip pain. Denied any angina/DOE.  I will obtain nuclear stress test and if he does not have any high risk findings, he will be at a moderate risk for any perioperative cardiac events if he undergoes hip surgery. -Continue aspirin throughout the perioperative period.  Patient is currently on Plavix due to his PAD surgery.  Recommend to reach out to the vascular surgery about Plavix recommendations prior to the hip  surgery.  #CAD manifested by inferolateral STEMI in 2015 s/p BMS to RPL, positive functional study in 2016 s/p RPL ISR with collaterals from LCA, inferolateral STEMI in 2018 s/p LCx PCI, NSTEMI 2020 s/p distal RCA PCI extending into the PDA with LVEF 55%, currently angina free Plan -Continue aspirin 81 mg once daily -Continue atorvastatin 80 mg nightly. Patient will benefit from PCSK9 inhibitors as his LDL was 160s.  His goal LDL is less than 55. Patient agreeable to PCSK9 inhibitors initiation. Referral to Pharm.D. -Continue carvedilol 6.25 mg twice daily -Continue benazepril 40 mg once daily -  SL NTG 0.4 mg as needed  #PAD s/p R EIA stent and R SFA angioplasty Plan -Continue aspirin 81 mg once daily and Plavix 70 mg once daily, per vascular surgery -Follow-up with vascular surgery  #HLD, currently not at goal (goal less than 55) Plan -Continue atorvastatin 80 mg nightly. Patient will benefit from PCSK9 inhibitors as his LDL was 160s.  His goal LDL is less than 55. Patient agreeable to PCSK9 inhibitors initiation. Referral to Pharm.D.  #Nicotine abuse Plan -Smoking cessation counseling provided.  Patient instructed to cut down smoking cigarettes.  He voiced understanding.  #HTN, controlled Plan --Continue carvedilol 6.25 mg twice daily -Continue benazepril 40 mg once daily  I have spent a total of 35 minutes with patient reviewing chart , telemetry, EKGs, labs and examining patient as well as establishing an assessment and plan that was discussed with the patient.  > 50% of time was spent in direct patient care.     Medication Adjustments/Labs and Tests Ordered: Current medicines are reviewed at length with the patient today.  Concerns regarding medicines are outlined above.   Tests Ordered: Orders Placed This Encounter  Procedures   NM Myocar Multi W/Spect W/Wall Motion / EF   Lipid panel   AMB Referral to [facility] Pharm-D   EKG 12-Lead    Medication Changes: No orders  of the defined types were placed in this encounter.   Disposition:  Follow up  3 months  Signed, Zakiyah Diop Fidel Levy, MD, 11/30/2021 9:05 AM    Don Fletcher at [facility] [address], [place], [place] [address]

## 2021-11-30 NOTE — Patient Instructions (Signed)
Medication Instructions:  Your physician recommends that you continue on your current medications as directed. Please refer to the Current Medication list given to you today.   Labwork: Before Your Next Visit: -Lipid Panel  Testing/Procedures: Your physician has requested that you have a lexiscan myoview. For further information please visit [email or web address]. Please follow instruction sheet, as given.   Follow-Up: Follow up with Dr. Dellia Cloud in 3 months  Any Other Special Instructions Will Be Listed Below (If Applicable).  You have been referred to Pharm D. They will call you with your first appointment.    If you need a refill on your cardiac medications before your next appointment, please call your pharmacy.

## 2021-11-30 NOTE — Progress Notes (Signed)
Cardiology Office Note  Date: 11/30/2021   ID: [name], DOB 05-06-1970, MRN [id number]  PCP:  Johnette Abraham, MD  Cardiologist:  Carlyle Dolly, MD Electrophysiologist:  None   Reason for Office Visit: Evaluation of CAD and HLD   History of Present Illness: Don Fletcher is a 51 y.o. male known to have CAD manifested by inferolateral STEMI in 2015 s/p BMS to RPL, positive functional study in 2016 s/p RPL ISR with collaterals from LCA, inferolateral STEMI in 2018 s/p LCx PCI, NSTEMI 2020 s/p distal RCA PCI extending into the PDA with LVEF 55%, PAD s/p R EIA stent and R SFA angioplasty in 09/2021 on DAPT, HTN, HLD, tobacco abuse presented to the cardiology clinic for follow-up of CAD.  He follows up with vascular surgery for management of PAD.  Patient denies any angina, DOE, lightheadedness, dizziness, syncope. Patient reported that after having his PAD procedure in 09/2021, he did not feel improvement in his claudication symptoms however he also had right intramuscular hematoma at the same time. He continues to deny angina however his ambulation is limited by pain in his bilateral hips. He stated he will need bilateral hip surgery at some point and is requesting for preop cardiac clearance. Currently he is on DAPT for his PAD PCI. He is taking atorvastatin 80 mg nightly, however he missed couple of doses in the last 1 year. He cut down smoking cigars from 2 packs to 1 pack a day currently, denies alcohol or drug use. 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  Past Medical History:  Diagnosis Date   Bipolar 1 disorder ([place])    CAD (coronary artery disease)    a. 12/16/13 inferolat STEMI s/p BMS to RPL. b. Cath 08/2014: interim occlusion of the PLA stent of the RCA, collateralized from the left coronary artery with diffuse nonobstructive CAD;  c. STEMI/PCI: LM nl, LAD nl, LCX 159m/d (2.75x12 synergy DES), RCA 30p, RPDA 75ost/p, RPAV  100 w/ L->R collats.   Chronic back pain    HLD (hyperlipidemia)    Hypertension    Ischemic cardiomyopathy    a. 12/2013: EF 50-55% by echo with +WMA. b. EF 45% by cath in 08/2014.   Ischemic cardiomyopathy 05/19/2016   Peptic ulcer disease    Schizophrenic disorder ([place])    Tobacco abuse     Past Surgical History:  Procedure Laterality Date   ABDOMINAL AORTOGRAM W/LOWER EXTREMITY Bilateral 09/09/2021   Procedure: ABDOMINAL AORTOGRAM W/LOWER EXTREMITY;  Surgeon: Angelia Mould, MD;  Location: [place] [facility];  Service: Cardiovascular;  Laterality: Bilateral;   CARDIAC CATHETERIZATION N/A 08/07/2014   Procedure: Left Heart Cath and Coronary Angiography;  Surgeon: Sherren Mocha, MD;  Location: [place] [facility];  Service: Cardiovascular;  Laterality: N/A;   COLONOSCOPY N/A 06/06/2017   Procedure: COLONOSCOPY;  Surgeon: Rogene Houston, MD;  [facility];  Service: Endoscopy;  Laterality: N/A;  10:50   CORONARY STENT INTERVENTION N/A 05/19/2016   Procedure: Coronary Stent Intervention;  Surgeon: Lorretta Harp, MD;  Location: [place] [facility];  Service: Cardiovascular;  Laterality: N/A;   CORONARY STENT INTERVENTION N/A 07/02/2018   Procedure: CORONARY STENT INTERVENTION;  Surgeon: Belva Crome, MD;  Location: [place] [facility];  Service: Cardiovascular;  Laterality: N/A;   HERNIA REPAIR     LEFT HEART CATH AND CORONARY ANGIOGRAPHY N/A 05/19/2016   Procedure: Left  Heart Cath and Coronary Angiography;  Surgeon: Lorretta Harp, MD;  Location: [place] [facility];  Service: Cardiovascular;  Laterality: N/A;   LEFT HEART CATH AND CORONARY ANGIOGRAPHY N/A 07/02/2018   Procedure: LEFT HEART CATH AND CORONARY ANGIOGRAPHY;  Surgeon: Belva Crome, MD;  Location: [place] [facility];  Service: Cardiovascular;  Laterality: N/A;   LEFT HEART CATHETERIZATION WITH CORONARY ANGIOGRAM N/A 12/16/2013   Procedure: LEFT HEART CATHETERIZATION WITH CORONARY ANGIOGRAM;  Surgeon: Jettie Booze, MD;  Location: [facility];  Service: Cardiovascular;  Laterality: N/A;   PERCUTANEOUS CORONARY STENT INTERVENTION (PCI-S)  12/16/2013   Procedure: PERCUTANEOUS CORONARY STENT INTERVENTION (PCI-S);  Surgeon: Jettie Booze, MD;  Location: [facility] CATH LAB;  Service: Cardiovascular;;  distal rca   PERIPHERAL VASCULAR BALLOON ANGIOPLASTY  09/09/2021   Procedure: PERIPHERAL VASCULAR BALLOON ANGIOPLASTY;  Surgeon: Angelia Mould, MD;  Location: [place] [facility];  Service: Cardiovascular;;  Rt SFA   PERIPHERAL VASCULAR INTERVENTION  09/09/2021   Procedure: PERIPHERAL VASCULAR INTERVENTION;  Surgeon: Angelia Mould, MD;  Location: [place] [facility];  Service: Cardiovascular;;  Rt Iliac    Current Outpatient Medications  Medication Sig Dispense Refill   aspirin EC 81 MG tablet Take 1 tablet (81 mg total) by mouth daily. Swallow whole. 150 tablet 2   atorvastatin (LIPITOR) 80 MG tablet TAKE 1 TABLET ONCE DAILY. (Patient taking differently: Take 80 mg by mouth at bedtime.) 30 tablet 6   benazepril (LOTENSIN) 40 MG tablet Take 1 tablet (40 mg total) by mouth daily. 90 tablet 3   carvedilol (COREG) 6.25 MG tablet Take 1 tablet (6.25 mg total) by mouth 2 (two) times daily with a meal. 60 tablet 3   clopidogrel (PLAVIX) 75 MG tablet Take 1 tablet (75 mg total) by mouth daily. 30 tablet 11   folic acid (FOLVITE) 1 MG tablet Take 1 tablet (1 mg total) by mouth daily. 90 tablet 1   mirtazapine (REMERON) 7.5 MG tablet Take 1 tablet (7.5 mg total) by mouth at bedtime. 30 tablet 1   omeprazole (PRILOSEC) 40 MG capsule Take 1 capsule (40 mg total) by mouth daily. Take 40 mg by mouth daily. 30 capsule 3   risperiDONE (RISPERDAL) 0.5 MG tablet Take 1 tablet (0.5 mg total) by mouth 2 (two) times daily. 60 tablet 1   No current facility-administered medications for this visit.   Allergies:  Suboxone [buprenorphine hcl-naloxone hcl], Benadryl [diphenhydramine], Seroquel [quetiapine], and  Tylenol pm extra [diphenhydramine-apap (sleep)]   Social History: The patient  reports that he has been smoking cigarettes and cigars. He started smoking about 33 years ago. He has a 25.00 pack-year smoking history. He quit smokeless tobacco use about 33 years ago.  His smokeless tobacco use included snuff. He reports current drug use. Drug: Marijuana. He reports that he does not drink alcohol.   Family History: The patient's family history includes Heart attack (age of onset: 41) in his brother and father.   ROS:  Please see the history of present illness. Otherwise, complete review of systems is positive for none.  All other systems are reviewed and negative.   Physical Exam: VS:  BP 130/82   Pulse 98   Ht 5\' 7"  (1.702 m)   Wt 178 lb 12.8 oz (81.1 kg)   SpO2 97%   BMI 28.00 kg/m , BMI Body mass index is 28 kg/m.  Wt Readings from Last 3 Encounters:  11/30/21 178 lb 12.8 oz (81.1 kg)  11/10/21 172 lb 12.8 oz (78.4 kg)  10/05/21 172 lb 6.4 oz (78.2 kg)    General: Patient appears comfortable at rest. HEENT: Conjunctiva and lids normal, oropharynx clear with moist mucosa. Neck: Supple, no elevated JVP or carotid bruits, no thyromegaly. Lungs: Clear to auscultation, nonlabored breathing at rest. Cardiac: Regular rate and rhythm, no S3 or significant systolic murmur, no pericardial rub. Abdomen: Soft, nontender, no hepatomegaly, bowel sounds present, no guarding or rebound. Extremities: No pitting edema Skin: Warm and dry. Musculoskeletal: No kyphosis. Neuropsychiatric: Alert and oriented x3, affect grossly appropriate.  ECG:  An ECG dated 11/30/2021 was personally reviewed today and demonstrated:  Normal sinus rhythm  Recent Labwork: 09/09/2021: BUN 13; Creatinine, Ser 0.90; Hemoglobin 16.7; Potassium 5.6; Sodium 138 09/29/2021: TSH 1.310     Component Value Date/Time   CHOL 219 (H) 09/10/2021 0150   TRIG 128 09/10/2021 0150   HDL 25 (L) 09/10/2021 0150   CHOLHDL 8.8 09/10/2021  0150   VLDL 26 09/10/2021 0150   LDLCALC 168 (H) 09/10/2021 0150    Other Studies Reviewed Today: Echo in 2019 - Left ventricle: The cavity size was normal. Wall thickness was    normal. The estimated ejection fraction was 55%. There is    hypokinesis of the basal-midinferolateral and inferior    myocardium. Left ventricular diastolic function parameters were    normal for the patient&'s age.  - Aortic valve: Mildly calcified annulus. Trileaflet.  - Mitral valve: There was trivial regurgitation.  - Right atrium: Central venous pressure (est): 3 mm Hg.  - Atrial septum: No defect or patent foramen ovale was identified.  - Tricuspid valve: There was trivial regurgitation.  - Pulmonary arteries: PA peak pressure: 14 mm Hg (S).  - Pericardium, extracardiac: There was no pericardial effusion.   I personally reviewed the cath reports till date.  Assessment and Plan: Patient is a 51 year old male known to have CAD manifested by inferolateral STEMI in 2015 s/p BMS to RPL, positive functional study in 2016 s/p RPL ISR with collaterals from LCA, inferolateral STEMI in 2018 s/p LCx PCI, NSTEMI 2020 s/p distal RCA PCI extending into the PDA with LVEF 55%, PAD s/p R EIA stent and R SFA angioplasty in 09/2021 on DAPT, HTN, HLD, tobacco abuse presented to the cardiology clinic for follow-up visit and preop cardiac risk stratification for bilateral hip surgery.  #Preop cardiac risk stratification for bilateral hip surgery Plan -Patient has less than 4 METS due to limited ambulation from bilateral hip pain. Denied any angina/DOE.  I will obtain nuclear stress test and if he does not have any high risk findings, he will be at a moderate risk for any perioperative cardiac events if he undergoes hip surgery. -Continue aspirin throughout the perioperative period.  Patient is currently on Plavix due to his PAD surgery.  Recommend to reach out to the vascular surgery about Plavix recommendations prior to the hip  surgery.  #CAD manifested by inferolateral STEMI in 2015 s/p BMS to RPL, positive functional study in 2016 s/p RPL ISR with collaterals from LCA, inferolateral STEMI in 2018 s/p LCx PCI, NSTEMI 2020 s/p distal RCA PCI extending into the PDA with LVEF 55%, currently angina free Plan -Continue aspirin 81 mg once daily -Continue atorvastatin 80 mg nightly. Patient will benefit from PCSK9 inhibitors as his LDL was 160s.  His goal LDL is less than 55. Patient agreeable to PCSK9 inhibitors initiation. Referral to Pharm.D. -Continue carvedilol 6.25 mg twice daily -Continue benazepril 40 mg once daily -  SL NTG 0.4 mg as needed  #PAD s/p R EIA stent and R SFA angioplasty Plan -Continue aspirin 81 mg once daily and Plavix 70 mg once daily, per vascular surgery -Follow-up with vascular surgery  #HLD, currently not at goal (goal less than 55) Plan -Continue atorvastatin 80 mg nightly. Patient will benefit from PCSK9 inhibitors as his LDL was 160s.  His goal LDL is less than 55. Patient agreeable to PCSK9 inhibitors initiation. Referral to Pharm.D.  #Nicotine abuse Plan -Smoking cessation counseling provided.  Patient instructed to cut down smoking cigarettes.  He voiced understanding.  #HTN, controlled Plan --Continue carvedilol 6.25 mg twice daily -Continue benazepril 40 mg once daily  I have spent a total of 35 minutes with patient reviewing chart , telemetry, EKGs, labs and examining patient as well as establishing an assessment and plan that was discussed with the patient.  > 50% of time was spent in direct patient care.     Medication Adjustments/Labs and Tests Ordered: Current medicines are reviewed at length with the patient today.  Concerns regarding medicines are outlined above.   Tests Ordered: Orders Placed This Encounter  Procedures   NM Myocar Multi W/Spect W/Wall Motion / EF   Lipid panel   AMB Referral to [facility] Pharm-D   EKG 12-Lead    Medication Changes: No orders  of the defined types were placed in this encounter.   Disposition:  Follow up  3 months  Signed, Rachelle Edwards Fidel Levy, MD, 11/30/2021 9:05 AM    [place] [facility] [address] [place], [place] [address]

## 2021-12-08 ENCOUNTER — Encounter (HOSPITAL_COMMUNITY)
Admission: RE | Admit: 2021-12-08 | Discharge: 2021-12-08 | Disposition: A | Discharge status: Home / Self Care | Payer: Medicaid Other | Source: Ambulatory Visit | Attending: Internal Medicine | Admitting: Internal Medicine

## 2021-12-08 ENCOUNTER — Ambulatory Visit (HOSPITAL_COMMUNITY)
Admission: RE | Admit: 2021-12-08 | Discharge: 2021-12-08 | Disposition: A | Discharge status: Home / Self Care | Payer: Medicaid Other | Source: Ambulatory Visit | Attending: Cardiology | Admitting: Cardiology

## 2021-12-08 ENCOUNTER — Ambulatory Visit: Payer: Medicaid Other | Admitting: Internal Medicine

## 2021-12-08 DIAGNOSIS — R079 Chest pain, unspecified: Secondary | ICD-10-CM | POA: Insufficient documentation

## 2021-12-08 LAB — NM MYOCAR MULTI W/SPECT W/WALL MOTION / EF
LV dias vol: 109 mL (ref 62–150)
LV sys vol: 51 mL
Nuc Stress EF: 54 %
RATE: 0.7
Rest Nuclear Isotope Dose: 10.9 mCi
SDS: 4
SRS: 7
SSS: 11
ST Depression (mm): 0 mm
Stress Nuclear Isotope Dose: 33 mCi
TID: 1.02

## 2021-12-08 MED ORDER — TECHNETIUM TC 99M TETROFOSMIN IV KIT
30.0000 | PACK | Freq: Once | INTRAVENOUS | Status: AC | PRN
  Administered 2021-12-08: 33 via INTRAVENOUS

## 2021-12-08 MED ORDER — SODIUM CHLORIDE FLUSH 0.9 % IV SOLN
INTRAVENOUS | Status: AC
  Administered 2021-12-08: 10 mL
  Filled 2021-12-08: qty 10

## 2021-12-08 MED ORDER — TECHNETIUM TC 99M TETROFOSMIN IV KIT
10.0000 | PACK | Freq: Once | INTRAVENOUS | Status: AC | PRN
  Administered 2021-12-08: 10.9 via INTRAVENOUS

## 2021-12-08 MED ORDER — REGADENOSON 0.4 MG/5ML IV SOLN
INTRAVENOUS | Status: AC
  Administered 2021-12-08: 0.4 mg
  Filled 2021-12-08: qty 5

## 2021-12-12 ENCOUNTER — Encounter: Payer: Self-pay | Admitting: Internal Medicine

## 2021-12-12 ENCOUNTER — Ambulatory Visit: Payer: Medicaid Other | Admitting: Internal Medicine

## 2021-12-12 VITALS — BP 118/81 | HR 61 | Ht 69.0 in | Wt 177.4 lb

## 2021-12-12 DIAGNOSIS — I10 Essential (primary) hypertension: Secondary | ICD-10-CM

## 2021-12-12 DIAGNOSIS — K219 Gastro-esophageal reflux disease without esophagitis: Secondary | ICD-10-CM | POA: Diagnosis not present

## 2021-12-12 DIAGNOSIS — E538 Deficiency of other specified B group vitamins: Secondary | ICD-10-CM | POA: Diagnosis not present

## 2021-12-12 DIAGNOSIS — R911 Solitary pulmonary nodule: Secondary | ICD-10-CM

## 2021-12-12 DIAGNOSIS — E785 Hyperlipidemia, unspecified: Secondary | ICD-10-CM | POA: Diagnosis not present

## 2021-12-12 MED ORDER — PANTOPRAZOLE SODIUM 20 MG PO TBEC: 20.0000 mg | DELAYED_RELEASE_TABLET | Freq: Every day | ORAL | 1 refills | Status: DC

## 2021-12-12 NOTE — Patient Instructions (Signed)
It was a pleasure to see you today.  Thank you for giving us the opportunity to be involved in your care.  Below is a brief recap of your visit and next steps.  We will plan to see you again in 3 months.  Summary No medication changes today. Your blood pressure looks great. We will follow up in 3 months  

## 2021-12-12 NOTE — Progress Notes (Signed)
Established Patient Office Visit  Subjective   Patient ID: Don Fletcher, male    DOB: 22-Jul-1970  Age: 51 y.o. MRN: [id number]  Chief Complaint  Patient presents with   Hypertension    4 week f/u    Don Fletcher returns to care today.  He is a 51 year old male with a past medical history significant for CAD s/p multiple PCI's, PAD, ischemic cardiomyopathy, HLD, HTN, BPD 1, PUD, schizophrenic disorder, and tobacco use.  He was last seen by me on 11/10/21 at which time benazepril was increased to 40 mg daily due to uncontrolled HTN.  4-week follow-up was arranged for BP check.  He also endorsed left anterior and lateral shoulder pain and was provided with home PT exercises.  In the interim, he has establish care with psychiatry and has been seen by cardiology for preop cardiac risk stratification in the setting of upcoming bilateral hip surgery.  A nuclear stress test was ordered.  Today Don Fletcher states that he feels well.  He feels as though his left shoulder pain is improving.  He was referred by cardiology to pharmacy to start PCSK9 therapy.  His only symptom today is persistent symptoms of GERD.  [name] has been taking omeprazole 40 mg daily, which was initially effective in relieving his symptoms but no longer seems to work.  He is interested in trying a different medication today.  Acute concerns, chronic medical conditions, and outstanding preventative care items discussed today are individually addressed in A/P below.  Past Medical History:  Diagnosis Date   Bipolar 1 disorder ([place])    CAD (coronary artery disease)    a. 12/16/13 inferolat STEMI s/p BMS to RPL. b. Cath 08/2014: interim occlusion of the PLA stent of the RCA, collateralized from the left coronary artery with diffuse nonobstructive CAD;  c. STEMI/PCI: LM nl, LAD nl, LCX 181m/d (2.75x12 synergy DES), RCA 30p, RPDA 75ost/p, RPAV 100 w/ L->R collats.   Chronic back pain    HLD (hyperlipidemia)    Hypertension    Ischemic  cardiomyopathy    a. 12/2013: EF 50-55% by echo with +WMA. b. EF 45% by cath in 08/2014.   Ischemic cardiomyopathy 05/19/2016   Peptic ulcer disease    Schizophrenic disorder ([place])    Tobacco abuse    Past Surgical History:  Procedure Laterality Date   ABDOMINAL AORTOGRAM W/LOWER EXTREMITY Bilateral 09/09/2021   Procedure: ABDOMINAL AORTOGRAM W/LOWER EXTREMITY;  Surgeon: Angelia Mould, MD;  Location: [place] [facility];  Service: Cardiovascular;  Laterality: Bilateral;   CARDIAC CATHETERIZATION N/A 08/07/2014   Procedure: Left Heart Cath and Coronary Angiography;  Surgeon: Sherren Mocha, MD;  Location: [place] [facility];  Service: Cardiovascular;  Laterality: N/A;   COLONOSCOPY N/A 06/06/2017   Procedure: COLONOSCOPY;  Surgeon: Rogene Houston, MD;  [facility];  Service: Endoscopy;  Laterality: N/A;  10:50   CORONARY STENT INTERVENTION N/A 05/19/2016   Procedure: Coronary Stent Intervention;  Surgeon: Lorretta Harp, MD;  Location: [place] [facility];  Service: Cardiovascular;  Laterality: N/A;   CORONARY STENT INTERVENTION N/A 07/02/2018   Procedure: CORONARY STENT INTERVENTION;  Surgeon: Belva Crome, MD;  Location: [place] [facility];  Service: Cardiovascular;  Laterality: N/A;   HERNIA REPAIR     LEFT HEART CATH AND CORONARY ANGIOGRAPHY N/A 05/19/2016   Procedure: Left Heart Cath and Coronary Angiography;  Surgeon: Lorretta Harp, MD;  Location: [place] [facility];  Service: Cardiovascular;  Laterality:  N/A;   LEFT HEART CATH AND CORONARY ANGIOGRAPHY N/A 07/02/2018   Procedure: LEFT HEART CATH AND CORONARY ANGIOGRAPHY;  Surgeon: Belva Crome, MD;  Location: [place] [facility];  Service: Cardiovascular;  Laterality: N/A;   LEFT HEART CATHETERIZATION WITH CORONARY ANGIOGRAM N/A 12/16/2013   Procedure: LEFT HEART CATHETERIZATION WITH CORONARY ANGIOGRAM;  Surgeon: Jettie Booze, MD;  Location: [facility] LAB;  Service: Cardiovascular;  Laterality: N/A;   PERCUTANEOUS  CORONARY STENT INTERVENTION (PCI-S)  12/16/2013   Procedure: PERCUTANEOUS CORONARY STENT INTERVENTION (PCI-S);  Surgeon: Jettie Booze, MD;  Location: [facility] C[facility] LAB;  Service: Cardiovascular;;  distal rca   PERIPHERAL VASCULAR BALLOON ANGIOPLASTY  09/09/2021   Procedure: PERIPHERAL VASCULAR BALLOON ANGIOPLASTY;  Surgeon: Angelia Mould, MD;  Location: [place] [facility];  Service: Cardiovascular;;  Rt SFA   PERIPHERAL VASCULAR INTERVENTION  09/09/2021   Procedure: PERIPHERAL VASCULAR INTERVENTION;  Surgeon: Angelia Mould, MD;  Location: [place] [facility];  Service: Cardiovascular;;  Rt Iliac   Social History   Tobacco Use   Smoking status: Every Day    Packs/day: 1.00    Years: 25.00    Total pack years: 25.00    Types: Cigarettes, Cigars    Start date: 05/25/1988   Smokeless tobacco: Former    Types: Snuff    Quit date: 05/21/1988  Vaping Use   Vaping Use: Never used  Substance Use Topics   Alcohol use: No    Alcohol/week: 0.0 standard drinks of alcohol   Drug use: Yes    Types: Marijuana    Comment: last used today   Family History  Problem Relation Age of Onset   Heart attack Father 32   Heart attack Brother 40   Allergies  Allergen Reactions   Suboxone [Buprenorphine Hcl-Naloxone Hcl] Other (See Comments)    Caused kidney failure   Benadryl [Diphenhydramine] Other (See Comments)    LEG CRAMPING   Seroquel [Quetiapine] Other (See Comments)    "makes my legs cramp"   Tylenol Pm Extra [Diphenhydramine-Apap (Sleep)] Other (See Comments)    "makes my leg cramp"   Review of Systems  Gastrointestinal:  Positive for heartburn.  Musculoskeletal:  Positive for joint pain (Left shoulder pain (improving)).  All other systems reviewed and are negative.    Objective:     BP 118/81 (BP Location: Left Arm, Patient Position: Sitting, Cuff Size: Large)   Pulse 61   Ht 5\' 9"  (1.753 m)   Wt 177 lb 6.4 oz (80.5 kg)   SpO2 97%   BMI 26.20 kg/m  BP Readings  from Last 3 Encounters:  12/12/21 118/81  11/30/21 130/82  11/10/21 (!) 140/86   Physical Exam Constitutional:      General: He is not in acute distress.    Appearance: He is not toxic-appearing.     Comments: Appears older than stated age  HENT:     Head: Normocephalic and atraumatic.     Right Ear: External ear normal.     Left Ear: External ear normal.     Nose: Nose normal. No congestion.     Mouth/Throat:     Mouth: Mucous membranes are moist.     Pharynx: Oropharynx is clear. No oropharyngeal exudate or posterior oropharyngeal erythema.  Eyes:     Extraocular Movements: Extraocular movements intact.     Conjunctiva/sclera: Conjunctivae normal.     Pupils: Pupils are equal, round, and reactive to light.  Cardiovascular:     Rate and Rhythm:  Normal rate and regular rhythm.     Heart sounds: Murmur (3/6 systolic murmur) heard.  Pulmonary:     Effort: Pulmonary effort is normal.     Breath sounds: Normal breath sounds. No wheezing, rhonchi or rales.  Abdominal:     General: Abdomen is flat. Bowel sounds are normal. There is no distension.     Palpations: Abdomen is soft.     Tenderness: There is no abdominal tenderness.  Musculoskeletal:        General: Tenderness (Mild TTP over left bicipital groove) present.     Comments: IR and abduction of left shoulder improving compared to exam 1 month ago  Skin:    General: Skin is warm and dry.     Capillary Refill: Capillary refill takes less than 2 seconds.     Coloration: Skin is not jaundiced.  Neurological:     General: No focal deficit present.     Mental Status: He is alert.     Gait: Gait abnormal (Antalgic gait).    Last CBC Lab Results  Component Value Date   WBC 9.4 07/23/2020   HGB 16.7 09/09/2021   HCT 49.0 09/09/2021   MCV 93.6 07/23/2020   MCH 31.8 07/23/2020   RDW 13.2 07/23/2020   PLT 231 [id number]   Last metabolic panel Lab Results  Component Value Date   GLUCOSE 117 (H) 09/09/2021   NA 138  09/09/2021   K 5.6 (H) 09/09/2021   CL 106 09/09/2021   CO2 23 07/23/2020   BUN 13 09/09/2021   CREATININE 0.90 09/09/2021   GFRNONAA >60 07/23/2020   CALCIUM 8.9 07/23/2020   PROT 7.0 07/23/2020   ALBUMIN 3.8 07/23/2020   BILITOT 0.5 07/23/2020   ALKPHOS 81 07/23/2020   AST 24 07/23/2020   ALT 28 07/23/2020   ANIONGAP 8 07/23/2020   Last lipids Lab Results  Component Value Date   CHOL 219 (H) 09/10/2021   HDL 25 (L) 09/10/2021   LDLCALC 168 (H) 09/10/2021   TRIG 128 09/10/2021   CHOLHDL 8.8 09/10/2021   Last hemoglobin A1c Lab Results  Component Value Date   HGBA1C 5.6 09/29/2021   Last thyroid functions Lab Results  Component Value Date   TSH 1.310 09/29/2021   Last vitamin B12 and Folate Lab Results  Component Value Date   [id number] 09/29/2021   FOLATE 2.5 (L) 09/29/2021     Assessment & Plan:   Problem List Items Addressed This Visit       Hypertension - Primary (Chronic)    BP 118/81 today.  He is currently prescribed carvedilol 6.25 mg twice daily and benazepril 40 mg daily.  Benazepril was increased at his last appointment. -No medication changes today.  Continue current regimen.      Lung nodule seen on imaging study    Lung RADS 3 right lung nodule identified on LDCT performed in late August.  Repeat scan in 6 months recommended.  This will be completed in February.      GERD (gastroesophageal reflux disease)    He endorses persistent reflux symptoms today despite treatment with omeprazole.  His symptoms are daily worse when laying down.  We will switch to Protonix today.  If this does not improve his symptoms, consider referral to GI for further work-up.      HLD (hyperlipidemia) (Chronic)    LDL remains significantly elevated despite high intensity statin therapy.  He has been referred by cardiology to pharmacy to discuss starting PCSK9 inhibitor therapy.  Return in about 3 months (around 03/14/2022).    Johnette Abraham, MD

## 2021-12-15 ENCOUNTER — Telehealth: Payer: Self-pay | Admitting: Internal Medicine

## 2021-12-15 NOTE — Telephone Encounter (Signed)
Follow Up:      Patient is calling back to find out his Stress Test results from last week.

## 2021-12-15 NOTE — Telephone Encounter (Signed)
Will fwd to provider for results.  

## 2021-12-16 ENCOUNTER — Telehealth (INDEPENDENT_AMBULATORY_CARE_PROVIDER_SITE_OTHER): Payer: Self-pay | Admitting: Internal Medicine

## 2021-12-16 DIAGNOSIS — R911 Solitary pulmonary nodule: Secondary | ICD-10-CM | POA: Insufficient documentation

## 2021-12-16 DIAGNOSIS — R079 Chest pain, unspecified: Secondary | ICD-10-CM

## 2021-12-16 DIAGNOSIS — Z01818 Encounter for other preprocedural examination: Secondary | ICD-10-CM

## 2021-12-16 DIAGNOSIS — K219 Gastro-esophageal reflux disease without esophagitis: Secondary | ICD-10-CM | POA: Insufficient documentation

## 2021-12-16 NOTE — Assessment & Plan Note (Signed)
Lung RADS 3 right lung nodule identified on LDCT performed in late August.  Repeat scan in 6 months recommended.  This will be completed in February.

## 2021-12-16 NOTE — Assessment & Plan Note (Signed)
He endorses persistent reflux symptoms today despite treatment with omeprazole.  His symptoms are daily worse when laying down.  We will switch to Protonix today.  If this does not improve his symptoms, consider referral to GI for further work-up.

## 2021-12-16 NOTE — Assessment & Plan Note (Signed)
BP 118/81 today.  He is currently prescribed carvedilol 6.25 mg twice daily and benazepril 40 mg daily.  Benazepril was increased at his last appointment. -No medication changes today.  Continue current regimen.

## 2021-12-16 NOTE — Telephone Encounter (Signed)
Patient is scheduled for LHC with Dr. Herbie Baltimore on 11/15 @ 10 am.

## 2021-12-16 NOTE — Telephone Encounter (Signed)
I connected with  Don Fletcher on 12/16/21 by telephone call and verified that I am speaking with the correct person using two identifiers. I discussed the limitations of evaluation and management by telemedicine. The patient expressed understanding and agreed to proceed.   Nuclear stress test showed peri-infarct ischemia (small to intermediate) in RCA distribution. He will benefit from [facility] prior to bilateral hip replacement to prevent peri-operative MI. Risks and benefits of cardiac catheterization have been discussed with the patient. These include bleeding, infection, kidney damage, stroke, heart attack, death. The patient understands these risks and is willing to proceed.  [name] Verne Spurr, MD Cardiology

## 2021-12-16 NOTE — Assessment & Plan Note (Signed)
LDL remains significantly elevated despite high intensity statin therapy.  He has been referred by cardiology to pharmacy to discuss starting PCSK9 inhibitor therapy.

## 2021-12-19 ENCOUNTER — Other Ambulatory Visit (HOSPITAL_COMMUNITY)
Admission: RE | Admit: 2021-12-19 | Discharge: 2021-12-19 | Disposition: A | Discharge status: Home / Self Care | Payer: Medicaid Other | Source: Ambulatory Visit | Attending: Internal Medicine | Admitting: Internal Medicine

## 2021-12-19 DIAGNOSIS — Z01818 Encounter for other preprocedural examination: Secondary | ICD-10-CM | POA: Diagnosis present

## 2021-12-19 LAB — CBC
HCT: 48.5 % (ref 39.0–52.0)
Hemoglobin: 17.2 g/dL — ABNORMAL HIGH (ref 13.0–17.0)
MCH: 32.1 pg (ref 26.0–34.0)
MCHC: 35.5 g/dL (ref 30.0–36.0)
MCV: 90.5 fL (ref 80.0–100.0)
Platelets: 230 10*3/uL (ref 150–400)
RBC: 5.36 MIL/uL (ref 4.22–5.81)
RDW: 12.8 % (ref 11.5–15.5)
WBC: 7.6 10*3/uL (ref 4.0–10.5)
nRBC: 0 % (ref 0.0–0.2)

## 2021-12-19 LAB — BASIC METABOLIC PANEL
Anion gap: 5 (ref 5–15)
BUN: 12 mg/dL (ref 6–20)
CO2: 23 mmol/L (ref 22–32)
Calcium: 8.8 mg/dL — ABNORMAL LOW (ref 8.9–10.3)
Chloride: 108 mmol/L (ref 98–111)
Creatinine, Ser: 1.08 mg/dL (ref 0.61–1.24)
GFR, Estimated: >60 mL/min (ref >60)
Glucose, Bld: 133 mg/dL — ABNORMAL HIGH (ref 70–99)
Potassium: 4.5 mmol/L (ref 3.5–5.1)
Sodium: 136 mmol/L (ref 135–145)

## 2021-12-20 ENCOUNTER — Telehealth: Payer: Self-pay | Admitting: *Deleted

## 2021-12-20 ENCOUNTER — Ambulatory Visit (HOSPITAL_BASED_OUTPATIENT_CLINIC_OR_DEPARTMENT_OTHER): Payer: Medicaid Other | Admitting: Student in an Organized Health Care Education/Training Program

## 2021-12-20 ENCOUNTER — Encounter (HOSPITAL_COMMUNITY): Payer: Self-pay | Admitting: Student in an Organized Health Care Education/Training Program

## 2021-12-20 VITALS — BP 137/85 | HR 78 | Ht 67.0 in | Wt 177.0 lb

## 2021-12-20 DIAGNOSIS — F4310 Post-traumatic stress disorder, unspecified: Secondary | ICD-10-CM | POA: Diagnosis not present

## 2021-12-20 DIAGNOSIS — F339 Major depressive disorder, recurrent, unspecified: Secondary | ICD-10-CM

## 2021-12-20 DIAGNOSIS — F411 Generalized anxiety disorder: Secondary | ICD-10-CM | POA: Diagnosis not present

## 2021-12-20 DIAGNOSIS — G4700 Insomnia, unspecified: Secondary | ICD-10-CM

## 2021-12-20 DIAGNOSIS — F209 Schizophrenia, unspecified: Secondary | ICD-10-CM | POA: Diagnosis not present

## 2021-12-20 MED ORDER — MIRTAZAPINE 15 MG PO TABS: 15.0000 mg | ORAL_TABLET | Freq: Every day | ORAL | 1 refills | Status: DC

## 2021-12-20 MED ORDER — HYDROXYZINE HCL 25 MG PO TABS: 25.0000 mg | ORAL_TABLET | Freq: Three times a day (TID) | ORAL | 0 refills | Status: DC | PRN

## 2021-12-20 MED ORDER — RISPERIDONE 1 MG PO TABS: 1.0000 mg | ORAL_TABLET | Freq: Two times a day (BID) | ORAL | 1 refills | Status: DC

## 2021-12-20 NOTE — Telephone Encounter (Addendum)
Cardiac Catheterization scheduled at [facility] for: Wednesday December 21, 2021 10 AM Arrival time and place: [facility] Main Entrance A at: 8 AM  Nothing to eat after midnight prior to procedure, clear liquids until 5 AM day of procedure.  Medication instructions: -Usual morning medications can be taken with sips of water including aspirin 81 mg and Plavix 75 mg.  Confirmed patient has responsible adult to drive home post procedure and be with patient first 24 hours after arriving home.  Patient reports no new symptoms concerning for COVID-19 in the past 10 days.  Reviewed procedure instructions with patient.

## 2021-12-20 NOTE — Progress Notes (Signed)
BH MD/PA/NP OP Progress Note  12/20/2021 4:10 PM Don Fletcher  MRN:  [id number]  Chief Complaint:  Chief Complaint  Patient presents with   Follow-up   Anxiety   Depression   HPI:  Don Fletcher is a 51 yr old male who presents for follow up and for medication management.  PPHx is significant for Depression, Anxiety, and Schizophrenia, multiple Suicide Attempts (Hanging, OD, put gun in his mouth, last ~10 yrs ago), and multiple Hospitalizations ([facility], [facility] in [place], last ~10 yrs ago), and no history of Self Injurious Behavior.  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    He reports he is very anxious today.  He reports that this is due to to needing to have another cardiac stent done tomorrow.  He reports that he was doing a stress test and they found a blockage.  He reports that he hates doing these and that it always seems like it has 1 thing after another going wrong but he knows he needs to do this.  He reports that he has noticed mild improvement in his mood.  He reports he has noticed some improvement in his appetite.  He reports no side effects from starting the medications.  Discussed further increasing his Risperdal and Remeron to control his symptoms and he was agreeable to this.  He then asked for medication for his anxiety because his "nerves are shot."  Discussed hydroxyzine and he was agreeable to a trial of it.  Discussed the possibility of a PTSD support group because he still has the image of his friend shooting himself and him being covered with blood and brain matter.  He reports he knows he did not pull the trigger but that he did pull the trigger by giving the friend of the dope that led to that.  He reports that strangers make him nervous but he will consider it.  He reports no SI, HI, or AVH.  He reports his sleep is poor.  He reports his appetite is improving.  He reports no other concerns at  present.  He will return for follow-up in approximately 4 weeks.    Visit Diagnosis:    ICD-10-CM   1. PTSD (post-traumatic stress disorder)  F43.10 hydrOXYzine (ATARAX) 25 MG tablet    2. Schizophrenic disorder (HCC)  F20.9 risperiDONE (RISPERDAL) 1 MG tablet    3. GAD (generalized anxiety disorder)  F41.1 risperiDONE (RISPERDAL) 1 MG tablet    mirtazapine (REMERON) 15 MG tablet    hydrOXYzine (ATARAX) 25 MG tablet    4. Episode of recurrent major depressive disorder, unspecified depression episode severity (HCC)  F33.9 risperiDONE (RISPERDAL) 1 MG tablet    mirtazapine (REMERON) 15 MG tablet    5. Insomnia, unspecified type  G47.00 mirtazapine (REMERON) 15 MG tablet      Past Psychiatric History: Depression, Anxiety, and Schizophrenia, multiple Suicide Attempts (Hanging, OD, put gun in his mouth, last ~10 yrs ago), and multiple Hospitalizations ([facility], [facility] in [place], last ~10 yrs ago), and no history of Self Injurious Behavior.    Past Medical History:  Past Medical History:  Diagnosis Date   Bipolar 1 disorder (HCC)    CAD (coronary artery disease)    a. 12/16/13 inferolat STEMI s/p BMS to RPL. b. Cath 08/2014: interim occlusion of the PLA stent of the RCA, collateralized from the left coronary artery with diffuse nonobstructive CAD;  c. STEMI/PCI:  LM nl, LAD nl, LCX 141m/d (2.75x12 synergy DES), RCA 30p, RPDA 75ost/p, RPAV 100 w/ L->R collats.   Chronic back pain    HLD (hyperlipidemia)    Hypertension    Ischemic cardiomyopathy    a. 12/2013: EF 50-55% by echo with +WMA. b. EF 45% by cath in 08/2014.   Ischemic cardiomyopathy 05/19/2016   Peptic ulcer disease    Schizophrenic disorder (HCC)    Tobacco abuse     Past Surgical History:  Procedure Laterality Date   ABDOMINAL AORTOGRAM W/LOWER EXTREMITY Bilateral 09/09/2021   Procedure: ABDOMINAL AORTOGRAM W/LOWER EXTREMITY;  Surgeon: Chuck Hint, MD;  Location: [facility] INVASIVE CV LAB;  Service:  Cardiovascular;  Laterality: Bilateral;   CARDIAC CATHETERIZATION N/A 08/07/2014   Procedure: Left Heart Cath and Coronary Angiography;  Surgeon: Tonny Bollman, MD;  Location: [facility] INVASIVE CV LAB;  Service: Cardiovascular;  Laterality: N/A;   COLONOSCOPY N/A 06/06/2017   Procedure: COLONOSCOPY;  Surgeon: Malissa Hippo, MD;  Location: [facility] ENDO SUITE;  Service: Endoscopy;  Laterality: N/A;  10:50   CORONARY STENT INTERVENTION N/A 05/19/2016   Procedure: Coronary Stent Intervention;  Surgeon: Runell Gess, MD;  Location: [facility] INVASIVE CV LAB;  Service: Cardiovascular;  Laterality: N/A;   CORONARY STENT INTERVENTION N/A 07/02/2018   Procedure: CORONARY STENT INTERVENTION;  Surgeon: Lyn Records, MD;  Location: [facility] INVASIVE CV LAB;  Service: Cardiovascular;  Laterality: N/A;   HERNIA REPAIR     LEFT HEART CATH AND CORONARY ANGIOGRAPHY N/A 05/19/2016   Procedure: Left Heart Cath and Coronary Angiography;  Surgeon: Runell Gess, MD;  Location: [facility] INVASIVE CV LAB;  Service: Cardiovascular;  Laterality: N/A;   LEFT HEART CATH AND CORONARY ANGIOGRAPHY N/A 07/02/2018   Procedure: LEFT HEART CATH AND CORONARY ANGIOGRAPHY;  Surgeon: Lyn Records, MD;  Location: [facility] INVASIVE CV LAB;  Service: Cardiovascular;  Laterality: N/A;   LEFT HEART CATHETERIZATION WITH CORONARY ANGIOGRAM N/A 12/16/2013   Procedure: LEFT HEART CATHETERIZATION WITH CORONARY ANGIOGRAM;  Surgeon: Corky Crafts, MD;  Location: [facility] CATH LAB;  Service: Cardiovascular;  Laterality: N/A;   PERCUTANEOUS CORONARY STENT INTERVENTION (PCI-S)  12/16/2013   Procedure: PERCUTANEOUS CORONARY STENT INTERVENTION (PCI-S);  Surgeon: Corky Crafts, MD;  Location: [facility] CATH LAB;  Service: Cardiovascular;;  distal rca   PERIPHERAL VASCULAR BALLOON ANGIOPLASTY  09/09/2021   Procedure: PERIPHERAL VASCULAR BALLOON ANGIOPLASTY;  Surgeon: Chuck Hint, MD;  Location: [facility] INVASIVE CV LAB;  Service: Cardiovascular;;  Rt SFA   PERIPHERAL VASCULAR  INTERVENTION  09/09/2021   Procedure: PERIPHERAL VASCULAR INTERVENTION;  Surgeon: Chuck Hint, MD;  Location: [facility] INVASIVE CV LAB;  Service: Cardiovascular;;  Rt Iliac    Family Psychiatric History: Sister- Bipolar Disorder Multiple Maternal Aunts- Schizophrenia  Family History:  Family History  Problem Relation Age of Onset   Heart attack Father 32   Heart attack Brother 52    Social History:  Social History   Socioeconomic History   Marital status: Married    Spouse name: Not on file   Number of children: Not on file   Years of education: Not on file   Highest education level: Not on file  Occupational History   Not on file  Tobacco Use   Smoking status: Every Day    Packs/day: 1.00    Years: 25.00    Total pack years: 25.00    Types: Cigarettes, Cigars    Start date: 05/25/1988   Smokeless tobacco: Former    Types: Snuff  Quit date: 05/21/1988  Vaping Use   Vaping Use: Never used  Substance and Sexual Activity   Alcohol use: No    Alcohol/week: 0.0 standard drinks of alcohol   Drug use: Yes    Types: Marijuana    Comment: last used today   Sexual activity: Not on file  Other Topics Concern   Not on file  Social History Narrative   Not on file   Social Determinants of Health   Financial Resource Strain: Not on file  Food Insecurity: Not on file  Transportation Needs: Not on file  Physical Activity: Inactive (06/30/2018)   Exercise Vital Sign    Days of Exercise per Week: 0 days    Minutes of Exercise per Session: 0 min  Stress: Stress Concern Present (06/30/2018)   [employer] - Occupational Stress Questionnaire    Feeling of Stress : Rather much  Social Connections: Somewhat Isolated (06/30/2018)   Social Connection and Isolation Panel [NHANES]    Frequency of Communication with Friends and Family: More than three times a week    Frequency of Social Gatherings with Friends and Family: Never    Attends Religious  Services: Never    Database administratorActive Member of Clubs or Organizations: No    Attends BankerClub or Organization Meetings: Never    Marital Status: Married    Allergies:  Allergies  Allergen Reactions   Suboxone [Buprenorphine Hcl-Naloxone Hcl] Other (See Comments)    Caused kidney failure   Benadryl [Diphenhydramine] Other (See Comments)    LEG CRAMPING   Seroquel [Quetiapine] Other (See Comments)    "makes my legs cramp"   Tylenol Pm Extra [Diphenhydramine-Apap (Sleep)] Other (See Comments)    "makes my leg cramp"    Metabolic Disorder Labs: Lab Results  Component Value Date   HGBA1C 5.6 09/29/2021   MPG 108 05/19/2016   MPG 111 05/19/2016   No results found for: "PROLACTIN" Lab Results  Component Value Date   CHOL 219 (H) 09/10/2021   TRIG 128 09/10/2021   HDL 25 (L) 09/10/2021   CHOLHDL 8.8 09/10/2021   VLDL 26 09/10/2021   LDLCALC 168 (H) 09/10/2021   LDLCALC 210 (H) 07/01/2018   Lab Results  Component Value Date   TSH 1.310 09/29/2021   TSH 2.779 05/19/2016    Therapeutic Level Labs: No results found for: "LITHIUM" Lab Results  Component Value Date   VALPROATE 73.0 07/14/2009   No results found for: "CBMZ"  Current Medications: Current Outpatient Medications  Medication Sig Dispense Refill   aspirin EC 81 MG tablet Take 1 tablet (81 mg total) by mouth daily. Swallow whole. 150 tablet 2   atorvastatin (LIPITOR) 80 MG tablet TAKE 1 TABLET ONCE DAILY. (Patient taking differently: Take 80 mg by mouth at bedtime.) 30 tablet 6   benazepril (LOTENSIN) 40 MG tablet Take 1 tablet (40 mg total) by mouth daily. 90 tablet 3   carvedilol (COREG) 6.25 MG tablet Take 1 tablet (6.25 mg total) by mouth 2 (two) times daily with a meal. 60 tablet 3   clopidogrel (PLAVIX) 75 MG tablet Take 1 tablet (75 mg total) by mouth daily. 30 tablet 11   folic acid (FOLVITE) 1 MG tablet Take 1 tablet (1 mg total) by mouth daily. 90 tablet 1   hydrOXYzine (ATARAX) 25 MG tablet Take 1 tablet (25 mg  total) by mouth 3 (three) times daily as needed. 45 tablet 0   omeprazole (PRILOSEC) 40 MG capsule Take 40 mg by mouth  daily.     pantoprazole (PROTONIX) 20 MG tablet Take 1 tablet (20 mg total) by mouth daily. 90 tablet 1   mirtazapine (REMERON) 15 MG tablet Take 1 tablet (15 mg total) by mouth at bedtime. 30 tablet 1   risperiDONE (RISPERDAL) 1 MG tablet Take 1 tablet (1 mg total) by mouth 2 (two) times daily. 60 tablet 1   No current facility-administered medications for this visit.     Musculoskeletal: Strength & Muscle Tone: within normal limits Gait & Station:  has a limp Patient leans: N/A  Psychiatric Specialty Exam: Review of Systems  Respiratory:  Negative for cough and shortness of breath.   Cardiovascular:  Negative for chest pain.  Gastrointestinal:  Negative for abdominal pain, constipation, diarrhea, nausea and vomiting.  Neurological:  Negative for weakness and headaches.  Psychiatric/Behavioral:  Positive for dysphoric mood and sleep disturbance. Negative for hallucinations and suicidal ideas. The patient is nervous/anxious.     Blood pressure 137/85, pulse 78, height  (1.702 m), weight 177 lb (80.3 kg).Body mass index is 27.72 kg/m.  General Appearance: Casual  Eye Contact:  Good  Speech:  Clear and Coherent and Normal Rate  Volume:  Normal  Mood:  Anxious and Depressed  Affect:  Congruent and Constricted  Thought Process:  Coherent and Goal Directed  Orientation:  Full (Time, Place, and Person)  Thought Content: WDL and Logical   Suicidal Thoughts:  No  Homicidal Thoughts:  No  Memory:  Immediate;   Good Recent;   Good  Judgement:  Good  Insight:  Good  Psychomotor Activity:  Normal  Concentration:  Concentration: Good and Attention Span: Good  Recall:  Good  Fund of Knowledge: Good  Language: Good  Akathisia:  Negative  Handed:  Right  AIMS (if indicated): not done  Assets:  Communication Skills Desire for  Improvement Housing Resilience Social Support  ADL's:  Intact  Cognition: WNL  Sleep:  Poor   Screenings: PHQ2-9    Flowsheet Row Office Visit from 12/12/2021 in [facility] Office Visit from 11/14/2021 in [facility] Office Visit from 09/29/2021 in [facility]  PHQ-2 Total Score PHQ-9 Total Score Flowsheet Row Office Visit from 09/29/2021 in [facility] Admission (Discharged) from 09/09/2021 in [facility] 4E CV SURGICAL PROGRESSIVE CARE ED from 07/23/2020 in [facility] EMERGENCY DEPARTMENT  C-SSRS RISK CATEGORY No Risk No Risk No Risk        Assessment and Plan:  Don Fletcher is a 51 yr old male who presents for follow up and for medication management.  PPHx is significant for Depression, Anxiety, and Schizophrenia, multiple Suicide Attempts (Hanging, OD, put gun in his mouth, last ~10 yrs ago), and multiple Hospitalizations ([facility], [facility] in [place], last ~10 yrs ago), and no history of Self Injurious Behavior.  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    Don Fletcher has had some improvement with medications but also had additional stress of needing another Heart Cath which will be done tomorrow.  When asked about AVH he reports that he still sees his friend shooting himself but not seeing shadows so it does appear that his psychotic symptoms are being controlled but his PTSD is still very significant.  We will increase his Risperdal and Remeron to better control his symptoms.  We will also start Hydroxyzine  for acute anxiety.  He will return for follow up in approximately 4 weeks.   Schizophrenia  GAD  PTSD  MDD: -Increase Risperdal to 1 mg BID for psychosis, anger issues, and augmentation.  60 tablets with 1 refill. -Increase Remeron 15 mg QHS for depression, anxiety, insomnia, and appetite.  30 tablets with 1  refill. -Start Hydroxyzine 25 mg TID PRN.  45 tablets with 0 refills.   Collaboration of Care:   Patient/Guardian was advised Release of Information must be obtained prior to any record release in order to collaborate their care with an outside provider. Patient/Guardian was advised if they have not already done so to contact the registration department to sign all necessary forms in order for [place] to release information regarding their care.   Consent: Patient/Guardian gives verbal consent for treatment and assignment of benefits for services provided during this visit. Patient/Guardian expressed understanding and agreed to proceed.    Lauro Franklin, MD 12/20/2021, 4:10 PM

## 2021-12-21 ENCOUNTER — Ambulatory Visit (HOSPITAL_COMMUNITY)
Admission: RE | Admit: 2021-12-21 | Discharge: 2021-12-21 | Disposition: A | Discharge status: Home / Self Care | Payer: Medicaid Other | Source: Ambulatory Visit | Attending: Cardiology | Admitting: Cardiology

## 2021-12-21 ENCOUNTER — Encounter (HOSPITAL_COMMUNITY)
Admission: RE | Disposition: A | Discharge status: Home / Self Care | Payer: Self-pay | Source: Ambulatory Visit | Attending: Cardiology

## 2021-12-21 ENCOUNTER — Other Ambulatory Visit: Payer: Self-pay

## 2021-12-21 DIAGNOSIS — I2510 Atherosclerotic heart disease of native coronary artery without angina pectoris: Secondary | ICD-10-CM | POA: Diagnosis not present

## 2021-12-21 DIAGNOSIS — I2582 Chronic total occlusion of coronary artery: Secondary | ICD-10-CM | POA: Insufficient documentation

## 2021-12-21 DIAGNOSIS — Z955 Presence of coronary angioplasty implant and graft: Secondary | ICD-10-CM | POA: Diagnosis not present

## 2021-12-21 DIAGNOSIS — Z79899 Other long term (current) drug therapy: Secondary | ICD-10-CM | POA: Insufficient documentation

## 2021-12-21 DIAGNOSIS — Z7982 Long term (current) use of aspirin: Secondary | ICD-10-CM | POA: Diagnosis not present

## 2021-12-21 DIAGNOSIS — I10 Essential (primary) hypertension: Secondary | ICD-10-CM | POA: Insufficient documentation

## 2021-12-21 DIAGNOSIS — F17290 Nicotine dependence, other tobacco product, uncomplicated: Secondary | ICD-10-CM | POA: Diagnosis not present

## 2021-12-21 DIAGNOSIS — R9439 Abnormal result of other cardiovascular function study: Secondary | ICD-10-CM

## 2021-12-21 DIAGNOSIS — I252 Old myocardial infarction: Secondary | ICD-10-CM | POA: Diagnosis not present

## 2021-12-21 DIAGNOSIS — E785 Hyperlipidemia, unspecified: Secondary | ICD-10-CM | POA: Diagnosis not present

## 2021-12-21 DIAGNOSIS — Z7902 Long term (current) use of antithrombotics/antiplatelets: Secondary | ICD-10-CM | POA: Diagnosis not present

## 2021-12-21 DIAGNOSIS — Z9861 Coronary angioplasty status: Secondary | ICD-10-CM

## 2021-12-21 DIAGNOSIS — Z01810 Encounter for preprocedural cardiovascular examination: Secondary | ICD-10-CM

## 2021-12-21 HISTORY — PX: LEFT HEART CATH AND CORONARY ANGIOGRAPHY: CATH118249

## 2021-12-21 SURGERY — LEFT HEART CATH AND CORONARY ANGIOGRAPHY
Anesthesia: LOCAL

## 2021-12-21 MED ORDER — HEPARIN SODIUM (PORCINE) 1000 UNIT/ML IJ SOLN
INTRAMUSCULAR | Status: AC
  Filled 2021-12-21: qty 10

## 2021-12-21 MED ORDER — MIDAZOLAM HCL 2 MG/2ML IJ SOLN
INTRAMUSCULAR | Status: DC | PRN
  Administered 2021-12-21: 2 mg via INTRAVENOUS

## 2021-12-21 MED ORDER — VERAPAMIL HCL 2.5 MG/ML IV SOLN
INTRAVENOUS | Status: DC | PRN
  Administered 2021-12-21: 10 mL via INTRA_ARTERIAL

## 2021-12-21 MED ORDER — IOHEXOL 350 MG/ML SOLN
INTRAVENOUS | Status: DC | PRN
  Administered 2021-12-21: 70 mL via INTRA_ARTERIAL

## 2021-12-21 MED ORDER — LIDOCAINE HCL (PF) 1 % IJ SOLN
INTRAMUSCULAR | Status: DC | PRN
  Administered 2021-12-21: 2 mL

## 2021-12-21 MED ORDER — LABETALOL HCL 5 MG/ML IV SOLN: 10.0000 mg | INTRAVENOUS | Status: DC | PRN

## 2021-12-21 MED ORDER — SODIUM CHLORIDE 0.9 % IV SOLN: 250.0000 mL | INTRAVENOUS | Status: DC | PRN

## 2021-12-21 MED ORDER — SODIUM CHLORIDE 0.9% FLUSH: 3.0000 mL | INTRAVENOUS | Status: DC | PRN

## 2021-12-21 MED ORDER — SODIUM CHLORIDE 0.9 % WEIGHT BASED INFUSION: 1.0000 mL/kg/h | INTRAVENOUS | Status: DC

## 2021-12-21 MED ORDER — LIDOCAINE HCL (PF) 1 % IJ SOLN
INTRAMUSCULAR | Status: AC
  Filled 2021-12-21: qty 30

## 2021-12-21 MED ORDER — HEPARIN SODIUM (PORCINE) 1000 UNIT/ML IJ SOLN
INTRAMUSCULAR | Status: DC | PRN
  Administered 2021-12-21: 4000 [IU] via INTRAVENOUS

## 2021-12-21 MED ORDER — HEPARIN (PORCINE) IN NACL 1000-0.9 UT/500ML-% IV SOLN
INTRAVENOUS | Status: AC
  Filled 2021-12-21: qty 1000

## 2021-12-21 MED ORDER — MIDAZOLAM HCL 2 MG/2ML IJ SOLN
INTRAMUSCULAR | Status: AC
  Filled 2021-12-21: qty 2

## 2021-12-21 MED ORDER — HEPARIN (PORCINE) IN NACL 1000-0.9 UT/500ML-% IV SOLN
INTRAVENOUS | Status: DC | PRN
  Administered 2021-12-21 (×2): 500 mL

## 2021-12-21 MED ORDER — FENTANYL CITRATE (PF) 100 MCG/2ML IJ SOLN
INTRAMUSCULAR | Status: DC | PRN
  Administered 2021-12-21: 25 ug via INTRAVENOUS

## 2021-12-21 MED ORDER — ONDANSETRON HCL 4 MG/2ML IJ SOLN: 4.0000 mg | Freq: Four times a day (QID) | INTRAMUSCULAR | Status: DC | PRN

## 2021-12-21 MED ORDER — FENTANYL CITRATE (PF) 100 MCG/2ML IJ SOLN
INTRAMUSCULAR | Status: AC
  Filled 2021-12-21: qty 2

## 2021-12-21 MED ORDER — ASPIRIN 81 MG PO CHEW: 81.0000 mg | CHEWABLE_TABLET | ORAL | Status: DC

## 2021-12-21 MED ORDER — SODIUM CHLORIDE 0.9 % WEIGHT BASED INFUSION
3.0000 mL/kg/h | INTRAVENOUS | Status: AC
  Administered 2021-12-21: 3 mL/kg/h via INTRAVENOUS

## 2021-12-21 MED ORDER — HYDRALAZINE HCL 20 MG/ML IJ SOLN: 10.0000 mg | INTRAMUSCULAR | Status: DC | PRN

## 2021-12-21 MED ORDER — SODIUM CHLORIDE 0.9% FLUSH: 3.0000 mL | Freq: Two times a day (BID) | INTRAVENOUS | Status: DC

## 2021-12-21 MED ORDER — VERAPAMIL HCL 2.5 MG/ML IV SOLN
INTRAVENOUS | Status: AC
  Filled 2021-12-21: qty 2

## 2021-12-21 SURGICAL SUPPLY — 11 items
CATH OPTITORQUE TIG 4.0 5F (CATHETERS) IMPLANT
DEVICE RAD COMP TR BAND LRG (VASCULAR PRODUCTS) IMPLANT
GLIDESHEATH SLEND SS 6F .021 (SHEATH) IMPLANT
GUIDEWIRE INQWIRE 1.5J.035X260 (WIRE) IMPLANT
INQWIRE 1.5J .035X260CM (WIRE) ×1
KIT HEART LEFT (KITS) ×1 IMPLANT
PACK CARDIAC CATHETERIZATION (CUSTOM PROCEDURE TRAY) ×1 IMPLANT
SHEATH PROBE COVER 6X72 (BAG) IMPLANT
SYR MEDRAD MARK 7 150ML (SYRINGE) ×1 IMPLANT
TRANSDUCER W/STOPCOCK (MISCELLANEOUS) ×1 IMPLANT
TUBING CIL FLEX 10 FLL-RA (TUBING) ×1 IMPLANT

## 2021-12-21 NOTE — Interval H&P Note (Signed)
History and Physical Interval Note:  12/21/2021 10:42 AM  Don Fletcher  has presented today for surgery, with the diagnosis of abnormal nuclear stress test done as part of preop evaluation for hip surgery.  Patient unable to complete 4 METS because of hip pain.  Not actively having any angina or CHF symptoms.  Stress test read as intermediate risk because of prior infarct with peri-infarct ischemia and a known occluded RPA V-PL branch fills by collaterals.  Most likely this is the reason for the ischemia.  However he is referred for invasive evaluation based on the stress test results.  I explained to the patient that we will be to find a significant lesion and how to perform PCI and this would then further delay his surgery and he would therefore have to be on uninterrupted Plavix for at least 3 to 6 months before being able to hold it for his surgery.   .  The various methods of treatment have been discussed with the patient and family. After consideration of risks, benefits and other options for treatment, the patient has consented to  Procedure(s): LEFT HEART CATH AND CORONARY ANGIOGRAPHY (N/A)  PERCUTANEOUS CORONARY INTERVENTION   as a surgical intervention.  The patient's history has been reviewed, patient examined, no change in status, stable for surgery.  I have reviewed the patient's chart and labs.  Questions were answered to the patient's satisfaction.    Cath Lab Visit (complete for each Cath Lab visit)  Clinical Evaluation Leading to the Procedure:   ACS: No.  Non-ACS:    Anginal Classification: No Symptoms  Anti-ischemic medical therapy: Maximal Therapy (2 or more classes of medications)  Non-Invasive Test Results: Intermediate-risk stress test findings: cardiac mortality 1-3%/year  Prior CABG: No previous CABG    Bryan Lemma

## 2021-12-21 NOTE — Brief Op Note (Signed)
BRIEF CARDIAC CATHETERIZATION REPORT   NAME: SQUIRE WITHEY   MRN: [id number] 12/21/2021      11:31 AM  PCP:  Johnette Abraham, MD      Cardiologist:  Carlyle Dolly, MD Referring Cardiologist: Dr. Dellia Cloud  PROCEDURE:  Procedure(s): LEFT HEART CATH AND CORONARY ANGIOGRAPHY (N/A)  SURGEON:  Surgeon(s) and Role:    * Leonie Man, MD - Primary   PATIENT:  Don Fletcher is a 51 y.o. male 51 y.o. male known to have CAD manifested by inferolateral STEMI in 2015 s/p BMS to RPL, positive functional study in 2016 s/p RPL ISR with collaterals from LCA, inferolateral STEMI in 2018 s/p LCx PCI, NSTEMI 2020 s/p distal RCA PCI extending into the PDA with LVEF 55%, PAD s/p R EIA stent and R SFA angioplasty in 09/2021 on DAPT, HTN, HLD, tobacco abuse presented to the cardiology clinic for follow-up of CAD.  He follows up with vascular surgery for management of PAD.   He has pending evaluation and planned hip surgery for osteoarthritis.  As part of preop evaluation, he underwent a stress test which showed inferolateral infarction with peri-infarct ischemia.  He therefore was referred for invasive evaluation as part of the completion of his preop evaluation.  PRE-OPERATIVE DIAGNOSIS:  abnormal nuclear stress test  POST-OPERATIVE DIAGNOSIS:   Angiographically stable coronary arteries.  Widely patent LCx stents and distal RCA-PDA stents and known occlusion of the RPA V-PL system with left-to-right collaterals.  The entire RCA is diffusely diseased, but no focal lesion noted. Normal left ventriculogram with normal LVEDP. No reason for the patient not to proceed with surgery.  Would address existing coronary disease with medical management.  In the absence of symptoms, WOULD NOT recommend PCI. Okay to hold antiplatelet agent for surgery 7 days preop   PROCEDURE PERFORMED Time Out: Verified patient identification, verified procedure, site/side was marked, verified correct patient position,  special equipment/implants available, medications/allergies/relevent history reviewed, required imaging and test results available. Performed.  Access:  RIGHT Radial Artery: 6 Fr sheath -- Seldinger technique using Micropuncture Kit -- Direct ultrasound guidance used.  Permanent image obtained and placed on chart. -- 10 mL radial cocktail IA; 4000 Units IV Heparin  Left Heart Catheterization: 5Fr TIG 4.0 Catheters advanced or exchanged over a J-wire under direct fluoroscopic guidance into the ascending aorta; the catheter was advanced across the aortic valve first.  * LV Hemodynamics (LV Gram): TIG 4.0 Catheter * Left & Right Coronary Artery Cineangiography: TIG 4.0 Catheter   Review of initial angiography revealed: Stable CAD with patent LCx & RCA-PDA stents & known CTO of RPAV-PL stent with PL filling via L-R collaterals.  Preparations are made for Clearance for Surgery   Upon completion of Angiogaphy, the catheter was removed completely out of the body over a wire, without complication.  Radial sheath removed in the Cardiac Catheterization lab with TR Band placed for hemostasis.  TR Band: 1115  Hours; 13 mL air  MEDICATIONS SQ Lidocaine 3 mL Radial Cocktail: 3 mg Verapmil in 10 mL NS Heparin: 4000 Units  ANESTHESIA:   local and IV sedation - 53m Versed, 25 mcg Fentanyl  EBL:  <20 mL   DICTATION: .Note written in EPIC  PLAN OF CARE: Discharge to home after PACU - Cleared for Surgery  PATIENT DISPOSITION:  PACU - hemodynamically stable.   Delay start of Pharmacological VTE agent (>24hrs) due to surgical blood loss or risk of bleeding: not applicable    DLeonie Man  MD, MS Glenetta Hew, M.D., M.S. Interventional Cardiologist  [place]  Pager # [phone] Phone # [phone] [address]. [place], [place] [address]

## 2021-12-22 ENCOUNTER — Encounter (HOSPITAL_COMMUNITY): Payer: Self-pay | Admitting: Cardiology

## 2022-01-17 ENCOUNTER — Ambulatory Visit (HOSPITAL_BASED_OUTPATIENT_CLINIC_OR_DEPARTMENT_OTHER): Payer: Medicaid Other | Admitting: Student in an Organized Health Care Education/Training Program

## 2022-01-17 ENCOUNTER — Encounter (HOSPITAL_COMMUNITY): Payer: Self-pay | Admitting: Student in an Organized Health Care Education/Training Program

## 2022-01-17 VITALS — BP 131/85 | HR 72 | Ht 67.0 in | Wt 177.6 lb

## 2022-01-17 DIAGNOSIS — F339 Major depressive disorder, recurrent, unspecified: Secondary | ICD-10-CM | POA: Diagnosis not present

## 2022-01-17 DIAGNOSIS — F4310 Post-traumatic stress disorder, unspecified: Secondary | ICD-10-CM | POA: Diagnosis not present

## 2022-01-17 DIAGNOSIS — F411 Generalized anxiety disorder: Secondary | ICD-10-CM | POA: Diagnosis not present

## 2022-01-17 DIAGNOSIS — G4700 Insomnia, unspecified: Secondary | ICD-10-CM

## 2022-01-17 MED ORDER — MIRTAZAPINE 15 MG PO TABS: 15.0000 mg | ORAL_TABLET | Freq: Every day | ORAL | 1 refills | Status: DC

## 2022-01-17 MED ORDER — HYDROXYZINE HCL 25 MG PO TABS: 25.0000 mg | ORAL_TABLET | Freq: Three times a day (TID) | ORAL | 0 refills | Status: DC | PRN

## 2022-01-17 MED ORDER — OLANZAPINE 5 MG PO TABS: 5.0000 mg | ORAL_TABLET | Freq: Every day | ORAL | 1 refills | Status: DC

## 2022-01-17 NOTE — Progress Notes (Signed)
[facility] MD/PA/NP OP Progress Note  01/17/2022 3:59 PM Don Fletcher  MRN:  [id number]  Chief Complaint:  Chief Complaint  Patient presents with   Follow-up   Depression   Anxiety   Agitation   HPI:  Don Fletcher is a 51 yr old male who presents for follow up and for medication management.  PPHx is significant for Depression, Anxiety, and Schizophrenia, multiple Suicide Attempts (Hanging, OD, put gun in his mouth, last ~10 yrs ago), and multiple Hospitalizations ([facility] in [place], last ~10 yrs ago), and no history of Self Injurious Behavior.  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    He reports that he is still having significant issues with his mood but cannot sleep.  He reports that since our last appointment when he had a heart cath it was discovered that he did not have a blockage and that his stress test had been missed read.  He reports that he was very upset by this going through the risk of the procedure for no reason.  He reports that when he follows up with a cardiologist he will be asking for a different one because he no longer trusts them.  He reports that he did not noticed any improvement with the increase in Risperdal other than potentially a slight 1.  He reports his biggest issue continues to be sleep.  Discussed changing his Risperdal given minimal response to it.  Discussed since he has side effects to Seroquel we could trial Zyprexa as this would be a good mood stabilizer and can boost his appetite which continues to be an issue and to be very sedating.  Discussed that he could also trial doubling the hydroxyzine for anxiety.  He reports no SI, HI, or AVH.  He reports his sleep is poor.  He reports his appetite is poor.  He reports chronic headaches but reports no other concerns at present.  He will return for follow up in approximately 4 weeks.   Visit Diagnosis:    ICD-10-CM   1. PTSD  (post-traumatic stress disorder)  F43.10 hydrOXYzine (ATARAX) 25 MG tablet    OLANZapine (ZYPREXA) 5 MG tablet    2. GAD (generalized anxiety disorder)  F41.1 mirtazapine (REMERON) 15 MG tablet    hydrOXYzine (ATARAX) 25 MG tablet    3. Insomnia, unspecified type  G47.00 mirtazapine (REMERON) 15 MG tablet    OLANZapine (ZYPREXA) 5 MG tablet    4. Episode of recurrent major depressive disorder, unspecified depression episode severity ([place])  F33.9 mirtazapine (REMERON) 15 MG tablet    OLANZapine (ZYPREXA) 5 MG tablet      Past Psychiatric History: Depression, Anxiety, and Schizophrenia, multiple Suicide Attempts (Hanging, OD, put gun in his mouth, last ~10 yrs ago), and multiple Hospitalizations ([place], [facility] in [place], last ~10 yrs ago), and no history of Self Injurious Behavior.     Past Medical History:  Past Medical History:  Diagnosis Date   Bipolar 1 disorder ([place])    CAD (coronary artery disease)    a. 12/16/13 inferolat STEMI s/p BMS to RPL. b. Cath 08/2014: interim occlusion of the PLA stent of the RCA, collateralized from the left coronary artery with diffuse nonobstructive CAD;  c. STEMI/PCI: LM nl, LAD nl, LCX 110m/d (2.75x12 synergy DES), RCA 30p, RPDA 75ost/p, RPAV 100 w/ L->R collats.   Chronic back pain    HLD (hyperlipidemia)    Hypertension  Ischemic cardiomyopathy    a. 12/2013: EF 50-55% by echo with +WMA. b. EF 45% by cath in 08/2014.   Ischemic cardiomyopathy 05/19/2016   Peptic ulcer disease    Schizophrenic disorder ([place])    Tobacco abuse     Past Surgical History:  Procedure Laterality Date   ABDOMINAL AORTOGRAM W/LOWER EXTREMITY Bilateral 09/09/2021   Procedure: ABDOMINAL AORTOGRAM W/LOWER EXTREMITY;  Surgeon: Angelia Mould, MD;  Location: [facility];  Service: Cardiovascular;  Laterality: Bilateral;   CARDIAC CATHETERIZATION N/A 08/07/2014   Procedure: Left Heart Cath and Coronary Angiography;  Surgeon: Sherren Mocha, MD;  Location:  [place] [facility];  Service: Cardiovascular;  Laterality: N/A;   COLONOSCOPY N/A 06/06/2017   Procedure: COLONOSCOPY;  Surgeon: Rogene Houston, MD;  [facility];  Service: Endoscopy;  Laterality: N/A;  10:50   CORONARY STENT INTERVENTION N/A 05/19/2016   Procedure: Coronary Stent Intervention;  Surgeon: Lorretta Harp, MD;  Location: [facility];  Service: Cardiovascular;  Laterality: N/A;   CORONARY STENT INTERVENTION N/A 07/02/2018   Procedure: CORONARY STENT INTERVENTION;  Surgeon: Belva Crome, MD;  Location: [place] [facility];  Service: Cardiovascular;  Laterality: N/A;   HERNIA REPAIR     LEFT HEART CATH AND CORONARY ANGIOGRAPHY N/A 05/19/2016   Procedure: Left Heart Cath and Coronary Angiography;  Surgeon: Lorretta Harp, MD;  Location: [facility];  Service: Cardiovascular;  Laterality: N/A;   LEFT HEART CATH AND CORONARY ANGIOGRAPHY N/A 07/02/2018   Procedure: LEFT HEART CATH AND CORONARY ANGIOGRAPHY;  Surgeon: Belva Crome, MD;  Location: [facility];  Service: Cardiovascular;  Laterality: N/A;   LEFT HEART CATH AND CORONARY ANGIOGRAPHY N/A 12/21/2021   Procedure: LEFT HEART CATH AND CORONARY ANGIOGRAPHY;  Surgeon: Leonie Man, MD;  Location: [place] [facility];  Service: Cardiovascular;  Laterality: N/A;   LEFT HEART CATHETERIZATION WITH CORONARY ANGIOGRAM N/A 12/16/2013   Procedure: LEFT HEART CATHETERIZATION WITH CORONARY ANGIOGRAM;  Surgeon: Jettie Booze, MD;  Location: [facility] CATH LAB;  Service: Cardiovascular;  Laterality: N/A;   PERCUTANEOUS CORONARY STENT INTERVENTION (PCI-S)  12/16/2013   Procedure: PERCUTANEOUS CORONARY STENT INTERVENTION (PCI-S);  Surgeon: Jettie Booze, MD;  Location: [facility] C[facility] LAB;  Service: Cardiovascular;;  distal rca   PERIPHERAL VASCULAR BALLOON ANGIOPLASTY  09/09/2021   Procedure: PERIPHERAL VASCULAR BALLOON ANGIOPLASTY;  Surgeon: Angelia Mould, MD;  Location: [place] [facility];  Service:  Cardiovascular;;  Rt SFA   PERIPHERAL VASCULAR INTERVENTION  09/09/2021   Procedure: PERIPHERAL VASCULAR INTERVENTION;  Surgeon: Angelia Mould, MD;  Location: [place] [facility];  Service: Cardiovascular;;  Rt Iliac    Family Psychiatric History: Sister- Bipolar Disorder Multiple Maternal Aunts- Schizophrenia  Family History:  Family History  Problem Relation Age of Onset   Heart attack Father 73   Heart attack Brother 77    Social History:  Social History   Socioeconomic History   Marital status: Married    Spouse name: Not on file   Number of children: Not on file   Years of education: Not on file   Highest education level: Not on file  Occupational History   Not on file  Tobacco Use   Smoking status: Every Day    Packs/day: 1.00    Years: 25.00    Total pack years: 25.00    Types: Cigarettes, Cigars    Start date: 05/25/1988   Smokeless tobacco: Former  Types: Snuff    Quit date: 05/21/1988  Vaping Use   Vaping Use: Never used  Substance and Sexual Activity   Alcohol use: No    Alcohol/week: 0.0 standard drinks of alcohol   Drug use: Yes    Types: Marijuana    Comment: last used today   Sexual activity: Not on file  Other Topics Concern   Not on file  Social History Narrative   Not on file   Social Determinants of Health   Financial Resource Strain: Not on file  Food Insecurity: Not on file  Transportation Needs: Not on file  Physical Activity: Inactive (06/30/2018)   Exercise Vital Sign    Days of Exercise per Week: 0 days    Minutes of Exercise per Session: 0 min  Stress: Stress Concern Present (06/30/2018)   [place]    Feeling of Stress : Rather much  Social Connections: Somewhat Isolated (06/30/2018)   Social Connection and Isolation Panel [NHANES]    Frequency of Communication with Friends and Family: More than three times a week    Frequency of Social Gatherings with  Friends and Family: Never    Attends Religious Services: Never    Marine scientist or Organizations: No    Attends Archivist Meetings: Never    Marital Status: Married    Allergies:  Allergies  Allergen Reactions   Suboxone [Buprenorphine Hcl-Naloxone Hcl] Other (See Comments)    Caused kidney failure   Benadryl [Diphenhydramine] Other (See Comments)    LEG CRAMPING   Seroquel [Quetiapine] Other (See Comments)    "makes my legs cramp"   Tylenol Pm Extra [Diphenhydramine-Apap (Sleep)] Other (See Comments)    "makes my leg cramp"    Metabolic Disorder Labs: Lab Results  Component Value Date   HGBA1C 5.6 09/29/2021   MPG 108 05/19/2016   MPG 111 05/19/2016   No results found for: "PROLACTIN" Lab Results  Component Value Date   CHOL 219 (H) 09/10/2021   TRIG 128 09/10/2021   HDL 25 (L) 09/10/2021   CHOLHDL 8.8 09/10/2021   VLDL 26 09/10/2021   LDLCALC 168 (H) 09/10/2021   LDLCALC 210 (H) 07/01/2018   Lab Results  Component Value Date   TSH 1.310 09/29/2021   TSH 2.779 05/19/2016    Therapeutic Level Labs: No results found for: "LITHIUM" Lab Results  Component Value Date   VALPROATE 73.0 07/14/2009   No results found for: "CBMZ"  Current Medications: Current Outpatient Medications  Medication Sig Dispense Refill   OLANZapine (ZYPREXA) 5 MG tablet Take 1 tablet (5 mg total) by mouth at bedtime. Take a half tablet for 2 nights then take a whole tablet 30 tablet 1   aspirin EC 81 MG tablet Take 1 tablet (81 mg total) by mouth daily. Swallow whole. 150 tablet 2   atorvastatin (LIPITOR) 80 MG tablet TAKE 1 TABLET ONCE DAILY. (Patient taking differently: Take 80 mg by mouth at bedtime.) 30 tablet 6   benazepril (LOTENSIN) 40 MG tablet Take 1 tablet (40 mg total) by mouth daily. 90 tablet 3   carvedilol (COREG) 6.25 MG tablet Take 1 tablet (6.25 mg total) by mouth 2 (two) times daily with a meal. 60 tablet 3   clopidogrel (PLAVIX) 75 MG tablet Take 1  tablet (75 mg total) by mouth daily. 30 tablet 11   folic acid (FOLVITE) 1 MG tablet Take 1 tablet (1 mg total) by mouth daily. 90 tablet  1   hydrOXYzine (ATARAX) 25 MG tablet Take 1 tablet (25 mg total) by mouth 3 (three) times daily as needed. 45 tablet 0   mirtazapine (REMERON) 15 MG tablet Take 1 tablet (15 mg total) by mouth at bedtime. 30 tablet 1   omeprazole (PRILOSEC) 40 MG capsule Take 40 mg by mouth daily.     pantoprazole (PROTONIX) 20 MG tablet Take 1 tablet (20 mg total) by mouth daily. 90 tablet 1   No current facility-administered medications for this visit.     Musculoskeletal: Strength & Muscle Tone: within normal limits Gait & Station: normal Patient leans: N/A  Psychiatric Specialty Exam: Review of Systems  Respiratory:  Negative for cough and shortness of breath.   Cardiovascular:  Negative for chest pain.  Gastrointestinal:  Negative for abdominal pain, constipation, diarrhea, nausea and vomiting.  Neurological:  Positive for headaches (chronic). Negative for dizziness and weakness.  Psychiatric/Behavioral:  Positive for agitation, dysphoric mood and sleep disturbance. Negative for hallucinations and suicidal ideas. The patient is nervous/anxious.     Blood pressure 131/85, pulse 72, height 5\' 7"  (1.702 m), weight 177 lb 9.6 oz (80.6 kg), SpO2 97 %.Body mass index is 27.82 kg/m.  General Appearance: Casual  Eye Contact:  Good  Speech:  Clear and Coherent and Normal Rate  Volume:  Normal  Mood:  Anxious and Depressed  Affect:  Congruent  Thought Process:  Coherent and Goal Directed  Orientation:  Full (Time, Place, and Person)  Thought Content: WDL and Logical   Suicidal Thoughts:  No  Homicidal Thoughts:  No  Memory:  Immediate;   Good Recent;   Good  Judgement:  Good  Insight:  Good  Psychomotor Activity:  Normal  Concentration:  Concentration: Good and Attention Span: Good  Recall:  Good  Fund of Knowledge: Good  Language: Good  Akathisia:   Negative  Handed:  Right  AIMS (if indicated): done AIMS=0  Assets:  Communication Skills Desire for Improvement Housing Resilience Social Support  ADL's:  Intact  Cognition: WNL  Sleep:  Poor   Screenings: PHQ2-9    Flowsheet Row Office Visit from 12/12/2021 in [facility] Office Visit from 11/14/2021 in [facility] Office Visit from 09/29/2021 in [facility]  PHQ-2 Total Score 6 6 6   PHQ-9 Total Score 18 21 22       Flowsheet Row Office Visit from 09/29/2021 in [facility] Admission (Discharged) from 09/09/2021 in [facility] SURGICAL PROGRESSIVE CARE ED from 07/23/2020 in [facility] EMERGENCY DEPARTMENT  C-SSRS RISK CATEGORY No Risk No Risk No Risk        Assessment and Plan:  Don Fletcher is a 51 yr old male who presents for follow up and for medication management.  PPHx is significant for Depression, Anxiety, and Schizophrenia, multiple Suicide Attempts (Hanging, OD, put gun in his mouth, last ~10 yrs ago), and multiple Hospitalizations ([place], [facility] in [place], last ~10 yrs ago), and no history of Self Injurious Behavior.  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     Don Fletcher continues to have significant issues with his mood and sleep.  As Risperdal has only been minimally effective we will trial Zyprexa.  If he continues to have issues may consider the addition of a mood stabilizer (Trileptal or Depakote).  He still would benefit from therapy so will suggest this again at follow-up.  He  will return for follow-up in approximately 4 weeks.   Schizophrenia  GAD  PTSD  MDD: -Start Zyprexa 2.5 mg for 2 days then increase to 5 mg for psychosis, anger issues, and augmentation.  60 tablets with 1 refill. -Continue Remeron 15 mg QHS for depression, anxiety, insomnia, and appetite.  30 tablets with 1 refill. -Continue Hydroxyzine 25 mg  TID PRN.  45 tablets with 0 refills   Collaboration of Care:   Patient/Guardian was advised Release of Information must be obtained prior to any record release in order to collaborate their care with an outside provider. Patient/Guardian was advised if they have not already done so to contact the registration department to sign all necessary forms in order for [place] to release information regarding their care.   Consent: Patient/Guardian gives verbal consent for treatment and assignment of benefits for services provided during this visit. Patient/Guardian expressed understanding and agreed to proceed.    Briant Cedar, MD 01/17/2022, 3:59 PM

## 2022-01-19 ENCOUNTER — Ambulatory Visit (INDEPENDENT_AMBULATORY_CARE_PROVIDER_SITE_OTHER): Payer: Medicaid Other | Admitting: Orthopaedic Surgery

## 2022-01-19 ENCOUNTER — Encounter: Payer: Self-pay | Admitting: Orthopaedic Surgery

## 2022-01-19 VITALS — Ht 67.0 in | Wt 177.0 lb

## 2022-01-19 DIAGNOSIS — M1611 Unilateral primary osteoarthritis, right hip: Secondary | ICD-10-CM | POA: Diagnosis not present

## 2022-01-19 NOTE — Progress Notes (Signed)
Office Visit Note   Patient: Don Fletcher           Date of Birth: 04/12/1970           MRN: [id number] Visit Date: 01/19/2022              Requested by: Billie Lade, MD [address] [place],  [place] [address] PCP: Billie Lade, MD   Assessment & Plan: Visit Diagnoses:  1. Unilateral primary osteoarthritis, right hip     Plan: Progressive right hip osteoarthritis.  Patient is stable by cardiac and peripheral vascular standpoint after recent stenting.  We discussed the right total of arthroplasty direct anterior approach.  Use a walker postop.  Risk surgery including infection fracture.  We discussed spinal anesthesia, Marcaine, Exparel, use of his walker for several weeks.  Procedure discussed questions elicited and answered he understands request to proceed.  Follow-Up Instructions: No follow-ups on file.   Orders:  No orders of the defined types were placed in this encounter.  No orders of the defined types were placed in this encounter.     Procedures: No procedures performed   Clinical Data: No additional findings.   Subjective: Chief Complaint  Patient presents with   Left Hip - Pain   Right Hip - Pain    HPI 51 year old male returns with progressive right greater than left hip osteoarthritis.  He has had peripheral stent femoral artery and the right leg has pulses now.  Had history of cardiac problems in the past and states cardiology said he is ready to proceed with his hip surgery at this point.  Patient continues to smoke and states he started using nicotine when he was a child.Patient is used ibuprofen in the past did not work currently cardiologist told him not use any anti-inflammatories.  Review of Systems positive history of PAD tobacco use STEMI.  History of cardiac stents.   Objective: Vital Signs: Ht 5\' 7"  (1.702 m)   Wt 177 lb (80.3 kg)   BMI 27.72 kg/m   Physical Exam Constitutional:      Appearance: He is well-developed.   HENT:     Head: Normocephalic and atraumatic.     Right Ear: External ear normal.     Left Ear: External ear normal.  Eyes:     Pupils: Pupils are equal, round, and reactive to light.  Neck:     Thyroid: No thyromegaly.     Trachea: No tracheal deviation.  Cardiovascular:     Rate and Rhythm: Normal rate.  Pulmonary:     Effort: Pulmonary effort is normal.     Breath sounds: No wheezing.  Abdominal:     General: Bowel sounds are normal.     Palpations: Abdomen is soft.  Musculoskeletal:     Cervical back: Neck supple.  Skin:    General: Skin is warm and dry.     Capillary Refill: Capillary refill takes less than 2 seconds.  Neurological:     Mental Status: He is alert and oriented to person, place, and time.  Psychiatric:        Behavior: Behavior normal.        Thought Content: Thought content normal.        Judgment: Judgment normal.     Ortho Exam patient has 15 degrees internal rotation right hip with reproduction of hip pain.  Negative straight leg raising 90 degrees negative popliteal compression test.  He is amatory with a cane  he also has a walker at home.  Positive Trendelenburg gait.  Pedal pulses dorsalis pedis posterior tib are palpable bilaterally.  Specialty Comments:  No specialty comments available.  Imaging: XR Hip Right w Pelvis 2 or 3 Views  Anatomical Region Laterality Modality  Hip bilateral Computed Radiography  Pelvis -- --   Impression  1. Bilateral hip osteoarthritis, right greater than left. 2. No acute bony abnormality.   Electronically Signed   By: Sharlet Salina M.D.   On: 07/01/2021 21:43 Narrative  CLINICAL DATA:  Chronic right hip pain  EXAM: DG HIP (WITH OR WITHOUT PELVIS) 2-3V RIGHT  COMPARISON:  None Available.  FINDINGS: Frontal view of the pelvis as well as frontal and frogleg lateral views of the right hip are obtained. There is bilateral hip osteoarthritis, moderate on the right and mild on the left. No  acute fracture, subluxation, or dislocation. Sacroiliac joints are unremarkable. Soft tissues are normal. Procedure Note  Hinton Dyer, MD - 07/01/2021 Formatting of this note might be different from the original. CLINICAL DATA:  Chronic right hip pain  EXAM: DG HIP (WITH OR WITHOUT PELVIS) 2-3V RIGHT  COMPARISON:  None Available.  FINDINGS: Frontal view of the pelvis as well as frontal and frogleg lateral views of the right hip are obtained. There is bilateral hip osteoarthritis, moderate on the right and mild on the left. No acute fracture, subluxation, or dislocation. Sacroiliac joints are unremarkable. Soft tissues are normal.  IMPRESSION: 1. Bilateral hip osteoarthritis, right greater than left. 2. No acute bony abnormality.   Electronically Signed   By: Sharlet Salina M.D.   On: 07/01/2021 21:43   PMFS History: Patient Active Problem List   Diagnosis Date Noted   Abnormal nuclear stress test 12/21/2021   Preop cardiovascular exam 12/21/2021   Lung nodule seen on imaging study 12/16/2021   GERD (gastroesophageal reflux disease) 12/16/2021   Folate deficiency 11/16/2021   Left shoulder pain 11/16/2021   Positive screening for depression on 9-item Patient Health Questionnaire (PHQ-9) 09/29/2021   Preventative health care 09/29/2021   Insomnia 09/29/2021   PAD (peripheral artery disease) (HCC) 09/09/2021   Unilateral primary osteoarthritis, right hip 07/21/2021   NSTEMI (non-ST elevated myocardial infarction) (HCC) 06/30/2018   Family hx of colon cancer 02/14/2017   STEMI (ST elevation myocardial infarction) (HCC) 05/19/2016   Acute ST elevation myocardial infarction (STEMI) involving left circumflex coronary artery without development of Q waves (HCC) 05/19/2016   Ischemic cardiomyopathy 05/19/2016   Unstable angina (HCC) 08/06/2014   ST elevation myocardial infarction involving right coronary artery (HCC)    CAD (coronary artery disease)    Tobacco  abuse    HLD (hyperlipidemia)    Hypertension    Family history of premature CAD 12/16/2013   CAD S/P PCI:  2.0 x 23 vision bare metal stent (2.25 mm) RPL 12/16/2013    Class: Acute   Schizophrenic disorder (HCC)    Bipolar 1 disorder (HCC)    Past Medical History:  Diagnosis Date   Bipolar 1 disorder (HCC)    CAD (coronary artery disease)    a. 12/16/13 inferolat STEMI s/p BMS to RPL. b. Cath 08/2014: interim occlusion of the PLA stent of the RCA, collateralized from the left coronary artery with diffuse nonobstructive CAD;  c. STEMI/PCI: LM nl, LAD nl, LCX 122m/d (2.75x12 synergy DES), RCA 30p, RPDA 75ost/p, RPAV 100 w/ L->R collats.   Chronic back pain    HLD (hyperlipidemia)    Hypertension  Ischemic cardiomyopathy    a. 12/2013: EF 50-55% by echo with +WMA. b. EF 45% by cath in 08/2014.   Ischemic cardiomyopathy 05/19/2016   Peptic ulcer disease    Schizophrenic disorder (HCC)    Tobacco abuse     Family History  Problem Relation Age of Onset   Heart attack Father 69   Heart attack Brother 32    Past Surgical History:  Procedure Laterality Date   ABDOMINAL AORTOGRAM W/LOWER EXTREMITY Bilateral 09/09/2021   Procedure: ABDOMINAL AORTOGRAM W/LOWER EXTREMITY;  Surgeon: Chuck Hint, MD;  Location: [facility] INVASIVE CV LAB;  Service: Cardiovascular;  Laterality: Bilateral;   CARDIAC CATHETERIZATION N/A 08/07/2014   Procedure: Left Heart Cath and Coronary Angiography;  Surgeon: Tonny Bollman, MD;  Location: [facility] INVASIVE CV LAB;  Service: Cardiovascular;  Laterality: N/A;   COLONOSCOPY N/A 06/06/2017   Procedure: COLONOSCOPY;  Surgeon: Malissa Hippo, MD;  Location: [facility] ENDO SUITE;  Service: Endoscopy;  Laterality: N/A;  10:50   CORONARY STENT INTERVENTION N/A 05/19/2016   Procedure: Coronary Stent Intervention;  Surgeon: Runell Gess, MD;  Location: [facility] INVASIVE CV LAB;  Service: Cardiovascular;  Laterality: N/A;   CORONARY STENT INTERVENTION N/A 07/02/2018   Procedure: CORONARY  STENT INTERVENTION;  Surgeon: Lyn Records, MD;  Location: [facility] INVASIVE CV LAB;  Service: Cardiovascular;  Laterality: N/A;   HERNIA REPAIR     LEFT HEART CATH AND CORONARY ANGIOGRAPHY N/A 05/19/2016   Procedure: Left Heart Cath and Coronary Angiography;  Surgeon: Runell Gess, MD;  Location: [facility] INVASIVE CV LAB;  Service: Cardiovascular;  Laterality: N/A;   LEFT HEART CATH AND CORONARY ANGIOGRAPHY N/A 07/02/2018   Procedure: LEFT HEART CATH AND CORONARY ANGIOGRAPHY;  Surgeon: Lyn Records, MD;  Location: [facility] INVASIVE CV LAB;  Service: Cardiovascular;  Laterality: N/A;   LEFT HEART CATH AND CORONARY ANGIOGRAPHY N/A 12/21/2021   Procedure: LEFT HEART CATH AND CORONARY ANGIOGRAPHY;  Surgeon: Marykay Lex, MD;  Location: [facility] INVASIVE CV LAB;  Service: Cardiovascular;  Laterality: N/A;   LEFT HEART CATHETERIZATION WITH CORONARY ANGIOGRAM N/A 12/16/2013   Procedure: LEFT HEART CATHETERIZATION WITH CORONARY ANGIOGRAM;  Surgeon: Corky Crafts, MD;  Location: [facility] CATH LAB;  Service: Cardiovascular;  Laterality: N/A;   PERCUTANEOUS CORONARY STENT INTERVENTION (PCI-S)  12/16/2013   Procedure: PERCUTANEOUS CORONARY STENT INTERVENTION (PCI-S);  Surgeon: Corky Crafts, MD;  Location: [facility] CATH LAB;  Service: Cardiovascular;;  distal rca   PERIPHERAL VASCULAR BALLOON ANGIOPLASTY  09/09/2021   Procedure: PERIPHERAL VASCULAR BALLOON ANGIOPLASTY;  Surgeon: Chuck Hint, MD;  Location: [facility] INVASIVE CV LAB;  Service: Cardiovascular;;  Rt SFA   PERIPHERAL VASCULAR INTERVENTION  09/09/2021   Procedure: PERIPHERAL VASCULAR INTERVENTION;  Surgeon: Chuck Hint, MD;  Location: [facility] INVASIVE CV LAB;  Service: Cardiovascular;;  Rt Iliac   Social History   Occupational History   Not on file  Tobacco Use   Smoking status: Every Day    Packs/day: 1.00    Years: 25.00    Total pack years: 25.00    Types: Cigarettes, Cigars    Start date: 05/25/1988   Smokeless tobacco: Former    Types:  Snuff    Quit date: 05/21/1988  Vaping Use   Vaping Use: Never used  Substance and Sexual Activity   Alcohol use: No    Alcohol/week: 0.0 standard drinks of alcohol   Drug use: Yes    Types: Marijuana    Comment: last used today   Sexual activity: Not  on file

## 2022-01-25 ENCOUNTER — Ambulatory Visit: Payer: Medicaid Other | Attending: Cardiovascular Disease | Admitting: Pharmacist

## 2022-01-25 VITALS — BP 125/84 | HR 83 | Ht 67.0 in | Wt 176.0 lb

## 2022-01-25 DIAGNOSIS — Z8249 Family history of ischemic heart disease and other diseases of the circulatory system: Secondary | ICD-10-CM | POA: Diagnosis present

## 2022-01-25 DIAGNOSIS — I2510 Atherosclerotic heart disease of native coronary artery without angina pectoris: Secondary | ICD-10-CM | POA: Diagnosis present

## 2022-01-25 DIAGNOSIS — I2121 ST elevation (STEMI) myocardial infarction involving left circumflex coronary artery: Secondary | ICD-10-CM | POA: Insufficient documentation

## 2022-01-25 DIAGNOSIS — E782 Mixed hyperlipidemia: Secondary | ICD-10-CM | POA: Diagnosis not present

## 2022-01-25 MED ORDER — EZETIMIBE 10 MG PO TABS: 10.0000 mg | ORAL_TABLET | Freq: Every day | ORAL | 11 refills | Status: DC

## 2022-01-25 NOTE — Progress Notes (Signed)
Patient ID: THEDORE Fletcher                 DOB: [birth year]                    MRN: [id number]     HPI: Don Fletcher is a 51 y.o. male patient referred to lipid clinic by Dr Jenene Slicker. PMH is significant for CAD, STEMI, NSTEMI, PAD, smoking, biopolar disorder and schizophrenia. Has had multiple cardiac catheterizations, most recent 12/21/21.  Patient presents today with wife. Is on disability and receives Medicaid. Has issues affording medications.  Smokes 1 pack of cigars daily which is a decrease from 2 per day. Not able to be physically active due to hip paid. Treated for PAD.  Former substance abuser.  Currently on atorvastatin 80mg  daily and reports no adverse effects. Reports compliance.  Current Medications:  Atorvastatin 80mg  daily  Risk Factors:  CAD PAD STEMI NSTEMI Smoking Family History   LDL goal: <55  Labs: TC 219, HDL 25, LDL 168, Trigs 128 (09/10/21 on atorvastatin 80mg )  Past Medical History:  Diagnosis Date   Bipolar 1 disorder (HCC)    CAD (coronary artery disease)    a. 12/16/13 inferolat STEMI s/p BMS to RPL. b. Cath 08/2014: interim occlusion of the PLA stent of the RCA, collateralized from the left coronary artery with diffuse nonobstructive CAD;  c. STEMI/PCI: LM nl, LAD nl, LCX 125m/d (2.75x12 synergy DES), RCA 30p, RPDA 75ost/p, RPAV 100 w/ L->R collats.   Chronic back pain    HLD (hyperlipidemia)    Hypertension    Ischemic cardiomyopathy    a. 12/2013: EF 50-55% by echo with +WMA. b. EF 45% by cath in 08/2014.   Ischemic cardiomyopathy 05/19/2016   Peptic ulcer disease    Schizophrenic disorder (HCC)    Tobacco abuse     Current Outpatient Medications on File Prior to Visit  Medication Sig Dispense Refill   aspirin EC 81 MG tablet Take 1 tablet (81 mg total) by mouth daily. Swallow whole. 150 tablet 2   atorvastatin (LIPITOR) 80 MG tablet TAKE 1 TABLET ONCE DAILY. (Patient taking differently: Take 80 mg by mouth at bedtime.) 30 tablet 6    benazepril (LOTENSIN) 40 MG tablet Take 1 tablet (40 mg total) by mouth daily. 90 tablet 3   carvedilol (COREG) 6.25 MG tablet Take 1 tablet (6.25 mg total) by mouth 2 (two) times daily with a meal. 60 tablet 3   clopidogrel (PLAVIX) 75 MG tablet Take 1 tablet (75 mg total) by mouth daily. 30 tablet 11   folic acid (FOLVITE) 1 MG tablet Take 1 tablet (1 mg total) by mouth daily. 90 tablet 1   hydrOXYzine (ATARAX) 25 MG tablet Take 1 tablet (25 mg total) by mouth 3 (three) times daily as needed. 45 tablet 0   mirtazapine (REMERON) 15 MG tablet Take 1 tablet (15 mg total) by mouth at bedtime. 30 tablet 1   OLANZapine (ZYPREXA) 5 MG tablet Take 1 tablet (5 mg total) by mouth at bedtime. Take a half tablet for 2 nights then take a whole tablet 30 tablet 1   omeprazole (PRILOSEC) 40 MG capsule Take 40 mg by mouth daily.     pantoprazole (PROTONIX) 20 MG tablet Take 1 tablet (20 mg total) by mouth daily. 90 tablet 1   No current facility-administered medications on file prior to visit.    Allergies  Allergen Reactions   Suboxone [Buprenorphine Hcl-Naloxone Hcl] Other (See Comments)  Caused kidney failure   Benadryl [Diphenhydramine] Other (See Comments)    LEG CRAMPING   Seroquel [Quetiapine] Other (See Comments)    "makes my legs cramp"   Tylenol Pm Extra [Diphenhydramine-Apap (Sleep)] Other (See Comments)    "makes my leg cramp"    Assessment/Plan:  1. Hyperlipidemia - Patient LDL 168 which is above goal of <55. Aggressive goal selected due to numerous cardiac events.   Patient will need aggressive lipid lowering with PCSK9i. However is on Medicaid so will likely have to try ezetimibe first. Patient agreeable.  Explained Repatha will be more effective for lipid lowering and preventing future events. Using demo pen educated patient on mechanism of action, storage, site selection and administration.  Will complete PA through [facility] and contact patient when approved. Recheck lipid panel  in 2-3 months after starting.  Continue atorvastatin 80mg  daily Start ezetimibe 10mg  daily Start Repatha 140mg  sq q 2 weeks Recheck lipid panel in 2-3 months  , PharmD, BCACP, CDCES, CPP [address] [place], [place], [address] Phone: [phone], Fax: [phone]

## 2022-01-25 NOTE — Patient Instructions (Addendum)
It was nice meeting you two today  We would like your LDL (bad cholesterol) to be less than 55  Please continue your atorvastatin 80mg  once a day  We will start a new medication called ezetimibe 10mg  once daily  We would like to start you on a new medication called Repatha which you would inject once every 2 weeks  Once you start the medication we will recheck your cholesterol in 2-3 months  Please let know if you have any questions  , PharmD, BCACP, CDCES, CPP [address] [place], [address], [place] Phone: [phone], Fax: [phone]

## 2022-02-01 ENCOUNTER — Telehealth: Payer: Self-pay

## 2022-02-01 NOTE — Telephone Encounter (Signed)
   Patient Name: Don Fletcher  DOB: 11-Apr-1970 MRN: [id number]  Primary Cardiologist: Dina Rich, MD  Chart reviewed as part of pre-operative protocol coverage. Given past medical history and time since last visit, based on ACC/AHA guidelines, MILIND RAETHER is at acceptable risk for the planned procedure without further cardiovascular testing.   Per Dr. Mikal Plane, cardiologist, "Patient is at a moderate risk for any peri-operative cardiac complications. No further cardiac testing is indicated prior to the surgery. Can hold anti-platelets one week prior to surgery and can resume when safe from bleeding standpoint."  I will route this recommendation to the requesting party via Epic fax function and remove from pre-op pool.  Please call with questions.  Flossie Dibble, NP 02/01/2022, 3:43 PM

## 2022-02-01 NOTE — Telephone Encounter (Signed)
   Pre-operative Risk Assessment    Patient Name: Don Fletcher  DOB: 11-30-1970 MRN: [id number]      Request for Surgical Clearance    Procedure:   RIGHT TOTAL HIP ARTHROPLASTY  Date of Surgery:  Clearance TBD                                 Surgeon:   Surgeon's Group or Practice Name: [facility]  Phone number:  [phone] Fax number:  [phone]    Type of Clearance Requested:   - Medical  - Pharmacy:  Hold Clopidogrel (Plavix)     Type of Anesthesia:  Spinal   Additional requests/questions:    [name]   02/01/2022, 7:46 AM

## 2022-02-14 ENCOUNTER — Ambulatory Visit (HOSPITAL_COMMUNITY): Payer: Medicaid Other | Admitting: Student in an Organized Health Care Education/Training Program

## 2022-02-14 ENCOUNTER — Ambulatory Visit: Payer: Self-pay | Admitting: Internal Medicine

## 2022-02-22 ENCOUNTER — Encounter (HOSPITAL_COMMUNITY): Payer: Self-pay | Admitting: Student in an Organized Health Care Education/Training Program

## 2022-02-22 ENCOUNTER — Ambulatory Visit (HOSPITAL_BASED_OUTPATIENT_CLINIC_OR_DEPARTMENT_OTHER): Payer: Medicaid Other | Admitting: Student in an Organized Health Care Education/Training Program

## 2022-02-22 VITALS — BP 155/103 | HR 80 | Ht 67.0 in | Wt 179.8 lb

## 2022-02-22 DIAGNOSIS — F4310 Post-traumatic stress disorder, unspecified: Secondary | ICD-10-CM | POA: Diagnosis not present

## 2022-02-22 DIAGNOSIS — F411 Generalized anxiety disorder: Secondary | ICD-10-CM

## 2022-02-22 DIAGNOSIS — F339 Major depressive disorder, recurrent, unspecified: Secondary | ICD-10-CM | POA: Diagnosis not present

## 2022-02-22 DIAGNOSIS — G4700 Insomnia, unspecified: Secondary | ICD-10-CM | POA: Diagnosis not present

## 2022-02-22 MED ORDER — MIRTAZAPINE 30 MG PO TABS: 30.0000 mg | ORAL_TABLET | Freq: Every day | ORAL | 1 refills | Status: DC

## 2022-02-22 MED ORDER — OLANZAPINE 5 MG PO TABS: 5.0000 mg | ORAL_TABLET | Freq: Every day | ORAL | 1 refills | Status: DC

## 2022-02-22 MED ORDER — HYDROXYZINE HCL 25 MG PO TABS: 25.0000 mg | ORAL_TABLET | Freq: Three times a day (TID) | ORAL | 0 refills | Status: DC | PRN

## 2022-02-22 NOTE — Progress Notes (Signed)
BH MD/PA/NP OP Progress Note  02/22/2022 3:34 PM Don Fletcher  MRN:  [id number]  Chief Complaint:  Chief Complaint  Patient presents with   Follow-up   Depression   Anxiety   Agitation   HPI:  Don Fletcher is a 52 yr old male who presents for follow up and for medication management.  PPHx is significant for Depression, Anxiety, and Schizophrenia, multiple Suicide Attempts (Hanging, OD, put gun in his mouth, last ~10 yrs ago), and multiple Hospitalizations ([place], [facility] in [place], last ~10 yrs ago), and no history of Self Injurious Behavior.  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     He presents with his wife today.  He reports that he has had some improvement since starting Zyprexa.  He reports no side effects to it.  He reports that sleep continues to be a significant issue with him.  He reports having nightmares every night.  He reports that they can be so bad that he does not want to go to sleep.  He reports that on average he gets 4 to 5 hours of sleep at night.  He reports that he does continue to be irritable and have outbursts.  Discussed with him that prazosin can help with night terrors associated with PTSD.  Discussed with him that since he is on 2 antihypertensives already as well as his complex cardiac history we would need to wait until his next appointment with his PCP.  He reports that he has an appointment with Dr. Durwin Nora on 2/7.  Discussed with them that they have a discussion with Dr. Durwin Nora to see if he has any objections or thinks any adjustments to his other medications need to be made.  If there are no concerns they will call the office and a prescription for prazosin can be sent.  Discussed potential risks and side effects with prazosin and he was agreeable to a trial.  When asked if he had further considered potential PTSD therapy groups he reports that he cannot do that.  He reports he still  does not like being around others and it would just not work.  Discussed the potential of telephone therapy and he reports this would not work for him because he does not like to talk on the phone either.  Discussed that we would today increase his Remeron and then await the input of Dr. Durwin Nora on starting the prazosin.  He was agreeable with this.  He reports no SI, HI, or AVH.  He reports his sleep is poor.  He reports his appetite has improved.  He reports chronic headaches.  He reports no other concerns at present.  He will return for follow-up in approximately 6 weeks.   Visit Diagnosis:    ICD-10-CM   1. PTSD (post-traumatic stress disorder)  F43.10 hydrOXYzine (ATARAX) 25 MG tablet    OLANZapine (ZYPREXA) 5 MG tablet    DISCONTINUED: OLANZapine (ZYPREXA) 5 MG tablet    2. Insomnia, unspecified type  G47.00 mirtazapine (REMERON) 30 MG tablet    OLANZapine (ZYPREXA) 5 MG tablet    DISCONTINUED: OLANZapine (ZYPREXA) 5 MG tablet    3. Episode of recurrent major depressive disorder, unspecified depression episode severity (HCC)  F33.9 mirtazapine (REMERON) 30 MG tablet    OLANZapine (ZYPREXA) 5 MG tablet    DISCONTINUED: OLANZapine (ZYPREXA) 5 MG tablet    4. GAD (generalized anxiety disorder)  F41.1 mirtazapine (  REMERON) 30 MG tablet    hydrOXYzine (ATARAX) 25 MG tablet      Past Psychiatric History: Depression, Anxiety, and Schizophrenia, multiple Suicide Attempts (Hanging, OD, put gun in his mouth, last ~10 yrs ago), and multiple Hospitalizations ([place], [facility] in [place], last ~10 yrs ago), and no history of Self Injurious Behavior.  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     Past Medical History:  Past Medical History:  Diagnosis Date   Bipolar 1 disorder ([place])    CAD (coronary artery disease)    a. 12/16/13 inferolat STEMI s/p BMS to RPL. b. Cath 08/2014: interim occlusion of the PLA stent of the RCA,  collateralized from the left coronary artery with diffuse nonobstructive CAD;  c. STEMI/PCI: LM nl, LAD nl, LCX 162m/d (2.75x12 synergy DES), RCA 30p, RPDA 75ost/p, RPAV 100 w/ L->R collats.   Chronic back pain    HLD (hyperlipidemia)    Hypertension    Ischemic cardiomyopathy    a. 12/2013: EF 50-55% by echo with +WMA. b. EF 45% by cath in 08/2014.   Ischemic cardiomyopathy 05/19/2016   Peptic ulcer disease    Schizophrenic disorder ([place])    Tobacco abuse     Past Surgical History:  Procedure Laterality Date   ABDOMINAL AORTOGRAM W/LOWER EXTREMITY Bilateral 09/09/2021   Procedure: ABDOMINAL AORTOGRAM W/LOWER EXTREMITY;  Surgeon: Angelia Mould, MD;  Location: [place] CV LAB;  Service: Cardiovascular;  Laterality: Bilateral;   CARDIAC CATHETERIZATION N/A 08/07/2014   Procedure: Left Heart Cath and Coronary Angiography;  Surgeon: Sherren Mocha, MD;  Location: [place] CV LAB;  Service: Cardiovascular;  Laterality: N/A;   COLONOSCOPY N/A 06/06/2017   Procedure: COLONOSCOPY;  Surgeon: Rogene Houston, MD;  [facility];  Service: Endoscopy;  Laterality: N/A;  10:50   CORONARY STENT INTERVENTION N/A 05/19/2016   Procedure: Coronary Stent Intervention;  Surgeon: Lorretta Harp, MD;  Location: [place] CV LAB;  Service: Cardiovascular;  Laterality: N/A;   CORONARY STENT INTERVENTION N/A 07/02/2018   Procedure: CORONARY STENT INTERVENTION;  Surgeon: Belva Crome, MD;  Location: [place] CV LAB;  Service: Cardiovascular;  Laterality: N/A;   HERNIA REPAIR     LEFT HEART CATH AND CORONARY ANGIOGRAPHY N/A 05/19/2016   Procedure: Left Heart Cath and Coronary Angiography;  Surgeon: Lorretta Harp, MD;  Location: [place] CV LAB;  Service: Cardiovascular;  Laterality: N/A;   LEFT HEART CATH AND CORONARY ANGIOGRAPHY N/A 07/02/2018   Procedure: LEFT HEART CATH AND CORONARY ANGIOGRAPHY;  Surgeon: Belva Crome, MD;  Location: [place] CV LAB;  Service: Cardiovascular;   Laterality: N/A;   LEFT HEART CATH AND CORONARY ANGIOGRAPHY N/A 12/21/2021   Procedure: LEFT HEART CATH AND CORONARY ANGIOGRAPHY;  Surgeon: Leonie Man, MD;  Location: [place] CV LAB;  Service: Cardiovascular;  Laterality: N/A;   LEFT HEART CATHETERIZATION WITH CORONARY ANGIOGRAM N/A 12/16/2013   Procedure: LEFT HEART CATHETERIZATION WITH CORONARY ANGIOGRAM;  Surgeon: Jettie Booze, MD;  Location: [facility] C[facility];  Service: Cardiovascular;  Laterality: N/A;   PERCUTANEOUS CORONARY STENT INTERVENTION (PCI-S)  12/16/2013   Procedure: PERCUTANEOUS CORONARY STENT INTERVENTION (PCI-S);  Surgeon: Jettie Booze, MD;  Location: [facility] CATH LAB;  Service: Cardiovascular;;  distal rca   PERIPHERAL VASCULAR BALLOON ANGIOPLASTY  09/09/2021   Procedure: PERIPHERAL VASCULAR BALLOON ANGIOPLASTY;  Surgeon: Angelia Mould, MD;  Location: [place] CV LAB;  Service: Cardiovascular;;  Rt SFA   PERIPHERAL VASCULAR INTERVENTION  09/09/2021   Procedure: PERIPHERAL VASCULAR INTERVENTION;  Surgeon: Angelia Mould, MD;  Location: [place] CV LAB;  Service: Cardiovascular;;  Rt Iliac    Family Psychiatric History: Sister- Bipolar Disorder Multiple Maternal Aunts- Schizophrenia  Family History:  Family History  Problem Relation Age of Onset   Heart attack Father 22   Heart attack Brother 38    Social History:  Social History   Socioeconomic History   Marital status: Married    Spouse name: Not on file   Number of children: Not on file   Years of education: Not on file   Highest education level: Not on file  Occupational History   Not on file  Tobacco Use   Smoking status: Every Day    Packs/day: 1.00    Years: 25.00    Total pack years: 25.00    Types: Cigarettes, Cigars    Start date: 05/25/1988   Smokeless tobacco: Former    Types: Snuff    Quit date: 05/21/1988  Vaping Use   Vaping Use: Never used  Substance and Sexual Activity   Alcohol use: No    Alcohol/week:  0.0 standard drinks of alcohol   Drug use: Yes    Types: Marijuana    Comment: last used today   Sexual activity: Not on file  Other Topics Concern   Not on file  Social History Narrative   Not on file   Social Determinants of Health   Financial Resource Strain: Not on file  Food Insecurity: Not on file  Transportation Needs: Not on file  Physical Activity: Inactive (06/30/2018)   Exercise Vital Sign    Days of Exercise per Week: 0 days    Minutes of Exercise per Session: 0 min  Stress: Stress Concern Present (06/30/2018)   [place]    Feeling of Stress : Rather much  Social Connections: Somewhat Isolated (06/30/2018)   Social Connection and Isolation Panel [NHANES]    Frequency of Communication with Friends and Family: More than three times a week    Frequency of Social Gatherings with Friends and Family: Never    Attends Religious Services: Never    Marine scientist or Organizations: No    Attends Archivist Meetings: Never    Marital Status: Married    Allergies:  Allergies  Allergen Reactions   Suboxone [Buprenorphine Hcl-Naloxone Hcl] Other (See Comments)    Caused kidney failure   Benadryl [Diphenhydramine] Other (See Comments)    LEG CRAMPING   Seroquel [Quetiapine] Other (See Comments)    "makes my legs cramp"   Tylenol Pm Extra [Diphenhydramine-Apap (Sleep)] Other (See Comments)    "makes my leg cramp"    Metabolic Disorder Labs: Lab Results  Component Value Date   HGBA1C 5.6 09/29/2021   MPG 108 05/19/2016   MPG 111 05/19/2016   No results found for: "PROLACTIN" Lab Results  Component Value Date   CHOL 219 (H) 09/10/2021   TRIG 128 09/10/2021   HDL 25 (L) 09/10/2021   CHOLHDL 8.8 09/10/2021   VLDL 26 09/10/2021   LDLCALC 168 (H) 09/10/2021   LDLCALC 210 (H) 07/01/2018   Lab Results  Component Value Date   TSH 1.310 09/29/2021   TSH 2.779 05/19/2016     Therapeutic Level Labs: No results found for: "LITHIUM" Lab Results  Component Value Date   VALPROATE 73.0 07/14/2009  No results found for: "CBMZ"  Current Medications: Current Outpatient Medications  Medication Sig Dispense Refill   aspirin EC 81 MG tablet Take 1 tablet (81 mg total) by mouth daily. Swallow whole. 150 tablet 2   atorvastatin (LIPITOR) 80 MG tablet TAKE 1 TABLET ONCE DAILY. 30 tablet 6   benazepril (LOTENSIN) 40 MG tablet Take 1 tablet (40 mg total) by mouth daily. 90 tablet 3   carvedilol (COREG) 6.25 MG tablet Take 1 tablet (6.25 mg total) by mouth 2 (two) times daily with a meal. 60 tablet 3   clopidogrel (PLAVIX) 75 MG tablet Take 1 tablet (75 mg total) by mouth daily. 30 tablet 11   ezetimibe (ZETIA) 10 MG tablet Take 1 tablet (10 mg total) by mouth daily. 30 tablet 11   folic acid (FOLVITE) 1 MG tablet Take 1 tablet (1 mg total) by mouth daily. 90 tablet 1   hydrOXYzine (ATARAX) 25 MG tablet Take 1 tablet (25 mg total) by mouth 3 (three) times daily as needed. 45 tablet 0   mirtazapine (REMERON) 30 MG tablet Take 1 tablet (30 mg total) by mouth at bedtime. 30 tablet 1   OLANZapine (ZYPREXA) 5 MG tablet Take 1 tablet (5 mg total) by mouth at bedtime. 30 tablet 1   omeprazole (PRILOSEC) 40 MG capsule Take 40 mg by mouth daily.     pantoprazole (PROTONIX) 20 MG tablet Take 1 tablet (20 mg total) by mouth daily. 90 tablet 1   No current facility-administered medications for this visit.     Musculoskeletal: Strength & Muscle Tone: within normal limits Gait & Station: normal Patient leans: N/A  Psychiatric Specialty Exam: Review of Systems  Respiratory:  Negative for cough and shortness of breath.   Cardiovascular:  Negative for chest pain.  Gastrointestinal:  Negative for abdominal pain, constipation, diarrhea, nausea and vomiting.  Neurological:  Positive for headaches. Negative for dizziness and weakness.  Psychiatric/Behavioral:  Positive for  agitation, dysphoric mood and sleep disturbance. Negative for hallucinations and suicidal ideas. The patient is nervous/anxious.     Blood pressure (!) 155/103, pulse 80, height 5\' 7"  (1.702 m), weight 179 lb 12.8 oz (81.6 kg), SpO2 97 %.Body mass index is 28.16 kg/m.  General Appearance: Casual  Eye Contact:  Good  Speech:  Clear and Coherent and Normal Rate  Volume:  Normal  Mood:  Dysphoric  Affect:  Congruent  Thought Process:  Coherent and Goal Directed  Orientation:  Full (Time, Place, and Person)  Thought Content: WDL and Logical   Suicidal Thoughts:  No  Homicidal Thoughts:  No  Memory:  Immediate;   Good Recent;   Good  Judgement:  Good  Insight:  Good  Psychomotor Activity:  Normal  Concentration:  Concentration: Good and Attention Span: Good  Recall:  Good  Fund of Knowledge: Good  Language: Good  Akathisia:  Negative  Handed:  Right  AIMS (if indicated): not done  Assets:  Communication Skills Desire for Improvement Housing Resilience Social Support  ADL's:  Intact  Cognition: WNL  Sleep:  Poor   Screenings: PHQ2-9    Flowsheet Row Office Visit from 12/12/2021 in [facility] Office Visit from 11/14/2021 in [facility] Office Visit from 09/29/2021 in [facility]  PHQ-2 Total Score 6 6 6   PHQ-9 Total Score 18 21 22       Flowsheet Row Office Visit from 09/29/2021 in [facility] Admission (Discharged) from 09/09/2021 in [facility] 4E CV SURGICAL PROGRESSIVE CARE  ED from 07/23/2020 in [facility] EMERGENCY DEPARTMENT  C-SSRS RISK CATEGORY No Risk No Risk No Risk        Assessment and Plan:  Kauan Kloosterman is a 52 yr old male who presents for follow up and for medication management.  PPHx is significant for Depression, Anxiety, and Schizophrenia, multiple Suicide Attempts (Hanging, OD, put gun in his mouth, last ~10 yrs ago), and multiple Hospitalizations ([place], [facility] in [place], last  ~10 yrs ago), and no history of Self Injurious Behavior.  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      Miran has had some improvement with the Zyprexa.  He does still continue to have significant issues with sleep and irritability.  At this time we will increase his Remeron.  Given his significant issues with nightmares we would like to start prazosin however given his complex cardiac history and already being on 2 antihypertensives we will not start it at this time.  He has a follow-up with his PCP, Dr. Durwin Nora, on 2/7 at which time they will discuss prazosin and if there are no concerns we will start it at that time.  We will not make any other changes to his medications at this time.  He will return for follow-up in approximately 6 weeks.   Schizophrenia  GAD  PTSD  MDD: -Continue Zyprexa 5 mg for psychosis, anger issues, and augmentation.  60 tablets with 1 refill. -Increase Remeron to 30 mg QHS for depression, anxiety, insomnia, and appetite.  30 tablets with 1 refill. -Continue Hydroxyzine 25 mg TID PRN.  45 tablets with 0 refills. -Will discuss with PCP about Prazosin 1 mg QHS.    Collaboration of Care:   Patient/Guardian was advised Release of Information must be obtained prior to any record release in order to collaborate their care with an outside provider. Patient/Guardian was advised if they have not already done so to contact the registration department to sign all necessary forms in order for [place] to release information regarding their care.   Consent: Patient/Guardian gives verbal consent for treatment and assignment of benefits for services provided during this visit. Patient/Guardian expressed understanding and agreed to proceed.    Don Franklin, MD 02/22/2022, 3:34 PM

## 2022-03-02 ENCOUNTER — Ambulatory Visit: Payer: Medicaid Other | Admitting: Internal Medicine

## 2022-03-03 ENCOUNTER — Other Ambulatory Visit: Payer: Self-pay | Admitting: Physician Assistant

## 2022-03-03 ENCOUNTER — Encounter: Payer: Self-pay | Admitting: Nurse Practitioner

## 2022-03-03 ENCOUNTER — Ambulatory Visit: Payer: Medicaid Other | Attending: Internal Medicine | Admitting: Nurse Practitioner

## 2022-03-03 VITALS — BP 170/101 | HR 90 | Ht 67.0 in | Wt 177.8 lb

## 2022-03-03 DIAGNOSIS — E785 Hyperlipidemia, unspecified: Secondary | ICD-10-CM | POA: Diagnosis present

## 2022-03-03 DIAGNOSIS — I10 Essential (primary) hypertension: Secondary | ICD-10-CM

## 2022-03-03 DIAGNOSIS — I739 Peripheral vascular disease, unspecified: Secondary | ICD-10-CM

## 2022-03-03 DIAGNOSIS — Z720 Tobacco use: Secondary | ICD-10-CM | POA: Diagnosis present

## 2022-03-03 DIAGNOSIS — I2510 Atherosclerotic heart disease of native coronary artery without angina pectoris: Secondary | ICD-10-CM

## 2022-03-03 DIAGNOSIS — Z01810 Encounter for preprocedural cardiovascular examination: Secondary | ICD-10-CM | POA: Diagnosis present

## 2022-03-03 NOTE — Progress Notes (Unsigned)
Cardiology Office Note:    Date:  03/03/2022  ID:  Don PELOQUIN, DOB 1970/07/16, MRN [id number]  PCP:  Billie Lade, MD   [facility] Providers Cardiologist:  Dina Rich, MD     Referring MD: Billie Lade, MD   CC: Here for follow-up  History of Present Illness:    Don Fletcher is a 52 y.o. male with a hx of the following:  Family history of premature CAD CAD, status post STEMI Ischemic cardiomyopathy Hypertension PAD (follows vascular surgery) Tobacco abuse Bipolar 1, schizophrenic disorder History of substance abuse  Patient is a 52 year old male with past medical history as mentioned above.  Previous cardiovascular history includes inferolateral STEMI in 2015, status post bare-metal stent to RPL, positive functional study in 2016 status post RPL ISR with collaterals from left coronary artery.  He had inferolateral STEMI in 2018, status post left circumflex PCI and NSTEMI in 2020, status post distal RCA PCI extending into the PDA with EF at 55%.  Has history of peripheral arterial disease, status post right EIA stents and right SFA angioplasty in August 2023 on DAPT.  Last seen by Dr. Jenene Slicker on 11/30/2021.  Patient denied any chest pain.  He noted improvement in claudication symptoms, did note right intramuscular hematoma.  Was requesting preop cardiac clearance for bilateral hip surgery patient was requesting to have done.  He had cut down smoking.  Was referred to Pharm.D. for hyperlipidemia.  Stress test was arranged and revealed prior MI with peri-infarct ischemia, EKG was negative for ischemia, but small to intermediate sized, moderate intensity, apical to basal inferior defect with partial reversibility most prominently mid to apical distribution consistent with scar, mild to moderate peri-infarct ischemia in RCA distribution.  Nuclear stress EF 54%.  Underwent left heart cath on December 21, 2021 that showed angiographically stable coronary  arteries, widely patent left circumflex stents and distal RCA-PDA stents and known occlusion of RPA V-PL system with left-to-right collaterals, entire RCA was diffusely diseased, no focal lesion was noted.  PCI was not recommended.  Dr. Herbie Baltimore stated okay to proceed with upcoming surgery and okay to hold antiplatelet agent for surgery 7 days preop.  Today he presents for follow-up.  He states he has upcoming surgery, right total hip arthroplasty scheduled for February 7th, 2024 with Dr. Annell Greening. Our office has not received official clearance forms from the Ortho office. Doing well from a cardiac perspective. Denies any chest pain, shortness of breath, palpitations, syncope, presyncope, dizziness, orthopnea, PND, swelling or significant weight changes, acute bleeding, or claudication. Has cut down smoking to 1/2 PPD, trying to quit. Smokes weed, denies any other illicit drug use.   Past Medical History:  Diagnosis Date   Bipolar 1 disorder (HCC)    CAD (coronary artery disease)    a. 12/16/13 inferolat STEMI s/p BMS to RPL. b. Cath 08/2014: interim occlusion of the PLA stent of the RCA, collateralized from the left coronary artery with diffuse nonobstructive CAD;  c. STEMI/PCI: LM nl, LAD nl, LCX 154m/d (2.75x12 synergy DES), RCA 30p, RPDA 75ost/p, RPAV 100 w/ L->R collats.   Chronic back pain    HLD (hyperlipidemia)    Hypertension    Ischemic cardiomyopathy    a. 12/2013: EF 50-55% by echo with +WMA. b. EF 45% by cath in 08/2014.   Ischemic cardiomyopathy 05/19/2016   Peptic ulcer disease    Schizophrenic disorder (HCC)    Tobacco abuse     Past Surgical History:  Procedure Laterality Date   ABDOMINAL AORTOGRAM W/LOWER EXTREMITY Bilateral 09/09/2021   Procedure: ABDOMINAL AORTOGRAM W/LOWER EXTREMITY;  Surgeon: Angelia Mould, MD;  Location: [place] [facility];  Service: Cardiovascular;  Laterality: Bilateral;   CARDIAC CATHETERIZATION N/A 08/07/2014   Procedure: Left Heart Cath and  Coronary Angiography;  Surgeon: Sherren Mocha, MD;  Location: [place] [facility];  Service: Cardiovascular;  Laterality: N/A;   COLONOSCOPY N/A 06/06/2017   Procedure: COLONOSCOPY;  Surgeon: Rogene Houston, MD;  [facility];  Service: Endoscopy;  Laterality: N/A;  10:50   CORONARY STENT INTERVENTION N/A 05/19/2016   Procedure: Coronary Stent Intervention;  Surgeon: Lorretta Harp, MD;  Location: [place] [facility];  Service: Cardiovascular;  Laterality: N/A;   CORONARY STENT INTERVENTION N/A 07/02/2018   Procedure: CORONARY STENT INTERVENTION;  Surgeon: Belva Crome, MD;  Location: [place] [facility];  Service: Cardiovascular;  Laterality: N/A;   HERNIA REPAIR     LEFT HEART CATH AND CORONARY ANGIOGRAPHY N/A 05/19/2016   Procedure: Left Heart Cath and Coronary Angiography;  Surgeon: Lorretta Harp, MD;  Location: [place] [facility];  Service: Cardiovascular;  Laterality: N/A;   LEFT HEART CATH AND CORONARY ANGIOGRAPHY N/A 07/02/2018   Procedure: LEFT HEART CATH AND CORONARY ANGIOGRAPHY;  Surgeon: Belva Crome, MD;  Location: [place] [facility];  Service: Cardiovascular;  Laterality: N/A;   LEFT HEART CATH AND CORONARY ANGIOGRAPHY N/A 12/21/2021   Procedure: LEFT HEART CATH AND CORONARY ANGIOGRAPHY;  Surgeon: Leonie Man, MD;  Location: [place] [facility];  Service: Cardiovascular;  Laterality: N/A;   LEFT HEART CATHETERIZATION WITH CORONARY ANGIOGRAM N/A 12/16/2013   Procedure: LEFT HEART CATHETERIZATION WITH CORONARY ANGIOGRAM;  Surgeon: Jettie Booze, MD;  Location: [facility];  Service: Cardiovascular;  Laterality: N/A;   PERCUTANEOUS CORONARY STENT INTERVENTION (PCI-S)  12/16/2013   Procedure: PERCUTANEOUS CORONARY STENT INTERVENTION (PCI-S);  Surgeon: Jettie Booze, MD;  Location: [facility];  Service: Cardiovascular;;  distal rca   PERIPHERAL VASCULAR BALLOON ANGIOPLASTY  09/09/2021   Procedure: PERIPHERAL VASCULAR BALLOON ANGIOPLASTY;  Surgeon: Angelia Mould, MD;  Location: [place] [facility];  Service: Cardiovascular;;  Rt SFA   PERIPHERAL VASCULAR INTERVENTION  09/09/2021   Procedure: PERIPHERAL VASCULAR INTERVENTION;  Surgeon: Angelia Mould, MD;  Location: [place] [facility];  Service: Cardiovascular;;  Rt Iliac    Current Medications: Current Meds  Medication Sig   aspirin EC 81 MG tablet Take 1 tablet (81 mg total) by mouth daily. Swallow whole.   benazepril (LOTENSIN) 40 MG tablet Take 1 tablet (40 mg total) by mouth daily.   carvedilol (COREG) 6.25 MG tablet Take 1 tablet (6.25 mg total) by mouth 2 (two) times daily with a meal.   clopidogrel (PLAVIX) 75 MG tablet Take 1 tablet (75 mg total) by mouth daily.   ezetimibe (ZETIA) 10 MG tablet Take 1 tablet (10 mg total) by mouth daily.   folic acid (FOLVITE) 1 MG tablet Take 1 tablet (1 mg total) by mouth daily.   hydrOXYzine (ATARAX) 25 MG tablet Take 1 tablet (25 mg total) by mouth 3 (three) times daily as needed.   mirtazapine (REMERON) 30 MG tablet Take 1 tablet (30 mg total) by mouth at bedtime.   omeprazole (PRILOSEC) 40 MG capsule Take 40 mg by mouth daily.   pantoprazole (PROTONIX) 20 MG tablet Take 1 tablet (20 mg total) by mouth daily.   risperiDONE (RISPERDAL) 1 MG tablet  Take 1 mg by mouth 2 (two) times daily.     Allergies:   Suboxone [buprenorphine hcl-naloxone hcl], Benadryl [diphenhydramine], Seroquel [quetiapine], and Tylenol pm extra [diphenhydramine-apap (sleep)]   Social History   Socioeconomic History   Marital status: Married    Spouse name: Not on file   Number of children: Not on file   Years of education: Not on file   Highest education level: Not on file  Occupational History   Not on file  Tobacco Use   Smoking status: Every Day    Packs/day: 1.00    Years: 25.00    Total pack years: 25.00    Types: Cigarettes, Cigars    Start date: 05/25/1988   Smokeless tobacco: Former    Types: Snuff    Quit date: 05/21/1988  Vaping Use    Vaping Use: Never used  Substance and Sexual Activity   Alcohol use: No    Alcohol/week: 0.0 standard drinks of alcohol   Drug use: Yes    Types: Marijuana    Comment: last used today   Sexual activity: Not on file  Other Topics Concern   Not on file  Social History Narrative   Not on file   Social Determinants of Health   Financial Resource Strain: Not on file  Food Insecurity: Not on file  Transportation Needs: Not on file  Physical Activity: Inactive (06/30/2018)   Exercise Vital Sign    Days of Exercise per Week: 0 days    Minutes of Exercise per Session: 0 min  Stress: Stress Concern Present (06/30/2018)   [name]    Feeling of Stress : Rather much  Social Connections: Somewhat Isolated (06/30/2018)   Social Connection and Isolation Panel [NHANES]    Frequency of Communication with Friends and Family: More than three times a week    Frequency of Social Gatherings with Friends and Family: Never    Attends Religious Services: Never    Database administrator or Organizations: No    Attends Engineer, structural: Never    Marital Status: Married     Family History: The patient's family history includes Heart attack (age of onset: 79) in his brother and father.  ROS:   Review of Systems  Constitutional: Negative.   HENT: Negative.    Eyes: Negative.   Respiratory: Negative.    Cardiovascular: Negative.   Gastrointestinal: Negative.   Genitourinary: Negative.   Musculoskeletal:  Positive for joint pain. Negative for back pain, falls, myalgias and neck pain.  Skin: Negative.   Neurological: Negative.   Endo/Heme/Allergies: Negative.   Psychiatric/Behavioral: Negative.      Please see the history of present illness.    All other systems reviewed and are negative.  EKGs/Labs/Other Studies Reviewed:    The following studies were reviewed today:   EKG:  EKG is not ordered today.  EKG  reviewed dated 11/30/2021 and revealed NSR, no acute ischemic changes.    Left heart cath on 12/22/2021:   Prox RCA to Mid RCA lesion is 50% stenosed. Mid RCA lesion is 50% stenosed. Mid RCA to Dist RCA lesion is 50% stenosed.   RPAV lesion is 100% stenosed with side branch in 1st RPL. Previously Placed 1st PL stent is 100% stenosed.  The 1st PL fills distally via L-R collaterals   Previously placed Dist RCA stent is 10% stenosed.  Previously placed RPDA stent is 5% stenosed.   Previously placed Prox Cx to  Dist Cx stent of unknown type is  widely patent.   Prox LAD to Mid LAD lesion is 35% stenosed.     POST-OPERATIVE DIAGNOSIS:   Angiographically stable coronary arteries.   Widely patent LCx stents and distal RCA-PDA stents and known occlusion of the RPAV-PL system with left-to-right collaterals.   The entire RCA is diffusely diseased, but no focal lesion noted. Normal left ventriculogram with normal LVEDP.     PLAN OF CARE: Discharge to home after PACU - Cleared for Surgery  No reason for the patient not to proceed with surgery.  Would address existing coronary disease with medical management.  In the absence of symptoms, WOULD NOT recommend PCI. Okay to hold antiplatelet agent for surgery 7 days preop   Lexiscan on 12/08/2021:   Findings are consistent with prior myocardial infarction with peri-infarct ischemia. The study is low to intermediate risk.   No ST deviation was noted. The ECG was negative for ischemia.   LV perfusion is abnormal.  Small to intermediate sized, moderate intensity, apical to basal inferior defect with partial reversibility most prominently mid to apical distribution consistent with scar and mild to moderate peri-infarct ischemia in the RCA distribution.  Summed stress score is 11.   Left ventricular function is normal. Nuclear stress EF: 54 %. End diastolic cavity size is normal. End systolic cavity size is normal.   Low to intermediate risk study with evidence  of inferior infarct scar and mild to moderate peri-infarct ischemia, LVEF 54%.  Echo on 07/19/2017: Study Conclusions   - Left ventricle: The cavity size was normal. Wall thickness was    normal. The estimated ejection fraction was 55%. There is    hypokinesis of the basal-midinferolateral and inferior    myocardium. Left ventricular diastolic function parameters were    normal for the patient&'s age.  - Aortic valve: Mildly calcified annulus. Trileaflet.  - Mitral valve: There was trivial regurgitation.  - Right atrium: Central venous pressure (est): 3 mm Hg.  - Atrial septum: No defect or patent foramen ovale was identified.  - Tricuspid valve: There was trivial regurgitation.  - Pulmonary arteries: PA peak pressure: 14 mm Hg (S).  - Pericardium, extracardiac: There was no pericardial effusion.  Recent Labs: 09/29/2021: TSH 1.310 12/19/2021: BUN 12; Creatinine, Ser 1.08; Hemoglobin 17.2; Platelets 230; Potassium 4.5; Sodium 136  Recent Lipid Panel    Component Value Date/Time   CHOL 219 (H) 09/10/2021 0150   TRIG 128 09/10/2021 0150   HDL 25 (L) 09/10/2021 0150   CHOLHDL 8.8 09/10/2021 0150   VLDL 26 09/10/2021 0150   LDLCALC 168 (H) 09/10/2021 0150    Physical Exam:    VS:  BP (!) 170/101 (BP Location: Left Arm, Patient Position: Sitting, Cuff Size: Normal)   Pulse 90   Ht 5\' 7"  (1.702 m)   Wt 177 lb 12.8 oz (80.6 kg)   SpO2 96%   BMI 27.85 kg/m     Wt Readings from Last 3 Encounters:  03/03/22 177 lb 12.8 oz (80.6 kg)  01/25/22 176 lb (79.8 kg)  01/19/22 177 lb (80.3 kg)     GEN: Well nourished, well developed in no acute distress HEENT: Normal NECK: No JVD; No carotid bruits CARDIAC: S1/S2, RRR, no murmurs, rubs, gallops; 2+ pulses RESPIRATORY:  Clear to auscultation without rales, wheezing or rhonchi  ABDOMEN: Soft, non-tender, non-distended MUSCULOSKELETAL:  No edema; No deformity  SKIN: Warm and dry NEUROLOGIC:  Alert and oriented x 3 PSYCHIATRIC:   Normal  affect   ASSESSMENT:    1. Pre-operative cardiovascular examination   2. Coronary artery disease involving native coronary artery of native heart without angina pectoris   3. PAD (peripheral artery disease) (HCC)   4. Hyperlipidemia, unspecified hyperlipidemia type   5. Hypertension, unspecified type   6. Tobacco abuse    PLAN:    In order of problems listed above:  Pre-op cardiovascular risk assessment Mr. Dani perioperative risk of a major cardiac event is 6.6% according to the Revised Cardiac Risk Index (RCRI).  Therefore, he is at high risk for perioperative complications.   His functional capacity is good at 6.27 METs according to the Duke Activity Status Index (DASI). Recommendations: According to ACC/AHA guidelines, no further cardiovascular testing needed.  The patient may proceed to surgery at acceptable risk.   Antiplatelet and/or Anticoagulation Recommendations: Okay to hold Aspirin 7 days prior to surgery and may resume when it is safe to do so post-op. Pt is currently on Plavix d/t PAD surgery. Dr. Herbie Baltimore stated okay to hold antiplatelet 7 days prior to surgery on cath report on 12/2021. Recommend reaching out to Vascular Surgery about Plavix recommendations prior to hip surgery.   CAD, s/p STEMI with history of stenting Stable with no anginal symptoms. No indication for ischemic evaluation.  Continue current medication regimen. Continue f/u with clinical pharm D for PCSK9i. Heart healthy diet and regular cardiovascular exercise encouraged.   PAD He is s/p R EIA stent and angioplasty ot R SFA. Denies any issues. Continue medication management and continue follow-up with Vascular Surgery.   HLD Continue atorvastatin. Continue follow-up with clinical pharm D regarding PCSK9i. Heart healthy diet and regular cardiovascular exercise encouraged.   HTN BP on arrival 156/100, repeat BP 170/101.  BP well-controlled at home.  Discussed SBP goal is less than 130.  Given  BP log and discussed to monitor BP at home at least 2 hours after medications and sitting for 5-10 minutes.  Discussed to contact our office if SBP is consistently greater than 130 in the next few weeks.  He verbalized understanding.  Heart healthy diet and regular cardiovascular exercise encouraged.   Tobacco abuse Smoking cessation encouraged and discussed.  7. Disposition: Follow-up with Dr. Dina Rich in 6 months or sooner if anything changes.  Medication Adjustments/Labs and Tests Ordered: Current medicines are reviewed at length with the patient today.  Concerns regarding medicines are outlined above.  No orders of the defined types were placed in this encounter.  No orders of the defined types were placed in this encounter.   Patient Instructions  Medication Instructions:  Continue all current medications.   Labwork: none  Testing/Procedures: none  Follow-Up: 6 months - Dr. Wyline Mood   Any Other Special Instructions Will Be Listed Below (If Applicable). BP log  [place]   If you need a refill on your cardiac medications before your next appointment, please call your pharmacy.    Signed, Sharlene Dory, NP  03/05/2022 10:48 PM    [facility]

## 2022-03-03 NOTE — Patient Instructions (Signed)
Medication Instructions:  Continue all current medications.   Labwork: none  Testing/Procedures: none  Follow-Up: 6 months - Dr. Harl Bowie   Any Other Special Instructions Will Be Listed Below (If Applicable). BP log  [facility]   If you need a refill on your cardiac medications before your next appointment, please call your pharmacy.

## 2022-03-05 ENCOUNTER — Encounter: Payer: Self-pay | Admitting: Nurse Practitioner

## 2022-03-06 ENCOUNTER — Ambulatory Visit (INDEPENDENT_AMBULATORY_CARE_PROVIDER_SITE_OTHER): Payer: Medicaid Other | Admitting: Physician Assistant

## 2022-03-06 ENCOUNTER — Encounter: Payer: Self-pay | Admitting: Physician Assistant

## 2022-03-06 VITALS — BP 141/86 | HR 79 | Temp 98.7°F | Ht 67.0 in | Wt 177.0 lb

## 2022-03-06 DIAGNOSIS — M1611 Unilateral primary osteoarthritis, right hip: Secondary | ICD-10-CM

## 2022-03-06 NOTE — H&P (View-Only) (Signed)
TOTAL HIP ADMISSION H&P  Patient is admitted for right total hip arthroplasty.  Subjective:  Chief Complaint: right hip pain  HPI: Don Fletcher, 52 y.o. male, has a history of pain and functional disability in the right hip(s) due to arthritis and patient has failed non-surgical conservative treatments for greater than 12 weeks to include corticosteriod injections, flexibility and strengthening excercises, and weight reduction as appropriate.  Onset of symptoms was gradual starting 8 years ago with rapidlly worsening course since that time.The patient noted no past surgery on the right hip(s).  Patient currently rates pain in the right hip at 8 out of 10 with activity. Patient has night pain and worsening of pain with activity and weight bearing. Patient has evidence of subchondral sclerosis, periarticular osteophytes, and joint space narrowing by imaging studies. This condition presents safety issues increasing the risk of falls. This patient has had .  There is no current active infection.  Patient Active Problem List   Diagnosis Date Noted   Abnormal nuclear stress test 12/21/2021   Preop cardiovascular exam 12/21/2021   Lung nodule seen on imaging study 12/16/2021   GERD (gastroesophageal reflux disease) 12/16/2021   Folate deficiency 11/16/2021   Left shoulder pain 11/16/2021   Positive screening for depression on 9-item Patient Health Questionnaire (PHQ-9) 09/29/2021   Preventative health care 09/29/2021   Insomnia 09/29/2021   PAD (peripheral artery disease) (HCC) 09/09/2021   Unilateral primary osteoarthritis, right hip 07/21/2021   Encounter for smoking cessation counseling 03/04/2020   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   Cervical stenosis of spine 01/14/2020   Other headache syndrome 01/01/2020   Abnormal CT of the chest 01/01/2020   Syncope and collapse 12/19/2019   Bronchitis 12/19/2019   NSTEMI (non-ST elevated myocardial infarction) (HCC) 06/30/2018   Family hx of  colon cancer 02/14/2017   STEMI (ST elevation myocardial infarction) (HCC) 05/19/2016   Acute ST elevation myocardial infarction (STEMI) involving left circumflex coronary artery without development of Q waves (HCC) 05/19/2016   Ischemic cardiomyopathy 05/19/2016   Unstable angina (HCC) 08/06/2014   ST elevation myocardial infarction involving right coronary artery (HCC)    CAD (coronary artery disease)    Tobacco abuse    HLD (hyperlipidemia)    Hypertension    Family history of premature CAD 12/16/2013   CAD S/P PCI:  2.0 x 23 vision bare metal stent (2.25 mm) RPL 12/16/2013   Schizophrenic disorder (HCC)    Bipolar 1 disorder (HCC)    Chest pain 06/24/2013   Past Medical History:  Diagnosis Date   Bipolar 1 disorder (HCC)    CAD (coronary artery disease)    a. 12/16/13 inferolat STEMI s/p BMS to RPL. b. Cath 08/2014: interim occlusion of the PLA stent of the RCA, collateralized from the left coronary artery with diffuse nonobstructive CAD;  c. STEMI/PCI: LM nl, LAD nl, LCX 152m/d (2.75x12 synergy DES), RCA 30p, RPDA 75ost/p, RPAV 100 w/ L->R collats.   Chronic back pain    HLD (hyperlipidemia)    Hypertension    Ischemic cardiomyopathy    a. 12/2013: EF 50-55% by echo with +WMA. b. EF 45% by cath in 08/2014.   Ischemic cardiomyopathy 05/19/2016   Peptic ulcer disease    Schizophrenic disorder (HCC)    Tobacco abuse     Past Surgical History:  Procedure Laterality Date   ABDOMINAL AORTOGRAM W/LOWER EXTREMITY Bilateral 09/09/2021   Procedure: ABDOMINAL AORTOGRAM W/LOWER EXTREMITY;  Surgeon: Chuck Hint, MD;  Location: [facility] INVASIVE CV LAB;  Service: Cardiovascular;  Laterality: Bilateral;   CARDIAC CATHETERIZATION N/A 08/07/2014   Procedure: Left Heart Cath and Coronary Angiography;  Surgeon: Sherren Mocha, MD;  Location: [place] CV LAB;  Service: Cardiovascular;  Laterality: N/A;   COLONOSCOPY N/A 06/06/2017   Procedure: COLONOSCOPY;  Surgeon: Rogene Houston, MD;   Location: [facility]O [facility];  Service: Endoscopy;  Laterality: N/A;  10:50   CORONARY STENT INTERVENTION N/A 05/19/2016   Procedure: Coronary Stent Intervention;  Surgeon: Lorretta Harp, MD;  Location: [place] CV LAB;  Service: Cardiovascular;  Laterality: N/A;   CORONARY STENT INTERVENTION N/A 07/02/2018   Procedure: CORONARY STENT INTERVENTION;  Surgeon: Belva Crome, MD;  Location: [place] CV LAB;  Service: Cardiovascular;  Laterality: N/A;   HERNIA REPAIR     LEFT HEART CATH AND CORONARY ANGIOGRAPHY N/A 05/19/2016   Procedure: Left Heart Cath and Coronary Angiography;  Surgeon: Lorretta Harp, MD;  Location: [place] [facility] LAB;  Service: Cardiovascular;  Laterality: N/A;   LEFT HEART CATH AND CORONARY ANGIOGRAPHY N/A 07/02/2018   Procedure: LEFT HEART CATH AND CORONARY ANGIOGRAPHY;  Surgeon: Belva Crome, MD;  Location: [place] CV LAB;  Service: Cardiovascular;  Laterality: N/A;   LEFT HEART CATH AND CORONARY ANGIOGRAPHY N/A 12/21/2021   Procedure: LEFT HEART CATH AND CORONARY ANGIOGRAPHY;  Surgeon: Leonie Man, MD;  Location: [place] CV LAB;  Service: Cardiovascular;  Laterality: N/A;   LEFT HEART CATHETERIZATION WITH CORONARY ANGIOGRAM N/A 12/16/2013   Procedure: LEFT HEART CATHETERIZATION WITH CORONARY ANGIOGRAM;  Surgeon: Jettie Booze, MD;  Location: [facility] CATH LAB;  Service: Cardiovascular;  Laterality: N/A;   PERCUTANEOUS CORONARY STENT INTERVENTION (PCI-S)  12/16/2013   Procedure: PERCUTANEOUS CORONARY STENT INTERVENTION (PCI-S);  Surgeon: Jettie Booze, MD;  Location: [facility] CATH LAB;  Service: Cardiovascular;;  distal rca   PERIPHERAL VASCULAR BALLOON ANGIOPLASTY  09/09/2021   Procedure: PERIPHERAL VASCULAR BALLOON ANGIOPLASTY;  Surgeon: Angelia Mould, MD;  Location: [place] CV LAB;  Service: Cardiovascular;;  Rt SFA   PERIPHERAL VASCULAR INTERVENTION  09/09/2021   Procedure: PERIPHERAL VASCULAR INTERVENTION;  Surgeon: Angelia Mould, MD;   Location: [place] CV LAB;  Service: Cardiovascular;;  Rt Iliac    Current Outpatient Medications  Medication Sig Dispense Refill Last Dose   aspirin EC 81 MG tablet Take 1 tablet (81 mg total) by mouth daily. Swallow whole. 150 tablet 2    atorvastatin (LIPITOR) 80 MG tablet TAKE 1 TABLET ONCE DAILY. (Patient not taking: Reported on 03/03/2022) 30 tablet 6    benazepril (LOTENSIN) 40 MG tablet Take 1 tablet (40 mg total) by mouth daily. 90 tablet 3    carvedilol (COREG) 6.25 MG tablet Take 1 tablet (6.25 mg total) by mouth 2 (two) times daily with a meal. 60 tablet 3    clopidogrel (PLAVIX) 75 MG tablet Take 1 tablet (75 mg total) by mouth daily. 30 tablet 11    ezetimibe (ZETIA) 10 MG tablet Take 1 tablet (10 mg total) by mouth daily. 30 tablet 11    folic acid (FOLVITE) 1 MG tablet Take 1 tablet (1 mg total) by mouth daily. 90 tablet 1    hydrOXYzine (ATARAX) 25 MG tablet Take 1 tablet (25 mg total) by mouth 3 (three) times daily as needed. 45 tablet 0    mirtazapine (REMERON) 30 MG tablet Take 1 tablet (30 mg total) by mouth at bedtime. 30 tablet 1    OLANZapine (ZYPREXA) 5 MG tablet Take 1 tablet (  5 mg total) by mouth at bedtime. (Patient not taking: Reported on 03/03/2022) 30 tablet 1    omeprazole (PRILOSEC) 40 MG capsule Take 40 mg by mouth daily.      pantoprazole (PROTONIX) 20 MG tablet Take 1 tablet (20 mg total) by mouth daily. 90 tablet 1    risperiDONE (RISPERDAL) 1 MG tablet Take 1 mg by mouth 2 (two) times daily.      No current facility-administered medications for this visit.   Allergies  Allergen Reactions   Suboxone [Buprenorphine Hcl-Naloxone Hcl] Other (See Comments)    Caused kidney failure   Benadryl [Diphenhydramine] Other (See Comments)    LEG CRAMPING   Seroquel [Quetiapine] Other (See Comments)    "makes my legs cramp"   Tylenol Pm Extra [Diphenhydramine-Apap (Sleep)] Other (See Comments)    "makes my leg cramp"    Social History   Tobacco Use   Smoking  status: Every Day    Packs/day: 1.00    Years: 25.00    Total pack years: 25.00    Types: Cigarettes, Cigars    Start date: 05/25/1988   Smokeless tobacco: Former    Types: Snuff    Quit date: 05/21/1988  Substance Use Topics   Alcohol use: No    Alcohol/week: 0.0 standard drinks of alcohol    Family History  Problem Relation Age of Onset   Heart attack Father 23   Heart attack Brother 40     Review of Systems  All other systems reviewed and are negative.   Objective:  Physical Exam         Objective: Vital Signs: Ht 5\' 7"  (1.702 m)   Wt 177 lb (80.3 kg)   BMI 27.72 kg/m    Physical Exam Constitutional:      Appearance: He is well-developed.  HENT:     Head: Normocephalic and atraumatic.     Right Ear: External ear normal.     Left Ear: External ear normal.  Eyes:     Pupils: Pupils are equal, round, and reactive to light.  Neck:     Thyroid: No thyromegaly.     Trachea: No tracheal deviation.  Cardiovascular:     Rate and Rhythm: Normal rate.  Pulmonary:     Effort: Pulmonary effort is normal.     Breath sounds: No wheezing.  Abdominal:     General: Bowel sounds are normal.     Palpations: Abdomen is soft.  Musculoskeletal:     Cervical back: Neck supple.  Skin:    General: Skin is warm and dry.     Capillary Refill: Capillary refill takes less than 2 seconds.  Neurological:     Mental Status: He is alert and oriented to person, place, and time.  Psychiatric:        Behavior: Behavior normal.        Thought Content: Thought content normal.        Judgment: Judgment normal.    Vital signs in last 24 hours: @VSRANGES @  Labs:   Estimated body mass index is 27.72 kg/m as calculated from the following:   Height as of this encounter: 5\' 7"  (1.702 m).   Weight as of this encounter: 177 lb (80.3 kg). Ortho Exam patient has 15 degrees internal rotation right hip with reproduction of hip pain.  Negative straight leg raising 90 degrees negative  popliteal compression test.  He is amatory with a cane he also has a walker at home.  Positive Trendelenburg gait.  Pedal pulses dorsalis pedis posterior tib are palpable bilaterally.   Imaging Review Plain radiographs demonstrate severe degenerative joint disease of the right hip(s). The bone quality appears to be good for age and reported activity level.      Assessment/Plan:  End stage arthritis, right hip(s)  The patient history, physical examination, clinical judgement of the provider and imaging studies are consistent with end stage degenerative joint disease of the right hip(s) and total hip arthroplasty is deemed medically necessary. The treatment options including medical management, injection therapy, arthroscopy and arthroplasty were discussed at length. The risks and benefits of total hip arthroplasty were presented and reviewed. The risks due to aseptic loosening, infection, stiffness, dislocation/subluxation,  thromboembolic complications and other imponderables were discussed.  The patient acknowledged the explanation, agreed to proceed with the plan and consent was signed. Patient is being admitted for inpatient treatment for surgery, pain control, PT, OT, prophylactic antibiotics, VTE prophylaxis, progressive ambulation and ADL's and discharge planning.The patient is planning to be discharged home with home health services    Patient's anticipated LOS is less than 2 midnights, meeting these requirements: - Younger than 63 - Lives within 1 hour of care - Has a competent adult at home to recover with post-op recover - NO history of  - Chronic pain requiring opiods  - Diabetes  - Coronary Artery Disease  - Heart failure  - Heart attack  - Stroke  - DVT/VTE  - Cardiac arrhythmia  - Respiratory Failure/COPD  - Renal failure  - Anemia  - Advanced Liver disease

## 2022-03-06 NOTE — H&P (Signed)
TOTAL HIP ADMISSION H&P  Patient is admitted for right total hip arthroplasty.  Subjective:  Chief Complaint: right hip pain  HPI: Don Fletcher, 52 y.o. male, has a history of pain and functional disability in the right hip(s) due to arthritis and patient has failed non-surgical conservative treatments for greater than 12 weeks to include corticosteriod injections, flexibility and strengthening excercises, and weight reduction as appropriate.  Onset of symptoms was gradual starting 8 years ago with rapidlly worsening course since that time.The patient noted no past surgery on the right hip(s).  Patient currently rates pain in the right hip at 8 out of 10 with activity. Patient has night pain and worsening of pain with activity and weight bearing. Patient has evidence of subchondral sclerosis, periarticular osteophytes, and joint space narrowing by imaging studies. This condition presents safety issues increasing the risk of falls. This patient has had .  There is no current active infection.  Patient Active Problem List   Diagnosis Date Noted   Abnormal nuclear stress test 12/21/2021   Preop cardiovascular exam 12/21/2021   Lung nodule seen on imaging study 12/16/2021   GERD (gastroesophageal reflux disease) 12/16/2021   Folate deficiency 11/16/2021   Left shoulder pain 11/16/2021   Positive screening for depression on 9-item Patient Health Questionnaire (PHQ-9) 09/29/2021   Preventative health care 09/29/2021   Insomnia 09/29/2021   PAD (peripheral artery disease) (HCC) 09/09/2021   Unilateral primary osteoarthritis, right hip 07/21/2021   Encounter for smoking cessation counseling 03/04/2020   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   Cervical stenosis of spine 01/14/2020   Other headache syndrome 01/01/2020   Abnormal CT of the chest 01/01/2020   Syncope and collapse 12/19/2019   Bronchitis 12/19/2019   NSTEMI (non-ST elevated myocardial infarction) (HCC) 06/30/2018   Family hx of  colon cancer 02/14/2017   STEMI (ST elevation myocardial infarction) (HCC) 05/19/2016   Acute ST elevation myocardial infarction (STEMI) involving left circumflex coronary artery without development of Q waves (HCC) 05/19/2016   Ischemic cardiomyopathy 05/19/2016   Unstable angina (HCC) 08/06/2014   ST elevation myocardial infarction involving right coronary artery (HCC)    CAD (coronary artery disease)    Tobacco abuse    HLD (hyperlipidemia)    Hypertension    Family history of premature CAD 12/16/2013   CAD S/P PCI:  2.0 x 23 vision bare metal stent (2.25 mm) RPL 12/16/2013   Schizophrenic disorder (HCC)    Bipolar 1 disorder (HCC)    Chest pain 06/24/2013   Past Medical History:  Diagnosis Date   Bipolar 1 disorder (HCC)    CAD (coronary artery disease)    a. 12/16/13 inferolat STEMI s/p BMS to RPL. b. Cath 08/2014: interim occlusion of the PLA stent of the RCA, collateralized from the left coronary artery with diffuse nonobstructive CAD;  c. STEMI/PCI: LM nl, LAD nl, LCX 152m/d (2.75x12 synergy DES), RCA 30p, RPDA 75ost/p, RPAV 100 w/ L->R collats.   Chronic back pain    HLD (hyperlipidemia)    Hypertension    Ischemic cardiomyopathy    a. 12/2013: EF 50-55% by echo with +WMA. b. EF 45% by cath in 08/2014.   Ischemic cardiomyopathy 05/19/2016   Peptic ulcer disease    Schizophrenic disorder (HCC)    Tobacco abuse     Past Surgical History:  Procedure Laterality Date   ABDOMINAL AORTOGRAM W/LOWER EXTREMITY Bilateral 09/09/2021   Procedure: ABDOMINAL AORTOGRAM W/LOWER EXTREMITY;  Surgeon: Chuck Hint, MD;  Location: [facility] INVASIVE CV LAB;  Service: Cardiovascular;  Laterality: Bilateral;   CARDIAC CATHETERIZATION N/A 08/07/2014   Procedure: Left Heart Cath and Coronary Angiography;  Surgeon: Michael Cooper, MD;  Location: [facility] INVASIVE CV LAB;  Service: Cardiovascular;  Laterality: N/A;   COLONOSCOPY N/A 06/06/2017   Procedure: COLONOSCOPY;  Surgeon: Rehman, Najeeb U, MD;   Location: [facility];  Service: Endoscopy;  Laterality: N/A;  10:50   CORONARY STENT INTERVENTION N/A 05/19/2016   Procedure: Coronary Stent Intervention;  Surgeon: Jonathan J Berry, MD;  Location: [facility] INVASIVE CV LAB;  Service: Cardiovascular;  Laterality: N/A;   CORONARY STENT INTERVENTION N/A 07/02/2018   Procedure: CORONARY STENT INTERVENTION;  Surgeon: Smith, Henry W, MD;  Location: [facility] INVASIVE CV LAB;  Service: Cardiovascular;  Laterality: N/A;   HERNIA REPAIR     LEFT HEART CATH AND CORONARY ANGIOGRAPHY N/A 05/19/2016   Procedure: Left Heart Cath and Coronary Angiography;  Surgeon: Jonathan J Berry, MD;  Location: [facility] INVASIVE CV LAB;  Service: Cardiovascular;  Laterality: N/A;   LEFT HEART CATH AND CORONARY ANGIOGRAPHY N/A 07/02/2018   Procedure: LEFT HEART CATH AND CORONARY ANGIOGRAPHY;  Surgeon: Smith, Henry W, MD;  Location: [facility] INVASIVE CV LAB;  Service: Cardiovascular;  Laterality: N/A;   LEFT HEART CATH AND CORONARY ANGIOGRAPHY N/A 12/21/2021   Procedure: LEFT HEART CATH AND CORONARY ANGIOGRAPHY;  Surgeon: Harding, David W, MD;  Location: [facility] INVASIVE CV LAB;  Service: Cardiovascular;  Laterality: N/A;   LEFT HEART CATHETERIZATION WITH CORONARY ANGIOGRAM N/A 12/16/2013   Procedure: LEFT HEART CATHETERIZATION WITH CORONARY ANGIOGRAM;  Surgeon: Jayadeep S Varanasi, MD;  Location: [facility] CATH LAB;  Service: Cardiovascular;  Laterality: N/A;   PERCUTANEOUS CORONARY STENT INTERVENTION (PCI-S)  12/16/2013   Procedure: PERCUTANEOUS CORONARY STENT INTERVENTION (PCI-S);  Surgeon: Jayadeep S Varanasi, MD;  Location: [facility] CATH LAB;  Service: Cardiovascular;;  distal rca   PERIPHERAL VASCULAR BALLOON ANGIOPLASTY  09/09/2021   Procedure: PERIPHERAL VASCULAR BALLOON ANGIOPLASTY;  Surgeon: Dickson, Christopher S, MD;  Location: [facility] INVASIVE CV LAB;  Service: Cardiovascular;;  Rt SFA   PERIPHERAL VASCULAR INTERVENTION  09/09/2021   Procedure: PERIPHERAL VASCULAR INTERVENTION;  Surgeon: Dickson, Christopher S, MD;   Location: [facility] INVASIVE CV LAB;  Service: Cardiovascular;;  Rt Iliac    Current Outpatient Medications  Medication Sig Dispense Refill Last Dose   aspirin EC 81 MG tablet Take 1 tablet (81 mg total) by mouth daily. Swallow whole. 150 tablet 2    atorvastatin (LIPITOR) 80 MG tablet TAKE 1 TABLET ONCE DAILY. (Patient not taking: Reported on 03/03/2022) 30 tablet 6    benazepril (LOTENSIN) 40 MG tablet Take 1 tablet (40 mg total) by mouth daily. 90 tablet 3    carvedilol (COREG) 6.25 MG tablet Take 1 tablet (6.25 mg total) by mouth 2 (two) times daily with a meal. 60 tablet 3    clopidogrel (PLAVIX) 75 MG tablet Take 1 tablet (75 mg total) by mouth daily. 30 tablet 11    ezetimibe (ZETIA) 10 MG tablet Take 1 tablet (10 mg total) by mouth daily. 30 tablet 11    folic acid (FOLVITE) 1 MG tablet Take 1 tablet (1 mg total) by mouth daily. 90 tablet 1    hydrOXYzine (ATARAX) 25 MG tablet Take 1 tablet (25 mg total) by mouth 3 (three) times daily as needed. 45 tablet 0    mirtazapine (REMERON) 30 MG tablet Take 1 tablet (30 mg total) by mouth at bedtime. 30 tablet 1    OLANZapine (ZYPREXA) 5 MG tablet Take 1 tablet (  5 mg total) by mouth at bedtime. (Patient not taking: Reported on 03/03/2022) 30 tablet 1    omeprazole (PRILOSEC) 40 MG capsule Take 40 mg by mouth daily.      pantoprazole (PROTONIX) 20 MG tablet Take 1 tablet (20 mg total) by mouth daily. 90 tablet 1    risperiDONE (RISPERDAL) 1 MG tablet Take 1 mg by mouth 2 (two) times daily.      No current facility-administered medications for this visit.   Allergies  Allergen Reactions   Suboxone [Buprenorphine Hcl-Naloxone Hcl] Other (See Comments)    Caused kidney failure   Benadryl [Diphenhydramine] Other (See Comments)    LEG CRAMPING   Seroquel [Quetiapine] Other (See Comments)    "makes my legs cramp"   Tylenol Pm Extra [Diphenhydramine-Apap (Sleep)] Other (See Comments)    "makes my leg cramp"    Social History   Tobacco Use   Smoking  status: Every Day    Packs/day: 1.00    Years: 25.00    Total pack years: 25.00    Types: Cigarettes, Cigars    Start date: 05/25/1988   Smokeless tobacco: Former    Types: Snuff    Quit date: 05/21/1988  Substance Use Topics   Alcohol use: No    Alcohol/week: 0.0 standard drinks of alcohol    Family History  Problem Relation Age of Onset   Heart attack Father 23   Heart attack Brother 40     Review of Systems  All other systems reviewed and are negative.   Objective:  Physical Exam         Objective: Vital Signs: Ht 5\' 7"  (1.702 m)   Wt 177 lb (80.3 kg)   BMI 27.72 kg/m    Physical Exam Constitutional:      Appearance: He is well-developed.  HENT:     Head: Normocephalic and atraumatic.     Right Ear: External ear normal.     Left Ear: External ear normal.  Eyes:     Pupils: Pupils are equal, round, and reactive to light.  Neck:     Thyroid: No thyromegaly.     Trachea: No tracheal deviation.  Cardiovascular:     Rate and Rhythm: Normal rate.  Pulmonary:     Effort: Pulmonary effort is normal.     Breath sounds: No wheezing.  Abdominal:     General: Bowel sounds are normal.     Palpations: Abdomen is soft.  Musculoskeletal:     Cervical back: Neck supple.  Skin:    General: Skin is warm and dry.     Capillary Refill: Capillary refill takes less than 2 seconds.  Neurological:     Mental Status: He is alert and oriented to person, place, and time.  Psychiatric:        Behavior: Behavior normal.        Thought Content: Thought content normal.        Judgment: Judgment normal.    Vital signs in last 24 hours: @VSRANGES @  Labs:   Estimated body mass index is 27.72 kg/m as calculated from the following:   Height as of this encounter: 5\' 7"  (1.702 m).   Weight as of this encounter: 177 lb (80.3 kg). Ortho Exam patient has 15 degrees internal rotation right hip with reproduction of hip pain.  Negative straight leg raising 90 degrees negative  popliteal compression test.  He is amatory with a cane he also has a walker at home.  Positive Trendelenburg gait.  Pedal pulses dorsalis pedis posterior tib are palpable bilaterally.   Imaging Review Plain radiographs demonstrate severe degenerative joint disease of the right hip(s). The bone quality appears to be good for age and reported activity level.      Assessment/Plan:  End stage arthritis, right hip(s)  The patient history, physical examination, clinical judgement of the provider and imaging studies are consistent with end stage degenerative joint disease of the right hip(s) and total hip arthroplasty is deemed medically necessary. The treatment options including medical management, injection therapy, arthroscopy and arthroplasty were discussed at length. The risks and benefits of total hip arthroplasty were presented and reviewed. The risks due to aseptic loosening, infection, stiffness, dislocation/subluxation,  thromboembolic complications and other imponderables were discussed.  The patient acknowledged the explanation, agreed to proceed with the plan and consent was signed. Patient is being admitted for inpatient treatment for surgery, pain control, PT, OT, prophylactic antibiotics, VTE prophylaxis, progressive ambulation and ADL's and discharge planning.The patient is planning to be discharged home with home health services    Patient's anticipated LOS is less than 2 midnights, meeting these requirements: - Younger than 63 - Lives within 1 hour of care - Has a competent adult at home to recover with post-op recover - NO history of  - Chronic pain requiring opiods  - Diabetes  - Coronary Artery Disease  - Heart failure  - Heart attack  - Stroke  - DVT/VTE  - Cardiac arrhythmia  - Respiratory Failure/COPD  - Renal failure  - Anemia  - Advanced Liver disease

## 2022-03-06 NOTE — Progress Notes (Signed)
Office Visit Note   Patient: Don Fletcher           Date of Birth: 1970/11/11           MRN: [id number] Visit Date: 03/06/2022              Requested by: Billie Lade, MD [address] [place],  [place] [address] PCP: Billie Lade, MD  Chief Complaint  Patient presents with   Right Hip - Pain      HPI: Don Fletcher is a pleasant 52 year old gentleman who comes in today for his preoperative history and physical for his upcoming right total hip arthroplasty on February 7 with Dr. Ophelia Charter.  He is a smoker but has cut back from 2 packs to a half a pack per day.  He does have a significant cardiac history but has had a cardiac clearance by his cardiologist.  He has also been told that is fine for him per the report to stop his Plavix 7 days before surgery.  Denies any recent illness he has failed treatment of this painful problem  Assessment & Plan: Visit Diagnoses: Arthritis right hip  Plan: I reviewed the risks of him with surgery including bleeding infection anesthesia complications neurovascular damage.  Asked that he try to cut back on smoking is much as he possibly can during the perioperative period.  He will have his preop later this week.  I did say that he according to the notes can stop his Plavix 1 week before surgery and he is aware of this.  Can contact us with any other questions.  Full H&P dictated into the hospital system  Follow-Up Instructions: Return 1 week post op in [place].   Ortho Exam  Patient is alert, oriented, no adenopathy, well-dressed, normal affect, normal respiratory effort. Ortho Exam patient has 15 degrees internal rotation right hip with reproduction of hip pain.  Negative straight leg raising 90 degrees negative popliteal compression test.  He is amatory with a cane he also has a walker at home.  Positive Trendelenburg gait.  Pedal pulses dorsalis pedis posterior tib are palpable bilaterally.    Imaging: No results found. No images are  attached to the encounter.  Labs: Lab Results  Component Value Date   HGBA1C 5.6 09/29/2021   HGBA1C 5.4 05/19/2016   HGBA1C 5.5 05/19/2016     Lab Results  Component Value Date   ALBUMIN 3.8 07/23/2020   ALBUMIN 3.8 10/31/2019   ALBUMIN 3.9 10/28/2019    Lab Results  Component Value Date   MG 2.1 06/30/2018   MG 1.8 05/19/2016   No results found for: "VD25OH"  No results found for: "PREALBUMIN"    Latest Ref Rng & Units 12/19/2021   11:35 AM 09/09/2021    8:11 AM 07/23/2020   11:48 AM  CBC EXTENDED  WBC 4.0 - 10.5 K/uL 7.6   9.4   RBC 4.22 - 5.81 MIL/uL 5.36   5.13   Hemoglobin 13.0 - 17.0 g/dL 53.2  99.2  42.6   HCT 39.0 - 52.0 % 48.5  49.0  48.0   Platelets 150 - 400 K/uL 230   231      Body mass index is 27.72 kg/m.  Orders:  No orders of the defined types were placed in this encounter.  No orders of the defined types were placed in this encounter.    Procedures: No procedures performed  Clinical Data: No additional findings.  ROS:  All  other systems negative, except as noted in the HPI. Review of Systems  Objective: Vital Signs: BP (!) 141/86   Pulse 79   Temp 98.7 F (37.1 C)   Ht 5\' 7"  (1.702 m)   Wt 177 lb (80.3 kg)   SpO2 95%   BMI 27.72 kg/m   Specialty Comments:  No specialty comments available.  PMFS History: Patient Active Problem List   Diagnosis Date Noted   Abnormal nuclear stress test 12/21/2021   Preop cardiovascular exam 12/21/2021   Lung nodule seen on imaging study 12/16/2021   GERD (gastroesophageal reflux disease) 12/16/2021   Folate deficiency 11/16/2021   Left shoulder pain 11/16/2021   Positive screening for depression on 9-item Patient Health Questionnaire (PHQ-9) 09/29/2021   Preventative health care 09/29/2021   Insomnia 09/29/2021   PAD (peripheral artery disease) ([place]) 09/09/2021   Unilateral primary osteoarthritis, right hip 07/21/2021   Encounter for smoking cessation counseling 03/04/2020    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   Cervical stenosis of spine 01/14/2020   Other headache syndrome 01/01/2020   Abnormal CT of the chest 01/01/2020   Syncope and collapse 12/19/2019   Bronchitis 12/19/2019   NSTEMI (non-ST elevated myocardial infarction) ([place]) 06/30/2018   Family hx of colon cancer 02/14/2017   STEMI (ST elevation myocardial infarction) ([place]) 05/19/2016   Acute ST elevation myocardial infarction (STEMI) involving left circumflex coronary artery without development of Q waves ([place]) 05/19/2016   Ischemic cardiomyopathy 05/19/2016   Unstable angina ([place]) 08/06/2014   ST elevation myocardial infarction involving right coronary artery (HCC)    CAD (coronary artery disease)    Tobacco abuse    HLD (hyperlipidemia)    Hypertension    Family history of premature CAD 12/16/2013   CAD S/P PCI:  2.0 x 23 vision bare metal stent (2.25 mm) RPL 12/16/2013    Class: Acute   Schizophrenic disorder ([place])    Bipolar 1 disorder ([place])    Chest pain 06/24/2013   Past Medical History:  Diagnosis Date   Bipolar 1 disorder ([place])    CAD (coronary artery disease)    a. 12/16/13 inferolat STEMI s/p BMS to RPL. b. Cath 08/2014: interim occlusion of the PLA stent of the RCA, collateralized from the left coronary artery with diffuse nonobstructive CAD;  c. STEMI/PCI: LM nl, LAD nl, LCX 161m/d (2.75x12 synergy DES), RCA 30p, RPDA 75ost/p, RPAV 100 w/ L->R collats.   Chronic back pain    HLD (hyperlipidemia)    Hypertension    Ischemic cardiomyopathy    a. 12/2013: EF 50-55% by echo with +WMA. b. EF 45% by cath in 08/2014.   Ischemic cardiomyopathy 05/19/2016   Peptic ulcer disease    Schizophrenic disorder ([place])    Tobacco abuse     Family History  Problem Relation Age of Onset   Heart attack Father 16   Heart attack Brother 108    Past Surgical History:  Procedure Laterality Date   ABDOMINAL AORTOGRAM W/LOWER EXTREMITY Bilateral 09/09/2021   Procedure: ABDOMINAL AORTOGRAM W/LOWER  EXTREMITY;  Surgeon: Angelia Mould, MD;  Location: [facility];  Service: Cardiovascular;  Laterality: Bilateral;   CARDIAC CATHETERIZATION N/A 08/07/2014   Procedure: Left Heart Cath and Coronary Angiography;  Surgeon: Sherren Mocha, MD;  Location: [facility];  Service: Cardiovascular;  Laterality: N/A;   COLONOSCOPY N/A 06/06/2017   Procedure: COLONOSCOPY;  Surgeon: Rogene Houston, MD;  [facility];  Service: Endoscopy;  Laterality: N/A;  10:50   CORONARY  STENT INTERVENTION N/A 05/19/2016   Procedure: Coronary Stent Intervention;  Surgeon: Lorretta Harp, MD;  Location: [facility];  Service: Cardiovascular;  Laterality: N/A;   CORONARY STENT INTERVENTION N/A 07/02/2018   Procedure: CORONARY STENT INTERVENTION;  Surgeon: Belva Crome, MD;  Location: [facility];  Service: Cardiovascular;  Laterality: N/A;   HERNIA REPAIR     LEFT HEART CATH AND CORONARY ANGIOGRAPHY N/A 05/19/2016   Procedure: Left Heart Cath and Coronary Angiography;  Surgeon: Lorretta Harp, MD;  Location: [facility];  Service: Cardiovascular;  Laterality: N/A;   LEFT HEART CATH AND CORONARY ANGIOGRAPHY N/A 07/02/2018   Procedure: LEFT HEART CATH AND CORONARY ANGIOGRAPHY;  Surgeon: Belva Crome, MD;  Location: [facility];  Service: Cardiovascular;  Laterality: N/A;   LEFT HEART CATH AND CORONARY ANGIOGRAPHY N/A 12/21/2021   Procedure: LEFT HEART CATH AND CORONARY ANGIOGRAPHY;  Surgeon: Leonie Man, MD;  Location: [facility];  Service: Cardiovascular;  Laterality: N/A;   LEFT HEART CATHETERIZATION WITH CORONARY ANGIOGRAM N/A 12/16/2013   Procedure: LEFT HEART CATHETERIZATION WITH CORONARY ANGIOGRAM;  Surgeon: Jettie Booze, MD;  Location: [facility] C[facility];  Service: Cardiovascular;  Laterality: N/A;   PERCUTANEOUS CORONARY STENT INTERVENTION (PCI-S)  12/16/2013   Procedure: PERCUTANEOUS CORONARY STENT INTERVENTION (PCI-S);  Surgeon: Jettie Booze, MD;  Location: [facility] CATH LAB;  Service: Cardiovascular;;  distal rca   PERIPHERAL VASCULAR BALLOON ANGIOPLASTY  09/09/2021   Procedure: PERIPHERAL VASCULAR BALLOON ANGIOPLASTY;  Surgeon: Angelia Mould, MD;  Location: [facility];  Service: Cardiovascular;;  Rt SFA   PERIPHERAL VASCULAR INTERVENTION  09/09/2021   Procedure: PERIPHERAL VASCULAR INTERVENTION;  Surgeon: Angelia Mould, MD;  Location: [facility];  Service: Cardiovascular;;  Rt Iliac   Social History   Occupational History   Not on file  Tobacco Use   Smoking status: Every Day    Packs/day: 1.00    Years: 25.00    Total pack years: 25.00    Types: Cigarettes, Cigars    Start date: 05/25/1988   Smokeless tobacco: Former    Types: Snuff    Quit date: 05/21/1988  Vaping Use   Vaping Use: Never used  Substance and Sexual Activity   Alcohol use: No    Alcohol/week: 0.0 standard drinks of alcohol   Drug use: Yes    Types: Marijuana    Comment: last used today   Sexual activity: Not on file

## 2022-03-08 NOTE — Pre-Procedure Instructions (Signed)
Surgical Instructions    Your procedure is scheduled on Wednesday, March 15, 2022 at 12:30 pm.  Report to [facility] Main Entrance "A" at 10:30 A.M., then check in with the Admitting office.  Call this number if you have problems the morning of surgery:  [phone]   If you have any questions prior to your surgery date call [phone]: Open Monday-Friday 8am-4pm If you experience any cold or flu symptoms such as cough, fever, chills, shortness of breath, etc. between now and your scheduled surgery, please notify us at the above number     Remember:  Do not eat after midnight the night before your surgery  You may drink clear liquids until 9:30 am the morning of your surgery.   Clear liquids allowed are: Water, Non-Citrus Juices (without pulp), Carbonated Beverages, Clear Tea, Black Coffee ONLY (NO MILK, CREAM OR POWDERED CREAMER of any kind), and Gatorade    Take these medicines the morning of surgery with A SIP OF WATER:   carvedilol (COREG)   pantoprazole (PROTONIX)   risperiDONE (RISPERDAL)   ezetimibe (ZETIA)    IF NEEDED:  hydrOXYzine (ATARAX)    As of today, STOP taking any Aspirin (unless otherwise instructed by your surgeon) Aleve, Naproxen, Ibuprofen, Motrin, Advil, Goody's, BC's, all herbal medications, fish oil, and all vitamins.  Antiplatelet and/or Anticoagulation Recommendations: Okay to hold Aspirin 7 days prior to surgery and may resume when it is safe to do so post-op. Recommend reaching out to Vascular Surgery about Plavix recommendations prior to hip surgery. Per Finis Bud, NP          Do not wear jewelry. Do not wear lotions, powders, cologne or deodorant. Do not shave 48 hours prior to surgery.  Men may shave face and neck. Do not bring valuables to the hospital. Do not wear nail polish, gel polish, artificial nails, or any other type of covering on natural nails (fingers and toes)  [facility] is not responsible for any belongings or valuables.     Do NOT Smoke (Tobacco/Vaping)  24 hours prior to your procedure  If you use a CPAP at night, you may bring your mask for your overnight stay.   Contacts, glasses, hearing aids, dentures or partials may not be worn into surgery, please bring cases for these belongings   For patients admitted to the hospital, discharge time will be determined by your treatment team.   Patients discharged the day of surgery will not be allowed to drive home, and someone needs to stay with them for 24 hours.   SURGICAL WAITING ROOM VISITATION Patients having surgery or a procedure may have no more than 2 support people in the waiting area - these visitors may rotate.   Children under the age of 58 must have an adult with them who is not the patient. If the patient needs to stay at the hospital during part of their recovery, the visitor guidelines for inpatient rooms apply. Pre-op nurse will coordinate an appropriate time for 1 support person to accompany patient in pre-op.  This support person may not rotate.   Please refer to [email or web address] for the visitor guidelines for Inpatients (after your surgery is over and you are in a regular room).    Special instructions:    Oral Hygiene is also important to reduce your risk of infection.  Remember - BRUSH YOUR TEETH THE MORNING OF SURGERY WITH YOUR REGULAR TOOTHPASTE   [facility]- Preparing For Surgery  Before surgery, you can play an  important role. Because skin is not sterile, your skin needs to be as free of germs as possible. You can reduce the number of germs on your skin by washing with CHG (chlorahexidine gluconate) Soap before surgery.  CHG is an antiseptic cleaner which kills germs and bonds with the skin to continue killing germs even after washing.     Please do not use if you have an allergy to CHG or antibacterial soaps. If your skin becomes reddened/irritated stop using the CHG.  Do not  shave (including legs and underarms) for at least 48 hours prior to first CHG shower. It is OK to shave your face.  Please follow these instructions carefully.     Shower the NIGHT BEFORE SURGERY and the MORNING OF SURGERY with CHG Soap.   If you chose to wash your hair, wash your hair first as usual with your normal shampoo. After you shampoo, rinse your hair and body thoroughly to remove the shampoo.  Then [employer] and genitals (private parts) with your normal soap and rinse thoroughly to remove soap.  After that Use CHG Soap as you would any other liquid soap. You can apply CHG directly to the skin and wash gently with a scrungie or a clean washcloth.   Apply the CHG Soap to your body ONLY FROM THE NECK DOWN.  Do not use on open wounds or open sores. Avoid contact with your eyes, ears, mouth and genitals (private parts). Wash Face and genitals (private parts)  with your normal soap.   Wash thoroughly, paying special attention to the area where your surgery will be performed.  Thoroughly rinse your body with warm water from the neck down.  DO NOT shower/wash with your normal soap after using and rinsing off the CHG Soap.  Pat yourself dry with a CLEAN TOWEL.  Wear CLEAN PAJAMAS to bed the night before surgery  Place CLEAN SHEETS on your bed the night before your surgery  DO NOT SLEEP WITH PETS.   Day of Surgery:  Take a shower with CHG soap. Wear Clean/Comfortable clothing the morning of surgery Do not apply any deodorants/lotions.   Remember to brush your teeth WITH YOUR REGULAR TOOTHPASTE.    If you received a COVID test during your pre-op visit, it is requested that you wear a mask when out in public, stay away from anyone that may not be feeling well, and notify your surgeon if you develop symptoms. If you have been in contact with anyone that has tested positive in the last 10 days, please notify your surgeon.    Please read over the following fact sheets that you were  given.

## 2022-03-09 ENCOUNTER — Encounter (HOSPITAL_COMMUNITY)
Admission: RE | Admit: 2022-03-09 | Discharge: 2022-03-09 | Disposition: A | Discharge status: Home / Self Care | Payer: Medicaid Other | Source: Ambulatory Visit | Attending: Orthopaedic Surgery | Admitting: Orthopaedic Surgery

## 2022-03-09 ENCOUNTER — Encounter (HOSPITAL_COMMUNITY): Payer: Self-pay

## 2022-03-09 ENCOUNTER — Other Ambulatory Visit: Payer: Self-pay

## 2022-03-09 VITALS — BP 158/92 | HR 90 | Temp 98.3°F | Resp 18 | Ht 67.0 in | Wt 178.0 lb

## 2022-03-09 DIAGNOSIS — I2510 Atherosclerotic heart disease of native coronary artery without angina pectoris: Secondary | ICD-10-CM | POA: Insufficient documentation

## 2022-03-09 DIAGNOSIS — I255 Ischemic cardiomyopathy: Secondary | ICD-10-CM | POA: Diagnosis not present

## 2022-03-09 DIAGNOSIS — Z79899 Other long term (current) drug therapy: Secondary | ICD-10-CM | POA: Diagnosis not present

## 2022-03-09 DIAGNOSIS — Z7982 Long term (current) use of aspirin: Secondary | ICD-10-CM | POA: Diagnosis not present

## 2022-03-09 DIAGNOSIS — K219 Gastro-esophageal reflux disease without esophagitis: Secondary | ICD-10-CM | POA: Diagnosis not present

## 2022-03-09 DIAGNOSIS — I252 Old myocardial infarction: Secondary | ICD-10-CM | POA: Diagnosis not present

## 2022-03-09 DIAGNOSIS — E785 Hyperlipidemia, unspecified: Secondary | ICD-10-CM | POA: Insufficient documentation

## 2022-03-09 DIAGNOSIS — Z7902 Long term (current) use of antithrombotics/antiplatelets: Secondary | ICD-10-CM | POA: Insufficient documentation

## 2022-03-09 DIAGNOSIS — G8929 Other chronic pain: Secondary | ICD-10-CM | POA: Diagnosis not present

## 2022-03-09 DIAGNOSIS — Z955 Presence of coronary angioplasty implant and graft: Secondary | ICD-10-CM | POA: Insufficient documentation

## 2022-03-09 DIAGNOSIS — M1611 Unilateral primary osteoarthritis, right hip: Secondary | ICD-10-CM | POA: Insufficient documentation

## 2022-03-09 DIAGNOSIS — F319 Bipolar disorder, unspecified: Secondary | ICD-10-CM | POA: Diagnosis not present

## 2022-03-09 DIAGNOSIS — Z01812 Encounter for preprocedural laboratory examination: Secondary | ICD-10-CM | POA: Insufficient documentation

## 2022-03-09 DIAGNOSIS — I739 Peripheral vascular disease, unspecified: Secondary | ICD-10-CM | POA: Insufficient documentation

## 2022-03-09 DIAGNOSIS — Z01818 Encounter for other preprocedural examination: Secondary | ICD-10-CM

## 2022-03-09 DIAGNOSIS — I10 Essential (primary) hypertension: Secondary | ICD-10-CM | POA: Diagnosis not present

## 2022-03-09 HISTORY — DX: Peripheral vascular disease, unspecified: I73.9

## 2022-03-09 HISTORY — DX: Gastro-esophageal reflux disease without esophagitis: K21.9

## 2022-03-09 HISTORY — DX: Unspecified osteoarthritis, unspecified site: M19.90

## 2022-03-09 LAB — CBC
HCT: 49 % (ref 39.0–52.0)
Hemoglobin: 17.4 g/dL — ABNORMAL HIGH (ref 13.0–17.0)
MCH: 32.3 pg (ref 26.0–34.0)
MCHC: 35.5 g/dL (ref 30.0–36.0)
MCV: 90.9 fL (ref 80.0–100.0)
Platelets: 259 10*3/uL (ref 150–400)
RBC: 5.39 MIL/uL (ref 4.22–5.81)
RDW: 13.1 % (ref 11.5–15.5)
WBC: 7.5 10*3/uL (ref 4.0–10.5)
nRBC: 0 % (ref 0.0–0.2)

## 2022-03-09 LAB — TYPE AND SCREEN
ABO/RH(D): A POS
Antibody Screen: NEGATIVE

## 2022-03-09 LAB — BASIC METABOLIC PANEL
Anion gap: 6 (ref 5–15)
BUN: 10 mg/dL (ref 6–20)
CO2: 25 mmol/L (ref 22–32)
Calcium: 9 mg/dL (ref 8.9–10.3)
Chloride: 105 mmol/L (ref 98–111)
Creatinine, Ser: 1.14 mg/dL (ref 0.61–1.24)
GFR, Estimated: >60 mL/min (ref >60)
Glucose, Bld: 103 mg/dL — ABNORMAL HIGH (ref 70–99)
Potassium: 4.1 mmol/L (ref 3.5–5.1)
Sodium: 136 mmol/L (ref 135–145)

## 2022-03-09 LAB — SURGICAL PCR SCREEN
MRSA, PCR: NEGATIVE
Staphylococcus aureus: NEGATIVE

## 2022-03-09 NOTE — Progress Notes (Signed)
Abnormal Hgb in PAT. Dr. Lorin Mercy office was called and informed Judeen Hammans) and also, Persons, Bevely Palmer, [place] was notified via secure chart.

## 2022-03-09 NOTE — Progress Notes (Signed)
PCP - [name], MD Cardiologist - Carlyle Dolly, MD    PPM/ICD - denies   Chest x-ray - n/a EKG - 11/30/21 Stress Test - 12/08/21 ECHO - 07/19/17 Cardiac Cath - 12/21/21  Sleep Study - denies CPAP - n/a  Fasting Blood Sugar - n/a  Blood Thinner Instructions: Plavix - last dose - 03/07/22 Aspirin Instructions - last dose - 03/07/22 Patient was instructed: As of today, STOP taking any Aspirin (unless otherwise instructed by your surgeon) Aleve, Naproxen, Ibuprofen, Motrin, Advil, Goody's, BC's, all herbal medications, fish oil, and all vitamins.   ERAS Protcol - yes, until 09:30 o'clock  COVID TEST- n/a   Anesthesia review: yes - cardiac history; cardiac clearance - 03/03/22  Patient denies shortness of breath, fever, cough and chest pain at PAT appointment   All instructions explained to the patient, with a verbal understanding of the material. Patient agrees to go over the instructions while at home for a better understanding. Patient also instructed to self quarantine after being tested for COVID-19. The opportunity to ask questions was provided.

## 2022-03-10 ENCOUNTER — Encounter (HOSPITAL_COMMUNITY): Payer: Self-pay

## 2022-03-10 NOTE — Anesthesia Preprocedure Evaluation (Signed)
Anesthesia Evaluation  Patient identified by MRN, date of birth, ID band Patient awake    Reviewed: Allergy & Precautions, NPO status , Patient's Chart, lab work & pertinent test results, reviewed documented beta blocker date and time   Airway Mallampati: II  TM Distance: >3 FB Neck ROM: Full    Dental  (+) Dental Advisory Given, Edentulous Upper, Edentulous Lower   Pulmonary Current Smoker and Patient abstained from smoking.   Pulmonary exam normal breath sounds clear to auscultation       Cardiovascular hypertension, Pt. on medications and Pt. on home beta blockers + angina  + CAD, + Past MI, + Cardiac Stents and + Peripheral Vascular Disease  Normal cardiovascular exam Rhythm:Regular Rate:Normal  LHC 12/2021   Prox RCA to Mid RCA lesion is 50% stenosed. Mid RCA lesion is 50% stenosed. Mid RCA to Dist RCA lesion is 50% stenosed.   RPAV lesion is 100% stenosed with side branch in 1st RPL. Previously Placed 1st PL stent is 100% stenosed.  The 1st PL fills distally via L-R collaterals   Previously placed Dist RCA stent is 10% stenosed.  Previously placed RPDA stent is 5% stenosed.   Previously placed Prox Cx to Dist Cx stent of unknown type is  widely patent.   Prox LAD to Mid LAD lesion is 35% stenosed.     POST-OPERATIVE DIAGNOSIS:    Angiographically stable coronary arteries.    Widely patent LCx stents and distal RCA-PDA stents and known occlusion of the RPAV-PL system with left-to-right collaterals.    The entire RCA is diffusely diseased, but no focal lesion noted.  Normal left ventriculogram with normal LVEDP.     PLAN OF CARE: Discharge to home after PACU - Cleared for Surgery   No reason for the patient not to proceed with surgery.  Would address existing coronary disease with medical management.  In the absence of symptoms, WOULD NOT recommend PCI.  Okay to hold antiplatelet agent for surgery 7 days  preop   Nuclear stress test 12/08/21:   Findings are consistent with prior myocardial infarction with peri-infarct ischemia. The study is low to intermediate risk.   No ST deviation was noted. The ECG was negative for ischemia.   LV perfusion is abnormal.  Small to intermediate sized, moderate intensity, apical to basal inferior defect with partial reversibility most prominently mid to apical distribution consistent with scar and mild to moderate peri-infarct ischemia in the RCA distribution.  Summed stress score is 11.   Left ventricular function is normal. Nuclear stress EF: 54 %. End diastolic cavity size is normal. End systolic cavity size is normal.   Low to intermediate risk study with evidence of inferior infarct scar and mild to moderate peri-infarct ischemia, LVEF 54%    Echo 2019 - Left ventricle: The cavity size was normal. Wall thickness was normal. The estimated ejection fraction was 55%. There is hypokinesis of the basal-midinferolateral and inferior myocardium. Left ventricular diastolic function parameters were normal for the patient&'s age.  - Aortic valve: Mildly calcified annulus. Trileaflet.  - Mitral valve: There was trivial regurgitation.  - Right atrium: Central venous pressure (est): 3 mm Hg.  - Atrial septum: No defect or patent foramen ovale was identified.  - Tricuspid valve: There was trivial regurgitation.  - Pulmonary arteries: PA peak pressure: 14 mm Hg (S).  - Pericardium, extracardiac: There was no pericardial effusion.     Neuro/Psych  Headaches PSYCHIATRIC DISORDERS   Bipolar Disorder  GI/Hepatic Neg liver ROS, PUD,GERD  ,,  Endo/Other  negative endocrine ROS    Renal/GU negative Renal ROS     Musculoskeletal  (+) Arthritis ,    Abdominal   Peds  Hematology negative hematology ROS (+)   Anesthesia Other Findings   Reproductive/Obstetrics                              Anesthesia Physical Anesthesia  Plan  ASA: 3  Anesthesia Plan: Spinal   Post-op Pain Management: Tylenol PO (pre-op)*, Celebrex PO (pre-op)* and Ketamine IV*   Induction: Intravenous  PONV Risk Score and Plan: 2 and Ondansetron, Propofol infusion, Treatment may vary due to age or medical condition and Midazolam  Airway Management Planned:   Additional Equipment:   Intra-op Plan:   Post-operative Plan:   Informed Consent: I have reviewed the patients History and Physical, chart, labs and discussed the procedure including the risks, benefits and alternatives for the proposed anesthesia with the patient or authorized representative who has indicated his/her understanding and acceptance.     Dental advisory given  Plan Discussed with: CRNA  Anesthesia Plan Comments: (PAT note written by Myra Gianotti, PA-C.  )        Anesthesia Quick Evaluation

## 2022-03-10 NOTE — Progress Notes (Signed)
Anesthesia Chart Review:  Case: [id number] Date/Time: 03/15/22 1215   Procedure: RIGHT TOTAL HIP ARTHROPLASTY ANTERIOR APPROACH (Right: Hip) - Needs RNFA   Anesthesia type: Spinal   Pre-op diagnosis: right hip osteoarthritis   Location: [place] [facility] [place] OR   Surgeons: [name], MD       DISCUSSION: Patient is a 52 year old male scheduled for the above procedure.  History includes smoking, HTN, HLD, CAD (inferior STEMI, s/p BMS PLA 12/16/13, occluded PLA stent with left-to-right collaterals 08/27/14; inferolateral STEMI, s/p DES CX 05/19/16; NSTEMI, s/p overlapping DES dRCA to prox-mid PDA DES 07/02/18; medical therapy 12/21/21 [facility]), ischemic cardiomyopathy, PAD (PTA right SFA, DES right EIA 09/09/21), bipolar 1 disorder, chronic back pain, PUD, GERD, osteoarthritis.  Last visit with vascular surgeon Dr. Donnetta Hutching was on 10/05/21. S/p drug-coated balloon angioplasty right SFA and angioplasty with DES to right EIA 09/09/21. He noted that he was considering bilateral hip replacement surgery. ASA and Plavix for total of one year planned.   Preoperative cardiology evaluation on 03/03/22 by Finis Bud, NP. She wrote: "Pre-op cardiovascular risk assessment Mr. Brame perioperative risk of a major cardiac event is 6.6% according to the Revised Cardiac Risk Index (RCRI).  Therefore, he is at high risk for perioperative complications.   His functional capacity is good at 6.27 METs according to the Duke Activity Status Index (DASI). Recommendations: According to ACC/AHA guidelines, no further cardiovascular testing needed.  The patient may proceed to surgery at acceptable risk.   Antiplatelet and/or Anticoagulation Recommendations: Okay to hold Aspirin 7 days prior to surgery and may resume when it is safe to do so post-op. Pt is currently on Plavix d/t PAD surgery. Dr. Ellyn Hack stated okay to hold antiplatelet 7 days prior to surgery on cath report on 12/2021. Recommend reaching out to Vascular Surgery  about Plavix recommendations prior to hip surgery." I have sent a staff message to Dr. Donnetta Hutching and Dr. Lorin Mercy regarding cardiology recommendations.   He reported Plavix and ASA have been on hold since after 03/07/22 dose.   ADDENDUM 03/14/22 2:28 PM: Dr. Donnetta Hutching communicated that "it is okay to proceed with the THA and to hold aspirin and Plavix as noted."   VS: BP (!) 158/92   Pulse 90   Temp 36.8 C   Resp 18   Ht 5\' 7"  (1.702 m)   Wt 80.7 kg   SpO2 100%   BMI 27.88 kg/m    PROVIDERS: Johnette Abraham, MD is PCP Claudina Lick, MD is cardiologist  Curt Jews, MD is vascular surgeon   LABS: Labs reviewed: Acceptable for surgery. (all labs ordered are listed, but only abnormal results are displayed)  Labs Reviewed  BASIC METABOLIC PANEL - Abnormal; Notable for the following components:      Result Value   Glucose, Bld 103 (*)    All other components within normal limits  CBC - Abnormal; Notable for the following components:   Hemoglobin 17.4 (*)    All other components within normal limits  SURGICAL PCR SCREEN  TYPE AND SCREEN    EKG: 11/30/21:   CV: Cardiac cath 12/21/21:   Prox RCA to Mid RCA lesion is 50% stenosed. Mid RCA lesion is 50% stenosed. Mid RCA to Dist RCA lesion is 50% stenosed.   RPAV lesion is 100% stenosed with side branch in 1st RPL. Previously Placed 1st PL stent is 100% stenosed.  The 1st PL fills distally via L-R collaterals   Previously placed Dist RCA stent is  10% stenosed.  Previously placed RPDA stent is 5% stenosed.   Previously placed Prox Cx to Dist Cx stent of unknown type is  widely patent.   Prox LAD to Mid LAD lesion is 35% stenosed.   POST-OPERATIVE DIAGNOSIS:   Angiographically stable coronary arteries.  Widely patent LCx stents and distal RCA-PDA stents and known occlusion of the RPAV-PL system with left-to-right collaterals.   The entire RCA is diffusely diseased, but no focal lesion noted. Normal left ventriculogram with normal  LVEDP.    PLAN OF CARE:  Discharge to home after PACU - Cleared for Surgery  No reason for the patient not to proceed with surgery.  Would address existing coronary disease with medical management.  In the absence of symptoms, WOULD NOT recommend PCI. Okay to hold antiplatelet agent for surgery 7 days preop   Nuclear stress test 12/08/21:   Findings are consistent with prior myocardial infarction with peri-infarct ischemia. The study is low to intermediate risk.   No ST deviation was noted. The ECG was negative for ischemia.   LV perfusion is abnormal.  Small to intermediate sized, moderate intensity, apical to basal inferior defect with partial reversibility most prominently mid to apical distribution consistent with scar and mild to moderate peri-infarct ischemia in the RCA distribution.  Summed stress score is 11.   Left ventricular function is normal. Nuclear stress EF: 54 %. End diastolic cavity size is normal. End systolic cavity size is normal.   Low to intermediate risk study with evidence of inferior infarct scar and mild to moderate peri-infarct ischemia, LVEF 54%.   Echo 07/19/17: Study Conclusions  - Left ventricle: The cavity size was normal. Wall thickness was    normal. The estimated ejection fraction was 55%. There is    hypokinesis of the basal-midinferolateral and inferior    myocardium. Left ventricular diastolic function parameters were    normal for the patient&'s age.  - Aortic valve: Mildly calcified annulus. Trileaflet.  - Mitral valve: There was trivial regurgitation.  - Right atrium: Central venous pressure (est): 3 mm Hg.  - Atrial septum: No defect or patent foramen ovale was identified.  - Tricuspid valve: There was trivial regurgitation.  - Pulmonary arteries: PA peak pressure: 14 mm Hg (S).  - Pericardium, extracardiac: There was no pericardial effusion.    Past Medical History:  Diagnosis Date   Arthritis    Bipolar 1 disorder ([place])    CAD (coronary  artery disease)    a. 12/16/13 inferolat STEMI s/p BMS to RPL. b. Cath 08/2014: interim occlusion of the PLA stent of the RCA, collateralized from the left coronary artery with diffuse nonobstructive CAD;  c. STEMI/PCI: LM nl, LAD nl, LCX 158m/d (2.75x12 synergy DES), RCA 30p, RPDA 75ost/p, RPAV 100 w/ L->R collats.   Chronic back pain    GERD (gastroesophageal reflux disease)    HLD (hyperlipidemia)    Hypertension    Ischemic cardiomyopathy    a. 12/2013: EF 50-55% by echo with +WMA. b. EF 45% by cath in 08/2014.   PAD (peripheral artery disease) ([facility])    s/p drug-coated balloon angioplasty right SFA, angioplasty/DES right EIA 09/09/21   Peptic ulcer disease    Schizophrenic disorder ([place])    Tobacco abuse     Past Surgical History:  Procedure Laterality Date   ABDOMINAL AORTOGRAM W/LOWER EXTREMITY Bilateral 09/09/2021   Procedure: ABDOMINAL AORTOGRAM W/LOWER EXTREMITY;  Surgeon: Angelia Mould, MD;  Location: [facility];  Service: Cardiovascular;  Laterality:  Bilateral;   CARDIAC CATHETERIZATION N/A 08/07/2014   Procedure: Left Heart Cath and Coronary Angiography;  Surgeon: Sherren Mocha, MD;  Location: [place] [facility];  Service: Cardiovascular;  Laterality: N/A;   COLONOSCOPY N/A 06/06/2017   Procedure: COLONOSCOPY;  Surgeon: Rogene Houston, MD;  [facility]: [facility];  Service: Endoscopy;  Laterality: N/A;  10:50   CORONARY STENT INTERVENTION N/A 05/19/2016   Procedure: Coronary Stent Intervention;  Surgeon: Lorretta Harp, MD;  Location: [facility] [facility];  Service: Cardiovascular;  Laterality: N/A;   CORONARY STENT INTERVENTION N/A 07/02/2018   Procedure: CORONARY STENT INTERVENTION;  Surgeon: Belva Crome, MD;  Location: [facility];  Service: Cardiovascular;  Laterality: N/A;   HERNIA REPAIR     LEFT HEART CATH AND CORONARY ANGIOGRAPHY N/A 05/19/2016   Procedure: Left Heart Cath and Coronary Angiography;  Surgeon: Lorretta Harp, MD;  Location:  [place] [facility];  Service: Cardiovascular;  Laterality: N/A;   LEFT HEART CATH AND CORONARY ANGIOGRAPHY N/A 07/02/2018   Procedure: LEFT HEART CATH AND CORONARY ANGIOGRAPHY;  Surgeon: Belva Crome, MD;  Location: [place] [facility];  Service: Cardiovascular;  Laterality: N/A;   LEFT HEART CATH AND CORONARY ANGIOGRAPHY N/A 12/21/2021   Procedure: LEFT HEART CATH AND CORONARY ANGIOGRAPHY;  Surgeon: Leonie Man, MD;  Location: [place] [facility];  Service: Cardiovascular;  Laterality: N/A;   LEFT HEART CATHETERIZATION WITH CORONARY ANGIOGRAM N/A 12/16/2013   Procedure: LEFT HEART CATHETERIZATION WITH CORONARY ANGIOGRAM;  Surgeon: Jettie Booze, MD;  Location: [facility] CATH LAB;  Service: Cardiovascular;  Laterality: N/A;   PERCUTANEOUS CORONARY STENT INTERVENTION (PCI-S)  12/16/2013   Procedure: PERCUTANEOUS CORONARY STENT INTERVENTION (PCI-S);  Surgeon: Jettie Booze, MD;  Location: [facility] CATH LAB;  Service: Cardiovascular;;  distal rca   PERIPHERAL VASCULAR BALLOON ANGIOPLASTY  09/09/2021   Procedure: PERIPHERAL VASCULAR BALLOON ANGIOPLASTY;  Surgeon: Angelia Mould, MD;  Location: [place] [facility];  Service: Cardiovascular;;  Rt SFA   PERIPHERAL VASCULAR INTERVENTION  09/09/2021   Procedure: PERIPHERAL VASCULAR INTERVENTION;  Surgeon: Angelia Mould, MD;  Location: [place] [facility];  Service: Cardiovascular;;  Rt Iliac   TONSILLECTOMY      MEDICATIONS:  aspirin EC 81 MG tablet   benazepril (LOTENSIN) 40 MG tablet   carvedilol (COREG) 6.25 MG tablet   clopidogrel (PLAVIX) 75 MG tablet   ezetimibe (ZETIA) 10 MG tablet   hydrOXYzine (ATARAX) 25 MG tablet   mirtazapine (REMERON) 30 MG tablet   pantoprazole (PROTONIX) 20 MG tablet   risperiDONE (RISPERDAL) 1 MG tablet   No current facility-administered medications for this encounter.    Myra Gianotti, PA-C Surgical Short Stay/Anesthesiology [facility] Phone [phone] [facility] Phone [phone] 03/10/2022 5:25  PM

## 2022-03-13 ENCOUNTER — Telehealth (HOSPITAL_COMMUNITY): Payer: Self-pay | Admitting: Vascular Surgery

## 2022-03-13 NOTE — Progress Notes (Addendum)
Created in error

## 2022-03-15 ENCOUNTER — Other Ambulatory Visit: Payer: Self-pay

## 2022-03-15 ENCOUNTER — Encounter (HOSPITAL_COMMUNITY)
Admission: RE | Disposition: A | Discharge status: Home / Self Care | Payer: Self-pay | Source: Home / Self Care | Attending: Orthopaedic Surgery

## 2022-03-15 ENCOUNTER — Observation Stay (HOSPITAL_COMMUNITY)
Admission: RE | Admit: 2022-03-15 | Discharge: 2022-03-16 | Disposition: A | Discharge status: Home / Self Care | Payer: Medicaid Other | Attending: Orthopaedic Surgery | Admitting: Orthopaedic Surgery

## 2022-03-15 ENCOUNTER — Ambulatory Visit: Payer: Medicaid Other | Admitting: Internal Medicine

## 2022-03-15 ENCOUNTER — Ambulatory Visit (HOSPITAL_COMMUNITY): Payer: Medicaid Other

## 2022-03-15 ENCOUNTER — Ambulatory Visit (HOSPITAL_BASED_OUTPATIENT_CLINIC_OR_DEPARTMENT_OTHER): Payer: Medicaid Other | Admitting: Certified Registered"

## 2022-03-15 ENCOUNTER — Encounter (HOSPITAL_COMMUNITY): Payer: Self-pay | Admitting: Orthopaedic Surgery

## 2022-03-15 ENCOUNTER — Ambulatory Visit (HOSPITAL_COMMUNITY): Payer: Medicaid Other | Admitting: Vascular Surgery

## 2022-03-15 DIAGNOSIS — F17210 Nicotine dependence, cigarettes, uncomplicated: Secondary | ICD-10-CM | POA: Diagnosis not present

## 2022-03-15 DIAGNOSIS — M1611 Unilateral primary osteoarthritis, right hip: Principal | ICD-10-CM | POA: Insufficient documentation

## 2022-03-15 DIAGNOSIS — Z7902 Long term (current) use of antithrombotics/antiplatelets: Secondary | ICD-10-CM | POA: Insufficient documentation

## 2022-03-15 DIAGNOSIS — Z96641 Presence of right artificial hip joint: Secondary | ICD-10-CM

## 2022-03-15 DIAGNOSIS — I2510 Atherosclerotic heart disease of native coronary artery without angina pectoris: Secondary | ICD-10-CM | POA: Diagnosis not present

## 2022-03-15 DIAGNOSIS — Z955 Presence of coronary angioplasty implant and graft: Secondary | ICD-10-CM | POA: Insufficient documentation

## 2022-03-15 DIAGNOSIS — I25119 Atherosclerotic heart disease of native coronary artery with unspecified angina pectoris: Secondary | ICD-10-CM

## 2022-03-15 DIAGNOSIS — Z7982 Long term (current) use of aspirin: Secondary | ICD-10-CM | POA: Insufficient documentation

## 2022-03-15 DIAGNOSIS — I252 Old myocardial infarction: Secondary | ICD-10-CM

## 2022-03-15 DIAGNOSIS — Z79899 Other long term (current) drug therapy: Secondary | ICD-10-CM | POA: Insufficient documentation

## 2022-03-15 DIAGNOSIS — Z01818 Encounter for other preprocedural examination: Secondary | ICD-10-CM

## 2022-03-15 DIAGNOSIS — I10 Essential (primary) hypertension: Secondary | ICD-10-CM | POA: Insufficient documentation

## 2022-03-15 DIAGNOSIS — F17200 Nicotine dependence, unspecified, uncomplicated: Secondary | ICD-10-CM

## 2022-03-15 HISTORY — PX: TOTAL HIP ARTHROPLASTY: SHX124

## 2022-03-15 LAB — ABO/RH: ABO/RH(D): A POS

## 2022-03-15 SURGERY — ARTHROPLASTY, HIP, TOTAL, ANTERIOR APPROACH
Anesthesia: Spinal | Site: Hip | Laterality: Right

## 2022-03-15 MED ORDER — CLOPIDOGREL BISULFATE 75 MG PO TABS
75.0000 mg | ORAL_TABLET | Freq: Every day | ORAL | Status: DC
  Administered 2022-03-16: 75 mg via ORAL
  Filled 2022-03-15: qty 1

## 2022-03-15 MED ORDER — OXYCODONE HCL 5 MG PO TABS
10.0000 mg | ORAL_TABLET | ORAL | Status: DC | PRN
  Administered 2022-03-15 (×2): 10 mg via ORAL
  Administered 2022-03-16: 15 mg via ORAL
  Filled 2022-03-15 (×3): qty 2
  Filled 2022-03-15: qty 3

## 2022-03-15 MED ORDER — METHOCARBAMOL 500 MG PO TABS
500.0000 mg | ORAL_TABLET | Freq: Four times a day (QID) | ORAL | Status: DC | PRN
  Administered 2022-03-15 – 2022-03-16 (×2): 500 mg via ORAL
  Filled 2022-03-15 (×2): qty 1

## 2022-03-15 MED ORDER — ACETAMINOPHEN 325 MG PO TABS: 325.0000 mg | ORAL_TABLET | Freq: Four times a day (QID) | ORAL | Status: DC | PRN

## 2022-03-15 MED ORDER — MIRTAZAPINE 15 MG PO TABS
30.0000 mg | ORAL_TABLET | Freq: Every day | ORAL | Status: DC
  Administered 2022-03-15: 30 mg via ORAL
  Filled 2022-03-15: qty 2

## 2022-03-15 MED ORDER — CEFAZOLIN SODIUM-DEXTROSE 2-4 GM/100ML-% IV SOLN
2.0000 g | INTRAVENOUS | Status: AC
  Administered 2022-03-15: 2 g via INTRAVENOUS
  Filled 2022-03-15: qty 100

## 2022-03-15 MED ORDER — OXYCODONE HCL 5 MG PO TABS
5.0000 mg | ORAL_TABLET | ORAL | Status: DC | PRN
  Administered 2022-03-16 (×2): 10 mg via ORAL
  Filled 2022-03-15: qty 2

## 2022-03-15 MED ORDER — POLYETHYLENE GLYCOL 3350 17 G PO PACK: 17.0000 g | PACK | Freq: Every day | ORAL | Status: DC | PRN

## 2022-03-15 MED ORDER — KETAMINE HCL 10 MG/ML IJ SOLN
INTRAMUSCULAR | Status: DC | PRN
  Administered 2022-03-15: 20 mg via INTRAVENOUS

## 2022-03-15 MED ORDER — BUPIVACAINE IN DEXTROSE 0.75-8.25 % IT SOLN
INTRATHECAL | Status: DC | PRN
  Administered 2022-03-15: 1.8 mL via INTRATHECAL

## 2022-03-15 MED ORDER — PANTOPRAZOLE SODIUM 20 MG PO TBEC
20.0000 mg | DELAYED_RELEASE_TABLET | Freq: Every day | ORAL | Status: DC
  Administered 2022-03-15 – 2022-03-16 (×2): 20 mg via ORAL
  Filled 2022-03-15 (×2): qty 1

## 2022-03-15 MED ORDER — BUPIVACAINE HCL (PF) 0.5 % IJ SOLN
INTRAMUSCULAR | Status: AC
  Filled 2022-03-15: qty 30

## 2022-03-15 MED ORDER — KETAMINE HCL 50 MG/5ML IJ SOSY
PREFILLED_SYRINGE | INTRAMUSCULAR | Status: AC
  Filled 2022-03-15: qty 5

## 2022-03-15 MED ORDER — PHENYLEPHRINE HCL-NACL 20-0.9 MG/250ML-% IV SOLN
INTRAVENOUS | Status: DC | PRN
  Administered 2022-03-15: 40 ug/min via INTRAVENOUS

## 2022-03-15 MED ORDER — CHLORHEXIDINE GLUCONATE 4 % EX LIQD: 60.0000 mL | Freq: Once | CUTANEOUS | Status: DC

## 2022-03-15 MED ORDER — EPHEDRINE SULFATE-NACL 50-0.9 MG/10ML-% IV SOSY
PREFILLED_SYRINGE | INTRAVENOUS | Status: DC | PRN
  Administered 2022-03-15: 10 mg via INTRAVENOUS
  Administered 2022-03-15 (×3): 5 mg via INTRAVENOUS

## 2022-03-15 MED ORDER — POVIDONE-IODINE 10 % EX SWAB
2.0000 | Freq: Once | CUTANEOUS | Status: AC
  Administered 2022-03-15: 2 via TOPICAL

## 2022-03-15 MED ORDER — BUPIVACAINE LIPOSOME 1.3 % IJ SUSP
INTRAMUSCULAR | Status: DC | PRN
  Administered 2022-03-15: 10 mL via SURGICAL_CAVITY

## 2022-03-15 MED ORDER — CARVEDILOL 6.25 MG PO TABS
6.2500 mg | ORAL_TABLET | Freq: Two times a day (BID) | ORAL | Status: DC
  Administered 2022-03-15 – 2022-03-16 (×2): 6.25 mg via ORAL
  Filled 2022-03-15 (×2): qty 1

## 2022-03-15 MED ORDER — PROPOFOL 500 MG/50ML IV EMUL
INTRAVENOUS | Status: DC | PRN
  Administered 2022-03-15: 125 ug/kg/min via INTRAVENOUS

## 2022-03-15 MED ORDER — DOCUSATE SODIUM 100 MG PO CAPS
100.0000 mg | ORAL_CAPSULE | Freq: Two times a day (BID) | ORAL | Status: DC
  Administered 2022-03-15 – 2022-03-16 (×2): 100 mg via ORAL
  Filled 2022-03-15 (×2): qty 1

## 2022-03-15 MED ORDER — ASPIRIN 325 MG PO TBEC
325.0000 mg | DELAYED_RELEASE_TABLET | Freq: Every day | ORAL | Status: DC
  Administered 2022-03-16: 325 mg via ORAL
  Filled 2022-03-15: qty 1

## 2022-03-15 MED ORDER — FENTANYL CITRATE (PF) 250 MCG/5ML IJ SOLN
INTRAMUSCULAR | Status: AC
  Filled 2022-03-15: qty 5

## 2022-03-15 MED ORDER — BISACODYL 10 MG RE SUPP: 10.0000 mg | Freq: Every day | RECTAL | Status: DC | PRN

## 2022-03-15 MED ORDER — FERROUS SULFATE 325 (65 FE) MG PO TABS
325.0000 mg | ORAL_TABLET | Freq: Three times a day (TID) | ORAL | Status: DC
  Administered 2022-03-15 – 2022-03-16 (×3): 325 mg via ORAL
  Filled 2022-03-15 (×3): qty 1

## 2022-03-15 MED ORDER — PROPOFOL 1000 MG/100ML IV EMUL
INTRAVENOUS | Status: AC
  Filled 2022-03-15: qty 300

## 2022-03-15 MED ORDER — 0.9 % SODIUM CHLORIDE (POUR BTL) OPTIME
TOPICAL | Status: DC | PRN
  Administered 2022-03-15: 1000 mL

## 2022-03-15 MED ORDER — MIDAZOLAM HCL 2 MG/2ML IJ SOLN
INTRAMUSCULAR | Status: DC | PRN
  Administered 2022-03-15: 2 mg via INTRAVENOUS

## 2022-03-15 MED ORDER — CELECOXIB 200 MG PO CAPS
200.0000 mg | ORAL_CAPSULE | Freq: Two times a day (BID) | ORAL | Status: DC
  Administered 2022-03-15 – 2022-03-16 (×2): 200 mg via ORAL
  Filled 2022-03-15 (×2): qty 1

## 2022-03-15 MED ORDER — TRANEXAMIC ACID-NACL 1000-0.7 MG/100ML-% IV SOLN
1000.0000 mg | INTRAVENOUS | Status: DC
  Filled 2022-03-15: qty 100

## 2022-03-15 MED ORDER — CHLORHEXIDINE GLUCONATE 0.12 % MT SOLN
15.0000 mL | Freq: Once | OROMUCOSAL | Status: AC
  Administered 2022-03-15: 15 mL via OROMUCOSAL
  Filled 2022-03-15: qty 15

## 2022-03-15 MED ORDER — HYDROMORPHONE HCL 1 MG/ML IJ SOLN: 0.5000 mg | INTRAMUSCULAR | Status: DC | PRN

## 2022-03-15 MED ORDER — SODIUM CHLORIDE 0.45 % IV SOLN: INTRAVENOUS | Status: DC

## 2022-03-15 MED ORDER — MENTHOL 3 MG MT LOZG: 1.0000 | LOZENGE | OROMUCOSAL | Status: DC | PRN

## 2022-03-15 MED ORDER — EZETIMIBE 10 MG PO TABS
10.0000 mg | ORAL_TABLET | Freq: Every day | ORAL | Status: DC
  Administered 2022-03-16: 10 mg via ORAL
  Filled 2022-03-15: qty 1

## 2022-03-15 MED ORDER — ONDANSETRON HCL 4 MG PO TABS: 4.0000 mg | ORAL_TABLET | Freq: Four times a day (QID) | ORAL | Status: DC | PRN

## 2022-03-15 MED ORDER — PHENOL 1.4 % MT LIQD: 1.0000 | OROMUCOSAL | Status: DC | PRN

## 2022-03-15 MED ORDER — PHENYLEPHRINE 80 MCG/ML (10ML) SYRINGE FOR IV PUSH (FOR BLOOD PRESSURE SUPPORT)
PREFILLED_SYRINGE | INTRAVENOUS | Status: DC | PRN
  Administered 2022-03-15 (×3): 160 ug via INTRAVENOUS

## 2022-03-15 MED ORDER — BENAZEPRIL HCL 20 MG PO TABS
40.0000 mg | ORAL_TABLET | Freq: Every day | ORAL | Status: DC
  Administered 2022-03-16: 40 mg via ORAL
  Filled 2022-03-15: qty 2

## 2022-03-15 MED ORDER — ORAL CARE MOUTH RINSE: 15.0000 mL | Freq: Once | OROMUCOSAL | Status: AC

## 2022-03-15 MED ORDER — BUPIVACAINE LIPOSOME 1.3 % IJ SUSP
INTRAMUSCULAR | Status: AC
  Filled 2022-03-15: qty 20

## 2022-03-15 MED ORDER — RISPERIDONE 1 MG PO TABS
1.0000 mg | ORAL_TABLET | Freq: Two times a day (BID) | ORAL | Status: DC
  Administered 2022-03-15 – 2022-03-16 (×2): 1 mg via ORAL
  Filled 2022-03-15 (×3): qty 1

## 2022-03-15 MED ORDER — ONDANSETRON HCL 4 MG/2ML IJ SOLN
4.0000 mg | Freq: Four times a day (QID) | INTRAMUSCULAR | Status: DC | PRN
  Administered 2022-03-15: 4 mg via INTRAVENOUS
  Filled 2022-03-15: qty 2

## 2022-03-15 MED ORDER — ASPIRIN 81 MG PO TBEC: 81.0000 mg | DELAYED_RELEASE_TABLET | Freq: Every day | ORAL | Status: DC

## 2022-03-15 MED ORDER — METOCLOPRAMIDE HCL 5 MG PO TABS: 5.0000 mg | ORAL_TABLET | Freq: Three times a day (TID) | ORAL | Status: DC | PRN

## 2022-03-15 MED ORDER — METHOCARBAMOL 1000 MG/10ML IJ SOLN: 500.0000 mg | Freq: Four times a day (QID) | INTRAVENOUS | Status: DC | PRN

## 2022-03-15 MED ORDER — FENTANYL CITRATE (PF) 250 MCG/5ML IJ SOLN
INTRAMUSCULAR | Status: DC | PRN
  Administered 2022-03-15: 50 ug via INTRAVENOUS

## 2022-03-15 MED ORDER — MIDAZOLAM HCL 2 MG/2ML IJ SOLN
INTRAMUSCULAR | Status: AC
  Filled 2022-03-15: qty 2

## 2022-03-15 MED ORDER — METOCLOPRAMIDE HCL 5 MG/ML IJ SOLN: 5.0000 mg | Freq: Three times a day (TID) | INTRAMUSCULAR | Status: DC | PRN

## 2022-03-15 MED ORDER — HYDROXYZINE HCL 25 MG PO TABS: 25.0000 mg | ORAL_TABLET | Freq: Three times a day (TID) | ORAL | Status: DC | PRN

## 2022-03-15 MED ORDER — LACTATED RINGERS IV SOLN: INTRAVENOUS | Status: DC

## 2022-03-15 SURGICAL SUPPLY — 57 items
BAG COUNTER SPONGE SURGICOUNT (BAG) ×1 IMPLANT
BENZOIN TINCTURE PRP APPL 2/3 (GAUZE/BANDAGES/DRESSINGS) ×1 IMPLANT
BLADE CLIPPER SURG (BLADE) IMPLANT
BLADE SAW SGTL 18X1.27X75 (BLADE) ×1 IMPLANT
COVER PERINEAL POST (MISCELLANEOUS) ×1 IMPLANT
COVER SURGICAL LIGHT HANDLE (MISCELLANEOUS) ×1 IMPLANT
DRAPE C-ARM 42X72 X-RAY (DRAPES) ×1 IMPLANT
DRAPE IMP U-DRAPE 54X76 (DRAPES) ×1 IMPLANT
DRAPE STERI IOBAN 125X83 (DRAPES) ×1 IMPLANT
DRAPE U-SHAPE 47X51 STRL (DRAPES) ×3 IMPLANT
DRSG AQUACEL ADVANTAGE 4X5 (GAUZE/BANDAGES/DRESSINGS) IMPLANT
DRSG AQUACEL AG ADV 3.5X10 (GAUZE/BANDAGES/DRESSINGS) IMPLANT
DRSG MEPILEX BORDER 4X8 (GAUZE/BANDAGES/DRESSINGS) ×1 IMPLANT
DURAPREP 26ML APPLICATOR (WOUND CARE) ×1 IMPLANT
ELECT BLADE 4.0 EZ CLEAN MEGAD (MISCELLANEOUS)
ELECT CAUTERY BLADE 6.4 (BLADE) ×1 IMPLANT
ELECT REM PT RETURN 9FT ADLT (ELECTROSURGICAL) ×1
ELECTRODE BLDE 4.0 EZ CLN MEGD (MISCELLANEOUS) IMPLANT
ELECTRODE REM PT RTRN 9FT ADLT (ELECTROSURGICAL) ×1 IMPLANT
ELIMINATOR HOLE APEX DEPUY (Hips) IMPLANT
FACESHIELD WRAPAROUND (MASK) ×2 IMPLANT
FACESHIELD WRAPAROUND OR TEAM (MASK) ×2 IMPLANT
GLOVE BIOGEL PI IND STRL 8 (GLOVE) ×2 IMPLANT
GLOVE ORTHO TXT STRL SZ7.5 (GLOVE) ×2 IMPLANT
GOWN STRL REUS W/ TWL LRG LVL3 (GOWN DISPOSABLE) ×1 IMPLANT
GOWN STRL REUS W/ TWL XL LVL3 (GOWN DISPOSABLE) ×1 IMPLANT
GOWN STRL REUS W/TWL 2XL LVL3 (GOWN DISPOSABLE) ×1 IMPLANT
GOWN STRL REUS W/TWL LRG LVL3 (GOWN DISPOSABLE) ×1
GOWN STRL REUS W/TWL XL LVL3 (GOWN DISPOSABLE) ×1
HEAD CERAMIC DELTA 36 PLUS 1.5 (Hips) IMPLANT
KIT BASIN OR (CUSTOM PROCEDURE TRAY) ×1 IMPLANT
KIT TURNOVER KIT B (KITS) ×1 IMPLANT
LINER NEUTRAL 52X36MM PLUS 4 (Liner) IMPLANT
MANIFOLD NEPTUNE II (INSTRUMENTS) ×1 IMPLANT
NDL HYPO 21X1 ECLIPSE (NEEDLE) ×1 IMPLANT
NEEDLE HYPO 21X1 ECLIPSE (NEEDLE) ×1 IMPLANT
NS IRRIG 1000ML POUR BTL (IV SOLUTION) ×1 IMPLANT
PACK TOTAL JOINT (CUSTOM PROCEDURE TRAY) ×1 IMPLANT
PAD ARMBOARD 7.5X6 YLW CONV (MISCELLANEOUS) ×2 IMPLANT
PIN SECTOR W/GRIP ACE CUP 52MM (Hips) IMPLANT
PULSAVAC PLUS IRRIG FAN TIP (DISPOSABLE)
STEM FEM ACTIS STD SZ4 (Stem) IMPLANT
STRIP CLOSURE SKIN 1/2X4 (GAUZE/BANDAGES/DRESSINGS) ×1 IMPLANT
SUCTION FRAZIER HANDLE 10FR (MISCELLANEOUS) ×1
SUCTION TUBE FRAZIER 10FR DISP (MISCELLANEOUS) IMPLANT
SUT VIC AB 0 CT1 27 (SUTURE) ×1
SUT VIC AB 0 CT1 27XBRD ANBCTR (SUTURE) ×1 IMPLANT
SUT VIC AB 2-0 CT1 27 (SUTURE) ×1
SUT VIC AB 2-0 CT1 TAPERPNT 27 (SUTURE) ×1 IMPLANT
SUT VICRYL 4-0 PS2 18IN ABS (SUTURE) ×1 IMPLANT
SUT VLOC 180 0 24IN GS25 (SUTURE) ×1 IMPLANT
SYR 20CC LL (SYRINGE) ×1 IMPLANT
TIP FAN IRRIG PULSAVAC PLUS (DISPOSABLE) IMPLANT
TOWEL GREEN STERILE (TOWEL DISPOSABLE) ×2 IMPLANT
TOWEL GREEN STERILE FF (TOWEL DISPOSABLE) ×1 IMPLANT
TRAY FOLEY MTR SLVR 16FR STAT (SET/KITS/TRAYS/PACK) ×1 IMPLANT
WATER STERILE IRR 1000ML POUR (IV SOLUTION) ×2 IMPLANT

## 2022-03-15 NOTE — Interval H&P Note (Signed)
History and Physical Interval Note:  03/15/2022 12:08 PM  Don Fletcher  has presented today for surgery, with the diagnosis of right hip osteoarthritis.  The various methods of treatment have been discussed with the patient and family. After consideration of risks, benefits and other options for treatment, the patient has consented to  Procedure(s) with comments: RIGHT TOTAL HIP ARTHROPLASTY ANTERIOR APPROACH (Right) - Needs RNFA as a surgical intervention.  The patient's history has been reviewed, patient examined, no change in status, stable for surgery.  I have reviewed the patient's chart and labs.  Questions were answered to the patient's satisfaction.     [name]

## 2022-03-15 NOTE — Anesthesia Procedure Notes (Addendum)
Spinal  Patient location during procedure: OR Start time: 03/15/2022 12:33 PM End time: 03/15/2022 12:41 PM Reason for block: surgical anesthesia Staffing Performed: anesthesiologist  Anesthesiologist: Nolon Nations, MD Performed by: Nolon Nations, MD Authorized by: Nolon Nations, MD   Preanesthetic Checklist Completed: patient identified, IV checked, site marked, risks and benefits discussed, surgical consent, monitors and equipment checked, pre-op evaluation and timeout performed Spinal Block Patient position: sitting Prep: DuraPrep and site prepped and draped Patient monitoring: heart rate, continuous pulse ox and blood pressure Approach: right paramedian Location: L2-3 Injection technique: single-shot Needle Needle type: Spinocan  Needle gauge: 25 G Needle length: 9 cm Additional Notes Expiration date of kit checked and confirmed. Patient tolerated procedure well, without complications.

## 2022-03-15 NOTE — Anesthesia Postprocedure Evaluation (Signed)
Anesthesia Post Note  Patient: Don Fletcher  Procedure(s) Performed: RIGHT TOTAL HIP ARTHROPLASTY ANTERIOR APPROACH (Right: Hip)     Patient location during evaluation: PACU Anesthesia Type: Spinal Level of consciousness: awake and alert Pain management: pain level controlled Vital Signs Assessment: post-procedure vital signs reviewed and stable Respiratory status: spontaneous breathing Cardiovascular status: stable Anesthetic complications: no   No notable events documented.  Last Vitals:  Vitals:   03/15/22 1530 03/15/22 1548  BP: 114/81 117/69  Pulse:  72  Resp: 13   Temp: 36.5 C (!) 36.4 C  SpO2:  100%    Last Pain:  Vitals:   03/15/22 1548  TempSrc: Axillary  PainSc:                  Nolon Nations

## 2022-03-15 NOTE — Progress Notes (Signed)
Pt unable to remember name of "little blue pill" he took today, only it was for his blood pressure. Reports he took all of his regular BP meds yesterday evening. Dr. Lissa Hoard made aware that pt is unsure if he took beta blocker today. Will manage in OR, no additional meds ordered at this time.

## 2022-03-15 NOTE — Op Note (Signed)
Pre and postop diagnosis: Right hip primary osteoarthritis  Procedure: Right total hip arthroplasty direct anterior approach  Surgeon: Lorin Mercy MD  Assistant: Wandra Arthurs, RNFA  Anesthesia: Spinal plus Exparel and Marcaine 10+10.  Implants:Implants  Brock [employer] APEX DEPUY - [id number]  Inventory Item: [facility] APEX [place] Serial no.: Model/Cat no.: [id number]  Implant name: Brock Bad APEX DEPUY - [id number] Laterality: Right Area: Hip  Manufacturer: [place] Date of Manufacture:   Action: Implanted Number Used: 1   Device Identifier: Device Identifier Type:   PIN SECTOR W/GRIP ACE CUP 52MM - [id number]  Inventory Item: PIN SECTOR W/GRIP ACE CUP 52MM Serial no.: Model/Cat no.: [id number]  Implant name: PIN SECTOR W/GRIP ACE CUP 52MM - [id number] Laterality: Right Area: Hip  Manufacturer: [place] Date of Manufacture:   Action: Implanted Number Used: 1   Device Identifier: Device Identifier Type:   LINER NEUTRAL 52X36MM PLUS 4 - [id number]  Inventory Item: LINER NEUTRAL 52X36MM PLUS 4 Serial no.: Model/Cat no.: [id number]  Implant name: LINER NEUTRAL 52X36MM PLUS 4 - [id number] Laterality: Right Area: Hip  Manufacturer: [place] Date of Manufacture:   Action: Implanted Number Used: 1   Device Identifier: Device Identifier Type:   HEAD CERAMIC DELTA 36 PLUS 1.5 - [id number]  Inventory Item: HEAD CERAMIC DELTA 36 PLUS 1.5 Serial no.: Model/Cat no.: [id number]  Implant name: HEAD CERAMIC DELTA 36 PLUS 1.5 - [id number] Laterality: Right Area: Hip  Manufacturer: [place] Date of Manufacture:   Action: Implanted Number Used: 1   Device Identifier: Device Identifier Type:   STEM FEM ACTIS STD SZ4 - [id number]  Inventory Item: STEM FEM ACTIS STD SZ4 Serial no.: Model/Cat no.: [id number]  Implant name: STEM FEM ACTIS STD SZ4 - [id number] Laterality: Right Area: Hip  Manufacturer: [place] Date of Manufacture:   Action:  Implanted Number Used: 1   Device Identifier: Device Identifier Type:   Procedure: After standard prepping and draping with preoperative Foley catheter placed after spinal anesthesia Hana boots were applied patient placed on the Hana table careful positioning 1015 drapes were applied DuraPrep BLU-U sticky U-Drape's followed by a large shower curtain Betadine Steri-Drape after sterile skin marker and sheet across sheet above.  Timeout procedure was completed.  C-arm was brought in after being sterilely draped and could visualize both hips easily.  The hydraulic arm was attached to base it was sealed and incision was made starting 2 fingerbreadths lateral 1 inferior to ASIS obliquely to the trochanter fascia was nicked extended in line with skin incision elevated medially and a dull cobra placed over the top of the capsule.  Hip retractor lateral and some fat was divided using the Bovie electrocautery no transverse artery was noted with accompanying vein it was a little bit low below the area and femoral neck in her truck region was visualized.  Femur was cut oscillating saw under C arm confirmation for the neck cut completed laterally with the osteotome head was removed with a corkscrew with some difficulty.  Once the head was out sequential reaming medialization and progressing up to 51 for 52 cup.  52 cup fit well was difficult to completely seat finally was completely seated good Abduction and Flexion.  Apex illuminator inserted +4 neutral liner.  Hydraulic arm placed hip externally rotated 110 taken down and under posterior capsule was divided preparation of the femur with cookie cutter rondure large curette large trochanteric Hohmann behind the trochanter and Mueller retractor medial.  Sequentially progressed up to a size 2 checked  under fluoroscopy we then moved more lateral use cookie-cutter again curette and rongeurs gotten more lateral into the trochanter and then progressed up to a size 4 stem which  filled anterior to posterior very tightly.  I did not think I could get a 5 and due to anterior posterior tightness.  Permanent stem was inserted after trial showed leg lengths were equal good stability external rotation 90 degrees leg taken halfway down the floor no subluxation.  Apartment stem inserted.  Ceramic +1.5 ball identical fluoroscopic pictures confirmed equal leg lengths.  Operative field was dry V-Loc running closure of the fascia 2 on the subcutaneous tissue skin staple closure Marcaine infiltration into the subtenons tissue and skin and then transferred to cover room.  Patient tolerated procedure well.

## 2022-03-15 NOTE — Transfer of Care (Signed)
Immediate Anesthesia Transfer of Care Note  Patient: CLESTON LAUTNER  Procedure(s) Performed: RIGHT TOTAL HIP ARTHROPLASTY ANTERIOR APPROACH (Right: Hip)  Patient Location: PACU  Anesthesia Type:Spinal  Level of Consciousness: awake, alert , and oriented  Airway & Oxygen Therapy: Patient Spontanous Breathing  Post-op Assessment: Report given to RN and Post -op Vital signs reviewed and stable  Post vital signs: Reviewed and stable  Last Vitals:  Vitals Value Taken Time  BP 131/107 03/15/22 1431  Temp    Pulse 91 03/15/22 1433  Resp 12 03/15/22 1433  SpO2 94 % 03/15/22 1433  Vitals shown include unvalidated device data.  Last Pain:  Vitals:   03/15/22 1041  PainSc: 7       Patients Stated Pain Goal: 3 (54/00/86 7619)  Complications: No notable events documented.

## 2022-03-15 NOTE — Evaluation (Signed)
Physical Therapy Evaluation Patient Details Name: Don Fletcher MRN: [id number] DOB: Sep 11, 1970 Today's Date: 03/15/2022  History of Present Illness  52 y.o. male presents to [facility] hospital on 03/15/2022 for elective R THA. PMH includes bipolar disorder, CAD, HLD, HTN, ischemic cardiomyopathy, schizophrenia.  Clinical Impression  Pt presents to PT with deficits in strength, power, gait, balance, endurance. Pt is limited by R hip pain and nausea at this time, but perseveres to initiate gait training. Pt with assistance of PT to initiate mobility of RLE at bed level. PT provides education on HEP. Pt will benefit from aggressive mobilization in an effort to improve activity tolerance and to restore independence.       Recommendations for follow up therapy are one component of a multi-disciplinary discharge planning process, led by the attending physician.  Recommendations may be updated based on patient status, additional functional criteria and insurance authorization.  Follow Up Recommendations Follow physician's recommendations for discharge plan and follow up therapies      Assistance Recommended at Discharge PRN  Patient can return home with the following  A little help with walking and/or transfers;A little help with bathing/dressing/bathroom;Assistance with cooking/housework;Assist for transportation;Help with stairs or ramp for entrance    Equipment Recommendations None recommended by PT  Recommendations for Other Services       Functional Status Assessment Patient has had a recent decline in their functional status and demonstrates the ability to make significant improvements in function in a reasonable and predictable amount of time.     Precautions / Restrictions Precautions Precautions: Fall Restrictions Weight Bearing Restrictions: Yes RLE Weight Bearing: Weight bearing as tolerated      Mobility  Bed Mobility Overal bed mobility: Needs Assistance Bed Mobility: Rolling,  Sidelying to Sit, Sit to Supine Rolling: Min assist Sidelying to sit: Min assist   Sit to supine: Min assist   General bed mobility comments: assist for RLE management    Transfers Overall transfer level: Needs assistance Equipment used: Rolling walker (2 wheels) Transfers: Sit to/from Stand Sit to Stand: Min guard                Ambulation/Gait Ambulation/Gait assistance: Min guard Gait Distance (Feet): 2 Feet Assistive device: Rolling walker (2 wheels) Gait Pattern/deviations: Step-to pattern Gait velocity: reduced Gait velocity interpretation: <1.31 ft/sec, indicative of household ambulator   General Gait Details: slow step-to gait, reduced stance time on RLE  Stairs            Wheelchair Mobility    Modified Rankin (Stroke Patients Only)       Balance Overall balance assessment: Needs assistance Sitting-balance support: No upper extremity supported, Feet supported Sitting balance-Leahy Scale: Good     Standing balance support: Bilateral upper extremity supported, Reliant on assistive device for balance Standing balance-Leahy Scale: Poor                               Pertinent Vitals/Pain Pain Assessment Pain Assessment: 0-10 Pain Score: 10-Worst pain ever Pain Location: R hip Pain Descriptors / Indicators: Sore Pain Intervention(s): Limited activity within patient's tolerance    Home Living Family/patient expects to be discharged to:: Private residence Living Arrangements: Spouse/significant other Available Help at Discharge: Family;Available 24 hours/day Type of Home: House Home Access: Level entry       Home Layout: One level Home Equipment: Conservation officer, nature (2 wheels);Cane - single point      Prior Function Prior Level  of Function : Independent/Modified Independent             Mobility Comments: ambulates with SPC       Hand Dominance   Dominant Hand: Right    Extremity/Trunk Assessment   Upper Extremity  Assessment Upper Extremity Assessment: Overall WFL for tasks assessed    Lower Extremity Assessment Lower Extremity Assessment: RLE deficits/detail RLE Deficits / Details: generalized weakness, 3/5 PF/DF, 3/5 knee extension    Cervical / Trunk Assessment Cervical / Trunk Assessment: Normal  Communication   Communication: No difficulties  Cognition Arousal/Alertness: Awake/alert Behavior During Therapy: WFL for tasks assessed/performed Overall Cognitive Status: Within Functional Limits for tasks assessed                                          General Comments General comments (skin integrity, edema, etc.): VSS on RA, pt with nausea, dry heaving during session, RN provides zofran at end of mobility    Exercises     Assessment/Plan    PT Assessment Patient needs continued PT services  PT Problem List Decreased strength;Decreased range of motion;Decreased activity tolerance;Decreased balance;Decreased mobility;Decreased knowledge of use of DME;Pain       PT Treatment Interventions DME instruction;Gait training;Functional mobility training;Therapeutic activities;Therapeutic exercise;Balance training;Neuromuscular re-education;Patient/family education    PT Goals (Current goals can be found in the Care Plan section)  Acute Rehab PT Goals Patient Stated Goal: to reduce pain, go home PT Goal Formulation: With patient Time For Goal Achievement: 03/20/22 Potential to Achieve Goals: Good    Frequency 7X/week     Co-evaluation               AM-PAC PT "6 Clicks" Mobility  Outcome Measure Help needed turning from your back to your side while in a flat bed without using bedrails?: A Little Help needed moving from lying on your back to sitting on the side of a flat bed without using bedrails?: A Little Help needed moving to and from a bed to a chair (including a wheelchair)?: A Little Help needed standing up from a chair using your arms (e.g., wheelchair  or bedside chair)?: A Little Help needed to walk in hospital room?: Total Help needed climbing 3-5 steps with a railing? : Total 6 Click Score: 14    End of Session   Activity Tolerance: Patient limited by pain Patient left: in bed;with call bell/phone within reach;with bed alarm set Nurse Communication: Mobility status PT Visit Diagnosis: Other abnormalities of gait and mobility (R26.89);Muscle weakness (generalized) (M62.81);Pain Pain - Right/Left: Right Pain - part of body: Hip    Time: 1710-1733 PT Time Calculation (min) (ACUTE ONLY): 23 min   Charges:   PT Evaluation $PT Eval Low Complexity: Powell, PT, DPT Acute Rehabilitation Office [phone]   Zenaida Niece 03/15/2022, 5:42 PM

## 2022-03-15 NOTE — Plan of Care (Signed)
  Problem: Activity: Goal: Ability to avoid complications of mobility impairment will improve Outcome: Progressing   Problem: Pain Management: Goal: Pain level will decrease with appropriate interventions Outcome: Progressing   Problem: Skin Integrity: Goal: Will show signs of wound healing Outcome: Progressing   Problem: Education: Goal: Knowledge of General Education information will improve Description: Including pain rating scale, medication(s)/side effects and non-pharmacologic comfort measures Outcome: Progressing   Problem: Activity: Goal: Risk for activity intolerance will decrease Outcome: Progressing   Problem: Pain Managment: Goal: General experience of comfort will improve Outcome: Progressing   Problem: Safety: Goal: Ability to remain free from injury will improve Outcome: Progressing   Problem: Skin Integrity: Goal: Risk for impaired skin integrity will decrease Outcome: Progressing

## 2022-03-16 ENCOUNTER — Other Ambulatory Visit: Payer: Self-pay

## 2022-03-16 ENCOUNTER — Encounter (HOSPITAL_COMMUNITY): Payer: Self-pay | Admitting: Orthopaedic Surgery

## 2022-03-16 DIAGNOSIS — Z7982 Long term (current) use of aspirin: Secondary | ICD-10-CM | POA: Diagnosis not present

## 2022-03-16 DIAGNOSIS — F17210 Nicotine dependence, cigarettes, uncomplicated: Secondary | ICD-10-CM | POA: Diagnosis not present

## 2022-03-16 DIAGNOSIS — I2510 Atherosclerotic heart disease of native coronary artery without angina pectoris: Secondary | ICD-10-CM | POA: Diagnosis not present

## 2022-03-16 DIAGNOSIS — M1611 Unilateral primary osteoarthritis, right hip: Secondary | ICD-10-CM | POA: Diagnosis not present

## 2022-03-16 DIAGNOSIS — Z79899 Other long term (current) drug therapy: Secondary | ICD-10-CM | POA: Diagnosis not present

## 2022-03-16 DIAGNOSIS — Z955 Presence of coronary angioplasty implant and graft: Secondary | ICD-10-CM | POA: Diagnosis not present

## 2022-03-16 DIAGNOSIS — Z7902 Long term (current) use of antithrombotics/antiplatelets: Secondary | ICD-10-CM | POA: Diagnosis not present

## 2022-03-16 DIAGNOSIS — I10 Essential (primary) hypertension: Secondary | ICD-10-CM | POA: Diagnosis not present

## 2022-03-16 LAB — BASIC METABOLIC PANEL
Anion gap: 8 (ref 5–15)
BUN: 14 mg/dL (ref 6–20)
CO2: 24 mmol/L (ref 22–32)
Calcium: 8.4 mg/dL — ABNORMAL LOW (ref 8.9–10.3)
Chloride: 100 mmol/L (ref 98–111)
Creatinine, Ser: 1.12 mg/dL (ref 0.61–1.24)
GFR, Estimated: >60 mL/min (ref >60)
Glucose, Bld: 125 mg/dL — ABNORMAL HIGH (ref 70–99)
Potassium: 4.1 mmol/L (ref 3.5–5.1)
Sodium: 132 mmol/L — ABNORMAL LOW (ref 135–145)

## 2022-03-16 LAB — CBC
HCT: 40.7 % (ref 39.0–52.0)
Hemoglobin: 14.6 g/dL (ref 13.0–17.0)
MCH: 32.4 pg (ref 26.0–34.0)
MCHC: 35.9 g/dL (ref 30.0–36.0)
MCV: 90.2 fL (ref 80.0–100.0)
Platelets: 215 10*3/uL (ref 150–400)
RBC: 4.51 MIL/uL (ref 4.22–5.81)
RDW: 13 % (ref 11.5–15.5)
WBC: 10.1 10*3/uL (ref 4.0–10.5)
nRBC: 0 % (ref 0.0–0.2)

## 2022-03-16 MED ORDER — ASPIRIN 325 MG PO TBEC: 325.0000 mg | DELAYED_RELEASE_TABLET | Freq: Every day | ORAL | Status: DC

## 2022-03-16 MED ORDER — OXYCODONE-ACETAMINOPHEN 5-325 MG PO TABS: 1.0000 | ORAL_TABLET | Freq: Four times a day (QID) | ORAL | 0 refills | Status: DC | PRN

## 2022-03-16 MED ORDER — METHOCARBAMOL 500 MG PO TABS: 500.0000 mg | ORAL_TABLET | Freq: Four times a day (QID) | ORAL | 0 refills | Status: DC | PRN

## 2022-03-16 NOTE — Discharge Instructions (Signed)

## 2022-03-16 NOTE — Progress Notes (Signed)
Patient ID: Don Fletcher, male   DOB: 07/13/1970, 52 y.o.   MRN: [id number]   Subjective: 1 Day Post-Op Procedure(s) (LRB): RIGHT TOTAL HIP ARTHROPLASTY ANTERIOR APPROACH (Right) Patient reports pain as mild and moderate.    Objective: Vital signs in last 24 hours: Temp:  [97.5 F (36.4 C)-98.7 F (37.1 C)] 98.7 F (37.1 C) (02/08 0721) Pulse Rate:  [69-92] 90 (02/08 0721) Resp:  [11-18] 16 (02/08 0721) BP: (99-151)/(65-107) 133/65 (02/08 0721) SpO2:  [91 %-100 %] 94 % (02/08 0721) Weight:  [78.5 kg] 78.5 kg (02/07 0943)  Intake/Output from previous day: 02/07 0701 - 02/08 0700 In: 2410 [P.O.:460; I.V.:1950] Out: 2125 [Urine:1925; Blood:200] Intake/Output this shift: No intake/output data recorded.  Recent Labs    03/16/22 0218  HGB 14.6   Recent Labs    03/16/22 0218  WBC 10.1  RBC 4.51  HCT 40.7  PLT 215   Recent Labs    03/16/22 0218  NA 132*  K 4.1  CL 100  CO2 24  BUN 14  CREATININE 1.12  GLUCOSE 125*  CALCIUM 8.4*   No results for input(s): "LABPT", "INR" in the last 72 hours.  Neurologically intact DG HIP UNILAT WITH PELVIS 1V RIGHT  Result Date: 03/15/2022 CLINICAL DATA:  Total right hip arthroplasty. Intraoperative fluoroscopy. EXAM: DG HIP (WITH OR WITHOUT PELVIS) 1V RIGHT COMPARISON:  Pelvis and right hip radiographs 06/30/2021 FINDINGS: Images were performed intraoperatively without the presence of a radiologist. Interval total right hip arthroplasty. No hardware complication is seen. Total fluoroscopy images: Total fluoroscopy time: 14 seconds Total dose: Radiation Exposure Index (as provided by the fluoroscopic device): 1.66 mGy air Kerma Please see intraoperative findings for further detail. IMPRESSION: Intraoperative fluoroscopy provided for total right hip arthroplasty. Electronically Signed   By: Yvonne Kendall M.D.   On: 03/15/2022 14:22   DG C-Arm 1-60 Min-No Report  Result Date: 03/15/2022 Fluoroscopy was utilized by the requesting  physician.  No radiographic interpretation.   DG C-Arm 1-60 Min-No Report  Result Date: 03/15/2022 Fluoroscopy was utilized by the requesting physician.  No radiographic interpretation.    Assessment/Plan: 1 Day Post-Op Procedure(s) (LRB): RIGHT TOTAL HIP ARTHROPLASTY ANTERIOR APPROACH (Right) Up with therapy, aquacell dressing change by RN ordered. PT this AM and likley home this afternoon.   [name] 03/16/2022, 7:29 AM

## 2022-03-16 NOTE — Progress Notes (Cosign Needed)
    Durable Medical Equipment  (From admission, onward)           Start     Ordered   03/15/22 1603  DME Walker rolling  Once       Question Answer Comment  Walker: With 5 Inch Wheels   Patient needs a walker to treat with the following condition H/O total hip arthroplasty, right      03/15/22 1602   Unscheduled  For home use only DME Bedside commode  Once       Comments: Confine to one room  Question:  Patient needs a bedside commode to treat with the following condition  Answer:  S/P total hip arthroplasty   03/16/22 1011

## 2022-03-16 NOTE — Evaluation (Signed)
Occupational Therapy Evaluation Patient Details Name: Don Fletcher MRN: [id number] DOB: 15-Feb-1970 Today's Date: 03/16/2022   History of Present Illness 52 y.o. male presents to [facility] hospital on 03/15/2022 for elective R THA. PMH includes bipolar disorder, CAD, HLD, HTN, ischemic cardiomyopathy, schizophrenia.   Clinical Impression   PTA, pt lived with his wife and was independent. Upon eval, pt requires RW and min guard A for ambulatory transfers, up to min A for LB ADL, and sset-up for UB ADL. Pt educated and demonstrating use of compensatory techniques for LB ADL, sleep positioning, bed mobility, and tub/shower transfers. Recommended BSC to keep in his bedroom for night time use to reduce fall risk during night time trips to restroom. Pt's wife available to assist with LB dressing as needed. Recommending discharge home and follow physician recommendation for follow up therapies.      Recommendations for follow up therapy are one component of a multi-disciplinary discharge planning process, led by the attending physician.  Recommendations may be updated based on patient status, additional functional criteria and insurance authorization.   Follow Up Recommendations  Follow physician's recommendations for discharge plan and follow up therapies     Assistance Recommended at Discharge Intermittent Supervision/Assistance  Patient can return home with the following A little help with walking and/or transfers;A little help with bathing/dressing/bathroom;Assistance with cooking/housework;Assist for transportation;Help with stairs or ramp for entrance    Functional Status Assessment  Patient has had a recent decline in their functional status and demonstrates the ability to make significant improvements in function in a reasonable and predictable amount of time.  Equipment Recommendations  BSC/3in1 (Pt with increased fall risk s/p THA, and would benefit from [facility] for night time use to reduce risk of  falls)    Recommendations for Other Services       Precautions / Restrictions Precautions Precautions: Fall Restrictions Weight Bearing Restrictions: Yes RLE Weight Bearing: Weight bearing as tolerated      Mobility Bed Mobility Overal bed mobility: Needs Assistance Bed Mobility: Sit to Supine, Supine to Sit     Supine to sit: Min assist Sit to supine: Min assist   General bed mobility comments: assist for RLE management    Transfers Overall transfer level: Needs assistance Equipment used: Rolling walker (2 wheels) Transfers: Sit to/from Stand Sit to Stand: Min guard           General transfer comment: for safety      Balance Overall balance assessment: Needs assistance Sitting-balance support: No upper extremity supported, Feet supported Sitting balance-Leahy Scale: Good     Standing balance support: Bilateral upper extremity supported, Reliant on assistive device for balance Standing balance-Leahy Scale: Poor Standing balance comment: reliant on UE support                           ADL either performed or assessed with clinical judgement   ADL Overall ADL's : Needs assistance/impaired Eating/Feeding: Independent   Grooming: Supervision/safety;Standing   Upper Body Bathing: Set up;Sitting   Lower Body Bathing: Min guard;Sit to/from stand   Upper Body Dressing : Set up;Sitting   Lower Body Dressing: Min guard;Sit to/from stand Lower Body Dressing Details (indicate cue type and reason): donning pants sitting EOB. Reviewed compensatory techniques for donning socks, and pt reporting wife can assist at home if he has any difficulty Toilet Transfer: Min guard;Ambulation;Rolling walker (2 wheels);Regular Glass blower/designer Details (indicate cue type and reason): simulated from low EOB Toileting-  Clothing Manipulation and Hygiene: Modified independent;Sitting/lateral lean   Tub/ Shower Transfer: Tub transfer;Min guard;Ambulation;Rolling  walker (2 wheels);Adhering to hip precautions (anterior hip for comfort; no true movement restrictions noted in chart or op note) Tub/Shower Transfer Details (indicate cue type and reason): Min guard A, cueing for safety/new learning. Providing handout Functional mobility during ADLs: Min guard;Rolling walker (2 wheels) General ADL Comments: reliant on UE to reduce weight through RLE     Vision Baseline Vision/History: 0 No visual deficits Ability to See in Adequate Light: 0 Adequate Patient Visual Report: No change from baseline Vision Assessment?: No apparent visual deficits     Perception     Praxis      Pertinent Vitals/Pain Pain Assessment Pain Assessment: Faces Faces Pain Scale: Hurts even more Pain Location: R hip Pain Descriptors / Indicators: Sore Pain Intervention(s): Limited activity within patient's tolerance, Monitored during session     Hand Dominance Right   Extremity/Trunk Assessment Upper Extremity Assessment Upper Extremity Assessment: Overall WFL for tasks assessed (decr grip strength observed when reaching in belongings bag to pull out coat, but overall WFL)   Lower Extremity Assessment Lower Extremity Assessment: Defer to PT evaluation   Cervical / Trunk Assessment Cervical / Trunk Assessment: Normal   Communication Communication Communication: No difficulties   Cognition Arousal/Alertness: Awake/alert Behavior During Therapy: WFL for tasks assessed/performed Overall Cognitive Status: Within Functional Limits for tasks assessed                                       General Comments  VSS on RA    Exercises     Shoulder Instructions      Home Living Family/patient expects to be discharged to:: Private residence Living Arrangements: Spouse/significant other Available Help at Discharge: Family;Available 24 hours/day Type of Home: House Home Access: Level entry     Home Layout: One level     Bathroom Shower/Tub:  Teacher, early years/pre: Standard     Home Equipment: Conservation officer, nature (2 wheels);Cane - single point          Prior Functioning/Environment Prior Level of Function : Independent/Modified Independent             Mobility Comments: ambulates with SPC ADLs Comments: wife will assist with more ADL when his back is hurting, but otherwise independent        OT Problem List: Decreased strength;Decreased activity tolerance;Impaired balance (sitting and/or standing);Decreased knowledge of use of DME or AE;Decreased knowledge of precautions;Pain      OT Treatment/Interventions: Self-care/ADL training;Therapeutic exercise;DME and/or AE instruction;Balance training;Patient/family education;Therapeutic activities    OT Goals(Current goals can be found in the care plan section) Acute Rehab OT Goals Patient Stated Goal: go home and decr pain OT Goal Formulation: With patient Time For Goal Achievement: 03/30/22 Potential to Achieve Goals: Good  OT Frequency: Min 2X/week    Co-evaluation              AM-PAC OT "6 Clicks" Daily Activity     Outcome Measure Help from another person eating meals?: None Help from another person taking care of personal grooming?: A Little Help from another person toileting, which includes using toliet, bedpan, or urinal?: A Little Help from another person bathing (including washing, rinsing, drying)?: A Little Help from another person to put on and taking off regular upper body clothing?: A Little Help from another person to put on  and taking off regular lower body clothing?: A Little 6 Click Score: 19   End of Session Equipment Utilized During Treatment: Gait belt;Rolling walker (2 wheels) Nurse Communication: Mobility status;Other (comment) (passing RN in hall and she asked about how session went and if pt was in pain. Reported yes and RN to offer pain medication)  Activity Tolerance: Patient tolerated treatment well Patient left: in  bed;with call bell/phone within reach;with bed alarm set  OT Visit Diagnosis: Unsteadiness on feet (R26.81);Muscle weakness (generalized) (M62.81);Other abnormalities of gait and mobility (R26.89);Pain Pain - Right/Left: Right Pain - part of body: Hip                Time: 8938-1017 OT Time Calculation (min): 22 min Charges:  OT General Charges $OT Visit: 1 Visit OT Evaluation $OT Eval Low Complexity: 1 Low  Elder Cyphers, OTR/L [facility] Acute Rehabilitation Office: [phone]   Magnus Ivan 03/16/2022, 8:29 AM

## 2022-03-16 NOTE — Discharge Summary (Signed)
Physician Discharge Summary  Patient ID: Don Fletcher MRN: [id number] DOB/AGE: [birth year] 53 y.o.  Admit date: 03/15/2022 Discharge date: 03/16/2022  Admission Diagnoses: Right hip primary osteoarthritis  Discharge Diagnoses: Same Principal Problem:   Status post total hip replacement, right   Discharged Condition: good  Hospital Course: Patient admitted for right total of arthroplasty for severe right hip osteoarthritis failed conservative treatment.  He underwent the procedure did well with surgery under spinal anesthesia was seen by physical therapy was ambulatory the following day on 03/16/2022 and was discharged home on aspirin 1 a day 325 mg x 4 weeks then he goes back on 1 baby aspirin which she was on prior to surgery.  Prescription for Robaxin and Percocet for pain.  Consults:  Occupational Therapy physical therapy.  Significant Diagnostic Studies:   Treatments: Surgery right total hip arthroplasty direct anterior approach.  Spinal anesthesia.  Discharge Exam: Blood pressure 133/65, pulse 90, temperature 98.7 F (37.1 C), temperature source Oral, resp. rate 16, height 5\' 7"  (1.702 m), weight 78.5 kg, SpO2 94 %. Extremities: Leg lengths are equal patient was able to ambulate with physical therapy using walker up and down the hall.  Disposition: Discharge disposition: 01-Home or Self Care      Office follow-up 1 week.  Allergies as of 03/16/2022       Reactions   Suboxone [buprenorphine Hcl-naloxone Hcl] Other (See Comments)   Caused kidney failure   Benadryl [diphenhydramine] Other (See Comments)   LEG CRAMPING   Seroquel [quetiapine] Other (See Comments)   "makes my legs cramp"        Medication List     TAKE these medications    aspirin EC 325 MG tablet Take 1 tablet (325 mg total) by mouth daily with breakfast. What changed:  medication strength how much to take when to take this additional instructions   benazepril 40 MG tablet Commonly known as:  LOTENSIN Take 1 tablet (40 mg total) by mouth daily.   carvedilol 6.25 MG tablet Commonly known as: COREG Take 1 tablet (6.25 mg total) by mouth 2 (two) times daily with a meal.   clopidogrel 75 MG tablet Commonly known as: Plavix Take 1 tablet (75 mg total) by mouth daily.   ezetimibe 10 MG tablet Commonly known as: Zetia Take 1 tablet (10 mg total) by mouth daily.   hydrOXYzine 25 MG tablet Commonly known as: ATARAX Take 1 tablet (25 mg total) by mouth 3 (three) times daily as needed.   methocarbamol 500 MG tablet Commonly known as: ROBAXIN Take 1 tablet (500 mg total) by mouth every 6 (six) hours as needed for muscle spasms.   mirtazapine 30 MG tablet Commonly known as: REMERON Take 1 tablet (30 mg total) by mouth at bedtime.   oxyCODONE-acetaminophen 5-325 MG tablet Commonly known as: Percocet Take 1-2 tablets by mouth every 6 (six) hours as needed for severe pain.   pantoprazole 20 MG tablet Commonly known as: Protonix Take 1 tablet (20 mg total) by mouth daily.   risperiDONE 1 MG tablet Commonly known as: RISPERDAL Take 1 mg by mouth 2 (two) times daily.               Durable Medical Equipment  (From admission, onward)           Start     Ordered   03/16/22 1011  For home use only DME Bedside commode  Once       Comments: Confine to one room  Question:  Patient needs a bedside commode to treat with the following condition  Answer:  S/P total hip arthroplasty   03/16/22 1011   03/15/22 1603  DME Walker rolling  Once       Question Answer Comment  Walker: With [place]   Patient needs a walker to treat with the following condition H/O total hip arthroplasty, right      03/15/22 1602            Follow-up Information     Persons, Bevely Palmer, PA Follow up in 1 week(s).   Specialty: Orthopedic Surgery Contact information: [address] [place] [address] [phone]                 Signed: [name] 03/16/2022, 1:02  PM

## 2022-03-16 NOTE — Progress Notes (Signed)
Dressing is changed. Minimal drainage is noted on the incision side. Discharge instructions are given to the pt. All questions answered.

## 2022-03-16 NOTE — Progress Notes (Signed)
Physical Therapy Treatment Patient Details Name: Don Fletcher MRN: [id number] DOB: 06/11/1970 Today's Date: 03/16/2022   History of Present Illness 52 y.o. male presents to [facility] hospital on 03/15/2022 for elective R THA. PMH includes bipolar disorder, CAD, HLD, HTN, ischemic cardiomyopathy, schizophrenia.    PT Comments    Pt received in supine, agreeable to therapy session with encouragement after premedication, pt c/o increased pain after working with OT in AM. Pt making good progress toward goals, able to progress to household distance gait trial with RW support and encouragement, and needing min guard mostly for transfers, bed mobility and gait. Pt spouse present and instructed on assisting him with HEP, guarding positions with gait belt for transfers/gait and use of ice PRN throughout day for pain/swelling. Pt HR max 125 bpm with exertion and pt c/o severe pain despite premedication with PO meds prior to session. Pt continues to benefit from PT services to progress toward functional mobility goals, anticipate he is safe to DC home from a functional mobility standpoint once medically cleared.   Recommendations for follow up therapy are one component of a multi-disciplinary discharge planning process, led by the attending physician.  Recommendations may be updated based on patient status, additional functional criteria and insurance authorization.  Follow Up Recommendations  Follow physician's recommendations for discharge plan and follow up therapies     Assistance Recommended at Discharge PRN  Patient can return home with the following A little help with walking and/or transfers;A little help with bathing/dressing/bathroom;Assistance with cooking/housework;Assist for transportation;Help with stairs or ramp for entrance   Equipment Recommendations  None recommended by PT (pt has RW at home)    Recommendations for Other Services       Precautions / Restrictions Precautions Precautions:  Fall Restrictions Weight Bearing Restrictions: Yes RLE Weight Bearing: Weight bearing as tolerated     Mobility  Bed Mobility Overal bed mobility: Needs Assistance Bed Mobility: Supine to Sit     Supine to sit: Min guard, Min assist     General bed mobility comments: assist for RLE management intermittently, mostly min guard with dense cues for improved body mechanics; pt also instructed on using "good" leg under sore leg to raise back onto bed vs gait belt as leg lifter    Transfers Overall transfer level: Needs assistance Equipment used: Rolling walker (2 wheels) Transfers: Sit to/from Stand Sit to Stand: Min guard           General transfer comment: for safety, cues for safe UE/LE placement    Ambulation/Gait Ambulation/Gait assistance: Min guard Gait Distance (Feet): 100 Feet Assistive device: Rolling walker (2 wheels) Gait Pattern/deviations: Step-to pattern, Antalgic, Decreased step length - left, Decreased weight shift to right Gait velocity: reduced     General Gait Details: slow step-to gait, reduced stance time on RLE; cues for optimal step length for decreased pain, RW adjusted for better UE ability to offload sore leg, pt HR to 125 bpm with exertion; SpO2 WFL on RA.   Stairs Stairs:  (N/A, pt does not have steps)             Balance Overall balance assessment: Needs assistance Sitting-balance support: No upper extremity supported, Feet supported Sitting balance-Leahy Scale: Good     Standing balance support: Bilateral upper extremity supported, During functional activity Standing balance-Leahy Scale: Fair Standing balance comment: reliant on UE support of RW for dynamic tasks; pt stood briefly unsupported static standing without LOB  Cognition Arousal/Alertness: Awake/alert Behavior During Therapy: WFL for tasks assessed/performed Overall Cognitive Status: Within Functional Limits for tasks assessed                                           Exercises Total Joint Exercises Ankle Circles/Pumps: AROM, Both, 10 reps, Supine Quad Sets: AROM, AAROM, Both, 10 reps, Supine (tactile cues as pt had difficulty understanding at first) Short Arc Quad: AROM, Right, 10 reps, Supine Heel Slides: AAROM, AROM, Right, 10 reps, Supine (limited ROM without AA) Hip ABduction/ADduction: AAROM, Right, 10 reps, Supine Straight Leg Raises:  (pt limited due to pain so needs AA to PROM to raise leg while repositioning) Long Arc Quad: AROM, Right, 10 reps, Seated    General Comments General comments (skin integrity, edema, etc.): HR max 125 bpm with exertion; HR 104-111 bpm seated EOB; SpO2 WFL on RA      Pertinent Vitals/Pain Pain Assessment Pain Assessment: 0-10 Pain Score: 9  Pain Location: R hip Pain Descriptors / Indicators: Sore, Grimacing, Operative site guarding Pain Intervention(s): Monitored during session, Premedicated before session, Repositioned, Ice applied    Home Living Family/patient expects to be discharged to:: Private residence Living Arrangements: Spouse/significant other Available Help at Discharge: Family;Available 24 hours/day Type of Home: House Home Access: Level entry       Home Layout: One level Home Equipment: Conservation officer, nature (2 wheels);Cane - single point       PT Goals (current goals can now be found in the care plan section) Acute Rehab PT Goals Patient Stated Goal: to reduce pain, go home PT Goal Formulation: With patient Time For Goal Achievement: 03/20/22 Progress towards PT goals: Progressing toward goals    Frequency    7X/week      PT Plan Current plan remains appropriate       AM-PAC PT "6 Clicks" Mobility   Outcome Measure  Help needed turning from your back to your side while in a flat bed without using bedrails?: A Little Help needed moving from lying on your back to sitting on the side of a flat bed without using bedrails?: A  Little Help needed moving to and from a bed to a chair (including a wheelchair)?: A Little Help needed standing up from a chair using your arms (e.g., wheelchair or bedside chair)?: A Little Help needed to walk in hospital room?: A Little Help needed climbing 3-5 steps with a railing? : A Lot 6 Click Score: 17    End of Session Equipment Utilized During Treatment: Gait belt Activity Tolerance: Patient tolerated treatment well;Patient limited by pain Patient left: in chair;with call bell/phone within reach;with family/visitor present;Other (comment) (spouse present, she reports she can assist him for OOB to bathroom after teachback by PTA and RN notified pt up in chair) Nurse Communication: Mobility status;Other (comment) (OK for DC from PTA perspective once medically cleared, pain limited) PT Visit Diagnosis: Other abnormalities of gait and mobility (R26.89);Muscle weakness (generalized) (M62.81);Pain Pain - Right/Left: Right Pain - part of body: Hip     Time: 8299-3716 PT Time Calculation (min) (ACUTE ONLY): 35 min  Charges:  $Gait Training: 8-22 mins $Therapeutic Exercise: 8-22 mins                     Shelene Krage P., PTA Acute Rehabilitation Services Secure Chat Preferred 9a-5:30pm Office: [place] 03/16/2022,  11:23 AM

## 2022-03-16 NOTE — TOC Transition Note (Signed)
Transition of Care [facility]) - CM/SW Discharge Note   Patient Details  Name: Don Fletcher MRN: [id number] Date of Birth: September 03, 1970  [facility] CM/SW Contact:  Sharin Mons, RN Phone Number: 03/16/2022, 10:13 AM   Clinical Narrative:    Patient will DC to: home Anticipated DC date: 03/16/2022 Family notified: yes Transport by: car         S/p total right hip arthroplasty , 2/7 Per MD patient ready for DC today pending therapy clearance. RN, patient, and patient's family aware of DC plan. PTA independent with ADL's, no DME usage. Pt already with RW @ home. Referral made for [facility] with [facility]. Equipment will deliver to floor prior to d/c. Pt without Rx med concerns. Wife to assist with care once d/c. Post hospital f/u noted on AVS. Pt without transportation issues.  RNCM will sign off for now as intervention is no longer needed. Please consult [place] again if new needs arise.   Final next level of care: Home/Self Care Barriers to Discharge: No Barriers Identified   Patient Goals and CMS Choice      Discharge Placement                         Discharge Plan and Services Additional resources added to the After Visit Summary for                                       Social Determinants of Health (SDOH) Interventions SDOH Screenings   Food Insecurity: No Food Insecurity (03/15/2022)  Housing: Low Risk  (03/15/2022)  Transportation Needs: No Transportation Needs (03/15/2022)  Utilities: Not At Risk (03/15/2022)  Depression (PHQ2-9): High Risk (12/12/2021)  Physical Activity: Inactive (06/30/2018)  Social Connections: Somewhat Isolated (06/30/2018)  Stress: Stress Concern Present (06/30/2018)  Tobacco Use: High Risk (03/15/2022)     Readmission Risk Interventions     No data to display

## 2022-03-17 ENCOUNTER — Telehealth: Payer: Self-pay

## 2022-03-17 NOTE — Telephone Encounter (Signed)
Transition Care Management Follow-up Telephone Call Date of discharge and from where: [facility] 03/16/2022 dx: right hip arthroplasty How have you been since you were released from the hospital? sore Any questions or concerns? No  Items Reviewed: Did the pt receive and understand the discharge instructions provided? Yes  Medications obtained and verified? Yes  Other? No  Any new allergies since your discharge? No  Dietary orders reviewed? Yes Do you have support at home? Yes   Home Care and Equipment/Supplies: Were home health services ordered? no If so, what is the name of the agency? N/a  Has the agency set up a time to come to the patient's home? not applicable Were any new equipment or medical supplies ordered?  Yes: bedside commode What is the name of the medical supply agency? N/a Were you able to get the supplies/equipment? yes Do you have any questions related to the use of the equipment or supplies? No  Functional Questionnaire: (I = Independent and D = Dependent) ADLs: I  Bathing/Dressing- D  Meal Prep- D  Eating- I  Maintaining continence- I  Transferring/Ambulation- D  Managing Meds- I  Follow up appointments reviewed:  PCP Hospital f/u appt confirmed? Yes  Scheduled to see Dr Doren Custard on 04/06/2022 @ 1:00. [facility] f/u appt confirmed? Yes  Scheduled to see  Bevely Palmer Persons  on 2/14/202 @ 10:30. Are transportation arrangements needed? No  If their condition worsens, is the pt aware to call PCP or go to the Emergency Dept.? Yes Was the patient provided with contact information for the PCP's office or ED? Yes Was to pt encouraged to call back with questions or concerns? Yes  Juanda Crumble, LPN [place] Direct Dial [phone]

## 2022-03-22 ENCOUNTER — Ambulatory Visit (INDEPENDENT_AMBULATORY_CARE_PROVIDER_SITE_OTHER): Payer: Medicaid Other | Admitting: Physician Assistant

## 2022-03-22 ENCOUNTER — Encounter: Payer: Self-pay | Admitting: Physician Assistant

## 2022-03-22 DIAGNOSIS — M1611 Unilateral primary osteoarthritis, right hip: Secondary | ICD-10-CM

## 2022-03-22 NOTE — Progress Notes (Signed)
Office Visit Note   Patient: Don Fletcher           Date of Birth: 08/24/1970           MRN: [id number] Visit Date: 03/22/2022              Requested by: Don Abraham, MD [address],  [place] [address] PCP: Don Abraham, MD  Chief Complaint  Patient presents with   Right Hip - Routine Post Op      HPI: [name] comes in today he is 1 week postop status post right total hip arthroplasty with Dr. Lorin Mercy.  He denies fever, chills, or calf pain.  He has been taking his aspirin as directed.  He has been taking pain medication which I refilled earlier this week.  He says he still has pain.  He has been ambulating with his walker.  Assessment & Plan: Visit Diagnoses: 1 week status post right hip total arthroplasty  Plan: He is doing quite well.  I explained to him that pain is expected the first couple weeks after surgery.  He does not have any sign of infection and his incision looks great.  He will follow-up in 1 week with Dr. Lorin Mercy  Follow-Up Instructions: Return in about 1 week (around 03/29/2022).   Ortho Exam  Patient is alert, oriented, no adenopathy, well-dressed, normal affect, normal respiratory effort. Right hip dressing was removed he has well opposed wound edges with resolving ecchymosis but no erythema no drainage compartments are soft.  No Homans' sign.  No evidence of a seroma.  He has active flexion and extension of his ankle and knee.  He is tender to palpation over the incision which radiates down to the knee but not beyond  Imaging: No results found. No images are attached to the encounter.  Labs: Lab Results  Component Value Date   HGBA1C 5.6 09/29/2021   HGBA1C 5.4 05/19/2016   HGBA1C 5.5 05/19/2016     Lab Results  Component Value Date   ALBUMIN 3.8 07/23/2020   ALBUMIN 3.8 10/31/2019   ALBUMIN 3.9 10/28/2019    Lab Results  Component Value Date   MG 2.1 06/30/2018   MG 1.8 05/19/2016   No results found for:  "VD25OH"  No results found for: "PREALBUMIN"    Latest Ref Rng & Units 03/16/2022    2:18 AM 03/09/2022   10:59 AM 12/19/2021   11:35 AM  CBC EXTENDED  WBC 4.0 - 10.5 K/uL 10.1  7.5  7.6   RBC 4.22 - 5.81 MIL/uL 4.51  5.39  5.36   Hemoglobin 13.0 - 17.0 g/dL 14.6  17.4  17.2   HCT 39.0 - 52.0 % 40.7  49.0  48.5   Platelets 150 - 400 K/uL 215  259  230      There is no height or weight on file to calculate BMI.  Orders:  No orders of the defined types were placed in this encounter.  No orders of the defined types were placed in this encounter.    Procedures: No procedures performed  Clinical Data: No additional findings.  ROS:  All other systems negative, except as noted in the HPI. Review of Systems  Objective: Vital Signs: There were no vitals taken for this visit.  Specialty Comments:  No specialty comments available.  PMFS History: Patient Active Problem List   Diagnosis Date Noted   Status post total hip replacement, right 03/15/2022  Abnormal nuclear stress test 12/21/2021   Preop cardiovascular exam 12/21/2021   Lung nodule seen on imaging study 12/16/2021   GERD (gastroesophageal reflux disease) 12/16/2021   Folate deficiency 11/16/2021   Left shoulder pain 11/16/2021   Positive screening for depression on 9-item Patient Health Questionnaire (PHQ-9) 09/29/2021   Preventative health care 09/29/2021   Insomnia 09/29/2021   PAD (peripheral artery disease) ([place]) 09/09/2021   Unilateral primary osteoarthritis, right hip 07/21/2021   Encounter for smoking cessation counseling 03/04/2020   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   Cervical stenosis of spine 01/14/2020   Other headache syndrome 01/01/2020   Abnormal CT of the chest 01/01/2020   Syncope and collapse 12/19/2019   Bronchitis 12/19/2019   NSTEMI (non-ST elevated myocardial infarction) ([place]) 06/30/2018   Family hx of colon cancer 02/14/2017   STEMI (ST elevation myocardial infarction) ([place])  05/19/2016   Acute ST elevation myocardial infarction (STEMI) involving left circumflex coronary artery without development of Q waves ([place]) 05/19/2016   Ischemic cardiomyopathy 05/19/2016   Unstable angina ([place]) 08/06/2014   ST elevation myocardial infarction involving right coronary artery (HCC)    CAD (coronary artery disease)    Tobacco abuse    HLD (hyperlipidemia)    Hypertension    Family history of premature CAD 12/16/2013   CAD S/P PCI:  2.0 x 23 vision bare metal stent (2.25 mm) RPL 12/16/2013    Class: Acute   Schizophrenic disorder ([place])    Bipolar 1 disorder ([place])    Chest pain 06/24/2013   Past Medical History:  Diagnosis Date   Arthritis    Bipolar 1 disorder ([place])    CAD (coronary artery disease)    a. 12/16/13 inferolat STEMI s/p BMS to RPL. b. Cath 08/2014: interim occlusion of the PLA stent of the RCA, collateralized from the left coronary artery with diffuse nonobstructive CAD;  c. STEMI/PCI: LM nl, LAD nl, LCX 147md (2.75x12 synergy DES), RCA 30p, RPDA 75ost/p, RPAV 100 w/ L->R collats.   Chronic back pain    GERD (gastroesophageal reflux disease)    HLD (hyperlipidemia)    Hypertension    Ischemic cardiomyopathy    a. 12/2013: EF 50-55% by echo with +WMA. b. EF 45% by cath in 08/2014.   PAD (peripheral artery disease) (HCC)    s/p drug-coated balloon angioplasty right SFA, angioplasty/DES right EIA 09/09/21   Peptic ulcer disease    Schizophrenic disorder ([place]    Tobacco abuse     Family History  Problem Relation Age of Onset   Heart attack Father 410  Heart attack Brother 4105   Past Surgical History:  Procedure Laterality Date   ABDOMINAL AORTOGRAM W/LOWER EXTREMITY Bilateral 09/09/2021   Procedure: ABDOMINAL AORTOGRAM W/LOWER EXTREMITY;  Surgeon: DAngelia Mould MD;  [place] [facility] [place];  Service: Cardiovascular;  Laterality: Bilateral;   CARDIAC CATHETERIZATION N/A 08/07/2014   Procedure: Left Heart Cath and Coronary Angiography;   Surgeon: MSherren Mocha MD;  [place];  Service: Cardiovascular;  Laterality: N/A;   COLONOSCOPY N/A 06/06/2017   Procedure: COLONOSCOPY;  Surgeon: RRogene Houston MD;  [facility];  Service: Endoscopy;  Laterality: N/A;  10:50   CORONARY STENT INTERVENTION N/A 05/19/2016   Procedure: Coronary Stent Intervention;  Surgeon: JLorretta Harp MD;  [place] [facility];  Service: Cardiovascular;  Laterality: N/A;   CORONARY STENT INTERVENTION N/A 07/02/2018   Procedure: CORONARY STENT INTERVENTION;  Surgeon: SBelva Crome MD;  [facility];  Service: Cardiovascular;  Laterality: N/A;   HERNIA REPAIR     LEFT HEART CATH AND CORONARY ANGIOGRAPHY N/A 05/19/2016   Procedure: Left Heart Cath and Coronary Angiography;  Surgeon: Lorretta Harp, MD;  Location: [place] CV LAB;  Service: Cardiovascular;  Laterality: N/A;   LEFT HEART CATH AND CORONARY ANGIOGRAPHY N/A 07/02/2018   Procedure: LEFT HEART CATH AND CORONARY ANGIOGRAPHY;  Surgeon: Belva Crome, MD;  Location: [place] CV LAB;  Service: Cardiovascular;  Laterality: N/A;   LEFT HEART CATH AND CORONARY ANGIOGRAPHY N/A 12/21/2021   Procedure: LEFT HEART CATH AND CORONARY ANGIOGRAPHY;  Surgeon: Leonie Man, MD;  Location: [place] CV LAB;  Service: Cardiovascular;  Laterality: N/A;   LEFT HEART CATHETERIZATION WITH CORONARY ANGIOGRAM N/A 12/16/2013   Procedure: LEFT HEART CATHETERIZATION WITH CORONARY ANGIOGRAM;  Surgeon: Jettie Booze, MD;  Location: [facility] CATH LAB;  Service: Cardiovascular;  Laterality: N/A;   PERCUTANEOUS CORONARY STENT INTERVENTION (PCI-S)  12/16/2013   Procedure: PERCUTANEOUS CORONARY STENT INTERVENTION (PCI-S);  Surgeon: Jettie Booze, MD;  Location: [facility] CATH LAB;  Service: Cardiovascular;;  distal rca   PERIPHERAL VASCULAR BALLOON ANGIOPLASTY  09/09/2021   Procedure: PERIPHERAL VASCULAR BALLOON ANGIOPLASTY;  Surgeon: Angelia Mould, MD;   Location: [place] CV LAB;  Service: Cardiovascular;;  Rt SFA   PERIPHERAL VASCULAR INTERVENTION  09/09/2021   Procedure: PERIPHERAL VASCULAR INTERVENTION;  Surgeon: Angelia Mould, MD;  Location: [place] CV LAB;  Service: Cardiovascular;;  Rt Iliac   TONSILLECTOMY     TOTAL HIP ARTHROPLASTY Right 03/15/2022   Procedure: RIGHT TOTAL HIP ARTHROPLASTY ANTERIOR APPROACH;  Surgeon: [name], MD;  Location: [place];  Service: Orthopedics;  Laterality: Right;  Needs RNFA   Social History   Occupational History   Not on file  Tobacco Use   Smoking status: Every Day    Packs/day: 1.00    Years: 25.00    Total pack years: 25.00    Types: Cigarettes, Cigars    Start date: 05/25/1988   Smokeless tobacco: Former    Types: Snuff    Quit date: 05/21/1988  Vaping Use   Vaping Use: Never used  Substance and Sexual Activity   Alcohol use: No    Alcohol/week: 0.0 standard drinks of alcohol   Drug use: Yes    Types: Marijuana    Comment: last used today   Sexual activity: Not on file

## 2022-03-30 ENCOUNTER — Ambulatory Visit (INDEPENDENT_AMBULATORY_CARE_PROVIDER_SITE_OTHER): Payer: Medicaid Other

## 2022-03-30 ENCOUNTER — Ambulatory Visit (INDEPENDENT_AMBULATORY_CARE_PROVIDER_SITE_OTHER): Payer: Medicaid Other | Admitting: Orthopaedic Surgery

## 2022-03-30 DIAGNOSIS — Z96641 Presence of right artificial hip joint: Secondary | ICD-10-CM | POA: Diagnosis not present

## 2022-03-30 MED ORDER — HYDROCODONE-ACETAMINOPHEN 5-325 MG PO TABS: 1.0000 | ORAL_TABLET | Freq: Four times a day (QID) | ORAL | 0 refills | Status: DC | PRN

## 2022-03-30 NOTE — Progress Notes (Signed)
Post-Op Visit Note   Patient: Don Fletcher           Date of Birth: [birth year]           MRN: [id number] Visit Date: 03/30/2022 PCP: Johnette Abraham, MD   Assessment & Plan: Follow-up total of arthroplasty staples are harvested today incision looks good.  Norco 30 tablets sent in for postop pain.  Stopped oxycodone.  Follow-up 5 weeks.  Chief Complaint:  Chief Complaint  Patient presents with   Right Hip - Routine Post Op    03/15/2022 Right THA   Visit Diagnoses:  1. Status post total replacement of right hip     Plan: New ambulation once his pain is better he can gradually progress to a cane in his left hand.  Follow-up 5 weeks no x-ray needed on return.  Follow-Up Instructions: No follow-ups on file.   Orders:  Orders Placed This Encounter  Procedures   XR HIP UNILAT W OR W/O PELVIS 2-3 VIEWS RIGHT   No orders of the defined types were placed in this encounter.   Imaging: No results found.  PMFS History: Patient Active Problem List   Diagnosis Date Noted   Status post total hip replacement, right 03/15/2022   Abnormal nuclear stress test 12/21/2021   Preop cardiovascular exam 12/21/2021   Lung nodule seen on imaging study 12/16/2021   GERD (gastroesophageal reflux disease) 12/16/2021   Folate deficiency 11/16/2021   Left shoulder pain 11/16/2021   Positive screening for depression on 9-item Patient Health Questionnaire (PHQ-9) 09/29/2021   Preventative health care 09/29/2021   Insomnia 09/29/2021   PAD (peripheral artery disease) ([place]) 09/09/2021   Unilateral primary osteoarthritis, right hip 07/21/2021   Encounter for smoking cessation counseling 03/04/2020   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   Cervical stenosis of spine 01/14/2020   Other headache syndrome 01/01/2020   Abnormal CT of the chest 01/01/2020   Syncope and collapse 12/19/2019   Bronchitis 12/19/2019   NSTEMI (non-ST elevated myocardial infarction) ([place]) 06/30/2018   Family hx of colon  cancer 02/14/2017   STEMI (ST elevation myocardial infarction) ([place]) 05/19/2016   Acute ST elevation myocardial infarction (STEMI) involving left circumflex coronary artery without development of Q waves ([place]) 05/19/2016   Ischemic cardiomyopathy 05/19/2016   Unstable angina ([place]) 08/06/2014   ST elevation myocardial infarction involving right coronary artery (HCC)    CAD (coronary artery disease)    Tobacco abuse    HLD (hyperlipidemia)    Hypertension    Family history of premature CAD 12/16/2013   CAD S/P PCI:  2.0 x 23 vision bare metal stent (2.25 mm) RPL 12/16/2013    Class: Acute   Schizophrenic disorder ([place])    Bipolar 1 disorder ([place])    Chest pain 06/24/2013   Past Medical History:  Diagnosis Date   Arthritis    Bipolar 1 disorder ([place])    CAD (coronary artery disease)    a. 12/16/13 inferolat STEMI s/p BMS to RPL. b. Cath 08/2014: interim occlusion of the PLA stent of the RCA, collateralized from the left coronary artery with diffuse nonobstructive CAD;  c. STEMI/PCI: LM nl, LAD nl, LCX 18md (2.75x12 synergy DES), RCA 30p, RPDA 75ost/p, RPAV 100 w/ L->R collats.   Chronic back pain    GERD (gastroesophageal reflux disease)    HLD (hyperlipidemia)    Hypertension    Ischemic cardiomyopathy    a. 12/2013: EF 50-55% by echo with +WMA. b. EF 45% by cath in 08/2014.  PAD (peripheral artery disease) (HCC)    s/p drug-coated balloon angioplasty right SFA, angioplasty/DES right EIA 09/09/21   Peptic ulcer disease    Schizophrenic disorder ([place])    Tobacco abuse     Family History  Problem Relation Age of Onset   Heart attack Father 7   Heart attack Brother 70    Past Surgical History:  Procedure Laterality Date   ABDOMINAL AORTOGRAM W/LOWER EXTREMITY Bilateral 09/09/2021   Procedure: ABDOMINAL AORTOGRAM W/LOWER EXTREMITY;  Surgeon: Angelia Mould, MD;  Location: [place] [facility];  Service: Cardiovascular;  Laterality: Bilateral;   CARDIAC CATHETERIZATION N/A  08/07/2014   Procedure: Left Heart Cath and Coronary Angiography;  Surgeon: Sherren Mocha, MD;  Location: [place] [facility];  Service: Cardiovascular;  Laterality: N/A;   COLONOSCOPY N/A 06/06/2017   Procedure: COLONOSCOPY;  Surgeon: Rogene [place], MD;  [facility];  Service: Endoscopy;  Laterality: N/A;  10:50   CORONARY STENT INTERVENTION N/A 05/19/2016   Procedure: Coronary Stent Intervention;  Surgeon: Lorretta Harp, MD;  Location: [place] [facility] [facility];  Service: Cardiovascular;  Laterality: N/A;   CORONARY STENT INTERVENTION N/A 07/02/2018   Procedure: CORONARY STENT INTERVENTION;  Surgeon: Belva Crome, MD;  Location: [facility];  Service: Cardiovascular;  Laterality: N/A;   HERNIA REPAIR     LEFT HEART CATH AND CORONARY ANGIOGRAPHY N/A 05/19/2016   Procedure: Left Heart Cath and Coronary Angiography;  Surgeon: Lorretta Harp, MD;  Location: [facility];  Service: Cardiovascular;  Laterality: N/A;   LEFT HEART CATH AND CORONARY ANGIOGRAPHY N/A 07/02/2018   Procedure: LEFT HEART CATH AND CORONARY ANGIOGRAPHY;  Surgeon: Belva Crome, MD;  Location: [place];  Service: Cardiovascular;  Laterality: N/A;   LEFT HEART CATH AND CORONARY ANGIOGRAPHY N/A 12/21/2021   Procedure: LEFT HEART CATH AND CORONARY ANGIOGRAPHY;  Surgeon: Leonie Man, MD;  Location: [place];  Service: Cardiovascular;  Laterality: N/A;   LEFT HEART CATHETERIZATION WITH CORONARY ANGIOGRAM N/A 12/16/2013   Procedure: LEFT HEART CATHETERIZATION WITH CORONARY ANGIOGRAM;  Surgeon: Jettie Booze, MD;  [facility];  Service: Cardiovascular;  Laterality: N/A;   PERCUTANEOUS CORONARY STENT INTERVENTION (PCI-S)  12/16/2013   Procedure: PERCUTANEOUS CORONARY STENT INTERVENTION (PCI-S);  Surgeon: Jettie Booze, MD;  Location: [facility];  Service: Cardiovascular;;  distal rca   PERIPHERAL VASCULAR BALLOON ANGIOPLASTY  09/09/2021   Procedure: PERIPHERAL  VASCULAR BALLOON ANGIOPLASTY;  Surgeon: Angelia Mould, MD;  Location: [place] [facility];  Service: Cardiovascular;;  Rt SFA   PERIPHERAL VASCULAR INTERVENTION  09/09/2021   Procedure: PERIPHERAL VASCULAR INTERVENTION;  Surgeon: Angelia Mould, MD;  Location: [place] [facility];  Service: Cardiovascular;;  Rt Iliac   TONSILLECTOMY     TOTAL HIP ARTHROPLASTY Right 03/15/2022   Procedure: RIGHT TOTAL HIP ARTHROPLASTY ANTERIOR APPROACH;  Surgeon: [name], MD;  Location: [place];  Service: Orthopedics;  Laterality: Right;  Needs RNFA   Social History   Occupational History   Not on file  Tobacco Use   Smoking status: Every Day    Packs/day: 1.00    Years: 25.00    Total pack years: 25.00    Types: Cigarettes, Cigars    Start date: 05/25/1988   Smokeless tobacco: Former    Types: Snuff    Quit date: 05/21/1988  Vaping Use   Vaping Use: Never used  Substance and Sexual Activity   Alcohol  use: No    Alcohol/week: 0.0 standard drinks of alcohol   Drug use: Yes    Types: Marijuana    Comment: last used today   Sexual activity: Not on file

## 2022-04-05 ENCOUNTER — Encounter (HOSPITAL_COMMUNITY): Payer: Self-pay | Admitting: Student in an Organized Health Care Education/Training Program

## 2022-04-05 ENCOUNTER — Ambulatory Visit (HOSPITAL_BASED_OUTPATIENT_CLINIC_OR_DEPARTMENT_OTHER)
Payer: No Typology Code available for payment source | Admitting: Student in an Organized Health Care Education/Training Program

## 2022-04-05 VITALS — BP 122/87 | HR 88 | Ht 67.0 in | Wt 172.4 lb

## 2022-04-05 DIAGNOSIS — F411 Generalized anxiety disorder: Secondary | ICD-10-CM

## 2022-04-05 DIAGNOSIS — F339 Major depressive disorder, recurrent, unspecified: Secondary | ICD-10-CM | POA: Diagnosis not present

## 2022-04-05 DIAGNOSIS — F4310 Post-traumatic stress disorder, unspecified: Secondary | ICD-10-CM | POA: Diagnosis not present

## 2022-04-05 DIAGNOSIS — G4700 Insomnia, unspecified: Secondary | ICD-10-CM

## 2022-04-05 MED ORDER — SERTRALINE HCL 25 MG PO TABS: 25.0000 mg | ORAL_TABLET | Freq: Every day | ORAL | 1 refills | Status: DC

## 2022-04-05 MED ORDER — MIRTAZAPINE 30 MG PO TABS: 30.0000 mg | ORAL_TABLET | Freq: Every day | ORAL | 1 refills | Status: DC

## 2022-04-05 MED ORDER — OLANZAPINE 5 MG PO TABS: 5.0000 mg | ORAL_TABLET | Freq: Every day | ORAL | 1 refills | Status: DC

## 2022-04-05 MED ORDER — HYDROXYZINE HCL 25 MG PO TABS: 25.0000 mg | ORAL_TABLET | Freq: Three times a day (TID) | ORAL | 0 refills | Status: DC | PRN

## 2022-04-05 NOTE — Progress Notes (Signed)
BH MD/PA/NP OP Progress Note  04/05/2022 2:07 PM Don Fletcher  MRN:  [id number]  Chief Complaint:  Chief Complaint  Patient presents with   Follow-up   Depression   Anxiety   Trauma   HPI:  Don Fletcher is a 52 yr old male who presents for Follow Up and Medication Management.  PPHx is significant for Depression, Anxiety, and Schizophrenia, multiple Suicide Attempts (Hanging, OD, put gun in his mouth, last ~10 yrs ago), and multiple Hospitalizations ([facility] in [place], last ~10 yrs ago), and no history of Self Injurious Behavior.  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    He reports that he is doing okay today.  He reports only minimal improvement with the increase in Remeron made at his last appointment.  He reports he was unable to discuss with his PCP about starting prazosin because he had to schedule hip replacement surgery and it ended up being scheduled on that same day.  He does report that he will be seeing his PCP tomorrow and will discuss at that time.  He reports he will call back with his PCP's recommendation.  He reports his sleep continues to be poor because he continues to have nightmares.  He reports he continues to be irritable because his nerves are always shot.  Discussed with him that while we await starting prazosin we can start Zoloft at this time.  Discussed that it is the antidepressant with the best data of treating PTSD and that it does work well in combination with Remeron and Zyprexa.  Discussed potential risks and side effects and he was agreeable to a trial.  He reports no SI, HI, or AVH.  He reports his sleep is poor.  He reports his appetite has improved to where he consistently eats 1 maybe 2 meals a day.  He reports chronic headaches and hip pain.  He reports no other concerns at present.  He will return for follow-up in approximately 6 weeks.   Visit Diagnosis:    ICD-10-CM    1. Episode of recurrent major depressive disorder, unspecified depression episode severity (HCC)  F33.9 mirtazapine (REMERON) 30 MG tablet    OLANZapine (ZYPREXA) 5 MG tablet    sertraline (ZOLOFT) 25 MG tablet    2. GAD (generalized anxiety disorder)  F41.1 mirtazapine (REMERON) 30 MG tablet    hydrOXYzine (ATARAX) 25 MG tablet    OLANZapine (ZYPREXA) 5 MG tablet    sertraline (ZOLOFT) 25 MG tablet    3. Insomnia, unspecified type  G47.00 mirtazapine (REMERON) 30 MG tablet    4. PTSD (post-traumatic stress disorder)  F43.10 hydrOXYzine (ATARAX) 25 MG tablet    OLANZapine (ZYPREXA) 5 MG tablet    sertraline (ZOLOFT) 25 MG tablet      Past Psychiatric History: Depression, Anxiety, and Schizophrenia, multiple Suicide Attempts (Hanging, OD, put gun in his mouth, last ~10 yrs ago), and multiple Hospitalizations ([place], [facility] in [place], last ~10 yrs ago), and no history of Self Injurious Behavior.  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      Past Medical History:  Past Medical History:  Diagnosis Date   Arthritis    Bipolar 1 disorder ([place])    CAD (coronary artery disease)    a. 12/16/13 inferolat STEMI s/p BMS to RPL. b. Cath 08/2014: interim occlusion of  the PLA stent of the RCA, collateralized from the left coronary artery with diffuse nonobstructive CAD;  c. STEMI/PCI: LM nl, LAD nl, LCX 161md (2.75x12 synergy DES), RCA 30p, RPDA 75ost/p, RPAV 100 w/ L->R collats.   Chronic back pain    GERD (gastroesophageal reflux disease)    HLD (hyperlipidemia)    Hypertension    Ischemic cardiomyopathy    a. 12/2013: EF 50-55% by echo with +WMA. b. EF 45% by cath in 08/2014.   PAD (peripheral artery disease) (HCC)    s/p drug-coated balloon angioplasty right SFA, angioplasty/DES right EIA 09/09/21   Peptic ulcer disease    [place]    Tobacco abuse     Past Surgical History:  Procedure Laterality  Date   ABDOMINAL AORTOGRAM W/LOWER EXTREMITY Bilateral 09/09/2021   Procedure: ABDOMINAL AORTOGRAM W/LOWER EXTREMITY;  Surgeon: DAngelia Mould MD;  [facility];  Service: Cardiovascular;  Laterality: Bilateral;   CARDIAC CATHETERIZATION N/A 08/07/2014   Procedure: Left Heart Cath and Coronary Angiography;  Surgeon: MSherren Mocha MD;  [facility];  Service: Cardiovascular;  Laterality: N/A;   COLONOSCOPY N/A 06/06/2017   Procedure: COLONOSCOPY;  Surgeon: RRogene Houston MD;  [facility];  Service: Endoscopy;  Laterality: N/A;  10:50   CORONARY STENT INTERVENTION N/A 05/19/2016   Procedure: Coronary Stent Intervention;  Surgeon: JLorretta Harp MD;  [facility];  Service: Cardiovascular;  Laterality: N/A;   CORONARY STENT INTERVENTION N/A 07/02/2018   Procedure: CORONARY STENT INTERVENTION;  Surgeon: SBelva Crome MD;  [facility];  Service: Cardiovascular;  Laterality: N/A;   HERNIA REPAIR     LEFT HEART CATH AND CORONARY ANGIOGRAPHY N/A 05/19/2016   Procedure: Left Heart Cath and Coronary Angiography;  Surgeon: JLorretta Harp MD;  [facility];  Service: Cardiovascular;  Laterality: N/A;   LEFT HEART CATH AND CORONARY ANGIOGRAPHY N/A 07/02/2018   Procedure: LEFT HEART CATH AND CORONARY ANGIOGRAPHY;  Surgeon: SBelva Crome MD;  [facility];  Service: Cardiovascular;  Laterality: N/A;   LEFT HEART CATH AND CORONARY ANGIOGRAPHY N/A 12/21/2021   Procedure: LEFT HEART CATH AND CORONARY ANGIOGRAPHY;  Surgeon: HLeonie Man MD;  [place];  Service: Cardiovascular;  Laterality: N/A;   LEFT HEART CATHETERIZATION WITH CORONARY ANGIOGRAM N/A 12/16/2013   Procedure: LEFT HEART CATHETERIZATION WITH CORONARY ANGIOGRAM;  Surgeon: JJettie Booze MD;  [facility];  Service: Cardiovascular;  Laterality: N/A;   PERCUTANEOUS CORONARY STENT  INTERVENTION (PCI-S)  12/16/2013   Procedure: PERCUTANEOUS CORONARY STENT INTERVENTION (PCI-S);  Surgeon: JJettie Booze MD;  [facility];  Service: Cardiovascular;;  distal rca   PERIPHERAL VASCULAR BALLOON ANGIOPLASTY  09/09/2021   Procedure: PERIPHERAL VASCULAR BALLOON ANGIOPLASTY;  Surgeon: DAngelia Mould MD;  [facility];  Service: Cardiovascular;;  Rt SFA   PERIPHERAL VASCULAR INTERVENTION  09/09/2021   Procedure: PERIPHERAL VASCULAR INTERVENTION;  Surgeon: DAngelia Mould MD;  [place] [place];  Service: Cardiovascular;;  Rt Iliac   TONSILLECTOMY     TOTAL HIP ARTHROPLASTY Right 03/15/2022   Procedure: RIGHT TOTAL HIP ARTHROPLASTY ANTERIOR APPROACH;  Surgeon: YMarybelle Killings MD;  [place]  Service: Orthopedics;  Laterality: Right;  Needs RNFA    Family Psychiatric History: Sister- Bipolar Disorder Multiple Maternal Aunts- Schizophrenia  Family History:  Family History  Problem Relation Age of  Onset   Heart attack Father 86   Heart attack Brother 3    Social History:  Social History   Socioeconomic History   Marital status: Married    Spouse name: Not on file   Number of children: Not on file   Years of education: Not on file   Highest education level: Not on file  Occupational History   Not on file  Tobacco Use   Smoking status: Every Day    Packs/day: 1.00    Years: 25.00    Total pack years: 25.00    Types: Cigarettes, Cigars    Start date: 05/25/1988   Smokeless tobacco: Former    Types: Snuff    Quit date: 05/21/1988  Vaping Use   Vaping Use: Never used  Substance and Sexual Activity   Alcohol use: No    Alcohol/week: 0.0 standard drinks of alcohol   Drug use: Yes    Types: Marijuana    Comment: last used today   Sexual activity: Not on file  Other Topics Concern   Not on file  Social History Narrative   Not on file   Social Determinants of Health   Financial Resource Strain: Not on  file  Food Insecurity: No Food Insecurity (03/15/2022)   Hunger Vital Sign    Worried About Running Out of Food in the Last Year: Never true    Ran Out of Food in the Last Year: Never true  Transportation Needs: No Transportation Needs (03/15/2022)   PRAPARE - Hydrologist (Medical): No    Lack of Transportation (Non-Medical): No  Physical Activity: Inactive (06/30/2018)   Exercise Vital Sign    Days of Exercise per Week: 0 days    Minutes of Exercise per Session: 0 min  Stress: Stress Concern Present (06/30/2018)   [place]    Feeling of Stress : Rather much  Social Connections: Somewhat Isolated (06/30/2018)   Social Connection and Isolation Panel [NHANES]    Frequency of Communication with Friends and Family: More than three times a week    Frequency of Social Gatherings with Friends and Family: Never    Attends Religious Services: Never    Marine scientist or Organizations: No    Attends Archivist Meetings: Never    Marital Status: Married    Allergies:  Allergies  Allergen Reactions   Suboxone [Buprenorphine Hcl-Naloxone Hcl] Other (See Comments)    Caused kidney failure   Benadryl [Diphenhydramine] Other (See Comments)    LEG CRAMPING   Seroquel [Quetiapine] Other (See Comments)    "makes my legs cramp"    Metabolic Disorder Labs: Lab Results  Component Value Date   HGBA1C 5.6 09/29/2021   MPG 108 05/19/2016   MPG 111 05/19/2016   No results found for: "PROLACTIN" Lab Results  Component Value Date   CHOL 219 (H) 09/10/2021   TRIG 128 09/10/2021   HDL 25 (L) 09/10/2021   CHOLHDL 8.8 09/10/2021   VLDL 26 09/10/2021   LDLCALC 168 (H) 09/10/2021   LDLCALC 210 (H) 07/01/2018   Lab Results  Component Value Date   TSH 1.310 09/29/2021   TSH 2.779 05/19/2016    Therapeutic Level Labs: No results found for: "LITHIUM" Lab Results  Component Value Date    VALPROATE 73.0 07/14/2009   No results found for: "CBMZ"  Current Medications: Current Outpatient Medications  Medication Sig Dispense Refill   OLANZapine (ZYPREXA) 5  MG tablet Take 1 tablet (5 mg total) by mouth at bedtime. 30 tablet 1   sertraline (ZOLOFT) 25 MG tablet Take 1 tablet (25 mg total) by mouth daily. 30 tablet 1   aspirin EC 325 MG tablet Take 1 tablet (325 mg total) by mouth daily with breakfast.     benazepril (LOTENSIN) 40 MG tablet Take 1 tablet (40 mg total) by mouth daily. 90 tablet 3   carvedilol (COREG) 6.25 MG tablet Take 1 tablet (6.25 mg total) by mouth 2 (two) times daily with a meal. 60 tablet 3   clopidogrel (PLAVIX) 75 MG tablet Take 1 tablet (75 mg total) by mouth daily. 30 tablet 11   ezetimibe (ZETIA) 10 MG tablet Take 1 tablet (10 mg total) by mouth daily. 30 tablet 11   HYDROcodone-acetaminophen (NORCO/VICODIN) 5-325 MG tablet Take 1-2 tablets by mouth every 6 (six) hours as needed for moderate pain. 30 tablet 0   hydrOXYzine (ATARAX) 25 MG tablet Take 1 tablet (25 mg total) by mouth 3 (three) times daily as needed. 45 tablet 0   methocarbamol (ROBAXIN) 500 MG tablet Take 1 tablet (500 mg total) by mouth every 6 (six) hours as needed for muscle spasms. 40 tablet 0   mirtazapine (REMERON) 30 MG tablet Take 1 tablet (30 mg total) by mouth at bedtime. 30 tablet 1   pantoprazole (PROTONIX) 20 MG tablet Take 1 tablet (20 mg total) by mouth daily. 90 tablet 1   No current facility-administered medications for this visit.     Musculoskeletal: Strength & Muscle Tone: within normal limits Gait & Station:  with a limp Patient leans: Right  Psychiatric Specialty Exam: Review of Systems  Respiratory:  Negative for cough and shortness of breath.   Cardiovascular:  Negative for chest pain.  Gastrointestinal:  Negative for abdominal pain, constipation, diarrhea, nausea and vomiting.  Musculoskeletal:        Right hip pain  Neurological:  Positive for  headaches (chronic). Negative for dizziness and weakness.  Psychiatric/Behavioral:  Positive for dysphoric mood and sleep disturbance. Negative for hallucinations and suicidal ideas. The patient is nervous/anxious.     Blood pressure 122/87, pulse 88, height '5\' 7"'$  (1.702 m), weight 172 lb 6.4 oz (78.2 kg), SpO2 95 %.Body mass index is 27 kg/m.  General Appearance: Casual and Fairly Groomed  Eye Contact:  Good  Speech:  Clear and Coherent and Normal Rate  Volume:  Normal  Mood:  Anxious and Dysphoric  Affect:  Congruent  Thought Process:  Coherent and Goal Directed  Orientation:  Full (Time, Place, and Person)  Thought Content: WDL and Logical   Suicidal Thoughts:  No  Homicidal Thoughts:  No  Memory:  Immediate;   Good Recent;   Good  Judgement:  Good  Insight:  Good  Psychomotor Activity:  Normal  Concentration:  Concentration: Good and Attention Span: Good  Recall:  Good  Fund of Knowledge: Good  Language: Good  Akathisia:  Negative  Handed:  Right  AIMS (if indicated): not done  Assets:  Communication Skills Desire for Improvement Housing Resilience Social Support  ADL's:  Intact  Cognition: WNL  Sleep:  Poor   Screenings: PHQ2-9    [place] Office Visit from 12/12/2021 in [facility] Office Visit from 11/14/2021 in [facility] Office Visit from 09/29/2021 in [facility]  PHQ-2 Total Score '6 6 6  '$ PHQ-9 Total Score 18 21 22  Flowsheet Row Admission (Discharged) from 03/15/2022 in [place] [facility] from 03/09/2022 in [facility] PREADMISSION TESTING Office Visit from 09/29/2021 in [facility]  C-SSRS RISK CATEGORY No Risk No Risk No Risk        Assessment and Plan:  Don Fletcher is a 52 yr old male who presents for Follow Up and Medication Management.  PPHx is significant for  Depression, Anxiety, and Schizophrenia, multiple Suicide Attempts (Hanging, OD, put gun in his mouth, last ~10 yrs ago), and multiple Hospitalizations ([facility] in [place], last ~10 yrs ago), and no history of Self Injurious Behavior.  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    Jeylan continues to have significant struggles with his PTSD symptoms.  At this time we will start Zoloft to address this.  He was unable to discuss with his PCP, Dr. Doren Custard, about starting prazosin so we will not start it at this time.  He does have an appointment with him tomorrow and so will discuss prazosin and him starting it and if any changes to his blood pressure medications need to be made.  He will call the office back and as long as Dr. Doren Custard is agreeable with that we will start prazosin at that time.  We will not make any other medication changes at this time.  He will return for follow-up in approximately 6 weeks.  (It appears that during his recent hospitalization and surgery his Zyprexa was removed from his med list and the Risperdal was relisted.  He has continued to take the Zyprexa and so this was restarted and the Risperdal was discontinued.)   Schizophrenia  GAD  PTSD  MDD: -Continue Zyprexa 5 mg for psychosis, anger issues, and augmentation.  60 tablets with 1 refill. -Continue Remeron 30 mg QHS for depression, anxiety, insomnia, and appetite.  30 tablets with 1 refill. -Start Zoloft 25 mg daily for depression and anxiety.  30 tablets with 1 refill. -Continue Hydroxyzine 25 mg TID PRN.  45 tablets with 0 refills. -Will discuss with PCP about Prazosin 1 mg QHS.   Collaboration of Care:   Patient/Guardian was advised Release of Information must be obtained prior to any record release in order to collaborate their care with an outside provider. Patient/Guardian was advised if they have not already done so to contact the  registration department to sign all necessary forms in order for [place] to release information regarding their care.   Consent: Patient/Guardian gives verbal consent for treatment and assignment of benefits for services provided during this visit. Patient/Guardian expressed understanding and agreed to proceed.    Briant Cedar, MD 04/05/2022, 2:07 PM

## 2022-04-06 ENCOUNTER — Ambulatory Visit (INDEPENDENT_AMBULATORY_CARE_PROVIDER_SITE_OTHER): Payer: Medicaid Other | Admitting: Internal Medicine

## 2022-04-06 ENCOUNTER — Encounter: Payer: Self-pay | Admitting: Internal Medicine

## 2022-04-06 VITALS — BP 110/73 | HR 82 | Ht 68.0 in | Wt 174.0 lb

## 2022-04-06 DIAGNOSIS — M1611 Unilateral primary osteoarthritis, right hip: Secondary | ICD-10-CM

## 2022-04-06 DIAGNOSIS — I739 Peripheral vascular disease, unspecified: Secondary | ICD-10-CM

## 2022-04-06 DIAGNOSIS — Z720 Tobacco use: Secondary | ICD-10-CM

## 2022-04-06 DIAGNOSIS — I10 Essential (primary) hypertension: Secondary | ICD-10-CM | POA: Diagnosis not present

## 2022-04-06 DIAGNOSIS — I2510 Atherosclerotic heart disease of native coronary artery without angina pectoris: Secondary | ICD-10-CM

## 2022-04-06 DIAGNOSIS — Z2821 Immunization not carried out because of patient refusal: Secondary | ICD-10-CM | POA: Diagnosis not present

## 2022-04-06 DIAGNOSIS — E785 Hyperlipidemia, unspecified: Secondary | ICD-10-CM

## 2022-04-06 DIAGNOSIS — G4700 Insomnia, unspecified: Secondary | ICD-10-CM

## 2022-04-06 DIAGNOSIS — Z9861 Coronary angioplasty status: Secondary | ICD-10-CM

## 2022-04-06 DIAGNOSIS — R911 Solitary pulmonary nodule: Secondary | ICD-10-CM

## 2022-04-06 DIAGNOSIS — F209 Schizophrenia, unspecified: Secondary | ICD-10-CM

## 2022-04-06 NOTE — Progress Notes (Signed)
Established Patient Office Visit  Subjective   Patient ID: Don Fletcher, male    DOB: 30-Mar-1970  Age: 52 y.o. MRN: [id number]  Chief Complaint  Patient presents with   Hypertension    Follow up   Hyperlipidemia    Follow up   Don Fletcher returns to care today for follow-up.  He was last seen by me on 12/16/21.  No medication changes were made at that time.  In the interim he has undergone LHC in the setting of a nuclear stress test demonstrating peri-infarct ischemia in the RCA distribution.  This was completed on 12/21/21.  No PCI was performed.  Coronary arteries appeared angiographically stable with widely patent stents.  Medical management recommended.  He then underwent right hip THA with Dr. Lorin Mercy earlier this month (2/7).  His postoperative course has been uncomplicated.  Don Fletcher has also been seen by psychiatry for follow-up.  Today he reports that psychiatry would like to start him on prazosin 1 mg nightly, but would like my recommendations prior to initiation.  Don Fletcher reports feeling well today.  He is asymptomatic and has no additional concerns to discuss.  Past Medical History:  Diagnosis Date   Arthritis    Bipolar 1 disorder ([place])    CAD (coronary artery disease)    a. 12/16/13 inferolat STEMI s/p BMS to RPL. b. Cath 08/2014: interim occlusion of the PLA stent of the RCA, collateralized from the left coronary artery with diffuse nonobstructive CAD;  c. STEMI/PCI: LM nl, LAD nl, LCX 173md (2.75x12 synergy DES), RCA 30p, RPDA 75ost/p, RPAV 100 w/ L->R collats.   Chronic back pain    GERD (gastroesophageal reflux disease)    HLD (hyperlipidemia)    Hypertension    Ischemic cardiomyopathy    a. 12/2013: EF 50-55% by echo with +WMA. b. EF 45% by cath in 08/2014.   PAD (peripheral artery disease) (HCC)    s/p drug-coated balloon angioplasty right SFA, angioplasty/DES right EIA 09/09/21   Peptic ulcer disease    [place]ic disorder [place]    Tobacco abuse    Past  Surgical History:  Procedure Laterality Date   ABDOMINAL AORTOGRAM W/LOWER EXTREMITY Bilateral 09/09/2021   Procedure: ABDOMINAL AORTOGRAM W/LOWER EXTREMITY;  Surgeon: DAngelia Mould MD;  [facility];  Service: Cardiovascular;  Laterality: Bilateral;   CARDIAC CATHETERIZATION N/A 08/07/2014   Procedure: Left Heart Cath and Coronary Angiography;  Surgeon: MSherren Mocha MD;  [facility];  Service: Cardiovascular;  Laterality: N/A;   COLONOSCOPY N/A 06/06/2017   Procedure: COLONOSCOPY;  Surgeon: RRogene Houston MD;  [facility];  Service: Endoscopy;  Laterality: N/A;  10:50   CORONARY STENT INTERVENTION N/A 05/19/2016   Procedure: Coronary Stent Intervention;  Surgeon: JLorretta Harp MD;  [facility];  Service: Cardiovascular;  Laterality: N/A;   CORONARY STENT INTERVENTION N/A 07/02/2018   Procedure: CORONARY STENT INTERVENTION;  Surgeon: SBelva Crome MD;  [facility];  Service: Cardiovascular;  Laterality: N/A;   HERNIA REPAIR     LEFT HEART CATH AND CORONARY ANGIOGRAPHY N/A 05/19/2016   Procedure: Left Heart Cath and Coronary Angiography;  Surgeon: JLorretta Harp MD;  [facility];  Service: Cardiovascular;  Laterality: N/A;   LEFT HEART CATH AND CORONARY ANGIOGRAPHY N/A 07/02/2018   Procedure: LEFT HEART CATH AND CORONARY ANGIOGRAPHY;  Surgeon: SBelva Crome MD;  [facility];  Service: Cardiovascular;  Laterality: N/A;   LEFT HEART CATH AND CORONARY ANGIOGRAPHY N/A 12/21/2021   Procedure: LEFT HEART CATH AND CORONARY ANGIOGRAPHY;  Surgeon: Leonie Man, MD;  Location: [place] [facility];  Service: Cardiovascular;  Laterality: N/A;   LEFT HEART CATHETERIZATION WITH CORONARY ANGIOGRAM N/A 12/16/2013   Procedure: LEFT HEART CATHETERIZATION WITH CORONARY ANGIOGRAM;  Surgeon: Jettie Booze, MD;  Location: [facility] CATH LAB;  Service: Cardiovascular;  Laterality:  N/A;   PERCUTANEOUS CORONARY STENT INTERVENTION (PCI-S)  12/16/2013   Procedure: PERCUTANEOUS CORONARY STENT INTERVENTION (PCI-S);  Surgeon: Jettie Booze, MD;  Location: [facility] CATH LAB;  Service: Cardiovascular;;  distal rca   PERIPHERAL VASCULAR BALLOON ANGIOPLASTY  09/09/2021   Procedure: PERIPHERAL VASCULAR BALLOON ANGIOPLASTY;  Surgeon: Angelia Mould, MD;  Location: [place] [facility];  Service: Cardiovascular;;  Rt SFA   PERIPHERAL VASCULAR INTERVENTION  09/09/2021   Procedure: PERIPHERAL VASCULAR INTERVENTION;  Surgeon: Angelia Mould, MD;  Location: [place] CV LAB;  Service: Cardiovascular;;  Rt Iliac   TONSILLECTOMY     TOTAL HIP ARTHROPLASTY Right 03/15/2022   Procedure: RIGHT TOTAL HIP ARTHROPLASTY ANTERIOR APPROACH;  Surgeon: [name], MD;  Location: [place];  Service: Orthopedics;  Laterality: Right;  Needs RNFA   Social History   Tobacco Use   Smoking status: Every Day    Packs/day: 1.00    Years: 25.00    Total pack years: 25.00    Types: Cigarettes, Cigars    Start date: 05/25/1988   Smokeless tobacco: Former    Types: Snuff    Quit date: 05/21/1988  Vaping Use   Vaping Use: Never used  Substance Use Topics   Alcohol use: No    Alcohol/week: 0.0 standard drinks of alcohol   Drug use: Yes    Types: Marijuana    Comment: last used today   Family History  Problem Relation Age of Onset   Heart attack Father 16   Heart attack Brother 40   Allergies  Allergen Reactions   Suboxone [Buprenorphine Hcl-Naloxone Hcl] Other (See Comments)    Caused kidney failure   Benadryl [Diphenhydramine] Other (See Comments)    LEG CRAMPING   Seroquel [Quetiapine] Other (See Comments)    "makes my legs cramp"   Review of Systems  Constitutional:  Negative for chills and fever.  HENT:  Negative for sore throat.   Respiratory:  Negative for cough and shortness of breath.   Cardiovascular:  Negative for chest pain, palpitations and leg swelling.   Gastrointestinal:  Negative for abdominal pain, blood in stool, constipation, diarrhea, nausea and vomiting.  Genitourinary:  Negative for dysuria and hematuria.  Musculoskeletal:  Negative for myalgias.  Skin:  Negative for itching and rash.  Neurological:  Negative for dizziness and headaches.  Psychiatric/Behavioral:  Negative for depression and suicidal ideas.      Objective:     BP 110/73   Pulse 82   Ht '5\' 8"'$  (1.727 m)   Wt 174 lb (78.9 kg)   SpO2 96%   BMI 26.46 kg/m  BP Readings from Last 3 Encounters:  04/06/22 110/73  04/05/22 122/87  03/16/22 109/82   Physical Exam Vitals reviewed.  Constitutional:      General: He is not in acute distress.    Appearance: Normal appearance. He is not ill-appearing.  HENT:     Head: Normocephalic and atraumatic.     Right Ear: External ear normal.     Left Ear: External ear normal.  Nose: Nose normal. No congestion or rhinorrhea.     Mouth/Throat:     Mouth: Mucous membranes are moist.     Pharynx: Oropharynx is clear.  Eyes:     General: No scleral icterus.    Extraocular Movements: Extraocular movements intact.     Conjunctiva/sclera: Conjunctivae normal.     Pupils: Pupils are equal, round, and reactive to light.  Cardiovascular:     Rate and Rhythm: Normal rate and regular rhythm.     Pulses: Normal pulses.     Heart sounds: Murmur heard.  Pulmonary:     Effort: Pulmonary effort is normal.     Breath sounds: Normal breath sounds. No wheezing, rhonchi or rales.  Abdominal:     General: Abdomen is flat. Bowel sounds are normal. There is no distension.     Palpations: Abdomen is soft.     Tenderness: There is no abdominal tenderness.  Musculoskeletal:        General: No swelling or deformity. Normal range of motion.     Cervical back: Normal range of motion.  Skin:    General: Skin is warm and dry.     Capillary Refill: Capillary refill takes less than 2 seconds.     Comments: Surgical incision present on the  right hip.  No surrounding erythema or purulence appreciated.  Steri-Strips remain in place.  Neurological:     General: No focal deficit present.     Mental Status: He is alert and oriented to person, place, and time.     Motor: No weakness.  Psychiatric:        Mood and Affect: Mood normal.        Behavior: Behavior normal.        Thought Content: Thought content normal.   Last CBC Lab Results  Component Value Date   WBC 10.1 03/16/2022   HGB 14.6 03/16/2022   HCT 40.7 03/16/2022   MCV 90.2 03/16/2022   MCH 32.4 03/16/2022   RDW 13.0 03/16/2022   PLT 215 [id number]   Last metabolic panel Lab Results  Component Value Date   GLUCOSE 125 (H) 03/16/2022   NA 132 (L) 03/16/2022   K 4.1 03/16/2022   CL 100 03/16/2022   CO2 24 03/16/2022   BUN 14 03/16/2022   CREATININE 1.12 03/16/2022   GFRNONAA >60 03/16/2022   CALCIUM 8.4 (L) 03/16/2022   PROT 7.0 07/23/2020   ALBUMIN 3.8 07/23/2020   BILITOT 0.5 07/23/2020   ALKPHOS 81 07/23/2020   AST 24 07/23/2020   ALT 28 07/23/2020   ANIONGAP 8 03/16/2022   Last lipids Lab Results  Component Value Date   CHOL 219 (H) 09/10/2021   HDL 25 (L) 09/10/2021   LDLCALC 168 (H) 09/10/2021   TRIG 128 09/10/2021   CHOLHDL 8.8 09/10/2021   Last hemoglobin A1c Lab Results  Component Value Date   HGBA1C 5.6 09/29/2021   Last thyroid functions Lab Results  Component Value Date   TSH 1.310 09/29/2021   Last vitamin B12 and Folate Lab Results  Component Value Date   [id number] 09/29/2021   FOLATE 2.5 (L) 09/29/2021     Assessment & Plan:   Problem List Items Addressed This Visit       Hypertension (Chronic)    BP remains well-controlled on benazepril 40 mg daily and carvedilol 6.25 mg twice daily. -No medication changes today      Lung nodule seen on imaging study    Lung RADS 3 right lung  nodule identified on LDCT in August 2023.  Repeat scan recommended for 6 months.  This appointment was canceled due to recent  surgery.  -Provided with contact information to reschedule LDCT      Unilateral primary osteoarthritis, right hip    S/p right hip THA with Dr. Lorin Mercy on 2/7.  His postoperative course has been uncomplicated.  Aspirin has temporarily been increased to 325 mg daily x 4 weeks. -Ortho follow-up scheduled for 3/28.      HLD (hyperlipidemia) (Chronic)    Lipid panel last updated in August 2023.  Total cholesterol 219 and LDL 168.  He reports today that he has started Repatha.  He is also prescribed Zetia 10 mg daily. -No medication changes today      Insomnia    Continues to endorse insomnia.  He has discussed this with psychiatry and they are considering adding prazosin nightly, which seems like a reasonable option. -Okay to start prazosin 1 mg nightly      Return in about 6 months (around 10/05/2022).   Johnette Abraham, MD

## 2022-04-06 NOTE — Patient Instructions (Signed)
It was a pleasure to see you today.  Thank you for giving [place] the opportunity to be involved in your care.  Below is a brief recap of your visit and next steps.  We will plan to see you again in 6 months.  Summary No medication changes today Please see the contact information below for the cancer center in [facility] to schedule repeat lung CT scan Follow up in 6 months  [facility] [phone]

## 2022-04-11 NOTE — Assessment & Plan Note (Signed)
Lipid panel last updated in August 2023.  Total cholesterol 219 and LDL 168.  He reports today that he has started Repatha.  He is also prescribed Zetia 10 mg daily. -No medication changes today

## 2022-04-11 NOTE — Assessment & Plan Note (Signed)
Continues to endorse insomnia.  He has discussed this with psychiatry and they are considering adding prazosin nightly, which seems like a reasonable option. -Okay to start prazosin 1 mg nightly

## 2022-04-11 NOTE — Assessment & Plan Note (Signed)
BP remains well-controlled on benazepril 40 mg daily and carvedilol 6.25 mg twice daily. -No medication changes today

## 2022-04-11 NOTE — Assessment & Plan Note (Signed)
S/p right hip THA with Dr. Lorin Mercy on 2/7.  His postoperative course has been uncomplicated.  Aspirin has temporarily been increased to 325 mg daily x 4 weeks. -Ortho follow-up scheduled for 3/28.

## 2022-04-11 NOTE — Assessment & Plan Note (Signed)
Lung RADS 3 right lung nodule identified on LDCT in August 2023.  Repeat scan recommended for 6 months.  This appointment was canceled due to recent surgery.  -Provided with contact information to reschedule LDCT

## 2022-04-25 ENCOUNTER — Encounter (INDEPENDENT_AMBULATORY_CARE_PROVIDER_SITE_OTHER): Payer: Self-pay | Admitting: *Deleted

## 2022-05-04 ENCOUNTER — Encounter: Payer: Medicaid Other | Admitting: Orthopaedic Surgery

## 2022-05-12 ENCOUNTER — Other Ambulatory Visit: Payer: Self-pay | Admitting: Orthopaedic Surgery

## 2022-05-12 ENCOUNTER — Telehealth: Payer: Self-pay | Admitting: Radiology

## 2022-05-12 NOTE — Telephone Encounter (Signed)
noted 

## 2022-05-12 NOTE — Telephone Encounter (Signed)
Received fax request from pharmacy for refill of hydrocodone 5/325 1-2 po q 6 prn #30. Last filled 03/30/2022. Patient is status post right THA on 03/15/2022.  Please advise.  [facility]

## 2022-05-18 ENCOUNTER — Encounter (HOSPITAL_COMMUNITY)
Payer: No Typology Code available for payment source | Admitting: Student in an Organized Health Care Education/Training Program

## 2022-05-18 NOTE — Progress Notes (Signed)
This encounter was created in error - please disregard.

## 2022-05-30 ENCOUNTER — Telehealth (INDEPENDENT_AMBULATORY_CARE_PROVIDER_SITE_OTHER): Payer: Self-pay | Admitting: Gastroenterology

## 2022-05-30 NOTE — Telephone Encounter (Signed)
Who is your primary care physician: Don Fletcher  Reasons for the colonoscopy: recall  Have you had a colonoscopy before?  yes  Do you have family history of colon cancer? Yes brother  Previous colonoscopy with polyps removed? I dont know  Do you have a history colorectal cancer?   no  Are you diabetic? If yes, Type 1 or Type 2?    no  Do you have a prosthetic or mechanical heart valve? no  Do you have a pacemaker/defibrillator?   no  Have you had endocarditis/atrial fibrillation? no  Have you had joint replacement within the last 12 months?  Yes hip  Do you tend to be constipated or have to use laxatives? no  Do you have any history of drugs or alchohol?  no  Do you use supplemental oxygen?  no  Have you had a stroke or heart attack within the last 6 months?no  Do you take weight loss medication? No  Do you take any blood-thinning medications such as: (aspirin, warfarin, Plavix, Aggrenox)  yes  If yes we need the name, milligram, dosage and who is prescribing doctor Aspirin 81 mg Dr.Dixon OTC Current Outpatient Medications on File Prior to Visit  Medication Sig Dispense Refill   aspirin EC 325 MG tablet Take 1 tablet (325 mg total) by mouth daily with breakfast.     benazepril (LOTENSIN) 40 MG tablet Take 1 tablet (40 mg total) by mouth daily. 90 tablet 3   carvedilol (COREG) 6.25 MG tablet Take 1 tablet (6.25 mg total) by mouth 2 (two) times daily with a meal. 60 tablet 3   ezetimibe (ZETIA) 10 MG tablet Take 1 tablet (10 mg total) by mouth daily. 30 tablet 11   pantoprazole (PROTONIX) 20 MG tablet Take 1 tablet (20 mg total) by mouth daily. 90 tablet 1   No current facility-administered medications on file prior to visit.    Allergies  Allergen Reactions   Suboxone [Buprenorphine Hcl-Naloxone Hcl] Other (See Comments)    Caused kidney failure   Benadryl [Diphenhydramine] Other (See Comments)    LEG CRAMPING   Seroquel [Quetiapine] Other (See Comments)    "makes  my legs cramp"     Pharmacy: [facility]   Primary Insurance Name: Medicaid   Best number where you can be reached: [phone]

## 2022-05-30 NOTE — Telephone Encounter (Signed)
Room 3 , clearance to hold aspirin 325 mg (OK from our side to take 81 mg aspirin if needed) Thanks

## 2022-06-01 NOTE — Telephone Encounter (Signed)
Attempted to reach pt but call could not be completed at this time. (Per pt triage paper, he is on 81 mg ASA daily;need to clarify with pt)

## 2022-06-07 ENCOUNTER — Telehealth (INDEPENDENT_AMBULATORY_CARE_PROVIDER_SITE_OTHER): Payer: Self-pay | Admitting: Gastroenterology

## 2022-06-07 MED ORDER — PEG 3350-KCL-NA BICARB-NACL 420 G PO SOLR: 4000.0000 mL | Freq: Once | ORAL | 0 refills | Status: AC

## 2022-06-07 NOTE — Addendum Note (Signed)
Addended by: Marlowe Shores on: 06/07/2022 09:41 AM   Modules accepted: Orders

## 2022-06-07 NOTE — Telephone Encounter (Signed)
OK to hold ASA 325 mg for procedure. Should resume ASA at 81 mg daily, not 325 mg, after the procedure.   Instruction will be mailed to pt.

## 2022-06-07 NOTE — Telephone Encounter (Signed)
Pt wife contacted and scheduled colonoscopy for 07/28/22. Clearance sent to PCP in regards to Aspirin. Instructions will be sent once clearance is received. Prep sent to pharmacy.

## 2022-06-07 NOTE — Telephone Encounter (Signed)
Questionnaire from recall, no referral needed  

## 2022-06-07 NOTE — Telephone Encounter (Signed)
    06/07/22  Don Fletcher [birth year]  What type of surgery is being performed? Colonoscopy  When is surgery scheduled? 07/28/22  clearance to hold aspirin 325 mg (OK from our side to take 81 mg aspirin if needed)   Name of physician performing surgery?  [facility]. [facility] Phone: [phone] Fax: [phone]  Anethesia type (none, local, MAC, general)? MAC

## 2022-06-11 IMAGING — DX DG CHEST 2V
2 series · 2 of 2 positions shown · non-contrast
Comparison: May 17, 2020

CLINICAL DATA: Chest pain and shortness of breath

EXAM:
CHEST - 2 VIEW

[chest pa]
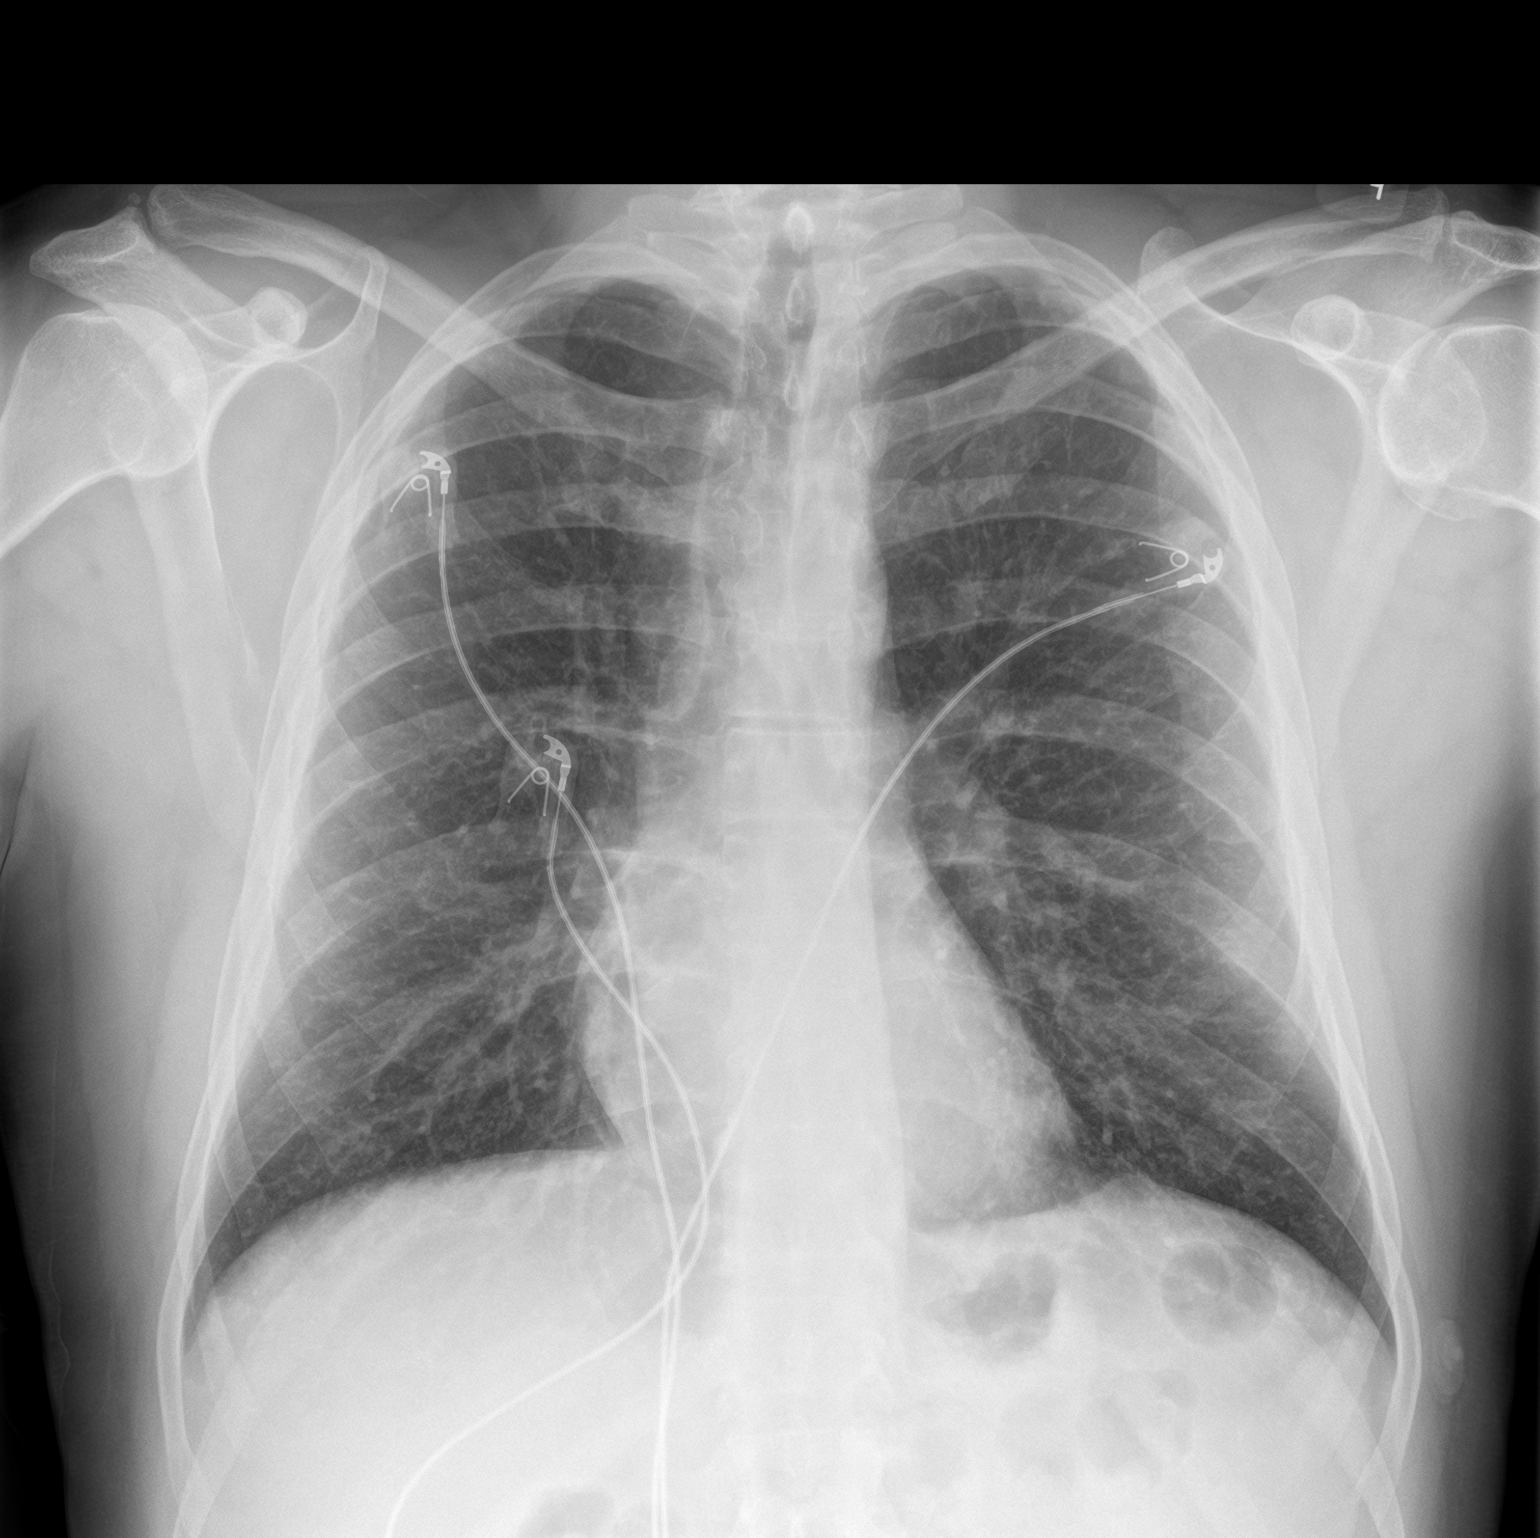

[chest lat]
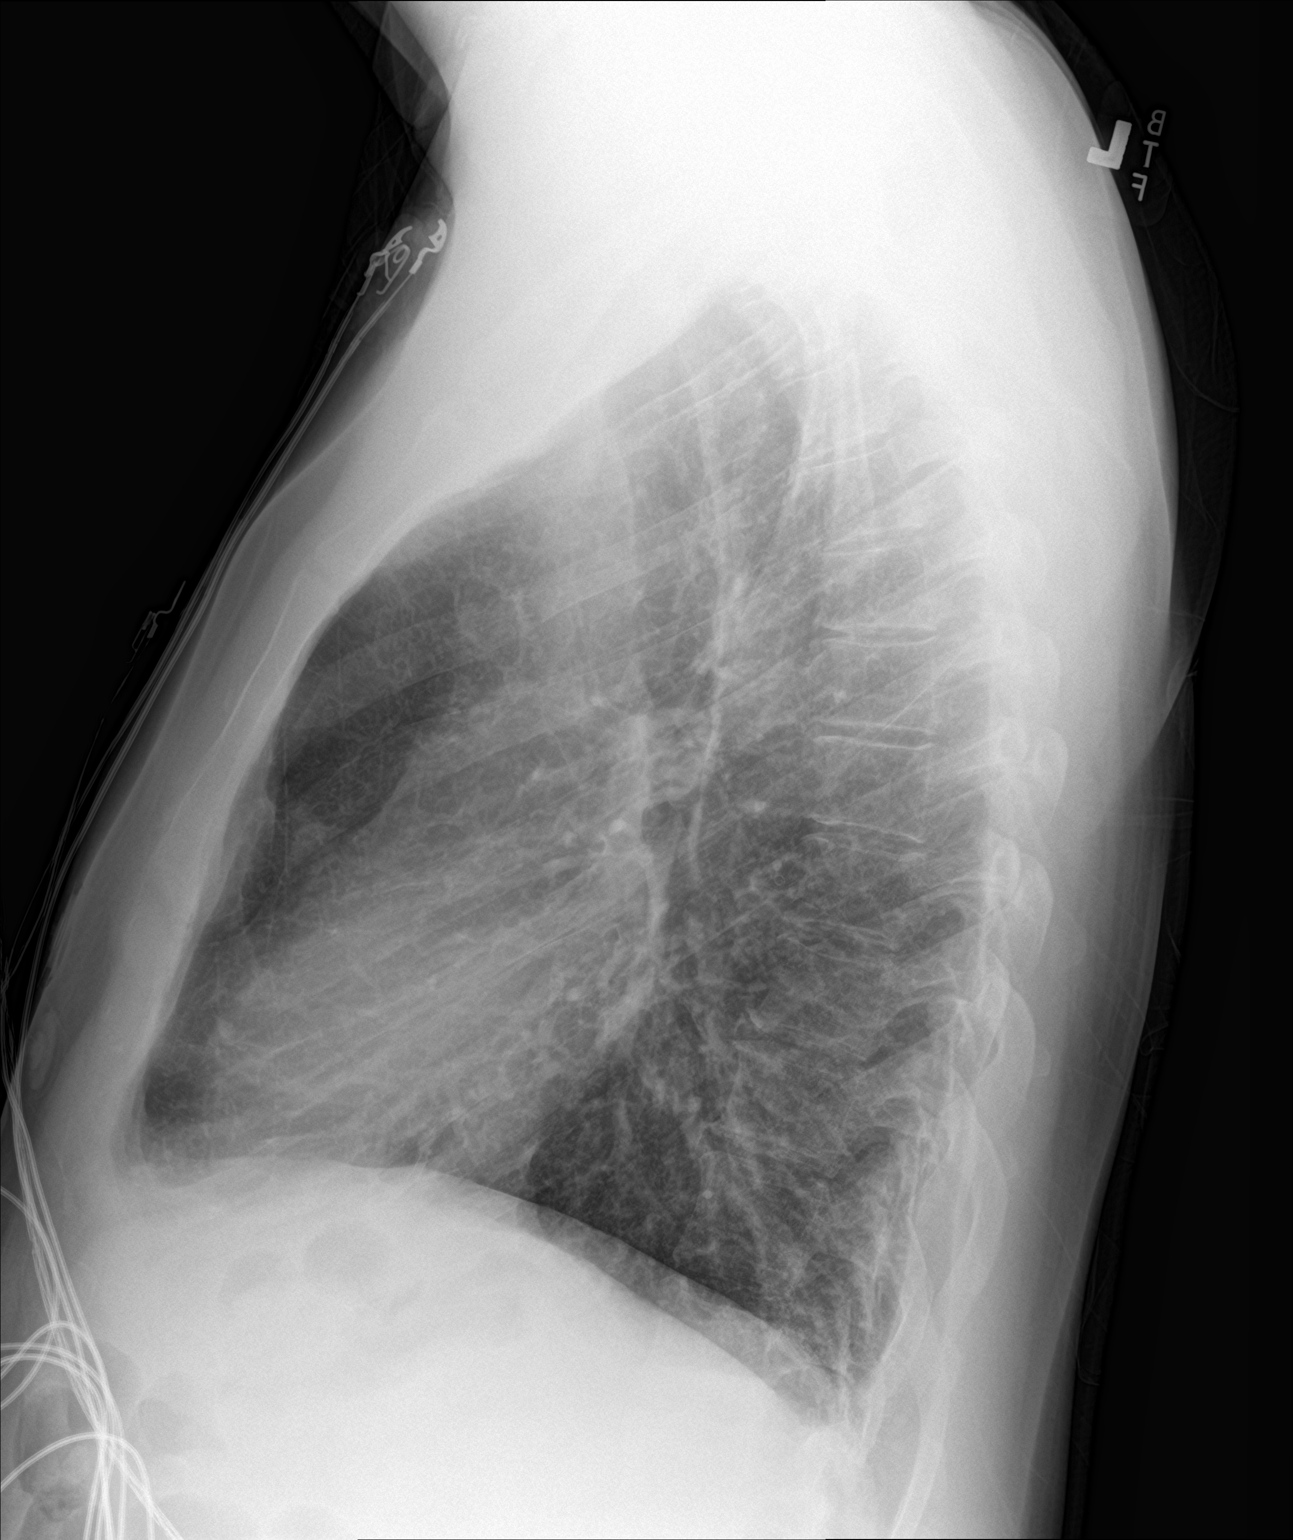

[2 of 2 positions shown; findings below may reference images not displayed]

FINDINGS: No edema or airspace opacity. Interstitium is mildly thickened.
Heart size and pulmonary vascularity are normal. No. No bone
lesions.
IMPRESSION: Interstitial thickening, likely representing a degree of chronic
bronchitis. No edema or airspace opacity. Cardiac silhouette normal.

## 2022-06-15 ENCOUNTER — Other Ambulatory Visit (INDEPENDENT_AMBULATORY_CARE_PROVIDER_SITE_OTHER): Payer: Medicaid Other

## 2022-06-15 ENCOUNTER — Ambulatory Visit (INDEPENDENT_AMBULATORY_CARE_PROVIDER_SITE_OTHER): Payer: Medicaid Other | Admitting: Orthopaedic Surgery

## 2022-06-15 ENCOUNTER — Encounter: Payer: Self-pay | Admitting: Orthopaedic Surgery

## 2022-06-15 VITALS — Ht 67.0 in | Wt 168.0 lb

## 2022-06-15 DIAGNOSIS — M25561 Pain in right knee: Secondary | ICD-10-CM

## 2022-06-15 DIAGNOSIS — M25552 Pain in left hip: Secondary | ICD-10-CM

## 2022-06-15 NOTE — Progress Notes (Signed)
Office Visit Note   Patient: Don Fletcher           Date of Birth: October 22, 1970           MRN: [id number] Visit Date: 06/15/2022              Requested by: Billie Lade, MD [address] [place],  [place] [address] PCP: Billie Lade, MD   Assessment & Plan: Visit Diagnoses:  1. Acute pain of right knee   2. Pain in left hip     Plan: We again discussed smoking cessation with his history of 3 artifacts.  He likely has some left hip inflammation with only mild OA changes on x-ray he is not better in a couple months he will need an MRI scan of his left hip he can use a cane in his right hand to help unload his left hip some degree.  Follow-Up Instructions: No follow-ups on file.   Orders:  Orders Placed This Encounter  Procedures   XR HIP UNILAT W OR W/O PELVIS 2-3 VIEWS LEFT   XR KNEE 3 VIEW RIGHT   No orders of the defined types were placed in this encounter.     Procedures: No procedures performed   Clinical Data: No additional findings.   Subjective: Chief Complaint  Patient presents with   Left Hip - Pain   Right Knee - Pain    HPI follow-up right total hip arthroplasty 03/15/2022.  Right hip doing well has had significant pain in his left hip anteriorly occasionally feels a catching.  He is also had some mild right medial knee pain without locking repetitively but once or twice he is felt like it catches.  He notices left groin pain when he rotates or tries to cross his leg to reach his ankle.  Review of Systems all systems update noncontributory.   Objective: Vital Signs: Ht 5\' 7"  (1.702 m)   Wt 168 lb (76.2 kg)   BMI 26.31 kg/m   Physical Exam Constitutional:      Appearance: He is well-developed.  HENT:     Head: Normocephalic and atraumatic.     Right Ear: External ear normal.     Left Ear: External ear normal.  Eyes:     Pupils: Pupils are equal, round, and reactive to light.  Neck:     Thyroid: No thyromegaly.     Trachea: No  tracheal deviation.  Cardiovascular:     Rate and Rhythm: Normal rate.  Pulmonary:     Effort: Pulmonary effort is normal.     Breath sounds: No wheezing.  Abdominal:     General: Bowel sounds are normal.     Palpations: Abdomen is soft.  Musculoskeletal:     Cervical back: Neck supple.  Skin:    General: Skin is warm and dry.     Capillary Refill: Capillary refill takes less than 2 seconds.  Neurological:     Mental Status: He is alert and oriented to person, place, and time.  Psychiatric:        Behavior: Behavior normal.        Thought Content: Thought content normal.        Judgment: Judgment normal.     Ortho Exam right hip arthroplasty incision well-healed soft and no pain with range of motion right hip.  Left hip he has sharp pain when she is and jumps with internal rotation at 20 to 30 degrees on the left  hip.  Some pain with external rotation.  Mild medial joint line pain right and left fairly symmetrical no knee effusion.  Specialty Comments:  No specialty comments available.  Imaging: XR HIP UNILAT W OR W/O PELVIS 2-3 VIEWS LEFT  Result Date: 06/15/2022 AP pelvis frog-leg left hip obtained and reviewed well-positioned right total of arthroplasty without loosening or subsidence.  Minimal osteophyte and lateral subchondral cyst left hip.  No joint space narrowing. Impression: Left hip minimal OA changes.  Well-positioned right total hip arthroplasty.  XR KNEE 3 VIEW RIGHT  Result Date: 06/15/2022 Standing AP both knees lateral right knee obtained and reviewed this shows some mild knee spurring minimal knee joint line narrowing medial compartment both knees.  Right patellofemoral joint appears normal. Impression: Mild knee medial joint line narrowing minimal spurring.    PMFS History: Patient Active Problem List   Diagnosis Date Noted   Status post total hip replacement, right 03/15/2022   Abnormal nuclear stress test 12/21/2021   Preop cardiovascular exam 12/21/2021    Lung nodule seen on imaging study 12/16/2021   GERD (gastroesophageal reflux disease) 12/16/2021   Folate deficiency 11/16/2021   Left shoulder pain 11/16/2021   Positive screening for depression on 9-item Patient Health Questionnaire (PHQ-9) 09/29/2021   Preventative health care 09/29/2021   Insomnia 09/29/2021   PAD (peripheral artery disease) (HCC) 09/09/2021   Unilateral primary osteoarthritis, right hip 07/21/2021   Encounter for smoking cessation counseling 03/04/2020   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   Cervical stenosis of spine 01/14/2020   Other headache syndrome 01/01/2020   Abnormal CT of the chest 01/01/2020   Syncope and collapse 12/19/2019   Bronchitis 12/19/2019   NSTEMI (non-ST elevated myocardial infarction) (HCC) 06/30/2018   Family hx of colon cancer 02/14/2017   STEMI (ST elevation myocardial infarction) (HCC) 05/19/2016   Acute ST elevation myocardial infarction (STEMI) involving left circumflex coronary artery without development of Q waves (HCC) 05/19/2016   Ischemic cardiomyopathy 05/19/2016   Unstable angina (HCC) 08/06/2014   ST elevation myocardial infarction involving right coronary artery (HCC)    CAD (coronary artery disease)    Tobacco abuse    HLD (hyperlipidemia)    Hypertension    Family history of premature CAD 12/16/2013   CAD S/P PCI:  2.0 x 23 vision bare metal stent (2.25 mm) RPL 12/16/2013    Class: Acute   Schizophrenic disorder (HCC)    Bipolar 1 disorder (HCC)    Chest pain 06/24/2013   Past Medical History:  Diagnosis Date   Arthritis    Bipolar 1 disorder (HCC)    CAD (coronary artery disease)    a. 12/16/13 inferolat STEMI s/p BMS to RPL. b. Cath 08/2014: interim occlusion of the PLA stent of the RCA, collateralized from the left coronary artery with diffuse nonobstructive CAD;  c. STEMI/PCI: LM nl, LAD nl, LCX 126m/d (2.75x12 synergy DES), RCA 30p, RPDA 75ost/p, RPAV 100 w/ L->R collats.   Chronic back pain    GERD  (gastroesophageal reflux disease)    HLD (hyperlipidemia)    Hypertension    Ischemic cardiomyopathy    a. 12/2013: EF 50-55% by echo with +WMA. b. EF 45% by cath in 08/2014.   PAD (peripheral artery disease) (HCC)    s/p drug-coated balloon angioplasty right SFA, angioplasty/DES right EIA 09/09/21   Peptic ulcer disease    Schizophrenic disorder (HCC)    Tobacco abuse     Family History  Problem Relation Age of Onset   Heart attack Father  40   Heart attack Brother 40    Past Surgical History:  Procedure Laterality Date   ABDOMINAL AORTOGRAM W/LOWER EXTREMITY Bilateral 09/09/2021   Procedure: ABDOMINAL AORTOGRAM W/LOWER EXTREMITY;  Surgeon: Chuck Hint, MD;  Location: [facility] INVASIVE CV LAB;  Service: Cardiovascular;  Laterality: Bilateral;   CARDIAC CATHETERIZATION N/A 08/07/2014   Procedure: Left Heart Cath and Coronary Angiography;  Surgeon: Tonny Bollman, MD;  Location: [facility] INVASIVE CV LAB;  Service: Cardiovascular;  Laterality: N/A;   COLONOSCOPY N/A 06/06/2017   Procedure: COLONOSCOPY;  Surgeon: Malissa Hippo, MD;  Location: [facility] ENDO [facility];  Service: Endoscopy;  Laterality: N/A;  10:50   CORONARY STENT INTERVENTION N/A 05/19/2016   Procedure: Coronary Stent Intervention;  Surgeon: Runell Gess, MD;  Location: [facility] INVASIVE CV LAB;  Service: Cardiovascular;  Laterality: N/A;   CORONARY STENT INTERVENTION N/A 07/02/2018   Procedure: CORONARY STENT INTERVENTION;  Surgeon: Lyn Records, MD;  Location: [facility] INVASIVE CV LAB;  Service: Cardiovascular;  Laterality: N/A;   HERNIA REPAIR     LEFT HEART CATH AND CORONARY ANGIOGRAPHY N/A 05/19/2016   Procedure: Left Heart Cath and Coronary Angiography;  Surgeon: Runell Gess, MD;  Location: [facility] INVASIVE CV LAB;  Service: Cardiovascular;  Laterality: N/A;   LEFT HEART CATH AND CORONARY ANGIOGRAPHY N/A 07/02/2018   Procedure: LEFT HEART CATH AND CORONARY ANGIOGRAPHY;  Surgeon: Lyn Records, MD;  Location: [facility] INVASIVE CV LAB;   Service: Cardiovascular;  Laterality: N/A;   LEFT HEART CATH AND CORONARY ANGIOGRAPHY N/A 12/21/2021   Procedure: LEFT HEART CATH AND CORONARY ANGIOGRAPHY;  Surgeon: Marykay Lex, MD;  Location: [facility] INVASIVE CV LAB;  Service: Cardiovascular;  Laterality: N/A;   LEFT HEART CATHETERIZATION WITH CORONARY ANGIOGRAM N/A 12/16/2013   Procedure: LEFT HEART CATHETERIZATION WITH CORONARY ANGIOGRAM;  Surgeon: Corky Crafts, MD;  Location: [facility] CATH LAB;  Service: Cardiovascular;  Laterality: N/A;   PERCUTANEOUS CORONARY STENT INTERVENTION (PCI-S)  12/16/2013   Procedure: PERCUTANEOUS CORONARY STENT INTERVENTION (PCI-S);  Surgeon: Corky Crafts, MD;  Location: [facility] CATH LAB;  Service: Cardiovascular;;  distal rca   PERIPHERAL VASCULAR BALLOON ANGIOPLASTY  09/09/2021   Procedure: PERIPHERAL VASCULAR BALLOON ANGIOPLASTY;  Surgeon: Chuck Hint, MD;  Location: [facility] INVASIVE CV LAB;  Service: Cardiovascular;;  Rt SFA   PERIPHERAL VASCULAR INTERVENTION  09/09/2021   Procedure: PERIPHERAL VASCULAR INTERVENTION;  Surgeon: Chuck Hint, MD;  Location: [facility] INVASIVE CV LAB;  Service: Cardiovascular;;  Rt Iliac   TONSILLECTOMY     TOTAL HIP ARTHROPLASTY Right 03/15/2022   Procedure: RIGHT TOTAL HIP ARTHROPLASTY ANTERIOR APPROACH;  Surgeon: Eldred Manges, MD;  Location: [facility] OR;  Service: Orthopedics;  Laterality: Right;  Needs RNFA   Social History   Occupational History   Not on file  Tobacco Use   Smoking status: Every Day    Packs/day: 1.00    Years: 25.00    Additional pack years: 0.00    Total pack years: 25.00    Types: Cigarettes, Cigars    Start date: 05/25/1988   Smokeless tobacco: Former    Types: Snuff    Quit date: 05/21/1988  Vaping Use   Vaping Use: Never used  Substance and Sexual Activity   Alcohol use: No    Alcohol/week: 0.0 standard drinks of alcohol   Drug use: Yes    Types: Marijuana    Comment: last used today   Sexual activity: Not on file

## 2022-06-20 ENCOUNTER — Encounter (INDEPENDENT_AMBULATORY_CARE_PROVIDER_SITE_OTHER): Payer: Self-pay

## 2022-07-26 ENCOUNTER — Encounter (HOSPITAL_COMMUNITY): Payer: Medicaid Other

## 2022-07-27 ENCOUNTER — Encounter (HOSPITAL_COMMUNITY): Payer: Self-pay | Admitting: Certified Registered Nurse Anesthetist

## 2022-07-28 ENCOUNTER — Ambulatory Visit (HOSPITAL_COMMUNITY): Admission: RE | Admit: 2022-07-28 | Payer: Medicaid Other | Source: Home / Self Care | Admitting: Gastroenterology

## 2022-07-28 ENCOUNTER — Encounter (HOSPITAL_COMMUNITY): Admission: RE | Payer: Self-pay | Source: Home / Self Care

## 2022-07-28 DIAGNOSIS — Z1211 Encounter for screening for malignant neoplasm of colon: Secondary | ICD-10-CM

## 2022-07-28 SURGERY — COLONOSCOPY WITH PROPOFOL
Anesthesia: Monitor Anesthesia Care

## 2022-08-25 ENCOUNTER — Ambulatory Visit: Payer: MEDICAID | Attending: Cardiology | Admitting: Cardiology

## 2022-08-25 NOTE — Progress Notes (Deleted)
Clinical Summary Don Fletcher is a 52 y.o.male  seen today for follow up of the following medical problems.    1. CAD - pt admitted 12/16/13 with inferolateral STEMI, received BMS to proximal RPL. Full cath report below. LVEF 55% by LVgram, LVEF 50-55% by echo.    - on DAPT with ASA and effient, initially on brillinta but had some SOB and was changed while in the hospital   - Lexiscan MPI after cath in setting of chest pain Jan 2016 with inferior scar with mild to moderate ischemia, LVEF 60%. Overall low risk. Managed medically - admit 07/2014 with chest pain, cath showed occlusion in the PLA with left to right collateralization, managed medically.   -Admit 05/2016 with STEMI, received DES to LCX. LVgram 35-45% at taht time.  - patient left AMA shortly after procedure, did not have echo done.   -status post left circumflex PCI and NSTEMI in 2020, status post distal RCA PCI extending into the PDA with EF at 55%        - no recent chest pain. No SOB - only taking 3 meds, he is unsure which ones at home.     Underwent left heart cath on December 21, 2021 that showed angiographically stable coronary arteries, widely patent left circumflex stents and distal RCA-PDA stents and known occlusion of RPA V-PL system with left-to-right collaterals, entire RCA was diffusely diseased, no focal lesion was noted.     2. COPD - followed by Dr Juanetta Gosling.    3. Depression - follow by psychiatry     4. Vertigo/Dizziness - dizzienss with head position changes. Can occur with closing eyes and turning head to side. Can have some issues with sinus congestion.  - also can occur with standing. - drinks gallon of tea daily, nothing else.      5. PAD He is s/p R EIA stent and angioplasty ot R SFA    6. HTN - has not taken meds yet today.  7. Hip replacement 03/2022 Past Medical History:  Diagnosis Date   Arthritis    Bipolar 1 disorder (HCC)    CAD (coronary artery disease)    a. 12/16/13  inferolat STEMI s/p BMS to RPL. b. Cath 08/2014: interim occlusion of the PLA stent of the RCA, collateralized from the left coronary artery with diffuse nonobstructive CAD;  c. STEMI/PCI: LM nl, LAD nl, LCX 141m/d (2.75x12 synergy DES), RCA 30p, RPDA 75ost/p, RPAV 100 w/ L->R collats.   Chronic back pain    GERD (gastroesophageal reflux disease)    HLD (hyperlipidemia)    Hypertension    Ischemic cardiomyopathy    a. 12/2013: EF 50-55% by echo with +WMA. b. EF 45% by cath in 08/2014.   PAD (peripheral artery disease) (HCC)    s/p drug-coated balloon angioplasty right SFA, angioplasty/DES right EIA 09/09/21   Peptic ulcer disease    Schizophrenic disorder (HCC)    Tobacco abuse      Allergies  Allergen Reactions   Suboxone [Buprenorphine Hcl-Naloxone Hcl] Other (See Comments)    Caused kidney failure   Benadryl [Diphenhydramine] Other (See Comments)    LEG CRAMPING   Seroquel [Quetiapine] Other (See Comments)    "makes my legs cramp"     Current Outpatient Medications  Medication Sig Dispense Refill   aspirin EC 325 MG tablet Take 1 tablet (325 mg total) by mouth daily with breakfast.     benazepril (LOTENSIN) 40 MG tablet Take 1 tablet (40 mg  total) by mouth daily. 90 tablet 3   carvedilol (COREG) 6.25 MG tablet Take 1 tablet (6.25 mg total) by mouth 2 (two) times daily with a meal. 60 tablet 3   ezetimibe (ZETIA) 10 MG tablet Take 1 tablet (10 mg total) by mouth daily. 30 tablet 11   pantoprazole (PROTONIX) 20 MG tablet Take 1 tablet (20 mg total) by mouth daily. 90 tablet 1   No current facility-administered medications for this visit.     Past Surgical History:  Procedure Laterality Date   ABDOMINAL AORTOGRAM W/LOWER EXTREMITY Bilateral 09/09/2021   Procedure: ABDOMINAL AORTOGRAM W/LOWER EXTREMITY;  Surgeon: Chuck Hint, MD;  Location: [facility] INVASIVE CV LAB;  Service: Cardiovascular;  Laterality: Bilateral;   CARDIAC CATHETERIZATION N/A 08/07/2014   Procedure: Left  Heart Cath and Coronary Angiography;  Surgeon: Tonny Bollman, MD;  Location: [facility] INVASIVE CV LAB;  Service: Cardiovascular;  Laterality: N/A;   COLONOSCOPY N/A 06/06/2017   Procedure: COLONOSCOPY;  Surgeon: Malissa Hippo, MD;  Location: [facility] ENDO SUITE;  Service: Endoscopy;  Laterality: N/A;  10:50   CORONARY STENT INTERVENTION N/A 05/19/2016   Procedure: Coronary Stent Intervention;  Surgeon: Runell Gess, MD;  Location: [facility] INVASIVE CV LAB;  Service: Cardiovascular;  Laterality: N/A;   CORONARY STENT INTERVENTION N/A 07/02/2018   Procedure: CORONARY STENT INTERVENTION;  Surgeon: Lyn Records, MD;  Location: [facility] INVASIVE CV LAB;  Service: Cardiovascular;  Laterality: N/A;   HERNIA REPAIR     LEFT HEART CATH AND CORONARY ANGIOGRAPHY N/A 05/19/2016   Procedure: Left Heart Cath and Coronary Angiography;  Surgeon: Runell Gess, MD;  Location: [facility] INVASIVE CV LAB;  Service: Cardiovascular;  Laterality: N/A;   LEFT HEART CATH AND CORONARY ANGIOGRAPHY N/A 07/02/2018   Procedure: LEFT HEART CATH AND CORONARY ANGIOGRAPHY;  Surgeon: Lyn Records, MD;  Location: [facility] INVASIVE CV LAB;  Service: Cardiovascular;  Laterality: N/A;   LEFT HEART CATH AND CORONARY ANGIOGRAPHY N/A 12/21/2021   Procedure: LEFT HEART CATH AND CORONARY ANGIOGRAPHY;  Surgeon: Marykay Lex, MD;  Location: [facility] INVASIVE CV LAB;  Service: Cardiovascular;  Laterality: N/A;   LEFT HEART CATHETERIZATION WITH CORONARY ANGIOGRAM N/A 12/16/2013   Procedure: LEFT HEART CATHETERIZATION WITH CORONARY ANGIOGRAM;  Surgeon: Corky Crafts, MD;  Location: [facility] CATH LAB;  Service: Cardiovascular;  Laterality: N/A;   PERCUTANEOUS CORONARY STENT INTERVENTION (PCI-S)  12/16/2013   Procedure: PERCUTANEOUS CORONARY STENT INTERVENTION (PCI-S);  Surgeon: Corky Crafts, MD;  Location: [facility] CATH LAB;  Service: Cardiovascular;;  distal rca   PERIPHERAL VASCULAR BALLOON ANGIOPLASTY  09/09/2021   Procedure: PERIPHERAL VASCULAR BALLOON ANGIOPLASTY;   Surgeon: Chuck Hint, MD;  Location: [facility] INVASIVE CV LAB;  Service: Cardiovascular;;  Rt SFA   PERIPHERAL VASCULAR INTERVENTION  09/09/2021   Procedure: PERIPHERAL VASCULAR INTERVENTION;  Surgeon: Chuck Hint, MD;  Location: [facility] INVASIVE CV LAB;  Service: Cardiovascular;;  Rt Iliac   TONSILLECTOMY     TOTAL HIP ARTHROPLASTY Right 03/15/2022   Procedure: RIGHT TOTAL HIP ARTHROPLASTY ANTERIOR APPROACH;  Surgeon: Eldred Manges, MD;  Location: [facility] OR;  Service: Orthopedics;  Laterality: Right;  Needs RNFA     Allergies  Allergen Reactions   Suboxone [Buprenorphine Hcl-Naloxone Hcl] Other (See Comments)    Caused kidney failure   Benadryl [Diphenhydramine] Other (See Comments)    LEG CRAMPING   Seroquel [Quetiapine] Other (See Comments)    "makes my legs cramp"      Family History  Problem Relation Age of Onset  Heart attack Father 74   Heart attack Brother 31     Social History Mr. Carmickle reports that he has been smoking cigarettes and cigars. He started smoking about 34 years ago. He has a 34.2 pack-year smoking history. He quit smokeless tobacco use about 34 years ago.  His smokeless tobacco use included snuff. Mr. Ammon reports no history of alcohol use.   Review of Systems CONSTITUTIONAL: No weight loss, fever, chills, weakness or fatigue.  HEENT: Eyes: No visual loss, blurred vision, double vision or yellow sclerae.No hearing loss, sneezing, congestion, runny nose or sore throat.  SKIN: No rash or itching.  CARDIOVASCULAR:  RESPIRATORY: No shortness of breath, cough or sputum.  GASTROINTESTINAL: No anorexia, nausea, vomiting or diarrhea. No abdominal pain or blood.  GENITOURINARY: No burning on urination, no polyuria NEUROLOGICAL: No headache, dizziness, syncope, paralysis, ataxia, numbness or tingling in the extremities. No change in bowel or bladder control.  MUSCULOSKELETAL: No muscle, back pain, joint pain or stiffness.  LYMPHATICS: No enlarged nodes.  No history of splenectomy.  PSYCHIATRIC: No history of depression or anxiety.  ENDOCRINOLOGIC: No reports of sweating, cold or heat intolerance. No polyuria or polydipsia.  [name]   Physical Examination There were no vitals filed for this visit. There were no vitals filed for this visit.  Gen: resting comfortably, no acute distress HEENT: no scleral icterus, pupils equal round and reactive, no palptable cervical adenopathy,  CV Resp: Clear to auscultation bilaterally GI: abdomen is soft, non-tender, non-distended, normal bowel sounds, no hepatosplenomegaly MSK: extremities are warm, no edema.  Skin: warm, no rash Neuro:  no focal deficits Psych: appropriate affect   Diagnostic Studies 12/2013 Echo Study Conclusions  - Left ventricle: The cavity size was normal. Wall thickness was   normal. Systolic function was normal. The estimated ejection   fraction was in the range of 50% to 55%. There is akinesis of the   basal-midinferior myocardium. There is akinesis of the   basalinferolateral myocardium. Left ventricular diastolic   function parameters were normal. - Aortic valve: Mildly calcified annulus. Trileaflet. - Mitral valve: Mildly thickened leaflets . There was trivial   regurgitation. - Right atrium: Central venous pressure (est): 3 mm Hg. - Atrial septum: No defect or patent foramen ovale was identified. - Tricuspid valve: There was trivial regurgitation. - Pulmonary arteries: Systolic pressure could not be accurately   estimated. - Pericardium, extracardiac: There was no pericardial effusion.  Impressions:  - Normal LV wall thickness and chamber size with LVEF 50-55%, wall   motion abnormalities as outlined above consistent with ischemic   heart disease, normal diastolic function. Mildly thickened mitral   leaflets with trivial mitral regurgitation. Trivial tricuspid   regurgitation, unable to assess PASP.   12/16/13 Cath HEMODYNAMICS:  Aortic pressure was 91/60; LV  pressure was 91/9; LVEDP 17.  There was no gradient between the left ventricle and aorta.    ANGIOGRAPHIC DATA:   The left main coronary artery has mild disease. The left anterior descending artery is a large vessel proximally. In the mid vessel, there is mild, diffuse disease. There are several medium-size diagonals which are widely patent. The left circumflex artery is large vessel. There is mild disease in the proximal portion. The OM1 is a large vessel. There is mild to moderate disease in the mid vessel, up to 40%. The right coronary artery is a large, dominant vessel. In the proximal portion, there is a moderate stenosis of up to 50%. The posterior descending artery is a  large vessel and reaches the apex and is widely patent. The posterior lateral artery is occluded proximally, just past the ostium.  After opening the vessel of the balloon, it was noted that the disease extended into the mid posterior lateral artery. The posterior lateral artery was small caliber. LEFT VENTRICULOGRAM:  Left ventricular angiogram was done in the 30 RAO projection and revealed mild, basal inferior hypokinesis with normal systolic function with an estimated ejection fraction of 55%.  LVEDP was 17 mmHg. PCI NARRATIVE: A JR4 guiding catheter used to engage the RCA. Angiomax was used for anticoagulation. ACT was used to check that the Angiomax was therapeutic. A pro-water wire was placed across the area disease in the posterior lateral artery. An aspiration catheter was advanced to the ostium of the posterior lateral artery. Aspiration thrombectomy was performed with some improvement in flow. A 2.0 x 12 balloon was then used to predilate patient. The vessel was fairly small in caliber. Since the patient admits to prior bleeding issues as well as frequently forgetting to take his medications, we opted for a bare-metal stent. A 2.0 x 23 vision bare metal stent was deployed. The proximalstent was postdilated  with a 2.25 by 12  noncompliant balloon. There was an excellent angiographic result of the proximal stent and the bifurcation with the posterior descending artery. There was a stepdown at the distal edge of the stent. TIMI-3 flow was restored. The patient's pain was significantly improved. IMPRESSIONS: Widely patent left main coronary artery. Mild disease in the left anterior descending artery and its branches. Mild disease in the left circumflex artery and its branches. Moderate disease in the proximal right coronary artery.occluded posterior lateral artery which was the culprit for the patient's presentation. This was successfully treated with a 2.0 x 23 vision bare metal stent, postdilated to 2.25 mm in diameter. Normal left ventricular systolic function.  LVEDP 17 mmHg.  Ejection fraction 55%. RECOMMENDATION:  Continue dual antiplatelet therapy for at least 30 days and likely longer. A bare-metal stent was chosen because the patient admits to forgetting to take his medication fairly frequently. He also stated that his wife does not remember to take medicines as well. Continue aggressive secondary prevention. He'll be watched in the ICU. Continue Angiomax at the reduced rate until the bag runs out. He needs to stop smoking.  Significant family h/o CAD.  Start beta blocker.   Start statin. Nicotine patch.  Consider f/u in [facility] if that is easier for him to be compliant.     Jan 2016 Lexiscan MPI IMPRESSION: 1. Region of a mixed scar and small area of mild to moderate peri-infarct ischemia noted in the inferior wall as outlined. Cannot exclude a minor region of mid anterior ischemia as well.   2. Normal left ventricular wall motion.   3. Left ventricular ejection fraction 60%   4. Overall low-risk stress test findings*.   08/2014 cath FINAL CONCLUSIONS: SINGLE VESSEL CAD WITH OCCLUSION OF THE PLA STENT IN THE RIGHT CORONARY, COLLATERALIZED FROM THE LEFT CORONARY ARTERY DIFFUSE NONOBSTRUCTIVE  CAD MILD SEGMENTAL LV DYSFUNCTION WITH INFERIOR AKINESIS AND ESTIMATED LVEF 45%   RECOMMENDATIONS: MEDICAL THERAPY OK FOR DISCHARGE HOME TODAY     05/2016 cath Prox RCA lesion, 30 %stenosed. Post Atrio lesion, 100 %stenosed. Ost RPDA to RPDA lesion, 75 %stenosed. Mid Cx to Dist Cx lesion, 100 %stenosed. Post intervention, there is a 0% residual stenosis. A stent was successfully placed. There is moderate to severe left ventricular systolic dysfunction. LV end  diastolic pressure is mildly elevated. The left ventricular ejection fraction is 35-45% by visual estimate.   12/2021 nuclear stress  Lexiscan on 12/08/2021:   Findings are consistent with prior myocardial infarction with peri-infarct ischemia. The study is low to intermediate risk.   No ST deviation was noted. The ECG was negative for ischemia.   LV perfusion is abnormal.  Small to intermediate sized, moderate intensity, apical to basal inferior defect with partial reversibility most prominently mid to apical distribution consistent with scar and mild to moderate peri-infarct ischemia in the RCA distribution.  Summed stress score is 11.   Left ventricular function is normal. Nuclear stress EF: 54 %. End diastolic cavity size is normal. End systolic cavity size is normal.   Low to intermediate risk study with evidence of inferior infarct scar and mild to moderate peri-infarct ischemia, LVEF 54%.  12/2021 cath   Prox RCA to Mid RCA lesion is 50% stenosed. Mid RCA lesion is 50% stenosed. Mid RCA to Dist RCA lesion is 50% stenosed.   RPAV lesion is 100% stenosed with side  in 1st RPL. Previously Placed 1st PL stent is 100% stenosed.  The 1st PL fills distally via L-R collaterals   Previously placed Dist RCA stent is 10% stenosed.  Previously placed RPDA stent is 5% stenosed.   Previously placed Prox Cx to Dist Cx stent of unknown type is  widely patent.   Prox LAD to Mid LAD lesion is 35% stenosed.     POST-OPERATIVE  DIAGNOSIS:   Angiographically stable coronary arteries.   Widely patent LCx stents and distal RCA-PDA stents and known occlusion of the RPAV-PL system with left-to-right collaterals.   The entire RCA is diffusely diseased, but no focal lesion noted. Normal left ventriculogram with normal LVEDP.    Assessment and Plan   1. CAD/Chronic systolic HF - no recent symptoms - has completed 1 year of DAPT, now off plavix - continue current meds. Obtain echo, had decreased LVEF by LVgram at time of MI. He left AMA prior to having echo done. - encouraged medication complance, he is to call us with exactly the list of meds he has been taking at home. Only taking 3 pills, should be a total of 4 (asa, atorvastatin, prinzide, Toprol) - EKG today in clinic shows SR, no ischemic changes   2. HTN - elevated but has not taken meds yet today, also may not be on all his meds at home - he is to call and update [place] on his home pills.    3. Hyperlipidemia -he will continue statin   4. Dizziness - symptoms mixed, suggesting vertigo but also possibly some orthostasis - orthostatics today are negative. Poor oral hydration, encouraged increased water intake and less caffeinated beverages - given Rx for meclizine 25mg  q8hrs prn dizziness   5. Leg pains - mixed symptoms, likely some lower back issues but also possible PAD. Decreased pulses on exam, history of CAD - obtain ABIs     Antoine Poche, M.D., F.A.C.C.

## 2022-09-29 ENCOUNTER — Ambulatory Visit: Payer: Medicaid Other | Admitting: Internal Medicine

## 2022-11-01 ENCOUNTER — Other Ambulatory Visit (INDEPENDENT_AMBULATORY_CARE_PROVIDER_SITE_OTHER): Payer: MEDICAID

## 2022-11-01 ENCOUNTER — Ambulatory Visit: Payer: MEDICAID | Admitting: Orthopaedic Surgery

## 2022-11-01 ENCOUNTER — Encounter: Payer: Self-pay | Admitting: Orthopaedic Surgery

## 2022-11-01 VITALS — Ht 67.0 in | Wt 177.0 lb

## 2022-11-01 DIAGNOSIS — M25551 Pain in right hip: Secondary | ICD-10-CM | POA: Diagnosis not present

## 2022-11-01 NOTE — Progress Notes (Unsigned)
Office Visit Note   Patient: Don Fletcher           Date of Birth: May 03, 1970           MRN: [id number] Visit Date: 11/01/2022              Requested by: Billie Lade, MD [address] [place],  [place] [address] PCP: Billie Lade, MD   Assessment & Plan: Visit Diagnoses:  1. Pain of right hip     Plan: Patient can use a cane in his left hand.  We again discussed smoking cessation.  He is already had an MI, bronchitis coronary artery disease metal cardiac stents.  He can follow-up as needed.  Follow-Up Instructions: No follow-ups on file.   Orders:  Orders Placed This Encounter  Procedures   XR HIP UNILAT W OR W/O PELVIS 2-3 VIEWS RIGHT   No orders of the defined types were placed in this encounter.     Procedures: No procedures performed   Clinical Data: No additional findings.   Subjective: Chief Complaint  Patient presents with   Right Hip - Pain    Playing around and landed on the bed wrong maybe. Having pain in the groin area and outside of right hip. I can walk a little ways and turn around to walk back and I will be limping.    HPI 52 year old male post total hip arthroplasty in the right hip was jumping on the bed when he developed acute pain in his right groin and has had persistent problems for a few weeks after that time is concerned something might of happened.  Keeping ambulatory without limping after his total hip arthroplasty 03/15/2022.  Review of Systems all systems updated unchanged.   Objective: Vital Signs: Ht 5\' 7"  (1.702 m)   Wt 177 lb (80.3 kg)   BMI 27.72 kg/m   Physical Exam  Ortho Exam  Specialty Comments:  No specialty comments available.  Imaging: No results found.   PMFS History: Patient Active Problem List   Diagnosis Date Noted   Status post total hip replacement, right 03/15/2022   Abnormal nuclear stress test 12/21/2021   Preop cardiovascular exam 12/21/2021   Lung nodule seen on imaging study  12/16/2021   GERD (gastroesophageal reflux disease) 12/16/2021   Folate deficiency 11/16/2021   Left shoulder pain 11/16/2021   Positive screening for depression on 9-item Patient Health Questionnaire (PHQ-9) 09/29/2021   Preventative health care 09/29/2021   Insomnia 09/29/2021   PAD (peripheral artery disease) (HCC) 09/09/2021   Unilateral primary osteoarthritis, right hip 07/21/2021   Encounter for smoking cessation counseling 03/04/2020   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   Cervical stenosis of spine 01/14/2020   Other headache syndrome 01/01/2020   Abnormal CT of the chest 01/01/2020   Syncope and collapse 12/19/2019   Bronchitis 12/19/2019   NSTEMI (non-ST elevated myocardial infarction) (HCC) 06/30/2018   Family hx of colon cancer 02/14/2017   STEMI (ST elevation myocardial infarction) (HCC) 05/19/2016   Acute ST elevation myocardial infarction (STEMI) involving left circumflex coronary artery without development of Q waves (HCC) 05/19/2016   Ischemic cardiomyopathy 05/19/2016   Unstable angina (HCC) 08/06/2014   ST elevation myocardial infarction involving right coronary artery (HCC)    CAD (coronary artery disease)    Tobacco abuse    HLD (hyperlipidemia)    Hypertension    Family history of premature CAD 12/16/2013   CAD S/P PCI:  2.0 x 23 vision bare  metal stent (2.25 mm) RPL 12/16/2013    Class: Acute   Schizophrenic disorder (HCC)    Bipolar 1 disorder (HCC)    Chest pain 06/24/2013   Past Medical History:  Diagnosis Date   Arthritis    Bipolar 1 disorder (HCC)    CAD (coronary artery disease)    a. 12/16/13 inferolat STEMI s/p BMS to RPL. b. Cath 08/2014: interim occlusion of the PLA stent of the RCA, collateralized from the left coronary artery with diffuse nonobstructive CAD;  c. STEMI/PCI: LM nl, LAD nl, LCX 130m/d (2.75x12 synergy DES), RCA 30p, RPDA 75ost/p, RPAV 100 w/ L->R collats.   Chronic back pain    GERD (gastroesophageal reflux disease)    HLD  (hyperlipidemia)    Hypertension    Ischemic cardiomyopathy    a. 12/2013: EF 50-55% by echo with +WMA. b. EF 45% by cath in 08/2014.   PAD (peripheral artery disease) (HCC)    s/p drug-coated balloon angioplasty right SFA, angioplasty/DES right EIA 09/09/21   Peptic ulcer disease    Schizophrenic disorder (HCC)    Tobacco abuse     Family History  Problem Relation Age of Onset   Heart attack Father 65   Heart attack Brother 84    Past Surgical History:  Procedure Laterality Date   ABDOMINAL AORTOGRAM W/LOWER EXTREMITY Bilateral 09/09/2021   Procedure: ABDOMINAL AORTOGRAM W/LOWER EXTREMITY;  Surgeon: Chuck Hint, MD;  Location: [facility] INVASIVE CV LAB;  Service: Cardiovascular;  Laterality: Bilateral;   CARDIAC CATHETERIZATION N/A 08/07/2014   Procedure: Left Heart Cath and Coronary Angiography;  Surgeon: Tonny Bollman, MD;  Location: [facility] INVASIVE CV LAB;  Service: Cardiovascular;  Laterality: N/A;   COLONOSCOPY N/A 06/06/2017   Procedure: COLONOSCOPY;  Surgeon: Malissa Hippo, MD;  [facility];  Service: Endoscopy;  Laterality: N/A;  10:50   CORONARY STENT INTERVENTION N/A 05/19/2016   Procedure: Coronary Stent Intervention;  Surgeon: Runell Gess, MD;  Location: [facility] INVASIVE CV LAB;  Service: Cardiovascular;  Laterality: N/A;   CORONARY STENT INTERVENTION N/A 07/02/2018   Procedure: CORONARY STENT INTERVENTION;  Surgeon: Lyn Records, MD;  Location: [facility] INVASIVE CV LAB;  Service: Cardiovascular;  Laterality: N/A;   HERNIA REPAIR     LEFT HEART CATH AND CORONARY ANGIOGRAPHY N/A 05/19/2016   Procedure: Left Heart Cath and Coronary Angiography;  Surgeon: Runell Gess, MD;  Location: [facility] INVASIVE CV LAB;  Service: Cardiovascular;  Laterality: N/A;   LEFT HEART CATH AND CORONARY ANGIOGRAPHY N/A 07/02/2018   Procedure: LEFT HEART CATH AND CORONARY ANGIOGRAPHY;  Surgeon: Lyn Records, MD;  Location: [facility] INVASIVE CV LAB;  Service: Cardiovascular;  Laterality: N/A;    LEFT HEART CATH AND CORONARY ANGIOGRAPHY N/A 12/21/2021   Procedure: LEFT HEART CATH AND CORONARY ANGIOGRAPHY;  Surgeon: Marykay Lex, MD;  Location: [facility] INVASIVE CV LAB;  Service: Cardiovascular;  Laterality: N/A;   LEFT HEART CATHETERIZATION WITH CORONARY ANGIOGRAM N/A 12/16/2013   Procedure: LEFT HEART CATHETERIZATION WITH CORONARY ANGIOGRAM;  Surgeon: Corky Crafts, MD;  Location: [facility] CATH LAB;  Service: Cardiovascular;  Laterality: N/A;   PERCUTANEOUS CORONARY STENT INTERVENTION (PCI-S)  12/16/2013   Procedure: PERCUTANEOUS CORONARY STENT INTERVENTION (PCI-S);  Surgeon: Corky Crafts, MD;  Location: [facility] CATH LAB;  Service: Cardiovascular;;  distal rca   PERIPHERAL VASCULAR BALLOON ANGIOPLASTY  09/09/2021   Procedure: PERIPHERAL VASCULAR BALLOON ANGIOPLASTY;  Surgeon: Chuck Hint, MD;  Location: [facility] INVASIVE CV LAB;  Service: Cardiovascular;;  Rt SFA  PERIPHERAL VASCULAR INTERVENTION  09/09/2021   Procedure: PERIPHERAL VASCULAR INTERVENTION;  Surgeon: Chuck Hint, MD;  Location: [facility] INVASIVE CV LAB;  Service: Cardiovascular;;  Rt Iliac   TONSILLECTOMY     TOTAL HIP ARTHROPLASTY Right 03/15/2022   Procedure: RIGHT TOTAL HIP ARTHROPLASTY ANTERIOR APPROACH;  Surgeon: Eldred Manges, MD;  Location: [facility] OR;  Service: Orthopedics;  Laterality: Right;  Needs RNFA   Social History   Occupational History   Not on file  Tobacco Use   Smoking status: Every Day    Current packs/day: 1.00    Average packs/day: 1 pack/day for 34.4 years (34.4 ttl pk-yrs)    Types: Cigarettes, Cigars    Start date: 05/25/1988   Smokeless tobacco: Former    Types: Snuff    Quit date: 05/21/1988  Vaping Use   Vaping status: Never Used  Substance and Sexual Activity   Alcohol use: No    Alcohol/week: 0.0 standard drinks of alcohol   Drug use: Yes    Types: Marijuana    Comment: last used today   Sexual activity: Not on file

## 2023-04-16 ENCOUNTER — Telehealth: Payer: Self-pay | Admitting: *Deleted

## 2023-04-16 NOTE — Telephone Encounter (Signed)
 Patient called c/o of pain in RLE pain. Patient states his pain is like it was before he had an intervention. Patient has history of iliac stent. Scheduled patient to have aorta iliac and ABI and to be seen by Dr Randie Heinz.

## 2023-04-17 ENCOUNTER — Other Ambulatory Visit: Payer: Self-pay | Admitting: *Deleted

## 2023-04-17 DIAGNOSIS — I739 Peripheral vascular disease, unspecified: Secondary | ICD-10-CM

## 2023-04-18 ENCOUNTER — Encounter: Payer: Self-pay | Admitting: Vascular Surgery

## 2023-04-18 ENCOUNTER — Ambulatory Visit (INDEPENDENT_AMBULATORY_CARE_PROVIDER_SITE_OTHER): Payer: MEDICAID | Admitting: Vascular Surgery

## 2023-04-18 ENCOUNTER — Ambulatory Visit (HOSPITAL_COMMUNITY)
Admission: RE | Admit: 2023-04-18 | Discharge: 2023-04-18 | Disposition: A | Discharge status: Home / Self Care | Payer: MEDICAID | Source: Ambulatory Visit | Attending: Vascular Surgery | Admitting: Vascular Surgery

## 2023-04-18 ENCOUNTER — Ambulatory Visit (INDEPENDENT_AMBULATORY_CARE_PROVIDER_SITE_OTHER)
Admission: RE | Admit: 2023-04-18 | Discharge: 2023-04-18 | Discharge status: Home / Self Care | Payer: MEDICAID | Source: Ambulatory Visit | Attending: Vascular Surgery

## 2023-04-18 VITALS — BP 168/120 | HR 84 | Temp 98.5°F | Resp 20 | Ht 67.0 in | Wt 187.0 lb

## 2023-04-18 DIAGNOSIS — I70213 Atherosclerosis of native arteries of extremities with intermittent claudication, bilateral legs: Secondary | ICD-10-CM

## 2023-04-18 DIAGNOSIS — I739 Peripheral vascular disease, unspecified: Secondary | ICD-10-CM

## 2023-04-18 LAB — VAS US ABI WITH/WO TBI
Left ABI: 1.01
Right ABI: 0.72

## 2023-04-18 NOTE — Progress Notes (Signed)
 Patient ID: Don Fletcher, male   DOB: 03-04-1970, 53 y.o.   MRN: [id number]  Reason for Consult: Follow-up   Referred by Billie Lade, MD  Subjective:     HPI:  Don Fletcher is a 53 y.o. male has a history of right external iliac artery stenting and right SFA balloon angioplasty approximately 18 months ago for short distance life limiting and disabling claudication.  He states the treatment of his right leg initially triggered all of his symptoms of his bilateral lower extremities.  He has subsequently undergone hip replacement on the right.  For the last 2 weeks he has noted return of symptoms of the bilateral lower extremities with calf cramping with even short distance walking to the parking lot.  He remains on aspirin he does not take Plavix and does not take a statin.  He uses cigars but only chews on them and states that he does not smoke them no longer uses any other drugs or alcohol.  He denies tissue loss or ulceration.  Past Medical History:  Diagnosis Date   Arthritis    Bipolar 1 disorder (HCC)    CAD (coronary artery disease)    a. 12/16/13 inferolat STEMI s/p BMS to RPL. b. Cath 08/2014: interim occlusion of the PLA stent of the RCA, collateralized from the left coronary artery with diffuse nonobstructive CAD;  c. STEMI/PCI: LM nl, LAD nl, LCX 168m/d (2.75x12 synergy DES), RCA 30p, RPDA 75ost/p, RPAV 100 w/ L->R collats.   Chronic back pain    GERD (gastroesophageal reflux disease)    HLD (hyperlipidemia)    Hypertension    Ischemic cardiomyopathy    a. 12/2013: EF 50-55% by echo with +WMA. b. EF 45% by cath in 08/2014.   PAD (peripheral artery disease) (HCC)    s/p drug-coated balloon angioplasty right SFA, angioplasty/DES right EIA 09/09/21   Peptic ulcer disease    Schizophrenic disorder (HCC)    Tobacco abuse    Family History  Problem Relation Age of Onset   Heart attack Father 27   Heart attack Brother 4   Past Surgical History:  Procedure Laterality  Date   ABDOMINAL AORTOGRAM W/LOWER EXTREMITY Bilateral 09/09/2021   Procedure: ABDOMINAL AORTOGRAM W/LOWER EXTREMITY;  Surgeon: Chuck Hint, MD;  Location: [facility] INVASIVE CV LAB;  Service: Cardiovascular;  Laterality: Bilateral;   CARDIAC CATHETERIZATION N/A 08/07/2014   Procedure: Left Heart Cath and Coronary Angiography;  Surgeon: Tonny Bollman, MD;  Location: [facility] INVASIVE CV LAB;  Service: Cardiovascular;  Laterality: N/A;   COLONOSCOPY N/A 06/06/2017   Procedure: COLONOSCOPY;  Surgeon: Malissa Hippo, MD;  [facility];  Service: Endoscopy;  Laterality: N/A;  10:50   CORONARY STENT INTERVENTION N/A 05/19/2016   Procedure: Coronary Stent Intervention;  Surgeon: Runell Gess, MD;  Location: [facility] INVASIVE CV LAB;  Service: Cardiovascular;  Laterality: N/A;   CORONARY STENT INTERVENTION N/A 07/02/2018   Procedure: CORONARY STENT INTERVENTION;  Surgeon: Lyn Records, MD;  Location: [facility] INVASIVE CV LAB;  Service: Cardiovascular;  Laterality: N/A;   HERNIA REPAIR     LEFT HEART CATH AND CORONARY ANGIOGRAPHY N/A 05/19/2016   Procedure: Left Heart Cath and Coronary Angiography;  Surgeon: Runell Gess, MD;  Location: [facility] INVASIVE CV LAB;  Service: Cardiovascular;  Laterality: N/A;   LEFT HEART CATH AND CORONARY ANGIOGRAPHY N/A 07/02/2018   Procedure: LEFT HEART CATH AND CORONARY ANGIOGRAPHY;  Surgeon: Lyn Records, MD;  Location: [facility] INVASIVE CV LAB;  Service: Cardiovascular;  Laterality: N/A;   LEFT HEART CATH AND CORONARY ANGIOGRAPHY N/A 12/21/2021   Procedure: LEFT HEART CATH AND CORONARY ANGIOGRAPHY;  Surgeon: Marykay Lex, MD;  Location: [facility] INVASIVE CV LAB;  Service: Cardiovascular;  Laterality: N/A;   LEFT HEART CATHETERIZATION WITH CORONARY ANGIOGRAM N/A 12/16/2013   Procedure: LEFT HEART CATHETERIZATION WITH CORONARY ANGIOGRAM;  Surgeon: Corky Crafts, MD;  Location: [facility] CATH LAB;  Service: Cardiovascular;  Laterality: N/A;   PERCUTANEOUS CORONARY STENT  INTERVENTION (PCI-S)  12/16/2013   Procedure: PERCUTANEOUS CORONARY STENT INTERVENTION (PCI-S);  Surgeon: Corky Crafts, MD;  Location: [facility] CATH LAB;  Service: Cardiovascular;;  distal rca   PERIPHERAL VASCULAR BALLOON ANGIOPLASTY  09/09/2021   Procedure: PERIPHERAL VASCULAR BALLOON ANGIOPLASTY;  Surgeon: Chuck Hint, MD;  Location: [facility] INVASIVE CV LAB;  Service: Cardiovascular;;  Rt SFA   PERIPHERAL VASCULAR INTERVENTION  09/09/2021   Procedure: PERIPHERAL VASCULAR INTERVENTION;  Surgeon: Chuck Hint, MD;  Location: [facility] INVASIVE CV LAB;  Service: Cardiovascular;;  Rt Iliac   TONSILLECTOMY     TOTAL HIP ARTHROPLASTY Right 03/15/2022   Procedure: RIGHT TOTAL HIP ARTHROPLASTY ANTERIOR APPROACH;  Surgeon: Eldred Manges, MD;  Location: [facility] OR;  Service: Orthopedics;  Laterality: Right;  Needs RNFA    Short Social History:  Social History   Tobacco Use   Smoking status: Every Day    Current packs/day: 1.00    Average packs/day: 1 pack/day for 34.9 years (34.9 ttl pk-yrs)    Types: Cigarettes, Cigars    Start date: 05/25/1988   Smokeless tobacco: Former    Types: Snuff    Quit date: 05/21/1988  Substance Use Topics   Alcohol use: No    Alcohol/week: 0.0 standard drinks of alcohol    Allergies  Allergen Reactions   Suboxone [Buprenorphine Hcl-Naloxone Hcl] Other (See Comments)    Caused kidney failure   Benadryl [Diphenhydramine] Other (See Comments)    LEG CRAMPING   Seroquel [Quetiapine] Other (See Comments)    "makes my legs cramp"    Current Outpatient Medications  Medication Sig Dispense Refill   aspirin EC 325 MG tablet Take 1 tablet (325 mg total) by mouth daily with breakfast.     benazepril (LOTENSIN) 40 MG tablet Take 1 tablet (40 mg total) by mouth daily. 90 tablet 3   carvedilol (COREG) 6.25 MG tablet Take 1 tablet (6.25 mg total) by mouth 2 (two) times daily with a meal. 60 tablet 3   ezetimibe (ZETIA) 10 MG tablet Take 1 tablet (10 mg total) by  mouth daily. 30 tablet 11   pantoprazole (PROTONIX) 20 MG tablet Take 1 tablet (20 mg total) by mouth daily. 90 tablet 1   No current facility-administered medications for this visit.    Review of Systems  Constitutional:  Constitutional negative. HENT: HENT negative.  Eyes: Eyes negative.  Cardiovascular: Positive for claudication.  GI: Gastrointestinal negative.  Musculoskeletal: Musculoskeletal negative.  Skin: Skin negative.  Neurological: Neurological negative. Hematologic: Hematologic/lymphatic negative.  Psychiatric: Psychiatric negative.        Objective:  Objective   Vitals:   04/18/23 0845  BP: (!) 168/120  Pulse: 84  Resp: 20  Temp: 98.5 F (36.9 C)  SpO2: 96%  Weight: 187 lb (84.8 kg)  Height: 5\' 7"  (1.702 m)   Body mass index is 29.29 kg/m.  Physical Exam HENT:     Head: Normocephalic.  Cardiovascular:     Rate and Rhythm: Normal rate.     Pulses:  Femoral pulses are 2+ on the right side and 2+ on the left side.      Popliteal pulses are 2+ on the right side and 2+ on the left side.       Posterior tibial pulses are 2+ on the right side and 2+ on the left side.  Pulmonary:     Effort: Pulmonary effort is normal.  Abdominal:     General: Abdomen is flat.  Musculoskeletal:        General: Normal range of motion.     Right lower leg: No edema.     Left lower leg: No edema.  Skin:    General: Skin is warm.     Capillary Refill: Capillary refill takes less than 2 seconds.  Neurological:     General: No focal deficit present.     Mental Status: He is alert.  Psychiatric:        Mood and Affect: Mood normal.     Data: ABI Findings:  +---------+------------------+-----+--------+--------+  Right   Rt Pressure (mmHg)IndexWaveformComment   +---------+------------------+-----+--------+--------+  Brachial 170                                      +---------+------------------+-----+--------+--------+  PTA     122                0.72 biphasic          +---------+------------------+-----+--------+--------+  DP      100               0.59 biphasic          +---------+------------------+-----+--------+--------+  Great Toe119               0.70                   +---------+------------------+-----+--------+--------+   +---------+------------------+-----+---------+-------+  Left    Lt Pressure (mmHg)IndexWaveform Comment  +---------+------------------+-----+---------+-------+  Brachial 170                                      +---------+------------------+-----+---------+-------+  PTA     172               1.01 triphasic         +---------+------------------+-----+---------+-------+  DP      140               0.82 biphasic          +---------+------------------+-----+---------+-------+  Great Toe139               0.82                   +---------+------------------+-----+---------+-------+   +-------+-----------+-----------+------------+------------+  ABI/TBIToday's ABIToday's TBIPrevious ABIPrevious TBI  +-------+-----------+-----------+------------+------------+  Right 0.72       0.70                                 +-------+-----------+-----------+------------+------------+  Left  1.01       0.82                                 +-------+-----------+-----------+------------+------------+           Summary:  Right: Resting right ankle-brachial  index indicates moderate right lower  extremity arterial disease. The right toe-brachial index is normal.   Left: Resting left ankle-brachial index is within normal range. The left  toe-brachial index is normal.    Right Stent(s):  +---------------+--------+---------------+--------+--------+  EIA           PSV cm/sStenosis       WaveformComments  +---------------+--------+---------------+--------+--------+  Prox to Stent  180                                       +---------------+--------+---------------+--------+--------+  Proximal Stent 492     50-99% stenosis                  +---------------+--------+---------------+--------+--------+  Mid Stent      143                                      +---------------+--------+---------------+--------+--------+  Distal Stent   143                                      +---------------+--------+---------------+--------+--------+  Distal to [id number]                                      +---------------+--------+---------------+--------+--------+      Summary:  Stenosis: +--------------------+---------------+  Location            Stent            +--------------------+---------------+  Right External Iliac50-99% stenosis  +--------------------+---------------+      Assessment/Plan:     53 year old male with history of life-limiting disabling right lower extremity claudication that resolved with treatment of right external iliac artery with stenting and angioplasty of the right SFA approximately 18 months ago.  Symptoms have returned for 2 weeks.  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  I have recommended abstaining from all tobacco products even though patient states he only chews on the cigars I still have recommended avoiding them.  I have recommended continuing his aspirin and also the addition of statin but patient states he will not take at this time.  Given the new onset of symptoms with palpable pulses I have recommended short interval follow-up in 3 to 4 months.  If his symptoms do not resolve or worsen with time we can consider angiography from the left common femoral approach to treat the in-stent stenosis as well as likely recurrent SFA stenosis.  All questions were answered in presence of his wife and he demonstrates good understanding today.    Maeola Harman MD [facility]

## 2023-04-20 ENCOUNTER — Other Ambulatory Visit: Payer: Self-pay | Admitting: *Deleted

## 2023-04-20 DIAGNOSIS — I70213 Atherosclerosis of native arteries of extremities with intermittent claudication, bilateral legs: Secondary | ICD-10-CM

## 2023-04-20 DIAGNOSIS — I739 Peripheral vascular disease, unspecified: Secondary | ICD-10-CM

## 2023-06-14 ENCOUNTER — Telehealth: Payer: Self-pay

## 2023-06-14 NOTE — Telephone Encounter (Signed)
 Copied from CRM [id number]. Topic: Appointments - Scheduling Inquiry for Clinic >> Jun 14, 2023 11:36 AM Don Fletcher wrote: Reason for CRM: pt needs a hospital follow up. Was released on 5/6 , was scheduled for 5/20 but needs to be seen sooner.

## 2023-06-14 NOTE — Telephone Encounter (Signed)
 Called patient will keep his 05.20.2025 with Dr Kermit Ped.

## 2023-06-26 ENCOUNTER — Encounter: Payer: Self-pay | Admitting: Internal Medicine

## 2023-06-26 ENCOUNTER — Ambulatory Visit (INDEPENDENT_AMBULATORY_CARE_PROVIDER_SITE_OTHER): Payer: MEDICAID | Admitting: Internal Medicine

## 2023-06-26 VITALS — BP 116/72 | HR 71 | Ht 67.0 in | Wt 178.4 lb

## 2023-06-26 DIAGNOSIS — I214 Non-ST elevation (NSTEMI) myocardial infarction: Secondary | ICD-10-CM

## 2023-06-26 NOTE — Progress Notes (Signed)
 Established Patient Office Visit  Subjective   Patient ID: Don Fletcher, male    DOB: 09/06/1970  Age: 53 y.o. MRN: [id number]  Chief Complaint  Patient presents with   Hospitalization Follow-up    Hospital follow up    Mr. Milliman presents today for hospital follow-up.  Hospital admission 5/4 - 5/6 in the setting of NSTEMI.  He presented to [facility] 5/4 endorsing chest pain, meeting criteria for NSTEMI, and was transferred to [facility].  He underwent LHC, which revealed severe three-vessel coronary disease.  Culprit lesion was suspected to be a distal OM branch, not amenable to intervention.  He was discharged on ASA, Plavix , statin, Zetia , and metoprolol .  Mr. Archambeau reports feeling well today.  He has not experienced chest pain since hospital discharge.  He is otherwise asymptomatic and has no acute concerns to discuss today.  Past Medical History:  Diagnosis Date   Arthritis    Bipolar 1 disorder (HCC)    CAD (coronary artery disease)    a. 12/16/13 inferolat STEMI s/p BMS to RPL. b. Cath 08/2014: interim occlusion of the PLA stent of the RCA, collateralized from the left coronary artery with diffuse nonobstructive CAD;  c. STEMI/PCI: LM nl, LAD nl, LCX 167m/d (2.75x12 synergy DES), RCA 30p, RPDA 75ost/p, RPAV 100 w/ L->R collats.   Chronic back pain    GERD (gastroesophageal reflux disease)    HLD (hyperlipidemia)    Hypertension    Ischemic cardiomyopathy    a. 12/2013: EF 50-55% by echo with +WMA. b. EF 45% by cath in 08/2014.   PAD (peripheral artery disease) (HCC)    s/p drug-coated balloon angioplasty right SFA, angioplasty/DES right EIA 09/09/21   Peptic ulcer disease    Schizophrenic disorder (HCC)    Tobacco abuse    Past Surgical History:  Procedure Laterality Date   ABDOMINAL AORTOGRAM W/LOWER EXTREMITY Bilateral 09/09/2021   Procedure: ABDOMINAL AORTOGRAM W/LOWER EXTREMITY;  Surgeon: Dannis Dy, MD;  Location: [facility] INVASIVE CV LAB;  Service:  Cardiovascular;  Laterality: Bilateral;   CARDIAC CATHETERIZATION N/A 08/07/2014   Procedure: Left Heart Cath and Coronary Angiography;  Surgeon: Arnoldo Lapping, MD;  Location: [facility] INVASIVE CV LAB;  Service: Cardiovascular;  Laterality: N/A;   COLONOSCOPY N/A 06/06/2017   Procedure: COLONOSCOPY;  Surgeon: Ruby Corporal, MD;  Location: [facility] ENDO SUITE;  Service: Endoscopy;  Laterality: N/A;  10:50   CORONARY STENT INTERVENTION N/A 05/19/2016   Procedure: Coronary Stent Intervention;  Surgeon: Avanell Leigh, MD;  Location: [facility] INVASIVE CV LAB;  Service: Cardiovascular;  Laterality: N/A;   CORONARY STENT INTERVENTION N/A 07/02/2018   Procedure: CORONARY STENT INTERVENTION;  Surgeon: Arty Binning, MD;  Location: [facility] INVASIVE CV LAB;  Service: Cardiovascular;  Laterality: N/A;   HERNIA REPAIR     LEFT HEART CATH AND CORONARY ANGIOGRAPHY N/A 05/19/2016   Procedure: Left Heart Cath and Coronary Angiography;  Surgeon: Avanell Leigh, MD;  Location: [facility] INVASIVE CV LAB;  Service: Cardiovascular;  Laterality: N/A;   LEFT HEART CATH AND CORONARY ANGIOGRAPHY N/A 07/02/2018   Procedure: LEFT HEART CATH AND CORONARY ANGIOGRAPHY;  Surgeon: Arty Binning, MD;  Location: [facility] INVASIVE CV LAB;  Service: Cardiovascular;  Laterality: N/A;   LEFT HEART CATH AND CORONARY ANGIOGRAPHY N/A 12/21/2021   Procedure: LEFT HEART CATH AND CORONARY ANGIOGRAPHY;  Surgeon: Arleen Lacer, MD;  Location: [facility] INVASIVE CV LAB;  Service: Cardiovascular;  Laterality: N/A;   LEFT HEART CATHETERIZATION WITH CORONARY ANGIOGRAM  N/A 12/16/2013   Procedure: LEFT HEART CATHETERIZATION WITH CORONARY ANGIOGRAM;  Surgeon: Lucendia Rusk, MD;  Location: [facility] CATH LAB;  Service: Cardiovascular;  Laterality: N/A;   PERCUTANEOUS CORONARY STENT INTERVENTION (PCI-S)  12/16/2013   Procedure: PERCUTANEOUS CORONARY STENT INTERVENTION (PCI-S);  Surgeon: Lucendia Rusk, MD;  Location: [facility] CATH LAB;  Service: Cardiovascular;;  distal rca    PERIPHERAL VASCULAR BALLOON ANGIOPLASTY  09/09/2021   Procedure: PERIPHERAL VASCULAR BALLOON ANGIOPLASTY;  Surgeon: Dannis Dy, MD;  Location: [facility] INVASIVE CV LAB;  Service: Cardiovascular;;  Rt SFA   PERIPHERAL VASCULAR INTERVENTION  09/09/2021   Procedure: PERIPHERAL VASCULAR INTERVENTION;  Surgeon: Dannis Dy, MD;  Location: [facility] INVASIVE CV LAB;  Service: Cardiovascular;;  Rt Iliac   TONSILLECTOMY     TOTAL HIP ARTHROPLASTY Right 03/15/2022   Procedure: RIGHT TOTAL HIP ARTHROPLASTY ANTERIOR APPROACH;  Surgeon: Adah Acron, MD;  Location: [facility] OR;  Service: Orthopedics;  Laterality: Right;  Needs RNFA   Social History   Tobacco Use   Smoking status: Every Day    Current packs/day: 1.00    Average packs/day: 1 pack/day for 35.1 years (35.1 ttl pk-yrs)    Types: Cigarettes, Cigars    Start date: 05/25/1988   Smokeless tobacco: Former    Types: Snuff    Quit date: 05/21/1988  Vaping Use   Vaping status: Never Used  Substance Use Topics   Alcohol use: No    Alcohol/week: 0.0 standard drinks of alcohol   Drug use: Yes    Types: Marijuana    Comment: last used today   Family History  Problem Relation Age of Onset   Heart attack Father 40   Heart attack Brother 40   Allergies  Allergen Reactions   Suboxone [Buprenorphine Hcl-Naloxone Hcl] Other (See Comments)    Caused kidney failure   Benadryl [Diphenhydramine] Other (See Comments)    LEG CRAMPING   Seroquel [Quetiapine] Other (See Comments)    "makes my legs cramp"   Review of Systems  Constitutional:  Negative for chills and fever.  HENT:  Negative for sore throat.   Respiratory:  Negative for cough and shortness of breath.   Cardiovascular:  Negative for chest pain, palpitations and leg swelling.  Gastrointestinal:  Negative for abdominal pain, blood in stool, constipation, diarrhea, nausea and vomiting.  Genitourinary:  Negative for dysuria and hematuria.  Musculoskeletal:  Negative for myalgias.   Skin:  Negative for itching and rash.  Neurological:  Negative for dizziness and headaches.  Psychiatric/Behavioral:  Negative for depression and suicidal ideas.      Objective:     BP 116/72   Pulse 71   Ht 5\' 7"  (1.702 m)   Wt 178 lb 6.4 oz (80.9 kg)   SpO2 97%   BMI 27.94 kg/m  BP Readings from Last 3 Encounters:  06/26/23 116/72  04/18/23 (!) 168/120  04/06/22 110/73   Physical Exam Vitals reviewed.  Constitutional:      General: He is not in acute distress.    Appearance: Normal appearance. He is not ill-appearing.  HENT:     Head: Normocephalic and atraumatic.     Right Ear: External ear normal.     Left Ear: External ear normal.     Nose: Nose normal. No congestion or rhinorrhea.     Mouth/Throat:     Mouth: Mucous membranes are moist.     Pharynx: Oropharynx is clear.  Eyes:     General: No scleral icterus.  Extraocular Movements: Extraocular movements intact.     Conjunctiva/sclera: Conjunctivae normal.     Pupils: Pupils are equal, round, and reactive to light.  Cardiovascular:     Rate and Rhythm: Normal rate and regular rhythm.     Pulses: Normal pulses.     Heart sounds: Normal heart sounds. No murmur heard. Pulmonary:     Effort: Pulmonary effort is normal.     Breath sounds: Normal breath sounds. No wheezing, rhonchi or rales.  Abdominal:     General: Abdomen is flat. Bowel sounds are normal. There is no distension.     Palpations: Abdomen is soft.     Tenderness: There is no abdominal tenderness.  Musculoskeletal:        General: No swelling or deformity. Normal range of motion.     Cervical back: Normal range of motion.  Skin:    General: Skin is warm and dry.     Capillary Refill: Capillary refill takes less than 2 seconds.  Neurological:     General: No focal deficit present.     Mental Status: He is alert and oriented to person, place, and time.     Motor: No weakness.  Psychiatric:        Mood and Affect: Mood normal.         Behavior: Behavior normal.        Thought Content: Thought content normal.   Last CBC Lab Results  Component Value Date   WBC 10.1 03/16/2022   HGB 14.6 03/16/2022   HCT 40.7 03/16/2022   MCV 90.2 03/16/2022   MCH 32.4 03/16/2022   RDW 13.0 03/16/2022   PLT 215 03/16/2022   Last metabolic panel Lab Results  Component Value Date   GLUCOSE 125 (H) 03/16/2022   NA 132 (L) 03/16/2022   K 4.1 03/16/2022   CL 100 03/16/2022   CO2 24 03/16/2022   BUN 14 03/16/2022   CREATININE 1.12 03/16/2022   GFRNONAA >60 03/16/2022   CALCIUM  8.4 (L) 03/16/2022   PROT 7.0 07/23/2020   ALBUMIN 3.8 07/23/2020   BILITOT 0.5 07/23/2020   ALKPHOS 81 07/23/2020   AST 24 07/23/2020   ALT 28 07/23/2020   ANIONGAP 8 03/16/2022   Last lipids Lab Results  Component Value Date   CHOL 219 (H) 09/10/2021   HDL 25 (L) 09/10/2021   LDLCALC 168 (H) 09/10/2021   TRIG 128 09/10/2021   CHOLHDL 8.8 09/10/2021   Last hemoglobin A1c Lab Results  Component Value Date   HGBA1C 5.6 09/29/2021   Last thyroid functions Lab Results  Component Value Date   TSH 1.310 09/29/2021   Last vitamin B12 and Folate Lab Results  Component Value Date   VITAMINB12 689 09/29/2021   FOLATE 2.5 (L) 09/29/2021     Assessment & Plan:   Problem List Items Addressed This Visit       NSTEMI (non-ST elevated myocardial infarction) [facility]) - Primary   Presenting today for hospital follow-up in the setting of recent admission at [facility] 5/4 - 5/6 in the setting of NSTEMI.  He has an extensive cardiac history notable for multiple MIs and PCI's.  He was off all medications prior to ER presentation on 5/4.  He underwent LHC, which revealed severe three-vessel disease with the culprit lesion suspected to be a distal OM branch not amenable to intervention.  Medical management was optimized.  He has been discharged on ASA 81 mg daily, Plavix , atorvastatin  80 mg daily, Zetia , and Toprol -XL 25 mg  daily.  He endorses compliance with this  regimen and brought all medications to his appointment today.  He was last evaluated by cardiology in January 2024.  We will contact their office today to arrange close follow-up.  Medication compliance was strongly encouraged.  Unfortunately, he has also resumed tobacco use after quitting for 5 days.  He is currently smoking 0.5 packs/day of cigarettes.  He did not like using nicotine  patches.  Additional nicotine  replacement therapy as well as other medication options for tobacco cessation were offered but he declined.  Will arrange primary care follow-up for 3 months.       Return in about 3 months (around 09/26/2023).    Tobi Fortes, MD

## 2023-06-26 NOTE — Assessment & Plan Note (Addendum)
 Presenting today for hospital follow-up in the setting of recent admission at [facility] 5/4 - 5/6 in the setting of NSTEMI.  He has an extensive cardiac history notable for multiple MIs and PCI's.  He was off all medications prior to ER presentation on 5/4.  He underwent LHC, which revealed severe three-vessel disease with the culprit lesion suspected to be a distal OM branch not amenable to intervention.  Medical management was optimized.  He has been discharged on ASA 81 mg daily, Plavix , atorvastatin  80 mg daily, Zetia , and Toprol -XL 25 mg daily.  He endorses compliance with this regimen and brought all medications to his appointment today.  He was last evaluated by cardiology in January 2024.  We will contact their office today to arrange close follow-up.  Medication compliance was strongly encouraged.  Unfortunately, he has also resumed tobacco use after quitting for 5 days.  He is currently smoking 0.5 packs/day of cigarettes.  He did not like using nicotine  patches.  Additional nicotine  replacement therapy as well as other medication options for tobacco cessation were offered but he declined.  Will arrange primary care follow-up for 3 months.

## 2023-06-26 NOTE — Patient Instructions (Signed)
 It was a pleasure to see you today.  Thank you for giving us  the opportunity to be involved in your care.  Below is a brief recap of your visit and next steps.  We will plan to see you again in 3 months.  Summary Hospital follow up completed today I recommend taking your medications as prescribed You are strongly encouraged to stop smoking cigarettes Will reach out to cardiology to arrange follow up Follow up in 3 months

## 2023-07-09 ENCOUNTER — Other Ambulatory Visit: Payer: Self-pay | Admitting: Internal Medicine

## 2023-07-09 DIAGNOSIS — I2510 Atherosclerotic heart disease of native coronary artery without angina pectoris: Secondary | ICD-10-CM

## 2023-07-09 DIAGNOSIS — E782 Mixed hyperlipidemia: Secondary | ICD-10-CM

## 2023-07-09 DIAGNOSIS — I2121 ST elevation (STEMI) myocardial infarction involving left circumflex coronary artery: Secondary | ICD-10-CM

## 2023-07-09 DIAGNOSIS — Z8249 Family history of ischemic heart disease and other diseases of the circulatory system: Secondary | ICD-10-CM

## 2023-07-09 MED ORDER — ATORVASTATIN CALCIUM 80 MG PO TABS: 80.0000 mg | ORAL_TABLET | Freq: Every day | ORAL | 0 refills | Status: DC

## 2023-07-09 MED ORDER — CLOPIDOGREL BISULFATE 75 MG PO TABS: 75.0000 mg | ORAL_TABLET | Freq: Every day | ORAL | 0 refills | Status: DC

## 2023-07-09 MED ORDER — EZETIMIBE 10 MG PO TABS: 10.0000 mg | ORAL_TABLET | Freq: Every day | ORAL | 11 refills | Status: AC

## 2023-07-09 MED ORDER — METOPROLOL SUCCINATE ER 25 MG PO TB24
25.0000 mg | ORAL_TABLET | Freq: Every day | ORAL | 0 refills | Status: DC
Start: 2023-07-09 — End: 2023-08-06

## 2023-07-09 NOTE — Telephone Encounter (Signed)
 Copied from CRM [id number]. Topic: Clinical - Medication Refill >> Jul 09, 2023 11:38 AM Elle L wrote: Medication: clopidogrel  (PLAVIX ) 75 MG tablet AND metoprolol  succinate (TOPROL -XL) 25 MG 24 hr tablet AND atorvastatin  (LIPITOR ) 80 MG tablet AND ezetimibe  (ZETIA ) 10 MG tablet   Has the patient contacted their pharmacy? Yes  This is the patient's preferred pharmacy:  [facility] [place], [place] - [address] [place] [address] Phone: [phone] Fax: [phone]  Is this the correct pharmacy for this prescription? Yes  Has the prescription been filled recently? Yes  Is the patient out of the medication? No, has one week left.   Has the patient been seen for an appointment in the last year OR does the patient have an upcoming appointment? Yes  Can we respond through MyChart? Yes  Agent: Please be advised that Rx refills may take up to 3 business days. We ask that you follow-up with your pharmacy.

## 2023-07-23 ENCOUNTER — Ambulatory Visit: Payer: Self-pay

## 2023-07-23 NOTE — Telephone Encounter (Signed)
  FYI Only or Action Required?: FYI only for provider  Patient was last seen in primary care on 06/26/2023 by Tobi Fortes, MD. Called Nurse Triage reporting Headache. Symptoms began a week ago. Interventions attempted: OTC medications: tylenol . Symptoms are: gradually worsening.  Triage Disposition: Go to ED or PCP/Alternative with Approval  Patient/caregiver understands and will follow disposition?: Yes      Summary: bad headaches   Copied From CRM [id number]. Reason for Triage: Pt is having bad headaches , been going on for the last few days and nothing is helping          Reason for Disposition  [1] SEVERE headache (e.g., excruciating) AND [2] worst headache of life  Answer Assessment - Initial Assessment Questions 1. LOCATION: Where does it hurt?      Entire head 2. ONSET: When did the headache start? (Minutes, hours or days)      10 days ago 3. PATTERN: Does the pain come and go, or has it been constant since it started?     Constant, throbbing, not relieved by OTC pain meds 4. SEVERITY: How bad is the pain? and What does it keep you from doing?  (e.g., Scale 1-10; mild, moderate, or severe)   - MILD (1-3): doesn't interfere with normal activities    - MODERATE (4-7): interferes with normal activities or awakens from sleep    - SEVERE (8-10): excruciating pain, unable to do any normal activities        9/10, worsening daily, worse with eyes closed 5. RECURRENT SYMPTOM: Have you ever had headaches before? If Yes, ask: When was the last time? and What happened that time?      No, denies 6. CAUSE: What do you think is causing the headache?     Unsure, recent heart attack 4-5 weeks ago and has started new medicine 7. MIGRAINE: Have you been diagnosed with migraine headaches? If Yes, ask: Is this headache similar?      denies 8. HEAD INJURY: Has there been any recent injury to the head?      No, denies 9. OTHER SYMPTOMS: Do you have any other  symptoms? (fever, stiff neck, eye pain, sore throat, cold symptoms) If I barely shake my head, my head feels like jello, dizziness at times Patient states that he had heart attack 4-5 weeks ago Denies chest pain or any weakness Denies any congestion symptoms  Protocols used: Headache-A-AH

## 2023-07-23 NOTE — Telephone Encounter (Signed)
 Called patient to schedule and in office visit today and the Patient is in the emergency, said a nurse called and spoke to him and told him to go to the ER.

## 2023-08-06 ENCOUNTER — Ambulatory Visit: Payer: MEDICAID

## 2023-08-06 VITALS — BP 148/84 | HR 64 | Ht 66.0 in | Wt 175.0 lb

## 2023-08-06 DIAGNOSIS — I2510 Atherosclerotic heart disease of native coronary artery without angina pectoris: Secondary | ICD-10-CM

## 2023-08-06 DIAGNOSIS — M26621 Arthralgia of right temporomandibular joint: Secondary | ICD-10-CM

## 2023-08-06 DIAGNOSIS — M1611 Unilateral primary osteoarthritis, right hip: Secondary | ICD-10-CM

## 2023-08-06 DIAGNOSIS — I10 Essential (primary) hypertension: Secondary | ICD-10-CM

## 2023-08-06 MED ORDER — CLOPIDOGREL BISULFATE 75 MG PO TABS: 75.0000 mg | ORAL_TABLET | Freq: Every day | ORAL | 2 refills | Status: AC

## 2023-08-06 MED ORDER — METOPROLOL SUCCINATE ER 25 MG PO TB24: 25.0000 mg | ORAL_TABLET | Freq: Every day | ORAL | 0 refills | Status: DC

## 2023-08-06 MED ORDER — TIZANIDINE HCL 4 MG PO TABS: 4.0000 mg | ORAL_TABLET | Freq: Every day | ORAL | 2 refills | Status: DC

## 2023-08-06 MED ORDER — TRAMADOL HCL 50 MG PO TABS: 50.0000 mg | ORAL_TABLET | Freq: Two times a day (BID) | ORAL | 2 refills | Status: AC | PRN

## 2023-08-06 NOTE — Progress Notes (Unsigned)
 Established Patient Office Visit  Subjective   Patient ID: Don Fletcher, male    DOB: 06/29/1970  Age: 53 y.o. MRN: [id number]  Chief Complaint  Patient presents with   Hospitalization Follow-up    HPI  Patient was seen at [facility] ED on 07/23/23 for the following issues:   Chief Complaint  Patient presents with  Headache Recurrent or Known Dx Migraines  Near Syncope   HPI  Don Fletcher is a 53 y.o. male who presents today to the emergency department complaining of gradual onset throbbing headache for the last week. Patient states has had pain for the past week or so, waxing and waning. Pain is slowly gotten progressively worse. The pain is mostly in his temples bilaterally. Pain is worse with movement and palpation. He denies any trauma. He denies any fever or chills. He does Intermittent dizziness associated with the pain. He said that he nearly passed out yesterday. Denies any chest pain    Patient Active Problem List   Diagnosis Date Noted   Status post total hip replacement, right 03/15/2022   Abnormal nuclear stress test 12/21/2021   Preop cardiovascular exam 12/21/2021   Lung nodule seen on imaging study 12/16/2021   GERD (gastroesophageal reflux disease) 12/16/2021   Folate deficiency 11/16/2021   Left shoulder pain 11/16/2021   Positive screening for depression on 9-item Patient Health Questionnaire (PHQ-9) 09/29/2021   Preventative health care 09/29/2021   Insomnia 09/29/2021   PAD (peripheral artery disease) (HCC) 09/09/2021   Unilateral primary osteoarthritis, right hip 07/21/2021   Encounter for smoking cessation counseling 03/04/2020   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   Cervical stenosis of spine 01/14/2020   Other headache syndrome 01/01/2020   Abnormal CT of the chest 01/01/2020   Syncope and collapse 12/19/2019   Bronchitis 12/19/2019   NSTEMI (non-ST elevated myocardial infarction) (HCC) 06/30/2018   Family hx of colon cancer 02/14/2017    STEMI (ST elevation myocardial infarction) (HCC) 05/19/2016   Acute ST elevation myocardial infarction (STEMI) involving left circumflex coronary artery without development of Q waves (HCC) 05/19/2016   Ischemic cardiomyopathy 05/19/2016   Unstable angina (HCC) 08/06/2014   ST elevation myocardial infarction involving right coronary artery (HCC)    CAD (coronary artery disease)    Tobacco abuse    HLD (hyperlipidemia)    Hypertension    Family history of premature CAD 12/16/2013   CAD S/P PCI:  2.0 x 23 vision bare metal stent (2.25 mm) RPL 12/16/2013    Class: Acute   Schizophrenic disorder (HCC)    Bipolar 1 disorder (HCC)    Chest pain 06/24/2013      ROS    Objective:     BP (!) 148/84   Pulse 64   Ht 5' 6 (1.676 m)   Wt 175 lb 0.6 oz (79.4 kg)   SpO2 94%   BMI 28.25 kg/m  BP Readings from Last 3 Encounters:  08/06/23 (!) 148/84  06/26/23 116/72  04/18/23 (!) 168/120   Wt Readings from Last 3 Encounters:  08/06/23 175 lb 0.6 oz (79.4 kg)  06/26/23 178 lb 6.4 oz (80.9 kg)  04/18/23 187 lb (84.8 kg)     Physical Exam   No results found for any visits on 08/06/23.  Last CBC Lab Results  Component Value Date   WBC 10.1 03/16/2022   HGB 14.6 03/16/2022   HCT 40.7 03/16/2022   MCV 90.2 03/16/2022   MCH 32.4 03/16/2022   RDW 13.0 03/16/2022  PLT 215 03/16/2022   Last metabolic panel Lab Results  Component Value Date   GLUCOSE 125 (H) 03/16/2022   NA 132 (L) 03/16/2022   K 4.1 03/16/2022   CL 100 03/16/2022   CO2 24 03/16/2022   BUN 14 03/16/2022   CREATININE 1.12 03/16/2022   GFRNONAA >60 03/16/2022   CALCIUM  8.4 (L) 03/16/2022   PROT 7.0 07/23/2020   ALBUMIN 3.8 07/23/2020   BILITOT 0.5 07/23/2020   ALKPHOS 81 07/23/2020   AST 24 07/23/2020   ALT 28 07/23/2020   ANIONGAP 8 03/16/2022      The ASCVD Risk score (Arnett DK, et al., 2019) failed to calculate for the following reasons:   Risk score cannot be calculated because patient has  a medical history suggesting prior/existing ASCVD    Assessment & Plan:   Problem List Items Addressed This Visit   None   No follow-ups on file.    Leita Longs, FNP

## 2023-08-07 DIAGNOSIS — M26621 Arthralgia of right temporomandibular joint: Secondary | ICD-10-CM | POA: Insufficient documentation

## 2023-08-07 NOTE — Assessment & Plan Note (Signed)
 Add Zanaflex for possible TMJ issues which may be the source of his headaches.

## 2023-08-07 NOTE — Assessment & Plan Note (Signed)
 Patient was seen for NSTEMI at [facility] on 06/12/23.  He had stent placed at that time and is on Plavix  and aspirin .  He does not have follow-up with cardiologist this time.  Will update referral to cardiology for follow-up care.

## 2023-08-07 NOTE — Assessment & Plan Note (Signed)
 S/p right hip THA with Dr. Barbarann on 2/7.  His postoperative course has been uncomplicated.  Agree to refill tramadol for as needed pain relief

## 2023-08-07 NOTE — Assessment & Plan Note (Signed)
BP remains well-controlled on benazepril 40 mg daily and carvedilol 6.25 mg twice daily. -No medication changes today

## 2023-08-15 ENCOUNTER — Other Ambulatory Visit: Payer: Self-pay

## 2023-08-15 ENCOUNTER — Encounter: Payer: Self-pay | Admitting: Vascular Surgery

## 2023-08-15 ENCOUNTER — Ambulatory Visit (HOSPITAL_COMMUNITY)
Admission: RE | Admit: 2023-08-15 | Discharge: 2023-08-15 | Disposition: A | Discharge status: Home / Self Care | Payer: MEDICAID | Source: Ambulatory Visit | Attending: Vascular Surgery | Admitting: Vascular Surgery

## 2023-08-15 ENCOUNTER — Ambulatory Visit (HOSPITAL_BASED_OUTPATIENT_CLINIC_OR_DEPARTMENT_OTHER)
Admission: RE | Admit: 2023-08-15 | Discharge: 2023-08-15 | Disposition: A | Discharge status: Home / Self Care | Payer: MEDICAID | Source: Ambulatory Visit | Attending: Vascular Surgery | Admitting: Vascular Surgery

## 2023-08-15 ENCOUNTER — Ambulatory Visit (INDEPENDENT_AMBULATORY_CARE_PROVIDER_SITE_OTHER): Payer: MEDICAID | Admitting: Vascular Surgery

## 2023-08-15 VITALS — BP 133/78 | HR 58 | Temp 98.2°F | Ht 66.0 in | Wt 175.0 lb

## 2023-08-15 DIAGNOSIS — I739 Peripheral vascular disease, unspecified: Secondary | ICD-10-CM | POA: Diagnosis present

## 2023-08-15 DIAGNOSIS — I70211 Atherosclerosis of native arteries of extremities with intermittent claudication, right leg: Secondary | ICD-10-CM | POA: Diagnosis present

## 2023-08-15 DIAGNOSIS — I70213 Atherosclerosis of native arteries of extremities with intermittent claudication, bilateral legs: Secondary | ICD-10-CM

## 2023-08-15 LAB — VAS US ABI WITH/WO TBI
Left ABI: 0.99
Right ABI: 0.84

## 2023-08-15 NOTE — Progress Notes (Addendum)
 Patient ID: Don Fletcher, male   DOB: 03-14-1970, 53 y.o.   MRN: [id number]  Reason for Consult: Follow-up   Referred by Melvenia Manus BRAVO, MD  Subjective:     HPI:  Don Fletcher is a 53 y.o. male history of right external iliac artery stenting and SFA balloon angioplasty for short distance life limiting claudication.  He now has persistent pain in the right leg even at very short distances.  He states that he had cramping of his entire right lower extremity upon walking into the office today.  He remains on aspirin , Plavix  and a statin.  He has been smoking a little bit more but mostly chews on cigarettes and cigars and does not smoke all that often and also no longer uses drugs or alcohol. Denies tissue loss or ulceration.  Past Medical History:  Diagnosis Date   Arthritis    Bipolar 1 disorder (HCC)    CAD (coronary artery disease)    a. 12/16/13 inferolat STEMI s/p BMS to RPL. b. Cath 08/2014: interim occlusion of the PLA stent of the RCA, collateralized from the left coronary artery with diffuse nonobstructive CAD;  c. STEMI/PCI: LM nl, LAD nl, LCX 15m/d (2.75x12 synergy DES), RCA 30p, RPDA 75ost/p, RPAV 100 w/ L->R collats.   Chronic back pain    GERD (gastroesophageal reflux disease)    HLD (hyperlipidemia)    Hypertension    Ischemic cardiomyopathy    a. 12/2013: EF 50-55% by echo with +WMA. b. EF 45% by cath in 08/2014.   PAD (peripheral artery disease) (HCC)    s/p drug-coated balloon angioplasty right SFA, angioplasty/DES right EIA 09/09/21   Peptic ulcer disease    Schizophrenic disorder (HCC)    Tobacco abuse    Family History  Problem Relation Age of Onset   Heart attack Father 70   Heart attack Brother 35   Past Surgical History:  Procedure Laterality Date   ABDOMINAL AORTOGRAM W/LOWER EXTREMITY Bilateral 09/09/2021   Procedure: ABDOMINAL AORTOGRAM W/LOWER EXTREMITY;  Surgeon: Eliza Lonni RAMAN, MD;  Location: [facility] INVASIVE CV LAB;  Service: Cardiovascular;   Laterality: Bilateral;   CARDIAC CATHETERIZATION N/A 08/07/2014   Procedure: Left Heart Cath and Coronary Angiography;  Surgeon: Ozell Fell, MD;  Location: [facility] INVASIVE CV LAB;  Service: Cardiovascular;  Laterality: N/A;   COLONOSCOPY N/A 06/06/2017   Procedure: COLONOSCOPY;  Surgeon: Golda Claudis PENNER, MD;  Location: [facility] ENDO [facility]ITE;  Service: Endoscopy;  Laterality: N/A;  10:50   CORONARY STENT INTERVENTION N/A 05/19/2016   Procedure: Coronary Stent Intervention;  Surgeon: Dorn JINNY Lesches, MD;  Location: [facility] INVASIVE CV LAB;  Service: Cardiovascular;  Laterality: N/A;   CORONARY STENT INTERVENTION N/A 07/02/2018   Procedure: CORONARY STENT INTERVENTION;  Surgeon: Claudene Victory ORN, MD;  Location: [facility] INVASIVE CV LAB;  Service: Cardiovascular;  Laterality: N/A;   HERNIA REPAIR     LEFT HEART CATH AND CORONARY ANGIOGRAPHY N/A 05/19/2016   Procedure: Left Heart Cath and Coronary Angiography;  Surgeon: Dorn JINNY Lesches, MD;  Location: [facility] INVASIVE CV LAB;  Service: Cardiovascular;  Laterality: N/A;   LEFT HEART CATH AND CORONARY ANGIOGRAPHY N/A 07/02/2018   Procedure: LEFT HEART CATH AND CORONARY ANGIOGRAPHY;  Surgeon: Claudene Victory ORN, MD;  Location: [facility] INVASIVE CV LAB;  Service: Cardiovascular;  Laterality: N/A;   LEFT HEART CATH AND CORONARY ANGIOGRAPHY N/A 12/21/2021   Procedure: LEFT HEART CATH AND CORONARY ANGIOGRAPHY;  Surgeon: Anner Alm ORN, MD;  Location: [facility] INVASIVE CV LAB;  Service: Cardiovascular;  Laterality: N/A;   LEFT HEART CATHETERIZATION WITH CORONARY ANGIOGRAM N/A 12/16/2013   Procedure: LEFT HEART CATHETERIZATION WITH CORONARY ANGIOGRAM;  Surgeon: Candyce GORMAN Reek, MD;  Location[facility] CATH LAB;  Service: Cardiovascular;  Laterality: N/A;   PERCUTANEOUS CORONARY STENT INTERVENTION (PCI-S)  12/16/2013   Procedure: PERCUTANEOUS CORONARY STENT INTERVENTION (PCI-S);  Surgeon: Candyce GORMAN Reek, MD;  Location: [facility] CATH LAB;  Service: Cardiovascular;;  distal rca   PERIPHERAL VASCULAR  BALLOON ANGIOPLASTY  09/09/2021   Procedure: PERIPHERAL VASCULAR BALLOON ANGIOPLASTY;  Surgeon: Eliza Lonni GORMAN, MD;  Location: [facility] INVASIVE CV LAB;  Service: Cardiovascular;;  Rt SFA   PERIPHERAL VASCULAR INTERVENTION  09/09/2021   Procedure: PERIPHERAL VASCULAR INTERVENTION;  Surgeon: Eliza Lonni GORMAN, MD;  Location: [facility] INVASIVE CV LAB;  Service: Cardiovascular;;  Rt Iliac   TONSILLECTOMY     TOTAL HIP ARTHROPLASTY Right 03/15/2022   Procedure: RIGHT TOTAL HIP ARTHROPLASTY ANTERIOR APPROACH;  Surgeon: Barbarann Oneil BROCKS, MD;  Location: [facility] OR;  Service: Orthopedics;  Laterality: Right;  Needs RNFA    Short Social History:  Social History   Tobacco Use   Smoking status: Every Day    Current packs/day: 1.00    Average packs/day: 1 pack/day for 35.2 years (35.2 ttl pk-yrs)    Types: Cigarettes, Cigars    Start date: 05/25/1988   Smokeless tobacco: Former    Types: Snuff    Quit date: 05/21/1988  Substance Use Topics   Alcohol use: No    Alcohol/week: 0.0 standard drinks of alcohol    Allergies  Allergen Reactions   Suboxone [Buprenorphine Hcl-Naloxone Hcl] Other (See Comments)    Caused kidney failure   Benadryl [Diphenhydramine] Other (See Comments)    LEG CRAMPING   Seroquel [Quetiapine] Other (See Comments)    makes my legs cramp    Current Outpatient Medications  Medication Sig Dispense Refill   aspirin  EC 81 MG tablet Take 81 mg by mouth daily. Swallow whole.     atorvastatin  (LIPITOR ) 80 MG tablet Take 1 tablet (80 mg total) by mouth daily. 90 tablet 0   clopidogrel  (PLAVIX ) 75 MG tablet Take 1 tablet (75 mg total) by mouth daily. 30 tablet 2   ezetimibe  (ZETIA ) 10 MG tablet Take 1 tablet (10 mg total) by mouth daily. 30 tablet 11   metoprolol  succinate (TOPROL -XL) 25 MG 24 hr tablet Take 1 tablet (25 mg total) by mouth daily. 90 tablet 0   tiZANidine  (ZANAFLEX ) 4 MG tablet Take 1 tablet (4 mg total) by mouth at bedtime. 30 tablet 2   traMADol  (ULTRAM ) 50 MG  tablet Take 1 tablet (50 mg total) by mouth every 12 (twelve) hours as needed for severe pain (pain score 7-10). 30 tablet 2   No current facility-administered medications for this visit.    Review of Systems  Constitutional:  Constitutional negative. HENT: HENT negative.  Eyes: Eyes negative.  Cardiovascular: Positive for leg swelling.  GI: Gastrointestinal negative.  Musculoskeletal: Positive for leg pain.  Neurological: Neurological negative. Hematologic: Hematologic/lymphatic negative.  Psychiatric: Psychiatric negative.        Objective:  Objective   Vitals:   08/15/23 1455  BP: 133/78  Pulse: (!) 58  Temp: 98.2 F (36.8 C)  SpO2: 97%  Weight: 175 lb (79.4 kg)  Height: 5' 6 (1.676 m)   Body mass index is 28.25 kg/m.  Physical Exam HENT:     Head: Normocephalic.     Nose: Nose normal.     Mouth/Throat:  Mouth: Mucous membranes are moist.  Eyes:     Pupils: Pupils are equal, round, and reactive to light.  Cardiovascular:     Rate and Rhythm: Normal rate.     Pulses:          Femoral pulses are 0 on the right side and 2+ on the left side.      Popliteal pulses are 0 on the right side and 1+ on the left side.       Posterior tibial pulses are 1+ on the left side.  Pulmonary:     Effort: Pulmonary effort is normal.  Abdominal:     General: Abdomen is flat.  Musculoskeletal:     Cervical back: Normal range of motion.  Neurological:     Mental Status: He is alert.     Data: RIGHT      PSV cm/sRatioStenosis       Waveform  Comments  +-----------+--------+-----+---------------+----------+--------+  CFA Distal 96                          monophasic          +-----------+--------+-----+---------------+----------+--------+  DFA       118                         biphasic            +-----------+--------+-----+---------------+----------+--------+  SFA Prox   75                          biphasic             +-----------+--------+-----+---------------+----------+--------+  SFA Mid    273          50-74% stenosisbiphasic            +-----------+--------+-----+---------------+----------+--------+  SFA Distal 61                          triphasic           +-----------+--------+-----+---------------+----------+--------+  POP Prox   55                          biphasic            +-----------+--------+-----+---------------+----------+--------+  POP Distal 41                          triphasic           +-----------+--------+-----+---------------+----------+--------+  TP Trunk   33                          biphasic            +-----------+--------+-----+---------------+----------+--------+  ATA Distal 20                          biphasic            +-----------+--------+-----+---------------+----------+--------+  PTA Distal 43                          biphasic            +-----------+--------+-----+---------------+----------+--------+  PERO Distal20  biphasic            +-----------+--------+-----+---------------+----------+--------+       Right Stent(s):  +---------------+--------+---------------+--------+--------+  EIA           PSV cm/sStenosis       WaveformComments  +---------------+--------+---------------+--------+--------+  Prox to Stent  113                                      +---------------+--------+---------------+--------+--------+  Proximal Stent 302     50-99% stenosis                  +---------------+--------+---------------+--------+--------+  Mid Stent      456     50-99% stenosis                  +---------------+--------+---------------+--------+--------+  Distal Stent   280     50-99% stenosis                  +---------------+--------+---------------+--------+--------+  Distal to [id number]                                       +---------------+--------+---------------+--------+--------+                Summary:  Right: 50-99% stenosis noted in the EIA stent. 50-74% stenosis noted in  the mid SFA.   ABI Findings:  +---------+------------------+-----+--------+--------+  Right   Rt Pressure (mmHg)IndexWaveformComment   +---------+------------------+-----+--------+--------+  Brachial 120                                      +---------+------------------+-----+--------+--------+  PTA     113               0.84 biphasic          +---------+------------------+-----+--------+--------+  DP      76                0.56 biphasic          +---------+------------------+-----+--------+--------+  Great Toe83                0.61                   +---------+------------------+-----+--------+--------+   +---------+------------------+-----+---------+-------+  Left    Lt Pressure (mmHg)IndexWaveform Comment  +---------+------------------+-----+---------+-------+  Brachial 135                                      +---------+------------------+-----+---------+-------+  PTA     134               0.99 triphasic         +---------+------------------+-----+---------+-------+  DP      111               0.82 biphasic          +---------+------------------+-----+---------+-------+  Great Toe105               0.78                   +---------+------------------+-----+---------+-------+   +-------+-----------+-----------+------------+------------+  ABI/TBIToday's ABIToday's TBIPrevious ABIPrevious TBI  +-------+-----------+-----------+------------+------------+  Right 0.84       0.61  0.72        0.70          +-------+-----------+-----------+------------+------------+  Left  0.99       0.78       1.01        0.82          +-------+-----------+-----------+------------+------------+       Summary:  Right: Resting right ankle-brachial  index indicates mild right lower  extremity arterial disease. The right toe-brachial index is abnormal.   Left: Resting left ankle-brachial index is within normal range. The left  toe-brachial index is normal.       Assessment/Plan:    53 year old male with history as above now with recurrent right lower extremity claudication which is life-limiting.  I have discussed the need for complete cessation of tobacco products.  Given his severe symptoms we have discussed proceeding with angiography from the left common femoral approach.  I did discuss that if his suprainguinal stent is occluded along with occlusion of his SFA that he would likely require surgical intervention.  He demonstrates good understanding we will get him scheduled for Monday in the near future.   Don Fletcher has atherosclerosis of the native arteries of the Right lower extremities causing disabling claudication. The patient is on best medical therapy for peripheral arterial disease. The patient has been counseled about the risks of tobacco use in atherosclerotic disease. The patient has been counseled to abstain from any tobacco use. An aortogram with bilateral lower extremity runoff angiography and Right lower extremity intervention and is indicated to better evaluate the patient's lower extremity circulation. Based on the patient's clinical exam and non-invasive data, we anticipate an endovascular intervention in the terminal aortic, iliac, and femoropopliteal vessels. Stenting and/or athrectomy would be favored because of the improved primary patency of these interventions as compared to plain balloon angioplasty.     Annarae Macnair C. Sheree, MD [facility] Office: [phone] Pager: [phone]

## 2023-08-15 NOTE — H&P (View-Only) (Signed)
 Patient ID: Don Fletcher, male   DOB: 1970-12-16, 53 y.o.   MRN: [id number]  Reason for Consult: Follow-up   Referred by Melvenia Manus BRAVO, MD  Subjective:     HPI:  Don Fletcher is a 53 y.o. male history of right external iliac artery stenting and SFA balloon angioplasty for short distance life limiting claudication.  He now has persistent pain in the right leg even at very short distances.  He states that he had cramping of his entire right lower extremity upon walking into the office today.  He remains on aspirin , Plavix  and a statin.  He has been smoking a little bit more but mostly chews on cigarettes and cigars and does not smoke all that often and also no longer uses drugs or alcohol. Denies tissue loss or ulceration.  Past Medical History:  Diagnosis Date   Arthritis    Bipolar 1 disorder (HCC)    CAD (coronary artery disease)    a. 12/16/13 inferolat STEMI s/p BMS to RPL. b. Cath 08/2014: interim occlusion of the PLA stent of the RCA, collateralized from the left coronary artery with diffuse nonobstructive CAD;  c. STEMI/PCI: LM nl, LAD nl, LCX 151m/d (2.75x12 synergy DES), RCA 30p, RPDA 75ost/p, RPAV 100 w/ L->R collats.   Chronic back pain    GERD (gastroesophageal reflux disease)    HLD (hyperlipidemia)    Hypertension    Ischemic cardiomyopathy    a. 12/2013: EF 50-55% by echo with +WMA. b. EF 45% by cath in 08/2014.   PAD (peripheral artery disease) (HCC)    s/p drug-coated balloon angioplasty right SFA, angioplasty/DES right EIA 09/09/21   Peptic ulcer disease    Schizophrenic disorder (HCC)    Tobacco abuse    Family History  Problem Relation Age of Onset   Heart attack Father 105   Heart attack Brother 22   Past Surgical History:  Procedure Laterality Date   ABDOMINAL AORTOGRAM W/LOWER EXTREMITY Bilateral 09/09/2021   Procedure: ABDOMINAL AORTOGRAM W/LOWER EXTREMITY;  Surgeon: Eliza Lonni RAMAN, MD;  Location: [facility] INVASIVE CV LAB;  Service: Cardiovascular;   Laterality: Bilateral;   CARDIAC CATHETERIZATION N/A 08/07/2014   Procedure: Left Heart Cath and Coronary Angiography;  Surgeon: Ozell Fell, MD;  Location: [facility] INVASIVE CV LAB;  Service: Cardiovascular;  Laterality: N/A;   COLONOSCOPY N/A 06/06/2017   Procedure: COLONOSCOPY;  Surgeon: Golda Claudis PENNER, MD;  Location: [facility] ENDO SUITE;  Service: Endoscopy;  Laterality: N/A;  10:50   CORONARY STENT INTERVENTION N/A 05/19/2016   Procedure: Coronary Stent Intervention;  Surgeon: Dorn JINNY Lesches, MD;  Location: [facility] INVASIVE CV LAB;  Service: Cardiovascular;  Laterality: N/A;   CORONARY STENT INTERVENTION N/A 07/02/2018   Procedure: CORONARY STENT INTERVENTION;  Surgeon: Claudene Victory ORN, MD;  Location: [facility] INVASIVE CV LAB;  Service: Cardiovascular;  Laterality: N/A;   HERNIA REPAIR     LEFT HEART CATH AND CORONARY ANGIOGRAPHY N/A 05/19/2016   Procedure: Left Heart Cath and Coronary Angiography;  Surgeon: Dorn JINNY Lesches, MD;  Location: [facility] INVASIVE CV LAB;  Service: Cardiovascular;  Laterality: N/A;   LEFT HEART CATH AND CORONARY ANGIOGRAPHY N/A 07/02/2018   Procedure: LEFT HEART CATH AND CORONARY ANGIOGRAPHY;  Surgeon: Claudene Victory ORN, MD;  Location: [facility] INVASIVE CV LAB;  Service: Cardiovascular;  Laterality: N/A;   LEFT HEART CATH AND CORONARY ANGIOGRAPHY N/A 12/21/2021   Procedure: LEFT HEART CATH AND CORONARY ANGIOGRAPHY;  Surgeon: Anner Alm ORN, MD;  Location: [facility] INVASIVE CV LAB;  Service: Cardiovascular;  Laterality: N/A;   LEFT HEART CATHETERIZATION WITH CORONARY ANGIOGRAM N/A 12/16/2013   Procedure: LEFT HEART CATHETERIZATION WITH CORONARY ANGIOGRAM;  Surgeon: Candyce GORMAN Reek, MD;  Location: [facility] CATH LAB;  Service: Cardiovascular;  Laterality: N/A;   PERCUTANEOUS CORONARY STENT INTERVENTION (PCI-S)  12/16/2013   Procedure: PERCUTANEOUS CORONARY STENT INTERVENTION (PCI-S);  Surgeon: Candyce GORMAN Reek, MD;  Location: [facility] CATH LAB;  Service: Cardiovascular;;  distal rca   PERIPHERAL VASCULAR  BALLOON ANGIOPLASTY  09/09/2021   Procedure: PERIPHERAL VASCULAR BALLOON ANGIOPLASTY;  Surgeon: Eliza Lonni GORMAN, MD;  Location: [facility] INVASIVE CV LAB;  Service: Cardiovascular;;  Rt SFA   PERIPHERAL VASCULAR INTERVENTION  09/09/2021   Procedure: PERIPHERAL VASCULAR INTERVENTION;  Surgeon: Eliza Lonni GORMAN, MD;  Location: [facility] INVASIVE CV LAB;  Service: Cardiovascular;;  Rt Iliac   TONSILLECTOMY     TOTAL HIP ARTHROPLASTY Right 03/15/2022   Procedure: RIGHT TOTAL HIP ARTHROPLASTY ANTERIOR APPROACH;  Surgeon: Barbarann Oneil BROCKS, MD;  Location: [facility] OR;  Service: Orthopedics;  Laterality: Right;  Needs RNFA    Short Social History:  Social History   Tobacco Use   Smoking status: Every Day    Current packs/day: 1.00    Average packs/day: 1 pack/day for 35.2 years (35.2 ttl pk-yrs)    Types: Cigarettes, Cigars    Start date: 05/25/1988   Smokeless tobacco: Former    Types: Snuff    Quit date: 05/21/1988  Substance Use Topics   Alcohol use: No    Alcohol/week: 0.0 standard drinks of alcohol    Allergies  Allergen Reactions   Suboxone [Buprenorphine Hcl-Naloxone Hcl] Other (See Comments)    Caused kidney failure   Benadryl [Diphenhydramine] Other (See Comments)    LEG CRAMPING   Seroquel [Quetiapine] Other (See Comments)    makes my legs cramp    Current Outpatient Medications  Medication Sig Dispense Refill   aspirin  EC 81 MG tablet Take 81 mg by mouth daily. Swallow whole.     atorvastatin  (LIPITOR ) 80 MG tablet Take 1 tablet (80 mg total) by mouth daily. 90 tablet 0   clopidogrel  (PLAVIX ) 75 MG tablet Take 1 tablet (75 mg total) by mouth daily. 30 tablet 2   ezetimibe  (ZETIA ) 10 MG tablet Take 1 tablet (10 mg total) by mouth daily. 30 tablet 11   metoprolol  succinate (TOPROL -XL) 25 MG 24 hr tablet Take 1 tablet (25 mg total) by mouth daily. 90 tablet 0   tiZANidine  (ZANAFLEX ) 4 MG tablet Take 1 tablet (4 mg total) by mouth at bedtime. 30 tablet 2   traMADol  (ULTRAM ) 50 MG  tablet Take 1 tablet (50 mg total) by mouth every 12 (twelve) hours as needed for severe pain (pain score 7-10). 30 tablet 2   No current facility-administered medications for this visit.    Review of Systems  Constitutional:  Constitutional negative. HENT: HENT negative.  Eyes: Eyes negative.  Cardiovascular: Positive for leg swelling.  GI: Gastrointestinal negative.  Musculoskeletal: Positive for leg pain.  Neurological: Neurological negative. Hematologic: Hematologic/lymphatic negative.  Psychiatric: Psychiatric negative.        Objective:  Objective   Vitals:   08/15/23 1455  BP: 133/78  Pulse: (!) 58  Temp: 98.2 F (36.8 C)  SpO2: 97%  Weight: 175 lb (79.4 kg)  Height: 5' 6 (1.676 m)   Body mass index is 28.25 kg/m.  Physical Exam HENT:     Head: Normocephalic.     Nose: Nose normal.     Mouth/Throat:  Mouth: Mucous membranes are moist.  Eyes:     Pupils: Pupils are equal, round, and reactive to light.  Cardiovascular:     Rate and Rhythm: Normal rate.     Pulses:          Femoral pulses are 0 on the right side and 2+ on the left side.      Popliteal pulses are 0 on the right side and 1+ on the left side.       Posterior tibial pulses are 1+ on the left side.  Pulmonary:     Effort: Pulmonary effort is normal.  Abdominal:     General: Abdomen is flat.  Musculoskeletal:     Cervical back: Normal range of motion.  Neurological:     Mental Status: He is alert.     Data: RIGHT      PSV cm/sRatioStenosis       Waveform  Comments  +-----------+--------+-----+---------------+----------+--------+  CFA Distal 96                          monophasic          +-----------+--------+-----+---------------+----------+--------+  DFA       118                         biphasic            +-----------+--------+-----+---------------+----------+--------+  SFA Prox   75                          biphasic             +-----------+--------+-----+---------------+----------+--------+  SFA Mid    273          50-74% stenosisbiphasic            +-----------+--------+-----+---------------+----------+--------+  SFA Distal 61                          triphasic           +-----------+--------+-----+---------------+----------+--------+  POP Prox   55                          biphasic            +-----------+--------+-----+---------------+----------+--------+  POP Distal 41                          triphasic           +-----------+--------+-----+---------------+----------+--------+  TP Trunk   33                          biphasic            +-----------+--------+-----+---------------+----------+--------+  ATA Distal 20                          biphasic            +-----------+--------+-----+---------------+----------+--------+  PTA Distal 43                          biphasic            +-----------+--------+-----+---------------+----------+--------+  PERO Distal20  biphasic            +-----------+--------+-----+---------------+----------+--------+       Right Stent(s):  +---------------+--------+---------------+--------+--------+  EIA           PSV cm/sStenosis       WaveformComments  +---------------+--------+---------------+--------+--------+  Prox to Stent  113                                      +---------------+--------+---------------+--------+--------+  Proximal Stent 302     50-99% stenosis                  +---------------+--------+---------------+--------+--------+  Mid Stent      456     50-99% stenosis                  +---------------+--------+---------------+--------+--------+  Distal Stent   280     50-99% stenosis                  +---------------+--------+---------------+--------+--------+  Distal to [id number]                                       +---------------+--------+---------------+--------+--------+                Summary:  Right: 50-99% stenosis noted in the EIA stent. 50-74% stenosis noted in  the mid SFA.   ABI Findings:  +---------+------------------+-----+--------+--------+  Right   Rt Pressure (mmHg)IndexWaveformComment   +---------+------------------+-----+--------+--------+  Brachial 120                                      +---------+------------------+-----+--------+--------+  PTA     113               0.84 biphasic          +---------+------------------+-----+--------+--------+  DP      76                0.56 biphasic          +---------+------------------+-----+--------+--------+  Great Toe83                0.61                   +---------+------------------+-----+--------+--------+   +---------+------------------+-----+---------+-------+  Left    Lt Pressure (mmHg)IndexWaveform Comment  +---------+------------------+-----+---------+-------+  Brachial 135                                      +---------+------------------+-----+---------+-------+  PTA     134               0.99 triphasic         +---------+------------------+-----+---------+-------+  DP      111               0.82 biphasic          +---------+------------------+-----+---------+-------+  Great Toe105               0.78                   +---------+------------------+-----+---------+-------+   +-------+-----------+-----------+------------+------------+  ABI/TBIToday's ABIToday's TBIPrevious ABIPrevious TBI  +-------+-----------+-----------+------------+------------+  Right 0.84       0.61  0.72        0.70          +-------+-----------+-----------+------------+------------+  Left  0.99       0.78       1.01        0.82          +-------+-----------+-----------+------------+------------+       Summary:  Right: Resting right ankle-brachial  index indicates mild right lower  extremity arterial disease. The right toe-brachial index is abnormal.   Left: Resting left ankle-brachial index is within normal range. The left  toe-brachial index is normal.       Assessment/Plan:    52 year old male with history as above now with recurrent right lower extremity claudication which is life-limiting.  I have discussed the need for complete cessation of tobacco products.  Given his severe symptoms we have discussed proceeding with angiography from the left common femoral approach.  I did discuss that if his suprainguinal stent is occluded along with occlusion of his SFA that he would likely require surgical intervention.  He demonstrates good understanding we will get him scheduled for Monday in the near future.   Don Fletcher has atherosclerosis of the native arteries of the Right lower extremities causing disabling claudication. The patient is on best medical therapy for peripheral arterial disease. The patient has been counseled about the risks of tobacco use in atherosclerotic disease. The patient has been counseled to abstain from any tobacco use. An aortogram with bilateral lower extremity runoff angiography and Right lower extremity intervention and is indicated to better evaluate the patient's lower extremity circulation. Based on the patient's clinical exam and non-invasive data, we anticipate an endovascular intervention in the terminal aortic, iliac, and femoropopliteal vessels. Stenting and/or athrectomy would be favored because of the improved primary patency of these interventions as compared to plain balloon angioplasty.     Endia Moncur C. Sheree, MD [facility] Office: [phone] Pager: [phone]

## 2023-08-20 ENCOUNTER — Ambulatory Visit (HOSPITAL_COMMUNITY)
Admission: RE | Admit: 2023-08-20 | Discharge: 2023-08-20 | Disposition: A | Discharge status: Home / Self Care | Payer: MEDICAID | Attending: Vascular Surgery | Admitting: Vascular Surgery

## 2023-08-20 ENCOUNTER — Encounter (HOSPITAL_COMMUNITY)
Admission: RE | Disposition: A | Discharge status: Home / Self Care | Payer: Self-pay | Source: Home / Self Care | Attending: Vascular Surgery

## 2023-08-20 ENCOUNTER — Other Ambulatory Visit: Payer: Self-pay

## 2023-08-20 DIAGNOSIS — I70211 Atherosclerosis of native arteries of extremities with intermittent claudication, right leg: Secondary | ICD-10-CM | POA: Diagnosis present

## 2023-08-20 DIAGNOSIS — Z7902 Long term (current) use of antithrombotics/antiplatelets: Secondary | ICD-10-CM | POA: Diagnosis not present

## 2023-08-20 DIAGNOSIS — Z79899 Other long term (current) drug therapy: Secondary | ICD-10-CM | POA: Diagnosis not present

## 2023-08-20 DIAGNOSIS — Z9889 Other specified postprocedural states: Secondary | ICD-10-CM | POA: Diagnosis not present

## 2023-08-20 DIAGNOSIS — Z7982 Long term (current) use of aspirin: Secondary | ICD-10-CM | POA: Diagnosis not present

## 2023-08-20 DIAGNOSIS — I70213 Atherosclerosis of native arteries of extremities with intermittent claudication, bilateral legs: Secondary | ICD-10-CM

## 2023-08-20 DIAGNOSIS — F17210 Nicotine dependence, cigarettes, uncomplicated: Secondary | ICD-10-CM | POA: Diagnosis not present

## 2023-08-20 DIAGNOSIS — T82856A Stenosis of peripheral vascular stent, initial encounter: Secondary | ICD-10-CM

## 2023-08-20 DIAGNOSIS — Z720 Tobacco use: Secondary | ICD-10-CM

## 2023-08-20 HISTORY — PX: LOWER EXTREMITY INTERVENTION: CATH118252

## 2023-08-20 HISTORY — PX: LOWER EXTREMITY ANGIOGRAPHY: CATH118251

## 2023-08-20 HISTORY — PX: ABDOMINAL AORTOGRAM: CATH118222

## 2023-08-20 LAB — POCT I-STAT, CHEM 8
BUN: 15 mg/dL (ref 6–20)
Calcium, Ion: 1.13 mmol/L — ABNORMAL LOW (ref 1.15–1.40)
Chloride: 105 mmol/L (ref 98–111)
Creatinine, Ser: 1 mg/dL (ref 0.61–1.24)
Glucose, Bld: 111 mg/dL — ABNORMAL HIGH (ref 70–99)
HCT: 49 % (ref 39.0–52.0)
Hemoglobin: 16.7 g/dL (ref 13.0–17.0)
Potassium: 4.4 mmol/L (ref 3.5–5.1)
Sodium: 140 mmol/L (ref 135–145)
TCO2: 22 mmol/L (ref 22–32)

## 2023-08-20 SURGERY — ABDOMINAL AORTOGRAM
Anesthesia: LOCAL | Laterality: Right

## 2023-08-20 MED ORDER — HEPARIN SODIUM (PORCINE) 1000 UNIT/ML IJ SOLN
INTRAMUSCULAR | Status: DC | PRN
  Administered 2023-08-20: 8000 [IU] via INTRAVENOUS

## 2023-08-20 MED ORDER — SODIUM CHLORIDE 0.9 % IV SOLN: INTRAVENOUS | Status: DC

## 2023-08-20 MED ORDER — MIDAZOLAM HCL 2 MG/2ML IJ SOLN
INTRAMUSCULAR | Status: DC | PRN
  Administered 2023-08-20: 1 mg via INTRAVENOUS

## 2023-08-20 MED ORDER — LIDOCAINE HCL (PF) 1 % IJ SOLN
INTRAMUSCULAR | Status: AC
  Filled 2023-08-20: qty 30

## 2023-08-20 MED ORDER — IODIXANOL 320 MG/ML IV SOLN
INTRAVENOUS | Status: DC | PRN
  Administered 2023-08-20: 100 mL

## 2023-08-20 MED ORDER — MIDAZOLAM HCL 2 MG/2ML IJ SOLN
INTRAMUSCULAR | Status: AC
  Filled 2023-08-20: qty 2

## 2023-08-20 MED ORDER — CLOPIDOGREL BISULFATE 75 MG PO TABS
ORAL_TABLET | ORAL | Status: AC
  Filled 2023-08-20: qty 1

## 2023-08-20 MED ORDER — CLOPIDOGREL BISULFATE 300 MG PO TABS
ORAL_TABLET | ORAL | Status: DC | PRN
  Administered 2023-08-20: 75 mg via ORAL

## 2023-08-20 MED ORDER — HEPARIN SODIUM (PORCINE) 1000 UNIT/ML IJ SOLN
INTRAMUSCULAR | Status: AC
  Filled 2023-08-20: qty 10

## 2023-08-20 MED ORDER — FENTANYL CITRATE (PF) 100 MCG/2ML IJ SOLN
INTRAMUSCULAR | Status: DC | PRN
  Administered 2023-08-20: 25 ug via INTRAVENOUS

## 2023-08-20 MED ORDER — LIDOCAINE HCL (PF) 1 % IJ SOLN
INTRAMUSCULAR | Status: DC | PRN
Start: 2023-08-20 — End: 2023-08-20
  Administered 2023-08-20: 15 mL via INTRADERMAL

## 2023-08-20 MED ORDER — HEPARIN (PORCINE) IN NACL 1000-0.9 UT/500ML-% IV SOLN
INTRAVENOUS | Status: DC | PRN
  Administered 2023-08-20 (×2): 500 mL

## 2023-08-20 MED ORDER — ASPIRIN 81 MG PO CHEW
CHEWABLE_TABLET | ORAL | Status: DC | PRN
Start: 2023-08-20 — End: 2023-08-20
  Administered 2023-08-20: 81 mg via ORAL

## 2023-08-20 MED ORDER — ASPIRIN 81 MG PO CHEW
CHEWABLE_TABLET | ORAL | Status: AC
  Filled 2023-08-20: qty 1

## 2023-08-20 MED ORDER — FENTANYL CITRATE (PF) 100 MCG/2ML IJ SOLN
INTRAMUSCULAR | Status: AC
  Filled 2023-08-20: qty 2

## 2023-08-20 SURGICAL SUPPLY — 21 items
BALLOON STERLING OTW 4X150X150 (BALLOONS) IMPLANT
BALLOON STERLING OTW 6X60X135 (BALLOONS) IMPLANT
CATH AURYON ATHREC 1.7 (CATHETERS) IMPLANT
CATH OMNI FLUSH 5F 65CM (CATHETERS) IMPLANT
CATH QUICKCROSS SUPP .018X90CM (MICROCATHETER) IMPLANT
CLOSURE MYNX CONTROL 6F/7F (Vascular Products) IMPLANT
COVER DOME SNAP 22 D (MISCELLANEOUS) IMPLANT
DCB RANGER 4.0X150 150 (BALLOONS) IMPLANT
DCB RANGER 6.0X60 135 (BALLOONS) IMPLANT
DEVICE CLOSURE MYNXGRIP 6/7F (Vascular Products) IMPLANT
KIT ENCORE 26 ADVANTAGE (KITS) IMPLANT
KIT MICROPUNCTURE NIT STIFF (SHEATH) IMPLANT
SET ATX-X65L (MISCELLANEOUS) IMPLANT
SHEATH CATAPULT 6FR 45 (SHEATH) IMPLANT
SHEATH PINNACLE 5F 10CM (SHEATH) IMPLANT
SHEATH PINNACLE 6F 10CM (SHEATH) IMPLANT
SHEATH PROBE COVER 6X72 (BAG) IMPLANT
STENT VIABAHN 7X39 6FR 80 (Permanent Stent) IMPLANT
TRAY PV CATH (CUSTOM PROCEDURE TRAY) ×2 IMPLANT
WIRE BENTSON .035X145CM (WIRE) IMPLANT
WIRE G V18X300CM (WIRE) IMPLANT

## 2023-08-20 NOTE — Op Note (Signed)
 Patient name: Don Fletcher MRN: [id number] DOB: 1970-05-16 Sex: male  08/20/2023 Pre-operative Diagnosis: Atherosclerosis native arteries with life-limiting right lower extremity claudication Post-operative diagnosis:  Same Surgeon:  Penne BROCKS. Sheree, MD Procedure Performed: 1.  Percutaneous ultrasound-guided cannulation and Mynx device closure left common femoral artery 2.  Catheter in aorta and aortogram with bilateral lower extremity angiography 3.  Selection of right popliteal artery with catheter 4.  Laser atherectomy and 4 mm drug coated balloon angioplasty right SFA with Ranger balloon and 1.7 mm Auryon laser 5.  Laser atherectomy and 6 mm drug-coated balloon angioplasty right external iliac artery in-stent restenosis with Ranger balloon and 1.7 mm Auryon laser 6.  Stent of right common iliac artery with 7 x 39 mm VBX 7.  Moderate sedation with fentanyl  and Versed  for 71 minutes   Indications: 53 year old male has a history of right external neck artery and superficial femoral artery intervention for claudication which has now returned.  He has been doing better from a smoking standpoint he is not acute for angiography with possible invention.  Findings: The right SFA had approximately 70% stenosis this was reduced to 0% after laser arthrectomy and drug-coated balloon angioplasty.  The right common femoral artery was patent the dominant runoff below the knee was the posterior tibial artery which was palpable at completion.  The aorta was patent with patent renal arteries.  The common iliac artery had calcifications that were mildly flow-limiting and had a 15 mm meter gradient across them and was stented to 0% residual stenosis.  The proximal stent in the external neck artery had an area of 70% stenosis with a gradient of 30 mmHg across it which was used 0% stenosis after drug-coated balloon angioplasty and laser arthrectomy.  A closure device was placed in the left common femoral artery  which was patent with patent proximal SFA and profunda.  The left common external neck arteries were patent.  The right hypogastric artery is subtotally occluded.   Procedure:  The patient was identified in the holding area and taken to room 8.  The patient was then placed supine on the table and prepped and draped in the usual sterile fashion.  A time out was called.  Ultrasound was used to evaluate the left common femoral artery which was noted to be patent.  The area was anesthetized 1% lidocaine  and cannulated with a micropuncture needle followed by wire and a sheath.  An ultrasound which was saved to the permanent record.  Concomitantly we administered fentanyl  and Versed  as moderate sedation as vital signs were monitored throughout the case by bedside nursing.  We placed the Bentson wire followed by 5 French sheath and Omni catheter was placed at the level of L1 aortogram was performed followed by pelvic angiography including the bilateral common femoral arteries as well as superficial femoral arteries and profunda.  We then crossed the bifurcation perform right lower extremity angiography with the above findings.  We then placed a long 6 French sheath patient was fully heparinized.  We used a V18 to cross the stenosis in the SFA confirmed intraluminal access and then perform laser arthrectomy of the SFA followed by plain balloon angioplasty followed by drug-coated balloon angioplasty at nominal pressure for 3 minutes and completion demonstrated no residual stenosis.  After this we retracted the sheath and did a pullback gradient across the extrailiac artery which was approximate 30 mmHg and the common iliac artery had a gradient of 15 mmHg.  We then performed  laser atherectomy of the right extrailiac artery in-stent restenosis followed by plain balloon angioplasty followed by drug-coated balloon angioplasty with 6 mm balloon at nominal pressure for 3 minutes and completion demonstrated no residual stenosis  and brisk flow through the stent.  We then primarily stented the right common iliac artery with a 7 x 39 mm VBX and this had a 0% residual stenosis as well with brisk flow to the common femoral artery which was palpable.  We then exchanged for a short 6 French sheath in the left groin and perform retrograde angiography and then deployed a minx device.  He tolerated procedure well without immediate complication and had a palpable right posterior tibial pulse at completion.  Contrast: 100 cc  Don Fletcher C. Sheree, MD [facility] Office: [phone] Pager: [phone]

## 2023-08-20 NOTE — Interval H&P Note (Signed)
 History and Physical Interval Note:  08/20/2023 9:41 AM  Don Fletcher  has presented today for surgery, with the diagnosis of atherosclerosis bilateral with intermitted claudication.  The various methods of treatment have been discussed with the patient and family. After consideration of risks, benefits and other options for treatment, the patient has consented to  Procedure(s): ABDOMINAL AORTOGRAM (N/A) Lower Extremity Angiography (N/A) LOWER EXTREMITY INTERVENTION (N/A) as a surgical intervention.  The patient's history has been reviewed, patient examined, no change in status, stable for surgery.  I have reviewed the patient's chart and labs.  Questions were answered to the patient's satisfaction.     [name]

## 2023-08-22 ENCOUNTER — Encounter (HOSPITAL_COMMUNITY): Payer: Self-pay | Admitting: Vascular Surgery

## 2023-09-03 ENCOUNTER — Other Ambulatory Visit: Payer: Self-pay | Admitting: *Deleted

## 2023-09-03 DIAGNOSIS — I739 Peripheral vascular disease, unspecified: Secondary | ICD-10-CM

## 2023-09-03 DIAGNOSIS — I70211 Atherosclerosis of native arteries of extremities with intermittent claudication, right leg: Secondary | ICD-10-CM

## 2023-09-03 DIAGNOSIS — I70213 Atherosclerosis of native arteries of extremities with intermittent claudication, bilateral legs: Secondary | ICD-10-CM

## 2023-09-19 ENCOUNTER — Ambulatory Visit (HOSPITAL_COMMUNITY)
Admission: RE | Admit: 2023-09-19 | Discharge: 2023-09-19 | Disposition: A | Discharge status: Home / Self Care | Payer: MEDICAID | Source: Ambulatory Visit | Attending: Vascular Surgery | Admitting: Vascular Surgery

## 2023-09-19 ENCOUNTER — Ambulatory Visit (INDEPENDENT_AMBULATORY_CARE_PROVIDER_SITE_OTHER): Payer: MEDICAID | Admitting: Physician Assistant

## 2023-09-19 ENCOUNTER — Ambulatory Visit (HOSPITAL_BASED_OUTPATIENT_CLINIC_OR_DEPARTMENT_OTHER)
Admit: 2023-09-19 | Discharge: 2023-09-19 | Disposition: A | Discharge status: Home / Self Care | Payer: MEDICAID | Attending: Vascular Surgery | Admitting: Vascular Surgery

## 2023-09-19 VITALS — BP 140/87 | HR 66 | Temp 97.8°F | Wt 176.1 lb

## 2023-09-19 DIAGNOSIS — I70211 Atherosclerosis of native arteries of extremities with intermittent claudication, right leg: Secondary | ICD-10-CM | POA: Insufficient documentation

## 2023-09-19 DIAGNOSIS — I70213 Atherosclerosis of native arteries of extremities with intermittent claudication, bilateral legs: Secondary | ICD-10-CM | POA: Insufficient documentation

## 2023-09-19 DIAGNOSIS — I739 Peripheral vascular disease, unspecified: Secondary | ICD-10-CM

## 2023-09-19 LAB — VAS US ABI WITH/WO TBI
Left ABI: 1.01
Right ABI: 0.9

## 2023-09-19 NOTE — Progress Notes (Signed)
 Office Note     CC:  follow up Requesting Provider:  Bevely Doffing, FNP  HPI: Don Fletcher is a 53 y.o. (13-Mar-1970) male who presents status post aortogram with right common iliac artery stenting, laser atherectomy and drug-coated balloon angioplasty of the right external iliac artery in-stent stenosis, and laser atherectomy and drug-coated balloon angioplasty of the right SFA by Dr. Sheree on 08/20/2023 due to lifestyle limiting claudication.  He reports soreness and calf cramping in the right leg however it is much improved compared to prior to surgery.  He is on aspirin , Plavix , statin daily.  He continues to smoke 1/2 pack a day.  He denies any tissue loss or rest pain in the right foot.   Past Medical History:  Diagnosis Date   Arthritis    Bipolar 1 disorder (HCC)    CAD (coronary artery disease)    a. 12/16/13 inferolat STEMI s/p BMS to RPL. b. Cath 08/2014: interim occlusion of the PLA stent of the RCA, collateralized from the left coronary artery with diffuse nonobstructive CAD;  c. STEMI/PCI: LM nl, LAD nl, LCX 188m/d (2.75x12 synergy DES), RCA 30p, RPDA 75ost/p, RPAV 100 w/ L->R collats.   Chronic back pain    GERD (gastroesophageal reflux disease)    HLD (hyperlipidemia)    Hypertension    Ischemic cardiomyopathy    a. 12/2013: EF 50-55% by echo with +WMA. b. EF 45% by cath in 08/2014.   PAD (peripheral artery disease) (HCC)    s/p drug-coated balloon angioplasty right SFA, angioplasty/DES right EIA 09/09/21   Peptic ulcer disease    Schizophrenic disorder (HCC)    Tobacco abuse     Past Surgical History:  Procedure Laterality Date   ABDOMINAL AORTOGRAM N/A 08/20/2023   Procedure: ABDOMINAL AORTOGRAM;  Surgeon: Sheree Penne Bruckner, MD;  Location: [facility] INVASIVE CV LAB;  Service: Cardiovascular;  Laterality: N/A;   ABDOMINAL AORTOGRAM W/LOWER EXTREMITY Bilateral 09/09/2021   Procedure: ABDOMINAL AORTOGRAM W/LOWER EXTREMITY;  Surgeon: Eliza Bruckner RAMAN, MD;  Location:  [facility] INVASIVE CV LAB;  Service: Cardiovascular;  Laterality: Bilateral;   CARDIAC CATHETERIZATION N/A 08/07/2014   Procedure: Left Heart Cath and Coronary Angiography;  Surgeon: Ozell Fell, MD;  Location: [facility] INVASIVE CV LAB;  Service: Cardiovascular;  Laterality: N/A;   COLONOSCOPY N/A 06/06/2017   Procedure: COLONOSCOPY;  Surgeon: Golda Claudis PENNER, MD;  Location: [facility] ENDO [facility];  Service: Endoscopy;  Laterality: N/A;  10:50   CORONARY STENT INTERVENTION N/A 05/19/2016   Procedure: Coronary Stent Intervention;  Surgeon: Dorn JINNY Lesches, MD;  Location: [facility] INVASIVE CV LAB;  Service: Cardiovascular;  Laterality: N/A;   CORONARY STENT INTERVENTION N/A 07/02/2018   Procedure: CORONARY STENT INTERVENTION;  Surgeon: Claudene Victory ORN, MD;  Location: [facility] INVASIVE CV LAB;  Service: Cardiovascular;  Laterality: N/A;   HERNIA REPAIR     LEFT HEART CATH AND CORONARY ANGIOGRAPHY N/A 05/19/2016   Procedure: Left Heart Cath and Coronary Angiography;  Surgeon: Dorn JINNY Lesches, MD;  Location: [facility] INVASIVE CV LAB;  Service: Cardiovascular;  Laterality: N/A;   LEFT HEART CATH AND CORONARY ANGIOGRAPHY N/A 07/02/2018   Procedure: LEFT HEART CATH AND CORONARY ANGIOGRAPHY;  Surgeon: Claudene Victory ORN, MD;  Location: [facility] INVASIVE CV LAB;  Service: Cardiovascular;  Laterality: N/A;   LEFT HEART CATH AND CORONARY ANGIOGRAPHY N/A 12/21/2021   Procedure: LEFT HEART CATH AND CORONARY ANGIOGRAPHY;  Surgeon: Anner Alm ORN, MD;  Location: [facility] INVASIVE CV LAB;  Service: Cardiovascular;  Laterality: N/A;   LEFT HEART  CATHETERIZATION WITH CORONARY ANGIOGRAM N/A 12/16/2013   Procedure: LEFT HEART CATHETERIZATION WITH CORONARY ANGIOGRAM;  Surgeon: Candyce GORMAN Reek, MD;  Location: [facility] CATH LAB;  Service: Cardiovascular;  Laterality: N/A;   LOWER EXTREMITY ANGIOGRAPHY Right 08/20/2023   Procedure: Lower Extremity Angiography;  Surgeon: Sheree Penne Bruckner, MD;  Location: [facility] INVASIVE CV LAB;  Service: Cardiovascular;  Laterality:  Right;   LOWER EXTREMITY INTERVENTION Right 08/20/2023   Procedure: LOWER EXTREMITY INTERVENTION;  Surgeon: Sheree Penne Bruckner, MD;  Location: [facility] INVASIVE CV LAB;  Service: Cardiovascular;  Laterality: Right;   PERCUTANEOUS CORONARY STENT INTERVENTION (PCI-S)  12/16/2013   Procedure: PERCUTANEOUS CORONARY STENT INTERVENTION (PCI-S);  Surgeon: Candyce GORMAN Reek, MD;  Location: [facility] CATH LAB;  Service: Cardiovascular;;  distal rca   PERIPHERAL VASCULAR BALLOON ANGIOPLASTY  09/09/2021   Procedure: PERIPHERAL VASCULAR BALLOON ANGIOPLASTY;  Surgeon: Eliza Bruckner GORMAN, MD;  Location: [facility] INVASIVE CV LAB;  Service: Cardiovascular;;  Rt SFA   PERIPHERAL VASCULAR INTERVENTION  09/09/2021   Procedure: PERIPHERAL VASCULAR INTERVENTION;  Surgeon: Eliza Bruckner GORMAN, MD;  Location: [facility] INVASIVE CV LAB;  Service: Cardiovascular;;  Rt Iliac   TONSILLECTOMY     TOTAL HIP ARTHROPLASTY Right 03/15/2022   Procedure: RIGHT TOTAL HIP ARTHROPLASTY ANTERIOR APPROACH;  Surgeon: Barbarann Oneil BROCKS, MD;  Location: [facility] OR;  Service: Orthopedics;  Laterality: Right;  Needs RNFA    Social History   Socioeconomic History   Marital status: Married    Spouse name: Not on file   Number of children: Not on file   Years of education: Not on file   Highest education level: Not on file  Occupational History   Not on file  Tobacco Use   Smoking status: Every Day    Current packs/day: 1.00    Average packs/day: 1 pack/day for 35.3 years (35.3 ttl pk-yrs)    Types: Cigarettes, Cigars    Start date: 05/25/1988   Smokeless tobacco: Former    Types: Snuff    Quit date: 05/21/1988  Vaping Use   Vaping status: Never Used  Substance and Sexual Activity   Alcohol use: No    Alcohol/week: 0.0 standard drinks of alcohol   Drug use: Yes    Types: Marijuana    Comment: last used today   Sexual activity: Not on file  Other Topics Concern   Not on file  Social History Narrative   Not on file   Social Drivers of Health    Financial Resource Strain: Low Risk  (06/12/2023)   Received from [facility]   Overall Financial Resource Strain (CARDIA)    Difficulty of Paying Living Expenses: Not very hard  Food Insecurity: No Food Insecurity (06/12/2023)   Received from [facility]   Hunger Vital Sign    Within the past 12 months, you worried that your food would run out before you got the money to buy more.: Never true    Within the past 12 months, the food you bought just didn't last and you didn't have money to get more.: Never true  Transportation Needs: No Transportation Needs (06/12/2023)   Received from [facility]   PRAPARE - Transportation    Lack of Transportation (Medical): No    Lack of Transportation (Non-Medical): No  Physical Activity: Inactive (09/27/2021)   Received from [facility]   Exercise Vital Sign    On average, how many days per week do you engage in moderate to strenuous exercise (like a brisk walk)?: 0 days  On average, how many minutes do you engage in exercise at this level?: 0 min  Stress: No Stress Concern Present (09/27/2021)   Received from [facility] - Occupational Stress Questionnaire    Feeling of Stress : Not at all  Social Connections: Moderately Isolated (09/27/2021)   Received from [facility]   Social Connection and Isolation Panel    In a typical week, how many times do you talk on the phone with family, friends, or neighbors?: More than three times a week    How often do you get together with friends or relatives?: Once a week    How often do you attend church or religious services?: Never    Do you belong to any clubs or organizations such as church groups, unions, fraternal or athletic groups, or school groups?: No    How often do you attend meetings of the clubs or organizations you belong to?: Never    Are you married, widowed, divorced, separated, never married, or living with a partner?: Married   Intimate Partner Violence: Not At Risk (03/15/2022)   Humiliation, Afraid, Rape, and Kick questionnaire    Fear of Current or Ex-Partner: No    Emotionally Abused: No    Physically Abused: No    Sexually Abused: No    Family History  Problem Relation Age of Onset   Heart attack Father 40   Heart attack Brother 59    Current Outpatient Medications  Medication Sig Dispense Refill   aspirin  EC 81 MG tablet Take 81 mg by mouth daily. Swallow whole.     atorvastatin  (LIPITOR ) 80 MG tablet Take 1 tablet (80 mg total) by mouth daily. 90 tablet 0   clopidogrel  (PLAVIX ) 75 MG tablet Take 1 tablet (75 mg total) by mouth daily. 30 tablet 2   ezetimibe  (ZETIA ) 10 MG tablet Take 1 tablet (10 mg total) by mouth daily. 30 tablet 11   metoprolol  succinate (TOPROL -XL) 25 MG 24 hr tablet Take 1 tablet (25 mg total) by mouth daily. 90 tablet 0   nitroGLYCERIN  (NITROSTAT ) 0.4 MG SL tablet Place 0.4 mg under the tongue every 5 (five) minutes as needed for chest pain.     tiZANidine  (ZANAFLEX ) 4 MG tablet Take 1 tablet (4 mg total) by mouth at bedtime. 30 tablet 2   traMADol  (ULTRAM ) 50 MG tablet Take 1 tablet (50 mg total) by mouth every 12 (twelve) hours as needed for severe pain (pain score 7-10). 30 tablet 2   No current facility-administered medications for this visit.    Allergies  Allergen Reactions   Suboxone [Buprenorphine Hcl-Naloxone Hcl] Other (See Comments)    Caused kidney failure   Benadryl [Diphenhydramine] Other (See Comments)    LEG CRAMPING   Seroquel [Quetiapine] Other (See Comments)    makes my legs cramp     REVIEW OF SYSTEMS:  Negative unless noted in HPI [X]  denotes positive finding, [ ]  denotes negative finding Cardiac  Comments:  Chest pain or chest pressure:    Shortness of breath upon exertion:    Short of breath when lying flat:    Irregular heart rhythm:        Vascular    Pain in calf, thigh, or hip brought on by ambulation:    Pain in feet at night that  wakes you up from your sleep:     Blood clot in your veins:    Leg swelling:  Pulmonary    Oxygen at home:    Productive cough:     Wheezing:         Neurologic    Sudden weakness in arms or legs:     Sudden numbness in arms or legs:     Sudden onset of difficulty speaking or slurred speech:    Temporary loss of vision in one eye:     Problems with dizziness:         Gastrointestinal    Blood in stool:     Vomited blood:         Genitourinary    Burning when urinating:     Blood in urine:        Psychiatric    Major depression:         Hematologic    Bleeding problems:    Problems with blood clotting too easily:        Skin    Rashes or ulcers:        Constitutional    Fever or chills:      PHYSICAL EXAMINATION:  Vitals:   09/19/23 0955  BP: (!) 140/87  Pulse: 66  Temp: 97.8 F (36.6 C)  TempSrc: Temporal  Weight: 176 lb 1.6 oz (79.9 kg)    General:  WDWN in NAD; vital signs documented above Gait: Not observed HENT: WNL, normocephalic Pulmonary: normal non-labored breathing Cardiac: regular HR Abdomen: soft, NT, no masses Skin: without rashes Vascular Exam/Pulses: palpable R PT pulse; palpable L femoral without hematoma  Extremities: without ischemic changes, without Gangrene , without cellulitis; without open wounds;  Musculoskeletal: no muscle wasting or atrophy  Neurologic: A&O X 3 Psychiatric:  The pt has Normal affect.   Non-Invasive Vascular Imaging:   Widely patent right common and external iliac stents  Patent SFA after intervention  ABI/TBIToday's ABIToday's TBIPrevious ABIPrevious TBI  +-------+-----------+-----------+------------+------------+  Right 0.9        0.8        0.84        0.61          +-------+-----------+-----------+------------+------------+  Left  1.01       0.81       0.99        0.78         ASSESSMENT/PLAN:: 53 y.o. male status post aortogram with right common iliac artery stenting,  laser atherectomy and drug-coated balloon angioplasty of the right external iliac artery in-stent stenosis, and laser atherectomy and drug-coated balloon angioplasty of the right SFA  Subjectively still having mild claudication symptoms in the right calf however symptoms are much improved after his procedure.  Duplex demonstrates widely patent right iliac system and SFA after intervention.  His TBI and ABI have improved postoperatively.  He has a palpable PT pulse on exam.  He will continue his aspirin , Plavix , statin daily.  I strongly encouraged smoking cessation to improve durability of intervention.  We will repeat imaging studies in 6 months.   Donnice Sender, PA-C [facility] [phone]  Clinic MD:   Sheree

## 2023-09-21 ENCOUNTER — Other Ambulatory Visit: Payer: Self-pay

## 2023-09-21 DIAGNOSIS — I70211 Atherosclerosis of native arteries of extremities with intermittent claudication, right leg: Secondary | ICD-10-CM

## 2023-09-26 ENCOUNTER — Ambulatory Visit (INDEPENDENT_AMBULATORY_CARE_PROVIDER_SITE_OTHER): Payer: MEDICAID

## 2023-09-26 VITALS — BP 142/86 | HR 71 | Ht 66.0 in | Wt 173.0 lb

## 2023-09-26 DIAGNOSIS — G4700 Insomnia, unspecified: Secondary | ICD-10-CM

## 2023-09-26 DIAGNOSIS — I739 Peripheral vascular disease, unspecified: Secondary | ICD-10-CM | POA: Diagnosis not present

## 2023-09-26 DIAGNOSIS — F4310 Post-traumatic stress disorder, unspecified: Secondary | ICD-10-CM | POA: Diagnosis not present

## 2023-09-26 MED ORDER — MIRTAZAPINE 15 MG PO TBDP: 15.0000 mg | ORAL_TABLET | Freq: Every day | ORAL | 3 refills | Status: AC

## 2023-09-26 MED ORDER — SERTRALINE HCL 50 MG PO TABS: 50.0000 mg | ORAL_TABLET | Freq: Every day | ORAL | 3 refills | Status: AC

## 2023-09-26 NOTE — Progress Notes (Signed)
 Established Patient Office Visit  Subjective   Patient ID: Don Fletcher, male    DOB: December 09, 1970  Age: 53 y.o. MRN: [id number]  Chief Complaint  Patient presents with   Medical Management of Chronic Issues    3 Month follow up    HPI  Discussed the use of AI scribe software for clinical note transcription with the patient, who gave verbal consent to proceed.  History of Present Illness   Don Fletcher is a 53 year old male with peripheral artery disease who presents with sleep disturbances and leg pain.  Lower extremity pain and paresthesia - Significant pain in the back of the leg persisting for the last couple of years - History of peripheral artery disease with prior intervention including one stent and two balloons, resulting in improved walking ability - Paresthesia described as 'a thousand needles going in my feet' when standing in the shower  Sleep disturbance - Difficulty initiating and maintaining sleep, described as 'turning his brain off at night' and having a 'bad problem sleeping' - Takes tizanidine  at night, which induces sleepiness but does not maintain sleep throughout the night - Recurrent intrusive thoughts related to a traumatic event witnessed at age 80 or 54, involving a friend's death, which continues to replay in his mind and contributes to sleep issues - History of psychiatric care a couple of years ago, discontinued due to poor relationship with provider  Appetite loss and weight reduction - Decreased appetite, attributed to nerves - Significant weight loss from nearly 200 pounds to current weight (exact current weight not specified) - Reports eating very little daily  Tobacco use - History of smoking - Currently attempting to reduce smoking by chewing on cigarettes instead of smoking them when outside        ROS    Objective:     BP (!) 142/86   Pulse 71   Ht 5' 6 (1.676 m)   Wt 173 lb (78.5 kg)   SpO2 94%   BMI 27.92 kg/m  BP  Readings from Last 3 Encounters:  09/26/23 (!) 142/86  09/19/23 (!) 140/87  08/20/23 124/87   Wt Readings from Last 3 Encounters:  09/26/23 173 lb (78.5 kg)  09/19/23 176 lb 1.6 oz (79.9 kg)  08/20/23 175 lb (79.4 kg)     Physical Exam Vitals and nursing note reviewed.  Constitutional:      Appearance: Normal appearance.  HENT:     Head: Normocephalic.  Eyes:     Extraocular Movements: Extraocular movements intact.     Pupils: Pupils are equal, round, and reactive to light.  Cardiovascular:     Rate and Rhythm: Normal rate and regular rhythm.  Pulmonary:     Effort: Pulmonary effort is normal.     Breath sounds: Normal breath sounds.  Musculoskeletal:     Cervical back: Normal range of motion and neck supple.  Neurological:     Mental Status: He is alert and oriented to person, place, and time.  Psychiatric:        Mood and Affect: Mood normal.        Thought Content: Thought content normal.      No results found for any visits on 09/26/23.    The ASCVD Risk score (Arnett DK, et al., 2019) failed to calculate for the following reasons:   Risk score cannot be calculated because patient has a medical history suggesting prior/existing ASCVD    Assessment & Plan:   Problem List Items  Addressed This Visit       Cardiovascular and Mediastinum   PAD (peripheral artery disease) (HCC) (Chronic)     Other   Insomnia   Relevant Medications   mirtazapine  (REMERON  SOL-TAB) 15 MG disintegrating tablet   Other Visit Diagnoses       PTSD (post-traumatic stress disorder)    -  Primary   Relevant Medications   sertraline  (ZOLOFT ) 50 MG tablet   mirtazapine  (REMERON  SOL-TAB) 15 MG disintegrating tablet      Assessment and Plan    Peripheral artery disease Peripheral artery disease with previous stent placement and balloon angioplasty.  Paresthesia of feet Paresthesia in feet likely related to circulation issues.  Insomnia and depression Chronic insomnia and  depression. Previous tizanidine  ineffective. Traumatic events contribute to mental health issues. Zoloft  trial incomplete due to psychiatrist relationship. - Prescribed Zoloft  for daily use to address anxiety and depression. - Prescribed medication for bedtime to aid sleep.  Decreased appetite Decreased appetite possibly related to depression and anxiety. Significant weight loss noted. - Prescribed Zoloft  to potentially improve appetite and mood.  Tobacco use Chronic tobacco use with attempts to reduce smoking. - Encouraged reduction in smoking.  Lipoma, abdominal wall Abdominal wall lipoma identified, non-tender and not causing discomfort. Examination suggests lipoma over hernia. - Monitor lipoma for changes.       Return in about 3 months (around 12/27/2023) for chronic follow-up with PCP.    Leita Longs, FNP

## 2023-09-30 NOTE — Assessment & Plan Note (Signed)
 Insomnia and depression Chronic insomnia and depression. Previous tizanidine  ineffective. Traumatic events contribute to mental health issues. Zoloft  trial incomplete due to psychiatrist relationship. - Prescribed Zoloft  for daily use to address anxiety and depression. - Prescribed medication for bedtime to aid sleep.

## 2023-09-30 NOTE — Assessment & Plan Note (Signed)
 Peripheral artery disease Peripheral artery disease with previous stent placement and balloon angioplasty.  Paresthesia of feet Paresthesia in feet likely related to circulation issues.

## 2023-10-18 ENCOUNTER — Ambulatory Visit: Payer: MEDICAID | Attending: Cardiology | Admitting: Cardiology

## 2023-10-18 ENCOUNTER — Encounter: Payer: Self-pay | Admitting: Cardiology

## 2023-10-18 VITALS — BP 140/82 | HR 72 | Ht 66.0 in | Wt 171.2 lb

## 2023-10-18 DIAGNOSIS — E785 Hyperlipidemia, unspecified: Secondary | ICD-10-CM | POA: Insufficient documentation

## 2023-10-18 DIAGNOSIS — E782 Mixed hyperlipidemia: Secondary | ICD-10-CM | POA: Diagnosis present

## 2023-10-18 DIAGNOSIS — Z8249 Family history of ischemic heart disease and other diseases of the circulatory system: Secondary | ICD-10-CM | POA: Insufficient documentation

## 2023-10-18 DIAGNOSIS — I2510 Atherosclerotic heart disease of native coronary artery without angina pectoris: Secondary | ICD-10-CM | POA: Diagnosis present

## 2023-10-18 MED ORDER — LOSARTAN POTASSIUM 25 MG PO TABS: 25.0000 mg | ORAL_TABLET | Freq: Every day | ORAL | 3 refills | Status: AC

## 2023-10-18 NOTE — Progress Notes (Unsigned)
 Clinical Summary Don Fletcher is a 53 y.o.male 1. CAD - pt admitted 12/16/13 with inferolateral STEMI, received BMS to proximal RPL. Full cath report below. LVEF 55% by LVgram, LVEF 50-55% by echo.    - on DAPT with ASA and effient , initially on brillinta but had some SOB and was changed while in the hospital   - Lexiscan  MPI after cath in setting of chest pain Jan 2016 with inferior scar with mild to moderate ischemia, LVEF 60%. Overall low risk. Managed medically - admit 07/2014 with chest pain, cath showed occlusion in the PLA with left to right collateralization, managed medically.   -Admit 05/2016 with STEMI, received DES to LCX. LVgram 35-45% at taht time.  - patient left AMA shortly after procedure, did not have echo done.      12/2021 cath: LM patent, prox to mid LAD 35%, LCX patent stent, RCA prox to mid 50%. Stable anatomy.    06/2023 admit [facility] with NSTEMI - 06/2023 cath: LM 10%, LAD diffuse disease, LCX patent, distal LPL Don Fletcher occluded which looks to be culprit and too small for PCI. OM2 stents are patent. RCA 70-80% did not appear to be culprit vessel, distal PL territory supplied by LCX collaterals.  - Consider PCI to RCA and/or LAD if symptoms persist on medical therapy   -06/2023 echo [facility]: LVEF 45-50%. There is decreased contractile function involving the basal and mid  inferior/inferolateral and anterolateral segments  - no recent chest pains     2. COPD - followed by Dr Vonzell.    3. Depression - follow by psychiatry     4. Vertigo/Dizziness - dizzienss with head position changes. Can occur with closing eyes and turning head to side. Can have some issues with sinus congestion.  - also can occur with standing. - drinks gallon of tea daily, nothing else.      5.PAD - followed by vascular - s/p R EIA stent and angioplasty ot R SFA  08/2023 -Laser atherectomy and 4 mm drug coated balloon angioplasty right SFA with Ranger balloon and 1.7 mm Auryon  laser -Laser atherectomy and 6 mm drug-coated balloon angioplasty right external iliac artery in-stent restenosis with Ranger balloon and 1.7 mm Auryon laser  Stent of right common iliac artery with 7 x 39 mm VBX    6. HTN - has not taken meds yet today.   7. HLD - 06/2023 TC 226 LDL 174 TG 111 - admit some noncompliance  Past Medical History:  Diagnosis Date   Arthritis    Bipolar 1 disorder (HCC)    CAD (coronary artery disease)    a. 12/16/13 inferolat STEMI s/p BMS to RPL. b. Cath 08/2014: interim occlusion of the PLA stent of the RCA, collateralized from the left coronary artery with diffuse nonobstructive CAD;  c. STEMI/PCI: LM nl, LAD nl, LCX 17m/d (2.75x12 synergy DES), RCA 30p, RPDA 75ost/p, RPAV 100 w/ L->R collats.   Chronic back pain    GERD (gastroesophageal reflux disease)    HLD (hyperlipidemia)    Hypertension    Ischemic cardiomyopathy    a. 12/2013: EF 50-55% by echo with +WMA. b. EF 45% by cath in 08/2014.   PAD (peripheral artery disease) (HCC)    s/p drug-coated balloon angioplasty right SFA, angioplasty/DES right EIA 09/09/21   Peptic ulcer disease    Schizophrenic disorder (HCC)    Tobacco abuse      Allergies  Allergen Reactions   Suboxone [Buprenorphine Hcl-Naloxone Hcl] Other (See  Comments)    Caused kidney failure   Benadryl [Diphenhydramine] Other (See Comments)    LEG CRAMPING   Seroquel [Quetiapine] Other (See Comments)    makes my legs cramp     Current Outpatient Medications  Medication Sig Dispense Refill   aspirin  EC 81 MG tablet Take 81 mg by mouth daily. Swallow whole.     atorvastatin  (LIPITOR ) 80 MG tablet Take 1 tablet (80 mg total) by mouth daily. 90 tablet 0   clopidogrel  (PLAVIX ) 75 MG tablet Take 1 tablet (75 mg total) by mouth daily. 30 tablet 2   ezetimibe  (ZETIA ) 10 MG tablet Take 1 tablet (10 mg total) by mouth daily. 30 tablet 11   metoprolol  succinate (TOPROL -XL) 25 MG 24 hr tablet Take 1 tablet (25 mg total) by mouth  daily. 90 tablet 0   mirtazapine  (REMERON  SOL-TAB) 15 MG disintegrating tablet Take 1 tablet (15 mg total) by mouth at bedtime. 30 tablet 3   nitroGLYCERIN  (NITROSTAT ) 0.4 MG SL tablet Place 0.4 mg under the tongue every 5 (five) minutes as needed for chest pain.     sertraline  (ZOLOFT ) 50 MG tablet Take 1 tablet (50 mg total) by mouth daily. 30 tablet 3   traMADol  (ULTRAM ) 50 MG tablet Take 1 tablet (50 mg total) by mouth every 12 (twelve) hours as needed for severe pain (pain score 7-10). 30 tablet 2   No current facility-administered medications for this visit.     Past Surgical History:  Procedure Laterality Date   ABDOMINAL AORTOGRAM N/A 08/20/2023   Procedure: ABDOMINAL AORTOGRAM;  Surgeon: Sheree Penne Bruckner, MD;  Location: [facility] INVASIVE CV LAB;  Service: Cardiovascular;  Laterality: N/A;   ABDOMINAL AORTOGRAM W/LOWER EXTREMITY Bilateral 09/09/2021   Procedure: ABDOMINAL AORTOGRAM W/LOWER EXTREMITY;  Surgeon: Eliza Bruckner RAMAN, MD;  Location: [facility] INVASIVE CV LAB;  Service: Cardiovascular;  Laterality: Bilateral;   CARDIAC CATHETERIZATION N/A 08/07/2014   Procedure: Left Heart Cath and Coronary Angiography;  Surgeon: Ozell Fell, MD;  Location: [facility] INVASIVE CV LAB;  Service: Cardiovascular;  Laterality: N/A;   COLONOSCOPY N/A 06/06/2017   Procedure: COLONOSCOPY;  Surgeon: Golda Claudis PENNER, MD;  Location: [facility] ENDO SUITE;  Service: Endoscopy;  Laterality: N/A;  10:50   CORONARY STENT INTERVENTION N/A 05/19/2016   Procedure: Coronary Stent Intervention;  Surgeon: Dorn JINNY Lesches, MD;  Location: [facility] INVASIVE CV LAB;  Service: Cardiovascular;  Laterality: N/A;   CORONARY STENT INTERVENTION N/A 07/02/2018   Procedure: CORONARY STENT INTERVENTION;  Surgeon: Claudene Victory ORN, MD;  Location: [facility] INVASIVE CV LAB;  Service: Cardiovascular;  Laterality: N/A;   HERNIA REPAIR     LEFT HEART CATH AND CORONARY ANGIOGRAPHY N/A 05/19/2016   Procedure: Left Heart Cath and Coronary Angiography;   Surgeon: Dorn JINNY Lesches, MD;  Location: [facility] INVASIVE CV LAB;  Service: Cardiovascular;  Laterality: N/A;   LEFT HEART CATH AND CORONARY ANGIOGRAPHY N/A 07/02/2018   Procedure: LEFT HEART CATH AND CORONARY ANGIOGRAPHY;  Surgeon: Claudene Victory ORN, MD;  Location: [facility] INVASIVE CV LAB;  Service: Cardiovascular;  Laterality: N/A;   LEFT HEART CATH AND CORONARY ANGIOGRAPHY N/A 12/21/2021   Procedure: LEFT HEART CATH AND CORONARY ANGIOGRAPHY;  Surgeon: Anner Alm ORN, MD;  Location: [facility] INVASIVE CV LAB;  Service: Cardiovascular;  Laterality: N/A;   LEFT HEART CATHETERIZATION WITH CORONARY ANGIOGRAM N/A 12/16/2013   Procedure: LEFT HEART CATHETERIZATION WITH CORONARY ANGIOGRAM;  Surgeon: Candyce RAMAN Reek, MD;  Location: [facility] CATH LAB;  Service: Cardiovascular;  Laterality: N/A;   LOWER EXTREMITY  ANGIOGRAPHY Right 08/20/2023   Procedure: Lower Extremity Angiography;  Surgeon: Sheree Penne Bruckner, MD;  Location: [facility] INVASIVE CV LAB;  Service: Cardiovascular;  Laterality: Right;   LOWER EXTREMITY INTERVENTION Right 08/20/2023   Procedure: LOWER EXTREMITY INTERVENTION;  Surgeon: Sheree Penne Bruckner, MD;  Location: [facility] INVASIVE CV LAB;  Service: Cardiovascular;  Laterality: Right;   PERCUTANEOUS CORONARY STENT INTERVENTION (PCI-S)  12/16/2013   Procedure: PERCUTANEOUS CORONARY STENT INTERVENTION (PCI-S);  Surgeon: Candyce GORMAN Reek, MD;  Location: [facility] CATH LAB;  Service: Cardiovascular;;  distal rca   PERIPHERAL VASCULAR BALLOON ANGIOPLASTY  09/09/2021   Procedure: PERIPHERAL VASCULAR BALLOON ANGIOPLASTY;  Surgeon: Eliza Bruckner GORMAN, MD;  Location: [facility] INVASIVE CV LAB;  Service: Cardiovascular;;  Rt SFA   PERIPHERAL VASCULAR INTERVENTION  09/09/2021   Procedure: PERIPHERAL VASCULAR INTERVENTION;  Surgeon: Eliza Bruckner GORMAN, MD;  Location: [facility] INVASIVE CV LAB;  Service: Cardiovascular;;  Rt Iliac   TONSILLECTOMY     TOTAL HIP ARTHROPLASTY Right 03/15/2022   Procedure: RIGHT TOTAL HIP ARTHROPLASTY  ANTERIOR APPROACH;  Surgeon: Barbarann Oneil BROCKS, MD;  Location: [facility] OR;  Service: Orthopedics;  Laterality: Right;  Needs RNFA     Allergies  Allergen Reactions   Suboxone [Buprenorphine Hcl-Naloxone Hcl] Other (See Comments)    Caused kidney failure   Benadryl [Diphenhydramine] Other (See Comments)    LEG CRAMPING   Seroquel [Quetiapine] Other (See Comments)    makes my legs cramp      Family History  Problem Relation Age of Onset   Heart attack Father 81   Heart attack Brother 59     Social History Mr. Kristiansen reports that he has been smoking cigarettes and cigars. He started smoking about 35 years ago. He has a 35.4 pack-year smoking history. He quit smokeless tobacco use about 35 years ago.  His smokeless tobacco use included snuff. Mr. Hunsucker reports no history of alcohol use.    Physical Examination Today's Vitals   10/18/23 1535 10/18/23 1538 10/18/23 1639  BP: (!) 142/84 (!) 142/84 (!) 140/82  Pulse: 72    SpO2: 99%    Weight: 171 lb 3.2 oz (77.7 kg)    Height: 5' 6 (1.676 m)     Body mass index is 27.63 kg/m.  Gen: resting comfortably, no acute distress HEENT: no scleral icterus, pupils equal round and reactive, no palptable cervical adenopathy,  CV: RRR, no mrg, no jvd Resp: Clear to auscultation bilaterally GI: abdomen is soft, non-tender, non-distended, normal bowel sounds, no hepatosplenomegaly MSK: extremities are warm, no edema.  Skin: warm, no rash Neuro:  no focal deficits Psych: appropriate affect   Diagnostic Studies 12/2013 Echo Study Conclusions  - Left ventricle: The cavity size was normal. Wall thickness was   normal. Systolic function was normal. The estimated ejection   fraction was in the range of 50% to 55%. There is akinesis of the   basal-midinferior myocardium. There is akinesis of the   basalinferolateral myocardium. Left ventricular diastolic   function parameters were normal. - Aortic valve: Mildly calcified annulus.  Trileaflet. - Mitral valve: Mildly thickened leaflets . There was trivial   regurgitation. - Right atrium: Central venous pressure (est): 3 mm Hg. - Atrial septum: No defect or patent foramen ovale was identified. - Tricuspid valve: There was trivial regurgitation. - Pulmonary arteries: Systolic pressure could not be accurately   estimated. - Pericardium, extracardiac: There was no pericardial effusion.  Impressions:  - Normal LV wall thickness and chamber size with LVEF 50-55%, wall  motion abnormalities as outlined above consistent with ischemic   heart disease, normal diastolic function. Mildly thickened mitral   leaflets with trivial mitral regurgitation. Trivial tricuspid   regurgitation, unable to assess PASP.   12/16/13 Cath HEMODYNAMICS:  Aortic pressure was 91/60; LV pressure was 91/9; LVEDP 17.  There was no gradient between the left ventricle and aorta.    ANGIOGRAPHIC DATA:   The left main coronary artery has mild disease. The left anterior descending artery is a large vessel proximally. In the mid vessel, there is mild, diffuse disease. There are several medium-size diagonals which are widely patent. The left circumflex artery is large vessel. There is mild disease in the proximal portion. The OM1 is a large vessel. There is mild to moderate disease in the mid vessel, up to 40%. The right coronary artery is a large, dominant vessel. In the proximal portion, there is a moderate stenosis of up to 50%. The posterior descending artery is a large vessel and reaches the apex and is widely patent. The posterior lateral artery is occluded proximally, just past the ostium.  After opening the vessel of the balloon, it was noted that the disease extended into the mid posterior lateral artery. The posterior lateral artery was small caliber. LEFT VENTRICULOGRAM:  Left ventricular angiogram was done in the 30 RAO projection and revealed mild, basal inferior hypokinesis with normal systolic  function with an estimated ejection fraction of 55%.  LVEDP was 17 mmHg. PCI NARRATIVE: A JR4 guiding catheter used to engage the RCA. Angiomax  was used for anticoagulation. ACT was used to check that the Angiomax  was therapeutic. A pro-water  wire was placed across the area disease in the posterior lateral artery. An aspiration catheter was advanced to the ostium of the posterior lateral artery. Aspiration thrombectomy was performed with some improvement in flow. A 2.0 x 12 balloon was then used to predilate patient. The vessel was fairly small in caliber. Since the patient admits to prior bleeding issues as well as frequently forgetting to take his medications, we opted for a bare-metal stent. A 2.0 x 23 vision bare metal stent was deployed. The proximalstent was postdilated  with a 2.25 by 12 noncompliant balloon. There was an excellent angiographic result of the proximal stent and the bifurcation with the posterior descending artery. There was a stepdown at the distal edge of the stent. TIMI-3 flow was restored. The patient's pain was significantly improved. IMPRESSIONS: Widely patent left main coronary artery. Mild disease in the left anterior descending artery and its branches. Mild disease in the left circumflex artery and its branches. Moderate disease in the proximal right coronary artery.occluded posterior lateral artery which was the culprit for the patient's presentation. This was successfully treated with a 2.0 x 23 vision bare metal stent, postdilated to 2.25 mm in diameter. Normal left ventricular systolic function.  LVEDP 17 mmHg.  Ejection fraction 55%. RECOMMENDATION:  Continue dual antiplatelet therapy for at least 30 days and likely longer. A bare-metal stent was chosen because the patient admits to forgetting to take his medication fairly frequently. He also stated that his wife does not remember to take medicines as well. Continue aggressive secondary prevention. He'll be watched in the  ICU. Continue Angiomax  at the reduced rate until the bag runs out. He needs to stop smoking.  Significant family h/o CAD.  Start beta blocker.   Start statin. Nicotine  patch.  Consider f/u in [facility] if that is easier for him to be compliant.  Jan 2016 Lexiscan  MPI IMPRESSION: 1. Region of a mixed scar and small area of mild to moderate peri-infarct ischemia noted in the inferior wall as outlined. Cannot exclude a minor region of mid anterior ischemia as well.   2. Normal left ventricular wall motion.   3. Left ventricular ejection fraction 60%   4. Overall low-risk stress test findings*.   08/2014 cath FINAL CONCLUSIONS: SINGLE VESSEL CAD WITH OCCLUSION OF THE PLA STENT IN THE RIGHT CORONARY, COLLATERALIZED FROM THE LEFT CORONARY ARTERY DIFFUSE NONOBSTRUCTIVE CAD MILD SEGMENTAL LV DYSFUNCTION WITH INFERIOR AKINESIS AND ESTIMATED LVEF 45%   RECOMMENDATIONS: MEDICAL THERAPY OK FOR DISCHARGE HOME TODAY     05/2016 cath Prox RCA lesion, 30 %stenosed. Post Atrio lesion, 100 %stenosed. Ost RPDA to RPDA lesion, 75 %stenosed. Mid Cx to Dist Cx lesion, 100 %stenosed. Post intervention, there is a 0% residual stenosis. A stent was successfully placed. There is moderate to severe left ventricular systolic dysfunction. LV end diastolic pressure is mildly elevated. The left ventricular ejection fraction is 35-45% by visual estimate.              Assessment and Plan  1. CAD/Chronic systolic HF -no recent symptoms, continue current med - currently on DAPT due to recent leg intervention. Also medically managed NSTEMI 06/2023.    2. HTN -bp above goal, start losartan  25mg  daily. Check bmet 2 weeks   3. Hyperlipidemia -repeat lipid panel         Dorn PHEBE Ross, M.D

## 2023-10-18 NOTE — Patient Instructions (Signed)
 Medication Instructions:  Your physician has recommended you make the following change in your medication:  Start taking Losartan  25 mg once daily Continue taking all other medications as prescribed   Labwork: BMET and Fasting Lipid Panel in 2 weeks at [facility]  Testing/Procedures: None  Follow-Up: Your physician recommends that you schedule a follow-up appointment in: 6 months  Any Other Special Instructions Will Be Listed Below (If Applicable). Thank you for choosing [facility]!     If you need a refill on your cardiac medications before your next appointment, please call your pharmacy.

## 2023-10-30 ENCOUNTER — Other Ambulatory Visit: Payer: Self-pay | Admitting: Internal Medicine

## 2023-11-12 ENCOUNTER — Ambulatory Visit (HOSPITAL_COMMUNITY)
Admission: RE | Admit: 2023-11-12 | Discharge: 2023-11-12 | Disposition: A | Discharge status: Home / Self Care | Payer: MEDICAID | Source: Ambulatory Visit | Attending: Family Medicine | Admitting: Family Medicine

## 2023-11-12 ENCOUNTER — Ambulatory Visit: Payer: Self-pay | Admitting: Family Medicine

## 2023-11-12 ENCOUNTER — Ambulatory Visit (INDEPENDENT_AMBULATORY_CARE_PROVIDER_SITE_OTHER): Payer: MEDICAID | Admitting: Family Medicine

## 2023-11-12 VITALS — BP 121/73 | HR 69 | Ht 66.0 in | Wt 175.0 lb

## 2023-11-12 DIAGNOSIS — R221 Localized swelling, mass and lump, neck: Secondary | ICD-10-CM | POA: Insufficient documentation

## 2023-11-12 MED ORDER — AMOXICILLIN-POT CLAVULANATE 875-125 MG PO TABS: 1.0000 | ORAL_TABLET | Freq: Two times a day (BID) | ORAL | 0 refills | Status: DC

## 2023-11-12 NOTE — Progress Notes (Signed)
 Subjective:  Patient ID: Don Fletcher, male    DOB: 28-Jul-1970  Age: 53 y.o. MRN: [id number]  CC:   Chief Complaint  Patient presents with   Skin Problem    Lump below right ear   HPI:  53 year old male presents for evaluation of the above.  Patient reports a 1.5-week history of an area of tenderness near the angle of the mandible on the right.  Prior to this he had a bout of sore throat which is now resolved.  He states he is also has some gum irritation of the lower gums on the right.  No fever.  No relieving factors.  He is a smoker.  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   Cervical stenosis of spine 01/14/2020   Abnormal CT of the chest 01/01/2020   Syncope and collapse 12/19/2019   NSTEMI (non-ST elevated myocardial infarction) (HCC) 06/30/2018   Family hx of colon cancer 02/14/2017   Ischemic cardiomyopathy 05/19/2016   ST elevation myocardial infarction involving right coronary artery (HCC)    CAD (coronary artery disease)    Tobacco abuse    HLD (hyperlipidemia)    Hypertension    Family history of premature CAD 12/16/2013   CAD S/P PCI:  2.0 x 23 vision bare metal stent (2.25 mm) RPL 12/16/2013    Class: Acute   Schizophrenic disorder (HCC)    Bipolar 1 disorder (HCC)     Social Hx   Social History   Socioeconomic History   Marital status: Married    Spouse name: Not on file   Number of children: Not on file   Years of education: Not on file   Highest education level: Not on  file  Occupational History   Not on file  Tobacco Use   Smoking status: Every Day    Current packs/day: 1.00    Average packs/day: 1 pack/day for 35.5 years (35.5 ttl pk-yrs)    Types: Cigarettes, Cigars    Start date: 05/25/1988   Smokeless tobacco: Former    Types: Snuff    Quit date: 05/21/1988  Vaping Use   Vaping status: Never Used  Substance and Sexual Activity   Alcohol use: No    Alcohol/week: 0.0 standard drinks of alcohol   Drug use: Yes    Types: Marijuana    Comment: last used today   Sexual activity: Not on file  Other Topics Concern   Not on file  Social History Narrative   Not on file   Social Drivers of Health   Financial Resource Strain: Low Risk  (06/12/2023)   Received from [facility]   Overall Financial Resource Strain (CARDIA)    Difficulty of Paying Living Expenses: Not very hard  Food Insecurity: No Food Insecurity (06/12/2023)   Received from [facility]   Hunger Vital Sign    Within the past 12 months, you worried that your food would run out before you got the money to buy more.: Never true    Within the past 12 months, the food  you bought just didn't last and you didn't have money to get more.: Never true  Transportation Needs: No Transportation Needs (06/12/2023)   Received from [facility] - Transportation    Lack of Transportation (Medical): No    Lack of Transportation (Non-Medical): No  Physical Activity: Inactive (09/27/2021)   Received from [facility]   Exercise Vital Sign    On average, how many days per week do you engage in moderate to strenuous exercise (like a brisk walk)?: 0 days    On average, how many minutes do you engage in exercise at this level?: 0 min  Stress: No Stress Concern Present (09/27/2021)   Received from [facility] - Occupational Stress Questionnaire    Feeling of Stress : Not at all  Social Connections: Moderately Isolated (09/27/2021)    Received from [facility]   Social Connection and Isolation Panel    In a typical week, how many times do you talk on the phone with family, friends, or neighbors?: More than three times a week    How often do you get together with friends or relatives?: Once a week    How often do you attend church or religious services?: Never    Do you belong to any clubs or organizations such as church groups, unions, fraternal or athletic groups, or school groups?: No    How often do you attend meetings of the clubs or organizations you belong to?: Never    Are you married, widowed, divorced, separated, never married, or living with a partner?: Married    Review of Systems Per HPI  Objective:  BP 121/73   Pulse 69   Ht 5' 6 (1.676 m)   Wt 175 lb (79.4 kg)   BMI 28.25 kg/m      11/12/2023    2:01 PM 10/18/2023    4:39 PM 10/18/2023    3:38 PM  BP/Weight  Systolic BP 121 140 142  Diastolic BP 73 82 84  Wt. (Lbs) 175    BMI 28.25 kg/m2      Physical Exam Vitals and nursing note reviewed.  Constitutional:      Appearance: Normal appearance.  HENT:     Head: Normocephalic and atraumatic.      Comments: There is a small mass at the labeled location.  Tender to palpation. Cardiovascular:     Rate and Rhythm: Normal rate and regular rhythm.  Pulmonary:     Effort: Pulmonary effort is normal.     Breath sounds: Normal breath sounds. No wheezing or rales.  Neurological:     Mental Status: He is alert.     Lab Results  Component Value Date   WBC 10.1 03/16/2022   HGB 16.7 08/20/2023   HCT 49.0 08/20/2023   PLT 215 03/16/2022   GLUCOSE 111 (H) 08/20/2023   CHOL 219 (H) 09/10/2021   TRIG 128 09/10/2021   HDL 25 (L) 09/10/2021   LDLCALC 168 (H) 09/10/2021   ALT 28 07/23/2020   AST 24 07/23/2020   NA 140 08/20/2023   K 4.4 08/20/2023   CL 105 08/20/2023   CREATININE 1.00 08/20/2023   BUN 15 08/20/2023   CO2 24 03/16/2022   TSH 1.310 09/29/2021   INR 4.40 (HH) 05/19/2016    HGBA1C 5.6 09/29/2021     Assessment & Plan:  Mass of neck Assessment & Plan: Concern for infection.  Placing on Augmentin.  Ultrasound for further evaluation.  Orders: -     US  SOFT TISSUE HEAD & NECK (NON-THYROID)  Other orders -     Amoxicillin-Pot Clavulanate; Take 1 tablet by mouth 2 (two) times daily.  Dispense: 20 tablet; Refill: 0    Follow-up:  Pending US   Jacqulyn Ahle DO [facility]

## 2023-11-12 NOTE — Assessment & Plan Note (Signed)
 Concern for infection.  Placing on Augmentin.  Ultrasound for further evaluation.

## 2023-11-12 NOTE — Patient Instructions (Signed)
 Medication as prescribed.  Arranging US .  Take care  Dr. Bluford

## 2023-11-21 ENCOUNTER — Encounter (INDEPENDENT_AMBULATORY_CARE_PROVIDER_SITE_OTHER): Payer: Self-pay | Admitting: Gastroenterology

## 2023-12-13 ENCOUNTER — Other Ambulatory Visit: Payer: Self-pay

## 2023-12-13 DIAGNOSIS — I10 Essential (primary) hypertension: Secondary | ICD-10-CM

## 2024-01-07 ENCOUNTER — Ambulatory Visit: Payer: MEDICAID

## 2024-01-07 VITALS — BP 118/80 | HR 83 | Ht 66.0 in | Wt 175.0 lb

## 2024-01-07 DIAGNOSIS — M25511 Pain in right shoulder: Secondary | ICD-10-CM | POA: Insufficient documentation

## 2024-01-07 MED ORDER — PREDNISONE 10 MG (21) PO TBPK: ORAL_TABLET | ORAL | 0 refills | Status: AC

## 2024-01-07 NOTE — Assessment & Plan Note (Signed)
 Chronic pain with limited range of motion, likely tendinitis or bursitis. Previous x-rays normal. - Prescribed steroid pack. - Referred to orthopedic specialist for evaluation and possible steroid injection. - Advised ice pack twice daily. - Instructed follow-up with orthopedic specialist within a week if no contact.
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
## 2024-01-08 ENCOUNTER — Telehealth: Payer: Self-pay

## 2024-01-08 NOTE — Telephone Encounter (Signed)
 Called Received his voicemail will attempt to call again after lunch

## 2024-01-08 NOTE — Telephone Encounter (Signed)
 Copied from CRM #[id number]. Topic: General - Call Back - No Documentation >> Jan 08, 2024  9:13 AM Carlyon D wrote: Reason for CRM: Pt calling stating he received a call/VM from Leita? Called [facility] they stated she was with pts. Please call pt back if reach out to him today.

## 2024-01-28 ENCOUNTER — Other Ambulatory Visit: Payer: Self-pay

## 2024-02-19 ENCOUNTER — Other Ambulatory Visit (HOSPITAL_COMMUNITY): Payer: Self-pay | Admitting: Orthopedic Surgery

## 2024-02-19 DIAGNOSIS — M25511 Pain in right shoulder: Secondary | ICD-10-CM

## 2024-02-26 ENCOUNTER — Ambulatory Visit (HOSPITAL_COMMUNITY): Payer: MEDICAID

## 2024-02-26 ENCOUNTER — Encounter (HOSPITAL_COMMUNITY): Payer: Self-pay

## 2024-03-19 ENCOUNTER — Ambulatory Visit (HOSPITAL_COMMUNITY): Payer: MEDICAID

## 2024-03-19 ENCOUNTER — Ambulatory Visit: Payer: MEDICAID

## 2024-03-26 ENCOUNTER — Ambulatory Visit: Payer: MEDICAID

## 2024-04-23 ENCOUNTER — Ambulatory Visit: Payer: MEDICAID | Admitting: Cardiology

## 2024-07-08 ENCOUNTER — Ambulatory Visit: Payer: MEDICAID
